# Patient Record
Sex: Female | Born: 1945 | Race: White | Hispanic: No | Smoking: Current every day smoker
Health system: Southern US, Community
[De-identification: ages and names within clinical notes are randomized; demographics above are authoritative.]

## PROBLEM LIST (undated history)

## (undated) DIAGNOSIS — M1712 Unilateral primary osteoarthritis, left knee: Secondary | ICD-10-CM

## (undated) DIAGNOSIS — Z720 Tobacco use: Secondary | ICD-10-CM

## (undated) DIAGNOSIS — I10 Essential (primary) hypertension: Secondary | ICD-10-CM

## (undated) DIAGNOSIS — E7800 Pure hypercholesterolemia, unspecified: Secondary | ICD-10-CM

## (undated) DIAGNOSIS — G8929 Other chronic pain: Secondary | ICD-10-CM

## (undated) DIAGNOSIS — M549 Dorsalgia, unspecified: Secondary | ICD-10-CM

## (undated) DIAGNOSIS — K219 Gastro-esophageal reflux disease without esophagitis: Secondary | ICD-10-CM

## (undated) DIAGNOSIS — E785 Hyperlipidemia, unspecified: Secondary | ICD-10-CM

## (undated) DIAGNOSIS — R002 Palpitations: Secondary | ICD-10-CM

## (undated) DIAGNOSIS — I739 Peripheral vascular disease, unspecified: Secondary | ICD-10-CM

## (undated) HISTORY — DX: Hyperlipidemia, unspecified: E78.5

## (undated) HISTORY — DX: Peripheral vascular disease, unspecified: I73.9

## (undated) HISTORY — DX: Gastro-esophageal reflux disease without esophagitis: K21.9

## (undated) HISTORY — DX: Tobacco use: Z72.0

## (undated) HISTORY — DX: Other chronic pain: G89.29

## (undated) HISTORY — DX: Palpitations: R00.2

## (undated) HISTORY — DX: Unilateral primary osteoarthritis, left knee: M17.12

## (undated) HISTORY — DX: Essential (primary) hypertension: I10

## (undated) HISTORY — DX: Dorsalgia, unspecified: M54.9

## (undated) HISTORY — PX: FEMORAL-POPLITEAL BYPASS GRAFT: SHX937

## (undated) HISTORY — DX: Pure hypercholesterolemia, unspecified: E78.00

---

## 1986-04-01 HISTORY — PX: FINGER TENDON REPAIR: SHX1640

## 1994-04-01 HISTORY — PX: KNEE ARTHROSCOPY: SUR90

## 1996-04-01 HISTORY — PX: LUMBAR FUSION: SHX111

## 1997-09-08 ENCOUNTER — Encounter: Admission: RE | Admit: 1997-09-08 | Discharge: 1997-12-07 | Payer: Self-pay

## 1997-11-17 ENCOUNTER — Ambulatory Visit (HOSPITAL_COMMUNITY): Admission: RE | Admit: 1997-11-17 | Discharge: 1997-11-17 | Payer: Self-pay | Admitting: *Deleted

## 2000-06-11 ENCOUNTER — Encounter: Payer: Self-pay | Admitting: Vascular Surgery

## 2000-06-13 ENCOUNTER — Ambulatory Visit (HOSPITAL_COMMUNITY): Admission: RE | Admit: 2000-06-13 | Discharge: 2000-06-13 | Payer: Self-pay | Admitting: Vascular Surgery

## 2000-06-13 HISTORY — PX: OTHER SURGICAL HISTORY: SHX169

## 2000-06-25 ENCOUNTER — Encounter (INDEPENDENT_AMBULATORY_CARE_PROVIDER_SITE_OTHER): Payer: Self-pay | Admitting: Specialist

## 2000-06-25 ENCOUNTER — Inpatient Hospital Stay (HOSPITAL_COMMUNITY): Admission: RE | Admit: 2000-06-25 | Discharge: 2000-06-29 | Payer: Self-pay | Admitting: Vascular Surgery

## 2000-06-25 ENCOUNTER — Encounter: Payer: Self-pay | Admitting: Vascular Surgery

## 2000-06-26 ENCOUNTER — Encounter: Payer: Self-pay | Admitting: Vascular Surgery

## 2001-08-13 ENCOUNTER — Ambulatory Visit (HOSPITAL_COMMUNITY): Admission: RE | Admit: 2001-08-13 | Discharge: 2001-08-13 | Payer: Self-pay | Admitting: Orthopaedic Surgery

## 2003-09-19 ENCOUNTER — Ambulatory Visit (HOSPITAL_COMMUNITY): Admission: RE | Admit: 2003-09-19 | Discharge: 2003-09-19 | Payer: Self-pay | Admitting: Orthopaedic Surgery

## 2003-10-10 ENCOUNTER — Encounter: Admission: RE | Admit: 2003-10-10 | Discharge: 2003-10-10 | Payer: Self-pay | Admitting: Orthopaedic Surgery

## 2003-10-14 ENCOUNTER — Ambulatory Visit (HOSPITAL_COMMUNITY): Admission: RE | Admit: 2003-10-14 | Discharge: 2003-10-14 | Payer: Self-pay | Admitting: Orthopaedic Surgery

## 2003-12-19 ENCOUNTER — Ambulatory Visit (HOSPITAL_COMMUNITY): Admission: RE | Admit: 2003-12-19 | Discharge: 2003-12-20 | Payer: Self-pay | Admitting: Orthopaedic Surgery

## 2004-03-05 ENCOUNTER — Ambulatory Visit (HOSPITAL_COMMUNITY): Admission: RE | Admit: 2004-03-05 | Discharge: 2004-03-05 | Payer: Self-pay | Admitting: Orthopaedic Surgery

## 2004-03-20 ENCOUNTER — Ambulatory Visit (HOSPITAL_COMMUNITY): Admission: RE | Admit: 2004-03-20 | Discharge: 2004-03-20 | Payer: Self-pay | Admitting: Orthopaedic Surgery

## 2004-03-30 ENCOUNTER — Inpatient Hospital Stay (HOSPITAL_COMMUNITY): Admission: RE | Admit: 2004-03-30 | Discharge: 2004-04-02 | Payer: Self-pay | Admitting: Orthopaedic Surgery

## 2004-04-30 ENCOUNTER — Ambulatory Visit: Payer: Self-pay | Admitting: Family Medicine

## 2004-05-31 ENCOUNTER — Encounter: Admission: RE | Admit: 2004-05-31 | Discharge: 2004-06-07 | Payer: Self-pay | Admitting: Orthopaedic Surgery

## 2004-10-10 ENCOUNTER — Ambulatory Visit: Payer: Self-pay | Admitting: Family Medicine

## 2005-02-11 ENCOUNTER — Ambulatory Visit: Payer: Self-pay | Admitting: Family Medicine

## 2005-05-22 ENCOUNTER — Ambulatory Visit: Payer: Self-pay | Admitting: Cardiology

## 2005-06-11 ENCOUNTER — Ambulatory Visit: Payer: Self-pay | Admitting: Family Medicine

## 2005-07-24 ENCOUNTER — Ambulatory Visit: Payer: Self-pay | Admitting: Family Medicine

## 2005-12-09 ENCOUNTER — Ambulatory Visit: Payer: Self-pay | Admitting: Family Medicine

## 2006-02-25 ENCOUNTER — Encounter: Admission: RE | Admit: 2006-02-25 | Discharge: 2006-02-25 | Payer: Self-pay | Admitting: Orthopaedic Surgery

## 2006-03-31 ENCOUNTER — Encounter: Admission: RE | Admit: 2006-03-31 | Discharge: 2006-03-31 | Payer: Self-pay | Admitting: *Deleted

## 2006-04-21 ENCOUNTER — Encounter: Admission: RE | Admit: 2006-04-21 | Discharge: 2006-04-21 | Payer: Self-pay | Admitting: Orthopaedic Surgery

## 2007-09-07 ENCOUNTER — Encounter: Admission: RE | Admit: 2007-09-07 | Discharge: 2007-09-07 | Payer: Self-pay | Admitting: Orthopaedic Surgery

## 2008-02-05 ENCOUNTER — Inpatient Hospital Stay (HOSPITAL_COMMUNITY): Admission: RE | Admit: 2008-02-05 | Discharge: 2008-02-08 | Payer: Self-pay | Admitting: Orthopaedic Surgery

## 2008-02-23 ENCOUNTER — Encounter: Admission: RE | Admit: 2008-02-23 | Discharge: 2008-03-31 | Payer: Self-pay | Admitting: Orthopaedic Surgery

## 2009-05-16 ENCOUNTER — Encounter: Payer: Self-pay | Admitting: Cardiology

## 2009-05-16 ENCOUNTER — Ambulatory Visit: Payer: Self-pay | Admitting: Cardiology

## 2009-05-16 ENCOUNTER — Inpatient Hospital Stay (HOSPITAL_COMMUNITY): Admission: EM | Admit: 2009-05-16 | Discharge: 2009-05-17 | Payer: Self-pay | Admitting: Emergency Medicine

## 2009-05-23 ENCOUNTER — Encounter: Payer: Self-pay | Admitting: Cardiovascular Disease

## 2009-05-23 DIAGNOSIS — R0989 Other specified symptoms and signs involving the circulatory and respiratory systems: Secondary | ICD-10-CM

## 2009-05-24 ENCOUNTER — Ambulatory Visit: Payer: Self-pay | Admitting: Cardiovascular Disease

## 2009-05-24 ENCOUNTER — Ambulatory Visit: Payer: Self-pay

## 2009-05-25 ENCOUNTER — Telehealth: Payer: Self-pay | Admitting: Cardiovascular Disease

## 2009-06-16 DIAGNOSIS — I10 Essential (primary) hypertension: Secondary | ICD-10-CM | POA: Insufficient documentation

## 2009-06-16 DIAGNOSIS — K219 Gastro-esophageal reflux disease without esophagitis: Secondary | ICD-10-CM | POA: Insufficient documentation

## 2009-06-16 DIAGNOSIS — E7800 Pure hypercholesterolemia, unspecified: Secondary | ICD-10-CM

## 2009-06-16 DIAGNOSIS — M549 Dorsalgia, unspecified: Secondary | ICD-10-CM

## 2009-06-16 DIAGNOSIS — F17200 Nicotine dependence, unspecified, uncomplicated: Secondary | ICD-10-CM

## 2009-06-16 DIAGNOSIS — G8929 Other chronic pain: Secondary | ICD-10-CM | POA: Insufficient documentation

## 2009-06-16 DIAGNOSIS — I739 Peripheral vascular disease, unspecified: Secondary | ICD-10-CM | POA: Insufficient documentation

## 2009-06-16 DIAGNOSIS — E785 Hyperlipidemia, unspecified: Secondary | ICD-10-CM

## 2009-06-21 ENCOUNTER — Ambulatory Visit: Payer: Self-pay | Admitting: Cardiology

## 2009-06-21 DIAGNOSIS — R002 Palpitations: Secondary | ICD-10-CM | POA: Insufficient documentation

## 2009-07-03 ENCOUNTER — Telehealth (INDEPENDENT_AMBULATORY_CARE_PROVIDER_SITE_OTHER): Payer: Self-pay | Admitting: *Deleted

## 2010-01-24 ENCOUNTER — Ambulatory Visit: Payer: Self-pay | Admitting: Cardiology

## 2010-05-03 NOTE — Progress Notes (Signed)
  Phone Note Other Incoming   Request: Send information Summary of Call: Request received from [employer] forwarded to [employer].       

## 2010-05-03 NOTE — Miscellaneous (Signed)
Summary: Orders Update  Clinical Lists Changes  Problems: Added new problem of CAROTID BRUIT (ICD-785.9) Orders: Added new Test order of Carotid Duplex (Carotid Duplex) - Signed 

## 2010-05-03 NOTE — Progress Notes (Signed)
Summary: Referral to Dr. Stevphen Rochester at [facility] Call   Summary of Call: Received flag from Dr. Clifton James that pt would need referral to Dr. Stevphen Rochester at [facility] for her carotid disease. Per flag from Dr. Clifton James he has spoken with pt regarding this.  Called to let pt know that referral is in progress.  Left message for pt to call back Initial call taken by: Dossie Arbour, RN, BSN,  May 25, 2009 3:45 PM     Appended Document: Referral to Dr. Stevphen Rochester at [facility] requires that referring MD's office call and make appt.   Appt made for March 14 at 9:30 (pt needs to arrive at 9:15).  [facility] will send a card to pt with appt time.  Message left earlier for pt to call back.  Appended Document: Referral to Dr. Stevphen Rochester at [facility] pt. also needs to keep appt with Dr. Antoine Poche in [place] on March 23 at 2:45  Appended Document: Referral to Dr. Stevphen Rochester at [facility] left message to call back  Appended Document: Referral to Dr. Stevphen Rochester at [facility]. notified of both appointments

## 2010-05-03 NOTE — Assessment & Plan Note (Signed)
Summary: [facility]   Visit Type:  Follow-up Primary Provider:  Ardeen Garland, MD  CC:  PVD and Chest pain.  History of Present Illness: The patient presents for followup after a hospitalization for chest pain. She was found to have nonobstructive disease. She did have ectopy on a monitor and was sent home with an event monitor. She also had a carotid bruit and had followup Dopplers. This suggested 80-90% stenosis. However, she was later seen at [facility] and this was found only to be 60% and is being followed up. Her monitor demonstrated some bradycardic rhythms but no symptomatic sustained bradycardia or tachycardia arrhythmias. She's had a few palpitations but nothing particularly bothersome. She was told to stop beta blocker which had been started in the hospital.  Since being in the hospital she has had no further chest discomfort, neck or arm discomfort. She has had no new shortness of breath, PND or orthopnea. She has started eating better and has stopped eating sausage biscuits. She is down to 3 cigarettes per day. She is wearing a nicotine patch.  Current Medications (verified): 1)  Aspirin 81 Mg  Tabs (Aspirin) .Marland Kitchen.. 1 By Mouth Daily 2)  Fish Oil   Oil (Fish Oil) .Marland Kitchen.. 1 By Mouth Daily 3)  Hydrochlorothiazide 25 Mg Tabs (Hydrochlorothiazide) .Marland Kitchen.. 1  By Mouth Daily 4)  Lisinopril 10 Mg Tabs (Lisinopril) .Marland Kitchen.. 1 By Mouth Daily 5)  Simvastatin 40 Mg Tabs (Simvastatin) .Marland Kitchen.. 1 By Mouth Daily  Past History:  Past Medical History:  1. Hypertension.   2. Hyperlipidemia.   3. Femoropopliteal bypass.   4. Peripheral vascular disease.   (carotid artery stenosis)  5. Hypercholesteremia.   6. Tobacco abuse.   7. Chronic back pain.   8. GERD.   9. 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      Review of Systems       As stated in the HPI and negative for all other systems.   Vital Signs:  Patient profile:   65 year old  female Height:      68 inches Weight:      156 pounds BMI:     23.81 Pulse rate:   70 / minute Resp:     16 per minute BP sitting:   124 / 66  (right arm)  Vitals Entered By: Marrion Coy, CNA (June 21, 2009 2:47 PM)  Physical Exam  General:  Well developed, well nourished, in no acute distress. Head:  normocephalic and atraumatic Eyes:  PERRLA/EOM intact; conjunctiva and lids normal. Neck:  Neck supple, no JVD. No masses, thyromegaly or abnormal cervical nodes. Chest Wall:  no deformities or breast masses noted Lungs:  Clear bilaterally to auscultation and percussion. Abdomen:  Bowel sounds positive; abdomen soft and non-tender without masses, organomegaly, or hernias noted. No hepatosplenomegaly. Msk:  Back normal, normal gait. Muscle strength and tone normal. Extremities:  No clubbing or cyanosis. Neurologic:  Alert and oriented x 3. Skin:  Intact without lesions or rashes. Cervical Nodes:  no significant adenopathy Axillary Nodes:  no significant adenopathy Inguinal Nodes:  no significant adenopathy Psych:  Normal affect.   Detailed Cardiovascular Exam  Neck    Carotids: Carotids full and equal bilaterally, left carotid bruit    Neck Veins: Normal, no JVD.    Heart    Inspection: no deformities or lifts noted.      Palpation: normal PMI with no thrills palpable.  Auscultation: regular rate and rhythm, S1, S2 without murmurs, rubs, gallops, or clicks.    Vascular    Abdominal Aorta: no palpable masses, pulsations, or audible bruits.      Femoral Pulses: normal femoral pulses bilaterally, femoral bruit    Pedal Pulses: diminished lower extremity pulses left greater than right    Radial Pulses: normal radial pulses bilaterally.      Peripheral Circulation: no clubbing, cyanosis, or edema noted with normal capillary refill.     EKG  Procedure date:  06/21/2009  Findings:      sinus rhythm, rate 70, axis within normal limits, intervals within normal limits, no  acute ST-T wave changes  Impression & Recommendations:  Problem # 1:  PALPITATIONS (ICD-785.1) I did review her monitor results. She had no tachycardia palpitations though she had some bradycardic rhythms. She's not having any symptoms. No further evaluation is warranted.  Problem # 2:  TOBACCO ABUSE (ICD-305.1) I applaud the fact that she is down to 3 cigarettes daily. We discussed complete cessation of smoking. I suggested the electronic cigarette to go along with the nicotine patches.  Problem # 3:  HYPERCHOLESTEROLEMIA (ICD-272.0) I reviewed the lipid profile done in the hospital with triglycerides 151, LDL 96 and HDL 35. I prescribed exercise. Her diet is better now. She will have the lipid profile repeated in August with a goal LDL less than 70 and HDL greater than 50. For now she'll continue the current simvastatin dose.  Problem # 4:  PVD (ICD-443.9) This will be followed at [facility]  Problem # 5:  HYPERTENSION (ICD-401.9) Her blood pressure is controlled and she will continue the meds as above.  Other Orders: EKG w/ Interpretation (93000)  Patient Instructions: 1)  Your physician recommends that you schedule a follow-up appointment in: 6 months with Dr Kirtland Bouchard 2)  Your physician recommends that you continue on your current medications as directed. Please refer to the Current Medication list given to you today.

## 2010-05-03 NOTE — Assessment & Plan Note (Signed)
Summary: [name]   Visit Type:  Follow-up Primary Provider:  Ardeen Garland, MD  CC:  PVD and palpitations.  History of Present Illness: The patient presents for followup of the above problems. Since I last saw her she has had no new cardiovascular complaints. She continues to get palpitations at night when she lies flat. She may wake up with these and feels somewhat patent. She will go out to smoke cigarettes and drink alcohol. She has had no presyncope or syncope. She has had no chest pressure, neck or arm discomfort. She has had no new shortness of breath, PND or orthopnea. She is limited in activities because of leg pain which she has accredited to back and knee problems. She's had no weight gain, swelling or edema.  Current Medications (verified): 1)  Aspirin 81 Mg  Tabs (Aspirin) .Marland Kitchen.. 1 By Mouth Daily 2)  Fish Oil   Oil (Fish Oil) .Marland Kitchen.. 1 By Mouth Daily 3)  Hydrochlorothiazide 25 Mg Tabs (Hydrochlorothiazide) .Marland Kitchen.. 1  By Mouth Daily 4)  Lisinopril 10 Mg Tabs (Lisinopril) .Marland Kitchen.. 1 By Mouth Daily 5)  Simvastatin 40 Mg Tabs (Simvastatin) .Marland Kitchen.. 1 By Mouth Daily 6)  Citalopram Hydrobromide 40 Mg Tabs (Citalopram Hydrobromide) .Marland Kitchen.. 1 By Mouth Daily  Allergies (verified): No Known Drug Allergies  Past History:  Past Medical History:  1. Hypertension.   2. Hyperlipidemia.   3. Palpitations  4. Peripheral vascular disease  5. Hypercholesteremia.   6. Tobacco abuse.   7. Chronic back pain.   8. GERD.   9. 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 Status post lumbar fusion in 1998 by Dr. Jacqulyn Bath.  2. Status post angiogram on June 13, 2000 by Dr. Hart Rochester.  3. Status post left knee arthroscopy in 1996.  4. Status post right mallet finger tendon repair in 1988.  5. Fempop bypass  Review of Systems       As stated in the HPI and negative for all other systems.   Vital Signs:  Patient profile:   65 year old female Height:      68 inches Weight:      161  pounds BMI:     24.57 Pulse rate:   72 / minute Resp:     18 per minute BP sitting:   126 / 68  (right arm)  Vitals Entered By: Marrion Coy, CNA (January 24, 2010 11:56 AM)  Physical Exam  General:  Well developed, well nourished, in no acute distress. Head:  normocephalic and atraumatic Eyes:  PERRLA/EOM intact; conjunctiva and lids normal. Mouth:  Dentures. Oral mucosa normal. Neck:  Neck supple, no JVD. No masses, thyromegaly or abnormal cervical nodes. Chest Wall:  no deformities or breast masses noted Lungs:  Clear bilaterally to auscultation and percussion. Abdomen:  Bowel sounds positive; abdomen soft and non-tender without masses, organomegaly, or hernias noted. No hepatosplenomegaly. Msk:  Back normal, normal gait. Muscle strength and tone normal. Extremities:  No clubbing or cyanosis. Neurologic:  Alert and oriented x 3. Skin:  Intact without lesions or rashes. Cervical Nodes:  no significant adenopathy Inguinal Nodes:  no significant adenopathy Psych:  Normal affect.   Detailed Cardiovascular Exam  Neck    Carotids: Carotids full and equal bilaterally, left carotid bruit    Neck Veins: Normal, no JVD.    Heart    Inspection: no deformities or lifts noted.      Palpation: normal PMI with no thrills palpable.      Auscultation:  regular rate and rhythm, S1, S2 without murmurs, rubs, gallops, or clicks.    Vascular    Abdominal Aorta: no palpable masses, pulsations, or audible bruits.      Femoral Pulses: normal femoral pulses bilaterally, femoral bruit    Pedal Pulses: diminished lower extremity pulses left greater than right    Radial Pulses: normal radial pulses bilaterally.      Peripheral Circulation: no clubbing, cyanosis, or edema noted with normal capillary refill.     EKG  Procedure date:  01/24/2010  Findings:      Sinus rhythm, rate 70, axis within normal limits, rolled within normal limits, no acute ST-T wave changes  Impression &  Recommendations:  Problem # 1:  PALPITATIONS (ICD-785.1) Assessment Unchanged We discussed this. It seems to happen at night and could be related to anxiety. She's had bradycardia on telemetry before and hasn't tolerated beta blockers. Therefore, I am reluctant to try any other medications. She will discuss possible treatment of anxiety with her primary provider. Orders: EKG w/ Interpretation (93000)  Problem # 2:  TOBACCO ABUSE (ICD-305.1) We had a long discussion about the need to stop smoking (greater than 3 minutes). She is interested in using Chantix. We discussed at length risks including depression, suicidal ideation and myocardial infarction. She understands these risks waiting and continued smoking and chooses Chantix. She has no ongoing depression or suicidal thought. I have given her a prescription.  Problem # 3:  PVD (ICD-443.9) She had a 60% blockage on carotid Doppler done at [facility]. She will continue to follow there. She understands she needs risk reduction.  Problem # 4:  HYPERTENSION (ICD-401.9) Her blood pressure is controlled. She will continue meds as listed.  Problem # 5:  HYPERCHOLESTEROLEMIA (ICD-272.0) Her lipids are followed by her primary physician. The goal should be an LDL less than 70 and HDL greater than 50.  Patient Instructions: 1)  Your physician recommends that you schedule a follow-up appointment in: 1 yr in the [place] office with Dr Antoine Poche 2)  Your physician recommends that you continue on your current medications as directed. Please refer to the Current Medication list given to you today. Prescriptions: CHANTIX 1 MG TABS (VARENICLINE TARTRATE) one twice a day after finishing starting dose pack  #60 x 3   Entered by:   Charolotte Capuchin, RN   Authorized by:   Rollene Rotunda, MD, [facility]   Signed by:   Charolotte Capuchin, RN on 01/24/2010   Method used:   Electronically to        [facility]  [address] (retail)       [address]        [place], [place]  [address]       Ph: [phone]       Fax: [phone]   RxID:   [id number] CHANTIX STARTING MONTH PAK 0.5 MG X 11 & 1 MG X 42 TABS (VARENICLINE TARTRATE) as directed  #1 x 0   Entered by:   Charolotte Capuchin, RN   Authorized by:   Rollene Rotunda, MD, [facility]   Signed by:   Charolotte Capuchin, RN on 01/24/2010   Method used:   Electronically to        [facility]  [address] (retail)       [address]       [place], [place]  [address]  Ph: [phone]       Fax: [phone]   RxID:   [id number]  I have reviewed and approved all prescriptions at the time of this visit. Rollene Rotunda, MD, [facility]  January 24, 2010 1:10 PM

## 2010-06-20 LAB — BRAIN NATRIURETIC PEPTIDE: Pro B Natriuretic peptide (BNP): 33 pg/mL (ref 0.0–100.0)

## 2010-06-20 LAB — PROTIME-INR
INR: 1.02 (ref 0.00–1.49)
INR: 1.05 (ref 0.00–1.49)
Prothrombin Time: 13.3 seconds (ref 11.6–15.2)
Prothrombin Time: 13.6 seconds (ref 11.6–15.2)

## 2010-06-20 LAB — COMPREHENSIVE METABOLIC PANEL
ALT: 18 U/L (ref 0–35)
ALT: 18 U/L (ref 0–35)
AST: 20 U/L (ref 0–37)
AST: 22 U/L (ref 0–37)
Albumin: 3.8 g/dL (ref 3.5–5.2)
Alkaline Phosphatase: 88 U/L (ref 39–117)
Alkaline Phosphatase: 97 U/L (ref 39–117)
BUN: 9 mg/dL (ref 6–23)
CO2: 27 mEq/L (ref 19–32)
CO2: 27 mEq/L (ref 19–32)
Calcium: 8.8 mg/dL (ref 8.4–10.5)
Calcium: 9.5 mg/dL (ref 8.4–10.5)
Chloride: 105 mEq/L (ref 96–112)
Creatinine, Ser: 0.77 mg/dL (ref 0.4–1.2)
GFR calc Af Amer: >60 mL/min (ref >60)
GFR calc Af Amer: >60 mL/min (ref >60)
GFR calc non Af Amer: >60 mL/min (ref >60)
GFR calc non Af Amer: >60 mL/min (ref >60)
Glucose, Bld: 107 mg/dL — ABNORMAL HIGH (ref 70–99)
Glucose, Bld: 108 mg/dL — ABNORMAL HIGH (ref 70–99)
Potassium: 3.4 mEq/L — ABNORMAL LOW (ref 3.5–5.1)
Potassium: 3.7 mEq/L (ref 3.5–5.1)
Sodium: 138 mEq/L (ref 135–145)
Sodium: 142 mEq/L (ref 135–145)
Total Bilirubin: 0.4 mg/dL (ref 0.3–1.2)
Total Protein: 7.5 g/dL (ref 6.0–8.3)

## 2010-06-20 LAB — LIPID PANEL
HDL: 35 mg/dL — ABNORMAL LOW (ref >39)
Triglycerides: 151 mg/dL — ABNORMAL HIGH (ref <150)
VLDL: 30 mg/dL (ref 0–40)

## 2010-06-20 LAB — HEPARIN LEVEL (UNFRACTIONATED)
Heparin Unfractionated: 0.18 IU/mL — ABNORMAL LOW (ref 0.30–0.70)
Heparin Unfractionated: 0.56 IU/mL (ref 0.30–0.70)

## 2010-06-20 LAB — CK TOTAL AND CKMB (NOT AT ARMC)
CK, MB: 1.8 ng/mL (ref 0.3–4.0)
Relative Index: INVALID (ref 0.0–2.5)
Total CK: 97 U/L (ref 7–177)

## 2010-06-20 LAB — HEMOGLOBIN A1C
Hgb A1c MFr Bld: 5.7 % (ref 4.6–6.1)
Mean Plasma Glucose: 117 mg/dL

## 2010-06-20 LAB — TROPONIN I: Troponin I: 0.01 ng/mL (ref 0.00–0.06)

## 2010-06-20 LAB — CBC
Hemoglobin: 12.5 g/dL (ref 12.0–15.0)
MCHC: 34.5 g/dL (ref 30.0–36.0)
RBC: 4.05 MIL/uL (ref 3.87–5.11)
WBC: 6.8 10*3/uL (ref 4.0–10.5)

## 2010-06-20 LAB — CARDIAC PANEL(CRET KIN+CKTOT+MB+TROPI)
CK, MB: 1.5 ng/mL (ref 0.3–4.0)
Relative Index: 1.5 (ref 0.0–2.5)
Total CK: 103 U/L (ref 7–177)

## 2010-06-20 LAB — APTT: aPTT: 28 seconds (ref 24–37)

## 2010-06-20 LAB — TSH: TSH: 1.041 u[IU]/mL (ref 0.350–4.500)

## 2010-06-20 LAB — MAGNESIUM: Magnesium: 1.9 mg/dL (ref 1.5–2.5)

## 2010-08-14 NOTE — Op Note (Signed)
[name], [name]              ACCOUNT NO.:  [id number]   MEDICAL RECORD NO.:  [id number]          PATIENT TYPE:  INP   LOCATION:  5041                         FACILITY:  [facility]   PHYSICIAN:  Mark C. Ophelia Charter, M.D.    DATE OF BIRTH:  05-31-1945   DATE OF PROCEDURE:  02/05/2008  DATE OF DISCHARGE:                               OPERATIVE REPORT   PREOPERATIVE DIAGNOSIS:  Left knee osteoarthritis.   POSTOPERATIVE DIAGNOSIS:  Left knee osteoarthritis.   PROCEDURE:  Left total knee arthroplasty, cemented, computer assist.   SURGEON:  Annell Greening, MD   ASSISTANT:  Wende Neighbors, PA-C   ANESTHESIA:  GOT plus Marcaine local.   TOURNIQUET TIME:  1 hour and 5 minutes.   PROCEDURE:  After general anesthesia orotracheal intubation,  preoperative Ancef prophylaxis, standard prepping and draping with Foley  insertion, bladder post, heel bump, proximal thigh tourniquet, and  DuraPrep, Impervious stockinette, Coban, total knee sheets, drapes,  split sheets, sterile skin marker, and Betadine Vi-Drape were applied.  Surgical time-out procedure was completed at this time.  Leg was wrapped  with an Esmarch.  Tourniquet inflated to 350.  At the end of the case,  tourniquet was deflated prior to closure after the components were  cemented.  Midline incision was made.  Superficial retinaculum was  divided.  True retinaculum was divided with a medial parapatellar  incision.  Patella was flipped over and cut from facet-to-facet removing  10 mm of bone, incised for 38 mm and holes were drilled.  Pins were  placed through the computer and signed the incision on the femur and  used a #15 blade in the mid tibial surface, tightened down, and models  were created for the femur and tibia.  Size 3 on the femur was  appropriate, 9 mm bone was taken, appropriate cuts were made.  Initial  pulsation and confirmation at each step.  Tibia was a 2.5.  Flexion/extension gaps were less than a millimeter.  A 10-mm  spacer gave  excellent fit.  There is 1 degree of varus in overall alignment and this  was identical to preoperative alignment.  After pulsatile irrigation,  the tibia was cemented in first with the keel.  All meniscal remnants  had been removed, and once the tibia was down, the femur was also  cemented.  This was a Scientist, research (life sciences) with 10-mm poly spacer.  Poly spacer was inserted and patella was held with a clamp.  During  resection of the meniscus, a portion of the #15 scalpel blade at its  proximal and away from the blade had broken and AP and lateral x-ray was  taken to tip of the blade, it is not in the wound and alignment and  position looked good, although there was some rotation on the AP x-ray  since this was a portal intraop film.  Tourniquet was deflated after  cement was hard for a total tourniquet time an 1 hour and 5 minutes.  Hemostasis was obtained.  Layered closure with #1 Tycron to the deep  layer, 2-0 Vicryl to the superficial  layers,  superficial retinaculum, subcu and subcuticular skin closure.  Tincture of Benzoin, Marcaine infiltration, Steri-Strips.  Closure of  the tibial computer pin sites, postop dressing, and knee immobilizer.  Instrument count and needle count was correct.      Mark C. Ophelia Charter, M.D.  Electronically Signed     MCY/MEDQ  D:  02/05/2008  T:  02/05/2008  Job:  [id number]

## 2010-08-17 NOTE — Op Note (Signed)
[facility]  Patient:    [name], [name]                    MRN: [id number] Proc. Date: 06/25/00 Attending:  [name], M.D.                           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  OPERATION:  Aortobifemoral bypass graft using a 12 x 7 mm Hemashield Dacron graft with proximal aortic endarterectomy.  SURGEON:  [name], M.D.  FIRST ASSISTANT:  Lissa Merlin.  ANESTHESIA:  General endotracheal.  PROCEDURE:  The patient was taken to the operating room and placed in the supine position. At which time, satisfactory general endotracheal anesthesia was administered. The abdomen and groin were prepped with Betadine scrub and solution and draped in a routine sterile manner. Midline incision was made in the abdomen, carried down through subcutaneous tissue and linea alba using the Bovie. Peritoneal cavity was entered and thoroughly explored. The stomach, duodenum, small bowel, and colon were unremarkable. Liver was smooth. No palpable masses. Gallbladder appeared normal. No stones were palpable. Transverse colon was elevated. Intestines reflected to the right side. Retroperitoneum incised, exposing the aorta from the renal arteries to the bifurcation. There was diffuse atherosclerosis particularly in the distal aorta where there was heavily calcified plaque in the terminal aorta and particularly the origin of the left common iliac and extending down the right external iliac. Circumferential control of the aorta was obtained distal to the renal arteries. Longitudinal incisions were made in both groins and the common superficial and profunda femoris arteries dissected free, encircled with vessel loops. Retroperitoneal tunnels were created posterior to the ureters, 25 g of  mannitol was given intravenously, and the patient was then heparinized. Aorta was then occluded distal to the renal arteries and transected. Distal stump oversewn with two layers of 3-0 Prolene buttressing this with two strips of felt. Proximal aortic stump was endarterectomized but not down to the deep layer. A 12 x 7 mm Hemashield Dacron graft anastomosed end-to-end to the aortic stump using continuous 3-0 Prolene buttressing this with a strip of felt. This was checked for leaks, and none were present. ______  were delivered through the tunnels, and end-to-side anastomoses were done bilaterally to the common femoral arteries after spatulating the grafts appropriately. This was anastomosed with 5-0 Prolene. The right leg was opened initially followed by the left leg with no significant hypotension. Protamine was then given to reverse the heparin. Following adequate hemostasis, the groins were closed in layers of Vicryl and clips. Retroperitoneum closed with 2-0 chromic, linea alba with #1 Prolene, the skin with clips. Sterile dressing applied. The patient taken to the recovery room in satisfactory condition. The patient received no blood from the blood bank, no blood from the ______ saver. Had excellent urinary output and was stable hemodynamically. DD:  06/25/00 TD:  06/25/00 Job: [id number]

## 2010-08-17 NOTE — Discharge Summary (Signed)
[name], [name]              ACCOUNT NO.:  [id number]   MEDICAL RECORD NO.:  [id number]          PATIENT TYPE:  INP   LOCATION:  5041                         FACILITY:  [facility]   PHYSICIAN:  Mark C. Ophelia Charter, M.D.    DATE OF BIRTH:  1946-03-27   DATE OF ADMISSION:  02/05/2008  DATE OF DISCHARGE:  02/08/2008                               DISCHARGE SUMMARY   ADMISSION DIAGNOSES:  1. End-stage osteoarthritis of the left knee.  2. Hypercholesterolemia.  3. Hypertension.  4. Tobacco abuse.  5. Peripheral vascular disease, status post femoral-popliteal bypass.  6. Chronic back pain.  7. Gastroesophageal reflux disease.   DISCHARGE DIAGNOSES:  1. End-stage osteoarthritis of the left knee.  2. Hypercholesterolemia.  3. Hypertension.  4. Tobacco abuse.  5. Peripheral vascular disease, status post femoral-popliteal bypass.  6. Chronic back pain.  7. Gastroesophageal reflux disease.  8. Posthemorrhagic anemia.   PROCEDURE:  On February 05, 2008, the patient underwent left total knee  arthroplasty, computer assisted, performed by Dr. Ophelia Charter, assisted by  Maud Deed [facility], under general anesthesia.   CONSULTATIONS:  None.   BRIEF HISTORY:  [name] is a 65 year old female with chronic and  progressive left knee pain secondary to osteoarthritis.  She has  undergone a left knee arthroscopy which did show grade 4 chondromalacia  in September 2008.  She has been treated conservatively since that time  with anti-inflammatory medications and analgesics.  At this time, she  would like to proceed with a total knee arthroplasty and will be  admitted to do so by Dr. Ophelia Charter.   BRIEF HOSPITAL COURSE:  The patient tolerated the procedure under  general anesthesia without complications.  Postoperatively, she was  placed on Coumadin for DVT prophylaxis.  Adjustments in Coumadin dose  made according to daily pro-times by the pharmacist at [facility].  Dressing  changes done daily and wound healing without  difficulty.  CPM utilized  as well as physical therapy for ambulation and gait training,  weightbearing as tolerated.  Range of motion actively stretching and  strengthening exercises as well.  Prior to discharge, the patient was  ambulating over 250 feet and was felt to be safe from a standpoint of  activity for discharge to her home with arrangements for home health  physical therapy.  The patient initially utilized PCA analgesics without  difficulty and was progressed to p.o. analgesics.  Foley catheter was  discontinued and she was able to void without difficulty.  The patient's  hemoglobin/hematocrit postop dropped to 10.5 and 30.2 respectively.  INR  at discharge was noted to be 2.9.  Chemistry studies on admission were  within normal limits and repeat on postop day #1 was within normal  limits as well.  EKG on admission, marked sinus bradycardia with heart  rate decreased since last tracing confirmed by Dr. Domingo Sep.   CONDITION ON DISCHARGE:  Stable.   PLAN:  The patient was discharged to her home.  Arrangements made for  home health physical therapy.  She will continue to do range of motion  stretching and strengthening exercises as well.  She will  be allowed to  shower.  Dressing change to be done as needed.  The patient will resume  home medications as taken prior to admission and was given medication  reconciliation form with these instructions.  She will stop her 81 mg of  aspirin while taking the Coumadin.  She was given a prescription for  Tylox 1-2 every 4-6 hours as needed for pain and Coumadin per pharmacy  instruction.  She will follow up with Dr. Ophelia Charter in approximately 10-14  days.  All questions encouraged and answered.      Wende Neighbors, P.A.      Mark C. Ophelia Charter, M.D.  Electronically Signed    SMV/MEDQ  D:  04/21/2008  T:  04/21/2008  Job:  [id number]

## 2010-08-17 NOTE — Op Note (Signed)
[name], [name]              ACCOUNT NO.:  [id number]   MEDICAL RECORD NO.:  [id number]          PATIENT TYPE:  INP   LOCATION:  2899                         FACILITY:  [facility]   PHYSICIAN:  Mark C. Ophelia Charter, M.D.    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   PROCEDURES:  Left L4-5 transforaminal lumbar interbody fusion (TLIF) local  bone and left iliac crest bone graft.  Pedicle screw instrumentation 45 x  6.5 mm screws.   ESTIMATED BLOOD LOSS:  200 mL.   REPLACEMENT:  Two units of packed cells.   DESCRIPTION OF PROCEDURE:  After induction of general anesthesia and  preoperative Ancef as prophylaxis, the patient was placed on a Wilson frame  with careful padding.  The back was prepped with Duraprep.  After squaring  the area, Betadine and Vi-Drape was applied.  Usual laminectomy sheaths and  drapes.  A midline incision was made.  The patient had an old previous  transverse incision from L5-S1 decompression and fusion and also from her 4-  5 microdiskectomy.  This was on the left and the patient had recurrent disk  protrusion with foraminal stenosis and chronic left leg pain.  Myelogram CT  scan showed recurrent stenosis.   C-arm was placed underneath the table after being sterilely draped and used  for localized.  Confirmed the 4-5 level.  Decompression performed 4-5,  removing all of the lamina.  Care taken to take most of the lamina on the  right first due to the patient's previous scar tissue.  Irrigation was  performed.  Throughout the case dura was protected with patties.  There were  no dural tears during the procedure.   The foramina was enlarged.  There was  some bone facet overhanging on the left at L4-5 causing stenosis and bone  was removed out laterally all the way through the facet.  Some thrombin  spray was used for the bleeders from the nerve root above.  Microdiskectomy  was performed on the right at L4-5 first followed by repeat microdiskectomy  on the left, taking large chunks of disk, decompressing the disk beginning  with the 8 and progressing up to the 12 with use of curets, multiple large  pituitary, down-biting, angled curets straight curets.  The anterior annulus  was intact.  Fluoroscopy was used to check the appropriate resection of disk  material.  End plates were bleeding.  The left iliac crest was exposed and  some large chunks of bone were taken, trimmed down slightly and then  impacted and placed anteriorly.  Then the 12 mm TLIF spacer was placed  behind this.  On the right side, decortication of the lateral gutter and  transverse process was performed.  Chunks of cancellus bone were harvested  from the left iliac  crest and placed over on the right side in the lateral  gutter.  Remaining local bone was also packed from the laminectomy that had  been carefully cleaned of any remaining soft tissue.  Nerve roots were  checked.  Pedicle screws were placed.  On the upper right screw in the L4  pedicle, there was some difficulty.  Initially it was inferior to the  pedicle.  Due to tight spacer being placed, it changed angles slightly and  the pedicle had to be burred down slightly until it was well visualized and  the screw could be placed directly down the pedicle.  The pedicle was  carefully palpated and the nerve roots were inspected.  The nerve root was  intact.  No dural tears.  Lavage was used followed by closure of deep fascia  with 0 Vicryl and 2-  0 Vicryl was used in the subcutaneous tissue.  Skin staple closure.  Closure  of the iliac crest separate incision with 0 Vicryl, 2-0  Vicryl and skin  staple closure.  Marcaine infiltrated in the skin.  The patient was  transferred to recovery room in stable condition.  The instrument count and  needle count were correct.       MCY/MEDQ  D:  03/30/2004  T:  03/30/2004  Job:  [id number]

## 2010-08-17 NOTE — H&P (Signed)
. [facility]  Patient:    [name], [name]                       MRN: [id number] Adm. Date:  06/25/00 Attending:  [name], M.D. Dictator:   Marlowe Kays, P.A. CC:         Dr. Dewaine Conger             Dr. Ophelia Charter, [facility]                         History and Physical  DATE OF BIRTH: [birth year]  CHIEF COMPLAINT: Aortoiliac occlusive disease.  HISTORY OF PRESENT ILLNESS: [name] is a pleasant 65 year old white female referred by Dr. Ophelia Charter for evaluation of AIOD.  Over the last three years the patient has been experiencing right leg discomfort.  This initiated in her calf and foot; however, the last eight months these symptoms migrated to her hip and thigh with ambulation no greater than 20 feet.  Apparently these symptoms started shortly after her spinal fusion in 1998.  In her left calf she has minimal symptoms.  Doppler studies revealed right iliac occlusive disease, with monophasic flow throughout the right leg, with ankle pressure of 0.54 in the right and 0.88 in the left lower extremity.  The patient underwent angiogram on June 13, 2000 and this confirmed the diagnosis.  Dr. Hart Rochester recommended to proceed with aortofemoral bypass graft in order to relieve her symptoms, scheduled for June 25, 2000.  She has buttock pain and, as mentioned, she has right hip, thigh, calf, and foot pain.  She has minimal symptoms on the left.  No rest pain; however, at night she is awakened by this.  She denies any slow healing ulcers or gangrenous changes.  No ischemic changes.  No peripheral edema or decrease in temperature.  She has right toe numbness when walking, as well as some burning at the plantar area.  No shortness of breath or dyspnea on exertion after walking is noted.  No chest pain or palpitations are noted.  PAST MEDICAL HISTORY:  1. AIOD, claudication.  2. History of tobacco abuse.  3. Anxiety.  PAST SURGICAL HISTORY:  1. Status post lumbar fusion in 1998 by Dr. Jacqulyn Bath.  2. Status post angiogram on June 13, 2000 by Dr. Hart Rochester.  3. Status post left knee arthroscopy in 1996.  4. Status post right mallet finger tendon repair in 1988.  CURRENT MEDICATIONS:  1. Paxil 20 mg q.d.  2. Aspirin 325 mg q.d.  ALLERGIES/SENSITIVITIES: DARVOCET causes heartburn; otherwise, no known drug allergies.  REVIEW OF SYSTEMS: See HPI and Past Medical History for significant positives. No diabetes, kidney disease, or asthma.  FAMILY HISTORY: Mother alive with history of DJD.  Father deceased with history of heart disease at the age of 45.  A sister is alive with a history of DJD.  A brother is alive and well.  Grandfather died of cancer.  SOCIAL HISTORY: The patient is divorced and has two children.  She is a Production designer, theatre/television/film at [employer].  She has smoked one packs of cigarettes a day for the last 40 years.  She denies any significant amount of alcohol intake and only drinks on social occasions.  PHYSICAL EXAMINATION:  GENERAL: This is a well-developed, well-nourished 65 year old white female in no acute distress, alert and oriented x 3.  VITAL SIGNS: Blood pressure 140/70, pulse 64, respirations 18.  HEENT: Head normocephalic, atraumatic.  PERRLA.  EOMI.  Funduscopic examination within normal limits.  NECK: Supple.  No JVD, bruits, or lymphadenopathy.  CHEST: Symmetric inspirations.  Lungs clear to auscultation.  CARDIOVASCULAR: Regular rate and rhythm without murmurs, rubs, or gallops.  ABDOMEN: Soft, nontender.  Bowel sounds x 4.  No masses.  There is a soft bruit in the mid umbilical area as well as bilateral iliac bruits.  GU/RECTAL: Examinations are deferred.  EXTREMITIES: No clubbing, cyanosis, or edema.  Skin shows no ulcerations. Temperature decreased in right foot.  Peripheral pulses show carotids 2+ bilaterally, femoral 2+ on the left through the distal pulses and on the right femorals  1+ with bruit.  No popliteal, dorsalis pedis, and posterior tibial pulses palpable.  NEUROLOGIC: Nonfocal.  Gait steady.  DTRs 2+ bilaterally.  Muscle strength 5/5.  ASSESSMENT/PLAN: Aortoiliac occlusive disease, admitted for aortofemoral bypass graft by Dr. Hart Rochester on June 25, 2000.  Dr. Hart Rochester has seen and evaluated this patient prior to admission and has explained the risks and benefits involving the procedure, and the patient has agreed to continue. DD:  06/23/00 TD:  06/23/00 Job: [id number]

## 2010-08-17 NOTE — Discharge Summary (Signed)
[name], [name]              ACCOUNT NO.:  [id number]   MEDICAL RECORD NO.:  [id number]          PATIENT TYPE:  INP   LOCATION:  5039                         FACILITY:  [facility]   PHYSICIAN:  Mark C. Ophelia Charter, M.D.    DATE OF BIRTH:  05/12/1945   DATE OF ADMISSION:  03/30/2004  DATE OF DISCHARGE:  04/02/2004                                 DISCHARGE SUMMARY   FINAL DIAGNOSIS:  L4-5 recurrent herniated nucleus pulposus with stenosis.   PROCEDURE:  L4-5 T-lift fusion, pedicle screw instrumentation, bone graft.   This 65 year old female has a long history of back pain and L4-5  foraminotomies on December 19, 2003. 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 Jacqulyn Bath greater than five years ago. After a long discussion  with the patient concerning options it was recommended that repeat  decompression and fusion be performed, and she agreed. Informed consent was  obtained after extensive discussion of possible complications.   Preoperative labs showed hemoglobin of 13, hematocrit 38.2. Electrolytes  were normal. Urinalysis was clear.   HOSPITAL COURSE:  After informed consent the patient underwent L4-5 T-lift,  pedicle instrumentation, autograft, and micro diskectomy for recurrent HNP  with Dr. Doneen Poisson assisting. Anesthesia was general and  estimated blood loss was 2000 cc. Postoperatively the patient had intact  sensation and motor exam was normal. Hemoglobin dropped to 9.8 and she had a  total of 3 units given on the day of surgery. She had some oozing  postoperatively on the bleeding bone bed from the fusion site as expected.  She was using the PCA. Motor function was intact. Incision looked good and  she was discharged home. X-rays taken and  showed good position of the T-lift  and pedicle screws. She was ambulatory on a lumbar corset.   FINAL DIAGNOSIS:  Recurrent herniated nucleus pulposus, L4-5, with foraminal  stenosis and previous L5-S1 fusion.   ADDITIONAL DIAGNOSIS:  Postoperative anemia secondary to intraoperative  blood loss (hemoglobin 8.5).      MCY/MEDQ  D:  05/07/2004  T:  05/07/2004  Job:  [id number]

## 2010-08-17 NOTE — Op Note (Signed)
[name], [name]              ACCOUNT NO.:  [id number]   MEDICAL RECORD NO.:  [id number]          PATIENT TYPE:  OIB   LOCATION:  5015                         FACILITY:  [facility]   PHYSICIAN:  Mark C. Ophelia Charter, M.D.    DATE OF BIRTH:  03/21/1946   DATE OF PROCEDURE:  12/20/2003  DATE OF DISCHARGE:  12/20/2003                                 OPERATIVE REPORT   PREOPERATIVE DIAGNOSIS:  Left L4-5 foraminal stenosis.   POSTOPERATIVE DIAGNOSIS:  Left L4-5 foraminal stenosis.   OPERATION PERFORMED:  Left L4-5 foraminotomy and partial hemilaminectomy.   SURGEON:  Mark C. Ophelia Charter, M.D.   ASSISTANT:  Sandrea Matte, P.A.   ANESTHESIA:  GOT.   ESTIMATED BLOOD LOSS:  Less than 100 mL.   INDICATIONS FOR PROCEDURE:  This 65 year old female has had previous L5-S1  fusion by Dr. Kathrene Bongo with a transverse incision and has developed  foraminal stenosis at the level above on the left at L4-5.  She has had  previous injections which gave her only temporary relief.  Myelogram CT scan  showed hypertrophic bone related to fusion causing foraminal stenosis.   DESCRIPTION OF PROCEDURE:  After induction under general anesthesia, the  patient was placed in kneeling position on the [place] frame with  preoperative Ancef prophylaxis.  Doppler checks were made intraoperatively  to make sure pulses were good from the patient's previous aortobifemoral  bypass.  Needle localization after prepping, draping, squaring with towels,  Betadine Vi-drape, and laminectomy sheet was performed and the needle was  just above the level of 4-5 and was close enough to the old transverse  incision.  The old transverse incision was used.  Fascia was split  longitudinally.  Subperiosteal dissection and Taylor retractor was placed  laterally.  Inferior aspect of the lamina on the left at L4 was identified  and laminotomy was performed.  Operating microscope was draped and brought  in as the lamina was continued to be removed  proximally until the dura was  encountered and then thick chunks of ligament were removed from the lateral  gutter.  There was bone overgrowth off the facets causing severe foraminal  stenosis.  Due to the anterolisthesis at 5-1 and the previous fusion, the  listhesis caused additional traction on the central dura at the top of L5 on  the left.  The lamina of L5 was partially removed continuing down freeing it  up with a dural separator hockey stick.  The L4 nerve was identified on the  left and L5 nerve root was identified as well.  Foraminotomy was performed.  Disk was firm, was not causing any compression.  Palpation at the midline  showed no evidence of free fragments or compression.  Dura was intact after  irrigation with saline solution.  Fascia  was closed with 0 Vicryl, 2-0 Vicryl in the subcutaneous tissue and skin  staple closure in the transverse incision.  Postoperative dressing.  Patient  was transferred to supine, had normal pulses and was transferred to the  recovery room.       MCY/MEDQ  D:  12/20/2003  T:  12/20/2003  Job:  [id number]

## 2010-08-17 NOTE — Discharge Summary (Signed)
[place]  Patient:    [name], [name] Visit Number: [id number] MRN: [id number]          Service Type: Dictated by:   Lissa Hoard, P.A.C. Adm. Date:  06/25/00 Disc. Date: 06/29/00                             Discharge Summary  DATE OF BIRTH:  September 23, 1945.  ADMISSION DIAGNOSIS:  Aortoiliac occlusive disease.  SECONDARY DIAGNOSES: 1. Depression. 2. History of tobacco abuse.  DISCHARGE DIAGNOSIS:  Aortoiliac occlusive disease.  HOSPITAL PROCEDURES: 1. Aortobifemoral bypass graft. 2. Postoperative ankle brachial index.  HOSPITAL COURSE:  [name] was admitted to [facility] on June 25, 2000, secondary to aortoiliac occlusive disease.  Because of this, [name], M.D., performed an aortobifemoral bypass graft.  No complications were noted during this procedure.  The patient had an uneventful hospital course and she was discharged home in stable condition on June 29, 2000.  MEDICATIONS AT DISCHARGE: 1. Aspirin 325 mg one tablet daily. 2. Paxil 20 mg one tablet daily. 3. Tylox one to two tablets every four to six hours as needed for pain.  DISCHARGE ACTIVITY:  The patient was told to avoid driving or strenuous activity.  DISCHARGE DIET:  Low fat, low salt.  WOUND CARE:  The patient told she could shower and clean her incisions with soap and water.  DISPOSITION:  Home.  FOLLOW-UP:  The patient was instructed to follow up with Dr. Hart Rochester at the [facility] office in two weeks.  The office will call her to verify time and date of this appointment.  Dictated by:   Lissa Hoard, P.A.C. DD:  07/14/00 TD:  07/14/00 Job: [id number]

## 2010-08-17 NOTE — Op Note (Signed)
[place]. [facility]  Patient:    [name], [name]                    MRN: [id number] Proc. Date: 06/13/00 Attending:  [name], M.D. CC:         Catheterization Laboratory   Operative Report  PREOPERATIVE DIAGNOSIS:  Severe aortoiliac occlusive disease with limiting claudication, right leg worse than left.  POSTOPERATIVE DIAGNOSIS:  Severe aortoiliac occlusive disease with limiting claudication, right leg worse than left.  OPERATION:  Abdominal aortogram with bilateral lower extremity run off via right common femoral approach.  SURGEON:  [name], M.D.  ANESTHESIA:  Local with xylocaine and Versed 2 mg intravenously.  CONTRAST USED:  130 cc  COMPLICATIONS:  None.  DESCRIPTION OF PROCEDURE:  The patient was taken to the [facility] Lab and placed in the supine position, at which time both groins were prepped with Betadine solution and draped in a routine sterile manner. After infiltration with 1% xylocaine, the right common femoral artery was entered percutaneously.  Guide wire passed into the proximal common iliac artery where it met obstruction.  A 5 French sheath and dilator were passed over the guide wire and the standard guide wire replaced with a glide wire which traversed the lesion and did meet some obstruction in the terminal aorta but did traverse this area into the super renal aorta.  A standard pigtail catheter was passed over the glide wire.  The glide wire removed and flush abdominal aortogram was performed, injecting 20 cc of contrast at 20 cc per second.  This revealed the aorta to be small in caliber but patent with mild to moderate irregularity down to the terminal aorta.  There was a single renal artery bilaterally.  The left proximal renal artery had a 40 to 50% stenosis over about a 2 cm segment.  The right renal artery appeared normal.  The inferior mesenteric artery was patent as was the  superior mesenteric artery. There was severe stenosis in a small terminal aorta proximating 90% with diffuse plaque down the right external iliac system over a long segment with a tubular 75% stenosis.  There was a high bifurcation of the internal iliac artery on the left with a widely patent left external iliac system.  Both internal iliacs were widely patent.  The catheter was withdrawn into the terminal aorta and bilateral lower extremity angiography was performed with bilateral lower extremity run off injecting 88 cc of contrast at 8 cc per second.  This revealed both iliac systems to be as described above with diffuse disease of the right external iliac system and severe disease of the terminal aorta.  Both common femoral, superficial femoral and profundofemoral arteries were widely patent.  The popliteal arteries were patent bilaterally and there appeared to be normal run off, although there was some movement which obscured some of the tibial vessels during the later stages of the lower extremity run off.  Another view of the iliac vessels was also performed by injecting 20 cc of contrast at 20 cc per second.  Looking specifically at the terminal aorta and iliac arteries and this confirmed the above findings. Pigtail catheter was removed over the guide wire, the guide wire removed and the sheath then removed and adequate compression applied. No complications ensued.  FINDINGS: 1. 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 2. Severe aortoiliac occlusive disease with 90%  stenosis of terminal aorta    with small caliber aorta. 3. Diffuse right external iliac occlusive disease with tubular 70% stenosis. 4. Normal run off bilaterally with patent superficial femoral popliteal and    tibial vessels. DD:  06/13/00 TD:  06/13/00 Job: [id number]

## 2010-11-05 ENCOUNTER — Other Ambulatory Visit: Payer: Self-pay | Admitting: Orthopaedic Surgery

## 2010-11-05 ENCOUNTER — Ambulatory Visit
Admission: RE | Admit: 2010-11-05 | Discharge: 2010-11-05 | Disposition: A | Discharge status: Home / Self Care | Payer: 59 | Source: Ambulatory Visit | Attending: Orthopaedic Surgery | Admitting: Orthopaedic Surgery

## 2010-11-05 DIAGNOSIS — M48061 Spinal stenosis, lumbar region without neurogenic claudication: Secondary | ICD-10-CM

## 2010-11-06 ENCOUNTER — Encounter: Payer: Self-pay | Admitting: Cardiology

## 2010-11-14 ENCOUNTER — Other Ambulatory Visit: Payer: Self-pay | Admitting: Orthopaedic Surgery

## 2010-11-14 DIAGNOSIS — I739 Peripheral vascular disease, unspecified: Secondary | ICD-10-CM

## 2010-11-19 ENCOUNTER — Ambulatory Visit
Admission: RE | Admit: 2010-11-19 | Discharge: 2010-11-19 | Disposition: A | Discharge status: Home / Self Care | Payer: 59 | Source: Ambulatory Visit | Attending: Orthopaedic Surgery | Admitting: Orthopaedic Surgery

## 2010-11-19 DIAGNOSIS — I739 Peripheral vascular disease, unspecified: Secondary | ICD-10-CM

## 2011-01-01 LAB — BASIC METABOLIC PANEL
CO2: 28
Chloride: 101
GFR calc non Af Amer: >60
Glucose, Bld: 119 — ABNORMAL HIGH
Potassium: 3.9
Sodium: 135

## 2011-01-01 LAB — DIFFERENTIAL
Basophils Relative: 1
Eosinophils Absolute: 0.1
Eosinophils Relative: 1
Lymphs Abs: 2
Monocytes Absolute: 0.6
Monocytes Relative: 8

## 2011-01-01 LAB — CBC
HCT: 30.2 — ABNORMAL LOW
HCT: 32 — ABNORMAL LOW
Hemoglobin: 10.7 — ABNORMAL LOW
MCHC: 33.9
MCV: 88.8
MCV: 91.2
RBC: 3.4 — ABNORMAL LOW
RBC: 4.38
RDW: 13.6
RDW: 13.6
WBC: 8.8

## 2011-01-01 LAB — PROTIME-INR
INR: 0.9
INR: 2.9 — ABNORMAL HIGH

## 2011-01-01 LAB — COMPREHENSIVE METABOLIC PANEL
ALT: 18
AST: 24
Alkaline Phosphatase: 90
Calcium: 9.7
GFR calc Af Amer: >60
Potassium: 4.1
Sodium: 138
Total Protein: 7.2

## 2011-03-20 ENCOUNTER — Other Ambulatory Visit: Payer: Self-pay | Admitting: Orthopaedic Surgery

## 2011-03-20 DIAGNOSIS — M545 Low back pain: Secondary | ICD-10-CM

## 2011-04-01 ENCOUNTER — Ambulatory Visit
Admission: RE | Admit: 2011-04-01 | Discharge: 2011-04-01 | Disposition: A | Discharge status: Home / Self Care | Payer: 59 | Source: Ambulatory Visit | Attending: Orthopaedic Surgery | Admitting: Orthopaedic Surgery

## 2011-04-01 DIAGNOSIS — M545 Low back pain: Secondary | ICD-10-CM

## 2012-01-13 ENCOUNTER — Encounter (INDEPENDENT_AMBULATORY_CARE_PROVIDER_SITE_OTHER): Payer: Self-pay | Admitting: *Deleted

## 2012-04-24 DIAGNOSIS — M4317 Spondylolisthesis, lumbosacral region: Secondary | ICD-10-CM | POA: Insufficient documentation

## 2012-04-24 DIAGNOSIS — M5137 Other intervertebral disc degeneration, lumbosacral region: Secondary | ICD-10-CM | POA: Insufficient documentation

## 2012-04-24 DIAGNOSIS — M51379 Other intervertebral disc degeneration, lumbosacral region without mention of lumbar back pain or lower extremity pain: Secondary | ICD-10-CM | POA: Insufficient documentation

## 2012-04-24 DIAGNOSIS — M545 Low back pain: Secondary | ICD-10-CM | POA: Insufficient documentation

## 2012-06-11 DIAGNOSIS — M48062 Spinal stenosis, lumbar region with neurogenic claudication: Secondary | ICD-10-CM | POA: Insufficient documentation

## 2012-07-14 DIAGNOSIS — Z981 Arthrodesis status: Secondary | ICD-10-CM | POA: Insufficient documentation

## 2012-07-14 DIAGNOSIS — M542 Cervicalgia: Secondary | ICD-10-CM | POA: Insufficient documentation

## 2012-07-20 ENCOUNTER — Other Ambulatory Visit: Payer: Self-pay | Admitting: Nurse Practitioner

## 2012-08-07 ENCOUNTER — Encounter: Payer: Self-pay | Admitting: Nurse Practitioner

## 2012-08-07 ENCOUNTER — Ambulatory Visit (INDEPENDENT_AMBULATORY_CARE_PROVIDER_SITE_OTHER): Payer: Medicare Other | Admitting: Nurse Practitioner

## 2012-08-07 VITALS — BP 106/66 | HR 95 | Temp 97.1°F | Ht 68.0 in | Wt 151.0 lb

## 2012-08-07 DIAGNOSIS — F411 Generalized anxiety disorder: Secondary | ICD-10-CM

## 2012-08-07 DIAGNOSIS — F17200 Nicotine dependence, unspecified, uncomplicated: Secondary | ICD-10-CM

## 2012-08-07 DIAGNOSIS — E785 Hyperlipidemia, unspecified: Secondary | ICD-10-CM

## 2012-08-07 DIAGNOSIS — R7989 Other specified abnormal findings of blood chemistry: Secondary | ICD-10-CM

## 2012-08-07 DIAGNOSIS — I10 Essential (primary) hypertension: Secondary | ICD-10-CM

## 2012-08-07 MED ORDER — BUPROPION HCL ER (XL) 150 MG PO TB24: 150.0000 mg | ORAL_TABLET | Freq: Every day | ORAL | Status: DC

## 2012-08-07 MED ORDER — SERTRALINE HCL 50 MG PO TABS: 50.0000 mg | ORAL_TABLET | Freq: Every day | ORAL | Status: DC

## 2012-08-07 MED ORDER — LISINOPRIL 10 MG PO TABS: 10.0000 mg | ORAL_TABLET | Freq: Every day | ORAL | Status: DC

## 2012-08-07 MED ORDER — ROSUVASTATIN CALCIUM 10 MG PO TABS: 10.0000 mg | ORAL_TABLET | Freq: Every day | ORAL | Status: DC

## 2012-08-07 NOTE — Patient Instructions (Signed)
Smoking Cessation Quitting smoking is important to your health and has many advantages. However, it is not always easy to quit since nicotine is a very addictive drug. Often times, people try 3 times or more before being able to quit. This document explains the best ways for you to prepare to quit smoking. Quitting takes hard work and a lot of effort, but you can do it. ADVANTAGES OF QUITTING SMOKING  You will live longer, feel better, and live better.  Your body will feel the impact of quitting smoking almost immediately.  Within 20 minutes, blood pressure decreases. Your pulse returns to its normal level.  After 8 hours, carbon monoxide levels in the blood return to normal. Your oxygen level increases.  After 24 hours, the chance of having a heart attack starts to decrease. Your breath, hair, and body stop smelling like smoke.  After 48 hours, damaged nerve endings begin to recover. Your sense of taste and smell improve.  After 72 hours, the body is virtually free of nicotine. Your bronchial tubes relax and breathing becomes easier.  After 2 to 12 weeks, lungs can hold more air. Exercise becomes easier and circulation improves.  The risk of having a heart attack, stroke, cancer, or lung disease is greatly reduced.  After 1 year, the risk of coronary heart disease is cut in half.  After 5 years, the risk of stroke falls to the same as a nonsmoker.  After 10 years, the risk of lung cancer is cut in half and the risk of other cancers decreases significantly.  After 15 years, the risk of coronary heart disease drops, usually to the level of a nonsmoker.  If you are pregnant, quitting smoking will improve your chances of having a healthy baby.  The people you live with, especially any children, will be healthier.  You will have extra money to spend on things other than cigarettes. QUESTIONS TO THINK ABOUT BEFORE ATTEMPTING TO QUIT You may want to talk about your answers with your  caregiver.  Why do you want to quit?  If you tried to quit in the past, what helped and what did not?  What will be the most difficult situations for you after you quit? How will you plan to handle them?  Who can help you through the tough times? Your family? Friends? A caregiver?  What pleasures do you get from smoking? What ways can you still get pleasure if you quit? Here are some questions to ask your caregiver:  How can you help me to be successful at quitting?  What medicine do you think would be best for me and how should I take it?  What should I do if I need more help?  What is smoking withdrawal like? How can I get information on withdrawal? GET READY  Set a quit date.  Change your environment by getting rid of all cigarettes, ashtrays, matches, and lighters in your home, car, or work. Do not let people smoke in your home.  Review your past attempts to quit. Think about what worked and what did not. GET SUPPORT AND ENCOURAGEMENT You have a better chance of being successful if you have help. You can get support in many ways.  Tell your family, friends, and co-workers that you are going to quit and need their support. Ask them not to smoke around you.  Get individual, group, or telephone counseling and support. Programs are available at local hospitals and health centers. Call your local health department for   information about programs in your area.  Spiritual beliefs and practices may help some smokers quit.  Download a "quit meter" on your computer to keep track of quit statistics, such as how long you have gone without smoking, cigarettes not smoked, and money saved.  Get a self-help book about quitting smoking and staying off of tobacco. LEARN NEW SKILLS AND BEHAVIORS  Distract yourself from urges to smoke. Talk to someone, go for a walk, or occupy your time with a task.  Change your normal routine. Take a different route to work. Drink tea instead of coffee.  Eat breakfast in a different place.  Reduce your stress. Take a hot bath, exercise, or read a book.  Plan something enjoyable to do every day. Reward yourself for not smoking.  Explore interactive web-based programs that specialize in helping you quit. GET MEDICINE AND USE IT CORRECTLY Medicines can help you stop smoking and decrease the urge to smoke. Combining medicine with the above behavioral methods and support can greatly increase your chances of successfully quitting smoking.  Nicotine replacement therapy helps deliver nicotine to your body without the negative effects and risks of smoking. Nicotine replacement therapy includes nicotine gum, lozenges, inhalers, nasal sprays, and skin patches. Some may be available over-the-counter and others require a prescription.  Antidepressant medicine helps people abstain from smoking, but how this works is unknown. This medicine is available by prescription.  Nicotinic receptor partial agonist medicine simulates the effect of nicotine in your brain. This medicine is available by prescription. Ask your caregiver for advice about which medicines to use and how to use them based on your health history. Your caregiver will tell you what side effects to look out for if you choose to be on a medicine or therapy. Carefully read the information on the package. Do not use any other product containing nicotine while using a nicotine replacement product.  RELAPSE OR DIFFICULT SITUATIONS Most relapses occur within the first 3 months after quitting. Do not be discouraged if you start smoking again. Remember, most people try several times before finally quitting. You may have symptoms of withdrawal because your body is used to nicotine. You may crave cigarettes, be irritable, feel very hungry, cough often, get headaches, or have difficulty concentrating. The withdrawal symptoms are only temporary. They are strongest when you first quit, but they will go away within  10 14 days. To reduce the chances of relapse, try to:  Avoid drinking alcohol. Drinking lowers your chances of successfully quitting.  Reduce the amount of caffeine you consume. Once you quit smoking, the amount of caffeine in your body increases and can give you symptoms, such as a rapid heartbeat, sweating, and anxiety.  Avoid smokers because they can make you want to smoke.  Do not let weight gain distract you. Many smokers will gain weight when they quit, usually less than 10 pounds. Eat a healthy diet and stay active. You can always lose the weight gained after you quit.  Find ways to improve your mood other than smoking. FOR MORE INFORMATION  www.smokefree.gov  Document Released: 03/12/2001 Document Revised: 09/17/2011 Document Reviewed: 06/27/2011 [employer] Patient Information 2013 [employer].  

## 2012-08-07 NOTE — Progress Notes (Signed)
Subjective:    Patient ID: [name], female    DOB: 1946-02-10, 67 y.o.   MRN: [id number]  Hypertension This is a chronic problem. The current episode started more than 1 year ago. The problem is unchanged. The problem is controlled. Pertinent negatives include no anxiety, chest pain, headaches, palpitations, peripheral edema or shortness of breath. There are no associated agents to hypertension. Risk factors for coronary artery disease include dyslipidemia and post-menopausal state. Past treatments include ACE inhibitors and diuretics. The current treatment provides significant improvement. Compliance problems include diet and exercise.   Hyperlipidemia This is a chronic problem. The current episode started more than 1 year ago. The problem is uncontrolled. Recent lipid tests were reviewed and are high. There are no known factors aggravating her hyperlipidemia. Pertinent negatives include no chest pain, leg pain, myalgias or shortness of breath. Current antihyperlipidemic treatment includes statins. Risk factors for coronary artery disease include post-menopausal.  Nicotine Dependence Presents for follow-up visit. Symptoms are negative for irritability. Her urge triggers include company of smokers. The symptoms have been worsening. Her first smoke is before 6 AM. She smokes < 1/2 a pack (started using vapor cigarette) of cigarettes per day. Compliance with prior treatments has been poor (would like to try wellbutrin to se if it will help with smoking cessation.).  Anxiety Presents for follow-up visit. Symptoms include nervous/anxious behavior. Patient reports no chest pain, compulsions, depressed mood, dry mouth, irritability, malaise, nausea, palpitations or shortness of breath. Symptoms occur occasionally. The most recent episode lasted 5 minutes. The patient sleeps 6 hours per night. The quality of sleep is fair. Nighttime awakenings: occasional.   Compliance with medications is 76-100%  (increased celexa at last visit an dpatient can't tell where it is helping would like to try something else.).      Review of Systems  Constitutional: Negative for irritability.  Respiratory: Negative for shortness of breath.   Cardiovascular: Negative for chest pain and palpitations.  Gastrointestinal: Negative for nausea.  Musculoskeletal: Negative for myalgias.  Neurological: Negative for headaches.  Psychiatric/Behavioral: The patient is nervous/anxious.   All other systems reviewed and are negative.       Objective:   Physical Exam  Constitutional: She is oriented to person, place, and time. She appears well-developed and well-nourished.  HENT:  Nose: Nose normal.  Mouth/Throat: Oropharynx is clear and moist.  Eyes: EOM are normal.  Neck: Trachea normal, normal range of motion and full passive range of motion without pain. Neck supple. No JVD present. Carotid bruit is not present. No thyromegaly present.  Cardiovascular: Normal rate, regular rhythm, normal heart sounds and intact distal pulses.  Exam reveals no gallop and no friction rub.   No murmur heard. Pulmonary/Chest: Effort normal and breath sounds normal.  Abdominal: Soft. Bowel sounds are normal. She exhibits no distension and no mass. There is no tenderness.  Musculoskeletal: Normal range of motion.  Lymphadenopathy:    She has no cervical adenopathy.  Neurological: She is alert and oriented to person, place, and time. She has normal reflexes.  Skin: Skin is warm and dry.  Psychiatric: She has a normal mood and affect. Her behavior is normal. Judgment and thought content normal.    BP 106/66  Pulse 95  Temp(Src) 97.1 F (36.2 C) (Oral)  Ht 5\' 8"  (1.727 m)  Wt 151 lb (68.493 kg)  BMI 22.96 kg/m2       Assessment & Plan:  1. Hypertension Low NA+ diet - COMPLETE METABOLIC PANEL WITH GFR -  lisinopril (PRINIVIL,ZESTRIL) 10 MG tablet; Take 1 tablet (10 mg total) by mouth daily.  Dispense: 30 tablet;  Refill: 5  2. Hyperlipidemia **Low fat diet and exercise* - NMR Lipoprofile with Lipids - rosuvastatin (CRESTOR) 10 MG tablet; Take 1 tablet (10 mg total) by mouth daily.  Dispense: 30 tablet; Refill: 5  3. GAD (generalized anxiety disorder) Stress management Stop celexa - sertraline (ZOLOFT) 50 MG tablet; Take 1 tablet (50 mg total) by mouth daily.  Dispense: 30 tablet; Refill: 5  4. Nicotine dependence Decrease use of electronic cigarette as well as regular cigarettes - buPROPion (WELLBUTRIN XL) 150 MG 24 hr tablet; Take 1 tablet (150 mg total) by mouth daily.  Dispense: 30 tablet; Refill: 5   Patient refuses most of health maintenance Mary-Margaret Daphine Deutscher, [name]

## 2012-08-08 LAB — COMPLETE METABOLIC PANEL WITH GFR
ALT: 14 U/L (ref 0–35)
CO2: 28 mEq/L (ref 19–32)
Calcium: 9.5 mg/dL (ref 8.4–10.5)
Chloride: 102 mEq/L (ref 96–112)
GFR, Est African American: >89 mL/min
Sodium: 139 mEq/L (ref 135–145)
Total Bilirubin: 0.4 mg/dL (ref 0.3–1.2)
Total Protein: 6.9 g/dL (ref 6.0–8.3)

## 2012-08-11 LAB — NMR LIPOPROFILE WITH LIPIDS

## 2012-08-12 NOTE — Addendum Note (Signed)
Addended by: Orma Render F on: 08/12/2012 05:29 PM   Modules accepted: Orders

## 2012-08-12 NOTE — Addendum Note (Signed)
Addended by: Orma Render F on: 08/12/2012 05:27 PM   Modules accepted: Orders

## 2012-08-12 NOTE — Addendum Note (Signed)
Addended by: Orma Render F on: 08/12/2012 05:31 PM   Modules accepted: Orders

## 2012-08-13 ENCOUNTER — Other Ambulatory Visit (INDEPENDENT_AMBULATORY_CARE_PROVIDER_SITE_OTHER): Payer: Medicare Other

## 2012-08-13 DIAGNOSIS — E785 Hyperlipidemia, unspecified: Secondary | ICD-10-CM

## 2012-08-13 NOTE — Progress Notes (Signed)
Patient came in for labs only.

## 2012-08-19 LAB — NMR LIPOPROFILE WITH LIPIDS
Cholesterol, Total: 162 mg/dL (ref <200)
LDL Particle Number: 1197 nmol/L — ABNORMAL HIGH (ref <1000)
LP-IR Score: 67 — ABNORMAL HIGH (ref <=45)
Large VLDL-P: 2.4 nmol/L (ref <=2.7)
Triglycerides: 152 mg/dL — ABNORMAL HIGH (ref <150)
VLDL Size: 47.6 nm — ABNORMAL HIGH (ref <=46.6)

## 2012-10-15 ENCOUNTER — Other Ambulatory Visit: Payer: Self-pay | Admitting: Nurse Practitioner

## 2013-02-09 ENCOUNTER — Other Ambulatory Visit: Payer: Self-pay

## 2013-02-09 DIAGNOSIS — F411 Generalized anxiety disorder: Secondary | ICD-10-CM

## 2013-02-09 MED ORDER — SERTRALINE HCL 50 MG PO TABS: 50.0000 mg | ORAL_TABLET | Freq: Every day | ORAL | Status: DC

## 2013-02-09 NOTE — Telephone Encounter (Signed)
Last seen 08/07/12  MMM 

## 2013-02-15 ENCOUNTER — Other Ambulatory Visit: Payer: Self-pay | Admitting: *Deleted

## 2013-02-15 DIAGNOSIS — F17200 Nicotine dependence, unspecified, uncomplicated: Secondary | ICD-10-CM

## 2013-02-15 NOTE — Telephone Encounter (Signed)
LAST OV 08/07/12. NTBS

## 2013-02-17 ENCOUNTER — Encounter: Payer: Self-pay | Admitting: General Practice

## 2013-02-17 ENCOUNTER — Ambulatory Visit (INDEPENDENT_AMBULATORY_CARE_PROVIDER_SITE_OTHER): Payer: Medicare Other | Admitting: General Practice

## 2013-02-17 VITALS — BP 134/83 | HR 88 | Temp 97.7°F | Ht 68.0 in | Wt 166.0 lb

## 2013-02-17 DIAGNOSIS — M545 Low back pain: Secondary | ICD-10-CM

## 2013-02-17 DIAGNOSIS — Z23 Encounter for immunization: Secondary | ICD-10-CM

## 2013-02-17 MED ORDER — IBUPROFEN 800 MG PO TABS: 800.0000 mg | ORAL_TABLET | Freq: Three times a day (TID) | ORAL | Status: DC | PRN

## 2013-02-17 MED ORDER — BUPROPION HCL ER (XL) 150 MG PO TB24: 150.0000 mg | ORAL_TABLET | Freq: Every day | ORAL | Status: DC

## 2013-02-17 MED ORDER — METHYLPREDNISOLONE ACETATE 80 MG/ML IJ SUSP
80.0000 mg | Freq: Once | INTRAMUSCULAR | Status: AC
  Administered 2013-02-17: 80 mg via INTRAMUSCULAR

## 2013-02-17 NOTE — Progress Notes (Signed)
  Subjective:    Patient ID: [name], female    DOB: 1945/05/18, 67 y.o.   MRN: [id number]  Back Pain This is a new problem. The current episode started more than 1 month ago. The pain is present in the lumbar spine. The quality of the pain is described as aching. The pain does not radiate. The pain is at a severity of 7/10. The pain is moderate. The pain is worse during the day. The symptoms are aggravated by standing. Associated symptoms include numbness and tingling. Pertinent negatives include no abdominal pain, bladder incontinence, bowel incontinence, chest pain, fever or headaches. (Numbness and tingling in left leg) Risk factors include lack of exercise, sedentary lifestyle and poor posture. She has tried heat and NSAIDs for the symptoms. The treatment provided no relief.  Reports having back surgery 15 years ago and pain returned 2-3 months afterwards. She reports being seen by a neurosurgeon in [place], but unable to get an appointment.     Review of Systems  Constitutional: Negative for fever and chills.  Respiratory: Negative for chest tightness and shortness of breath.   Cardiovascular: Negative for chest pain and palpitations.  Gastrointestinal: Negative for abdominal pain and bowel incontinence.  Genitourinary: Negative for bladder incontinence.  Musculoskeletal: Positive for back pain.       Low back pain   Neurological: Positive for tingling and numbness. Negative for headaches.       Objective:   Physical Exam  Constitutional: She is oriented to person, place, and time. She appears well-developed and well-nourished.  Cardiovascular: Normal rate, regular rhythm and normal heart sounds.   Pulmonary/Chest: Effort normal and breath sounds normal. No respiratory distress. She exhibits no tenderness.  Musculoskeletal: She exhibits tenderness.  Tenderness with palpation to right lumbar area  Neurological: She is alert and oriented to person, place, and time.   Skin: Skin is warm and dry.  Psychiatric: She has a normal mood and affect.          Assessment & Plan:  1. Need for prophylactic vaccination against Streptococcus pneumoniae (pneumococcus)  - Pneumococcal polysaccharide vaccine 23-valent greater than or equal to 2yo subcutaneous/IM  2. Low back pain  - methylPREDNISolone acetate (DEPO-MEDROL) injection 80 mg; Inject 1 mL (80 mg total) into the muscle once. - ibuprofen (ADVIL,MOTRIN) 800 MG tablet; Take 1 tablet (800 mg total) by mouth every 8 (eight) hours as needed.  Dispense: 30 tablet; Refill: 0 -may use ice compresses three times daily to affected area, 20 minutes and 20 minutes off -discussed with patient about making a referral to another ortho specialist, since having trouble getting into see ortho specialist in [facility] and she declined -Patient verbalized understanding -Coralie Keens, [name]-C

## 2013-02-17 NOTE — Telephone Encounter (Signed)
Flexeril not refilled, patient needs to be seen if having symptoms that require this medication for possible referral.

## 2013-02-17 NOTE — Telephone Encounter (Signed)
Message about flexeril for this patient entered in error, please disregard.

## 2013-02-17 NOTE — Patient Instructions (Signed)
Back Pain, Adult Low back pain is very common. About 1 in 5 people have back pain.The cause of low back pain is rarely dangerous. The pain often gets better over time.About half of people with a sudden onset of back pain feel better in just 2 weeks. About 8 in 10 people feel better by 6 weeks.  CAUSES Some common causes of back pain include:  Strain of the muscles or ligaments supporting the spine.  Wear and tear (degeneration) of the spinal discs.  Arthritis.  Direct injury to the back. DIAGNOSIS Most of the time, the direct cause of low back pain is not known.However, back pain can be treated effectively even when the exact cause of the pain is unknown.Answering your caregiver's questions about your overall health and symptoms is one of the most accurate ways to make sure the cause of your pain is not dangerous. If your caregiver needs more information, he or she may order lab work or imaging tests (X-rays or MRIs).However, even if imaging tests show changes in your back, this usually does not require surgery. HOME CARE INSTRUCTIONS For many people, back pain returns.Since low back pain is rarely dangerous, it is often a condition that people can learn to manageon their own.   Remain active. It is stressful on the back to sit or stand in one place. Do not sit, drive, or stand in one place for more than 30 minutes at a time. Take short walks on level surfaces as soon as pain allows.Try to increase the length of time you walk each day.  Do not stay in bed.Resting more than 1 or 2 days can delay your recovery.  Do not avoid exercise or work.Your body is made to move.It is not dangerous to be active, even though your back may hurt.Your back will likely heal faster if you return to being active before your pain is gone.  Pay attention to your body when you bend and lift. Many people have less discomfortwhen lifting if they bend their knees, keep the load close to their bodies,and  avoid twisting. Often, the most comfortable positions are those that put less stress on your recovering back.  Find a comfortable position to sleep. Use a firm mattress and lie on your side with your knees slightly bent. If you lie on your back, put a pillow under your knees.  Only take over-the-counter or prescription medicines as directed by your caregiver. Over-the-counter medicines to reduce pain and inflammation are often the most helpful.Your caregiver may prescribe muscle relaxant drugs.These medicines help dull your pain so you can more quickly return to your normal activities and healthy exercise.  Put ice on the injured area.  Put ice in a plastic bag.  Place a towel between your skin and the bag.  Leave the ice on for 15-20 minutes, 03-04 times a day for the first 2 to 3 days. After that, ice and heat may be alternated to reduce pain and spasms.  Ask your caregiver about trying back exercises and gentle massage. This may be of some benefit.  Avoid feeling anxious or stressed.Stress increases muscle tension and can worsen back pain.It is important to recognize when you are anxious or stressed and learn ways to manage it.Exercise is a great option. SEEK MEDICAL CARE IF:  You have pain that is not relieved with rest or medicine.  You have pain that does not improve in 1 week.  You have new symptoms.  You are generally not feeling well. SEEK   IMMEDIATE MEDICAL CARE IF:   You have pain that radiates from your back into your legs.  You develop new bowel or bladder control problems.  You have unusual weakness or numbness in your arms or legs.  You develop nausea or vomiting.  You develop abdominal pain.  You feel faint. Document Released: 03/18/2005 Document Revised: 09/17/2011 Document Reviewed: 08/06/2010 Exit[employer] Patient Information 2014 [employer].  

## 2013-02-22 ENCOUNTER — Other Ambulatory Visit (INDEPENDENT_AMBULATORY_CARE_PROVIDER_SITE_OTHER): Payer: Medicare Other

## 2013-02-22 DIAGNOSIS — I10 Essential (primary) hypertension: Secondary | ICD-10-CM

## 2013-02-22 DIAGNOSIS — E785 Hyperlipidemia, unspecified: Secondary | ICD-10-CM

## 2013-02-22 NOTE — Telephone Encounter (Signed)
Aware. 

## 2013-02-24 ENCOUNTER — Other Ambulatory Visit: Payer: Self-pay | Admitting: General Practice

## 2013-02-24 LAB — BMP8+EGFR
BUN/Creatinine Ratio: 15 (ref 11–26)
BUN: 15 mg/dL (ref 8–27)
CO2: 24 mmol/L (ref 18–29)
Calcium: 9.7 mg/dL (ref 8.6–10.2)
Creatinine, Ser: 0.99 mg/dL (ref 0.57–1.00)
Glucose: 91 mg/dL (ref 65–99)

## 2013-02-24 LAB — NMR, LIPOPROFILE
HDL Cholesterol by NMR: 54 mg/dL (ref >=40)
LDL Particle Number: 1391 nmol/L — ABNORMAL HIGH (ref <1000)
LDLC SERPL CALC-MCNC: 79 mg/dL (ref <100)

## 2013-03-02 ENCOUNTER — Other Ambulatory Visit: Payer: Self-pay

## 2013-03-02 DIAGNOSIS — E785 Hyperlipidemia, unspecified: Secondary | ICD-10-CM

## 2013-03-02 MED ORDER — ROSUVASTATIN CALCIUM 10 MG PO TABS: 10.0000 mg | ORAL_TABLET | Freq: Every day | ORAL | Status: DC

## 2013-03-08 ENCOUNTER — Other Ambulatory Visit: Payer: Self-pay | Admitting: *Deleted

## 2013-03-08 DIAGNOSIS — M545 Low back pain: Secondary | ICD-10-CM

## 2013-03-12 MED ORDER — IBUPROFEN 800 MG PO TABS: 800.0000 mg | ORAL_TABLET | Freq: Three times a day (TID) | ORAL | Status: DC | PRN

## 2013-03-15 ENCOUNTER — Other Ambulatory Visit: Payer: Self-pay | Admitting: Nurse Practitioner

## 2013-03-22 ENCOUNTER — Telehealth: Payer: Self-pay | Admitting: Nurse Practitioner

## 2013-03-22 ENCOUNTER — Other Ambulatory Visit: Payer: Self-pay

## 2013-03-22 MED ORDER — HYDROCHLOROTHIAZIDE 25 MG PO TABS: 25.0000 mg | ORAL_TABLET | Freq: Every day | ORAL | Status: DC

## 2013-04-06 ENCOUNTER — Other Ambulatory Visit: Payer: Self-pay

## 2013-04-06 DIAGNOSIS — M545 Low back pain, unspecified: Secondary | ICD-10-CM

## 2013-04-06 MED ORDER — IBUPROFEN 800 MG PO TABS: 800.0000 mg | ORAL_TABLET | Freq: Three times a day (TID) | ORAL | Status: DC | PRN

## 2013-04-06 NOTE — Telephone Encounter (Signed)
x

## 2013-04-12 ENCOUNTER — Other Ambulatory Visit: Payer: Self-pay

## 2013-04-12 DIAGNOSIS — M545 Low back pain, unspecified: Secondary | ICD-10-CM

## 2013-04-12 MED ORDER — IBUPROFEN 800 MG PO TABS: 800.0000 mg | ORAL_TABLET | Freq: Three times a day (TID) | ORAL | Status: DC | PRN

## 2013-04-27 ENCOUNTER — Other Ambulatory Visit: Payer: Self-pay

## 2013-04-27 DIAGNOSIS — F17200 Nicotine dependence, unspecified, uncomplicated: Secondary | ICD-10-CM

## 2013-04-27 MED ORDER — BUPROPION HCL ER (XL) 150 MG PO TB24: 150.0000 mg | ORAL_TABLET | Freq: Every day | ORAL | Status: DC

## 2013-05-12 ENCOUNTER — Other Ambulatory Visit: Payer: Self-pay | Admitting: *Deleted

## 2013-05-12 DIAGNOSIS — M545 Low back pain, unspecified: Secondary | ICD-10-CM

## 2013-05-13 MED ORDER — IBUPROFEN 800 MG PO TABS: 800.0000 mg | ORAL_TABLET | Freq: Three times a day (TID) | ORAL | Status: DC | PRN

## 2013-05-18 ENCOUNTER — Other Ambulatory Visit: Payer: Self-pay | Admitting: *Deleted

## 2013-05-18 DIAGNOSIS — M545 Low back pain, unspecified: Secondary | ICD-10-CM

## 2013-05-18 MED ORDER — IBUPROFEN 800 MG PO TABS: 800.0000 mg | ORAL_TABLET | Freq: Three times a day (TID) | ORAL | Status: DC | PRN

## 2013-05-20 ENCOUNTER — Other Ambulatory Visit: Payer: Self-pay

## 2013-05-20 MED ORDER — SERTRALINE HCL 50 MG PO TABS: 50.0000 mg | ORAL_TABLET | Freq: Every day | ORAL | Status: DC

## 2013-06-30 ENCOUNTER — Other Ambulatory Visit: Payer: Self-pay | Admitting: *Deleted

## 2013-06-30 MED ORDER — SERTRALINE HCL 50 MG PO TABS: 50.0000 mg | ORAL_TABLET | Freq: Every day | ORAL | Status: DC

## 2013-06-30 NOTE — Telephone Encounter (Signed)
Patient last seen in 5-14. Please advise

## 2013-07-01 ENCOUNTER — Other Ambulatory Visit: Payer: Self-pay | Admitting: *Deleted

## 2013-07-01 DIAGNOSIS — M545 Low back pain, unspecified: Secondary | ICD-10-CM

## 2013-07-01 MED ORDER — IBUPROFEN 800 MG PO TABS: 800.0000 mg | ORAL_TABLET | Freq: Three times a day (TID) | ORAL | Status: DC | PRN

## 2013-07-16 ENCOUNTER — Other Ambulatory Visit: Payer: Self-pay | Admitting: General Practice

## 2013-07-20 ENCOUNTER — Ambulatory Visit (INDEPENDENT_AMBULATORY_CARE_PROVIDER_SITE_OTHER): Payer: Medicare Other

## 2013-07-20 ENCOUNTER — Ambulatory Visit (INDEPENDENT_AMBULATORY_CARE_PROVIDER_SITE_OTHER): Payer: Medicare Other | Admitting: Nurse Practitioner

## 2013-07-20 VITALS — BP 152/72 | HR 73 | Temp 98.5°F | Ht 68.0 in | Wt 173.2 lb

## 2013-07-20 DIAGNOSIS — I10 Essential (primary) hypertension: Secondary | ICD-10-CM

## 2013-07-20 DIAGNOSIS — E785 Hyperlipidemia, unspecified: Secondary | ICD-10-CM

## 2013-07-20 DIAGNOSIS — Z87891 Personal history of nicotine dependence: Secondary | ICD-10-CM

## 2013-07-20 DIAGNOSIS — F17200 Nicotine dependence, unspecified, uncomplicated: Secondary | ICD-10-CM

## 2013-07-20 DIAGNOSIS — R0989 Other specified symptoms and signs involving the circulatory and respiratory systems: Secondary | ICD-10-CM

## 2013-07-20 DIAGNOSIS — F411 Generalized anxiety disorder: Secondary | ICD-10-CM

## 2013-07-20 DIAGNOSIS — M25559 Pain in unspecified hip: Secondary | ICD-10-CM

## 2013-07-20 DIAGNOSIS — K219 Gastro-esophageal reflux disease without esophagitis: Secondary | ICD-10-CM

## 2013-07-20 DIAGNOSIS — M25552 Pain in left hip: Secondary | ICD-10-CM

## 2013-07-20 MED ORDER — SERTRALINE HCL 50 MG PO TABS: 50.0000 mg | ORAL_TABLET | Freq: Every day | ORAL | Status: DC

## 2013-07-20 MED ORDER — BUPROPION HCL ER (XL) 150 MG PO TB24: 150.0000 mg | ORAL_TABLET | Freq: Every day | ORAL | Status: DC

## 2013-07-20 MED ORDER — ROSUVASTATIN CALCIUM 10 MG PO TABS: 10.0000 mg | ORAL_TABLET | Freq: Every day | ORAL | Status: DC

## 2013-07-20 MED ORDER — HYDROCHLOROTHIAZIDE 25 MG PO TABS: ORAL_TABLET | ORAL | Status: DC

## 2013-07-20 MED ORDER — LISINOPRIL 10 MG PO TABS: 10.0000 mg | ORAL_TABLET | Freq: Every day | ORAL | Status: DC

## 2013-07-20 MED ORDER — METHYLPREDNISOLONE ACETATE 80 MG/ML IJ SUSP
80.0000 mg | Freq: Once | INTRAMUSCULAR | Status: AC
  Administered 2013-07-20: 80 mg via INTRAMUSCULAR

## 2013-07-20 NOTE — Patient Instructions (Signed)
Hip Pain  The hips join the upper legs to the lower pelvis. The bones, cartilage, tendons, and muscles of the hip joint perform a lot of work each day holding your body weight and allowing you to move around.  Hip pain is a common symptom. It can range from a minor ache to severe pain on 1 or both hips. Pain may be felt on the inside of the hip joint near the groin, or the outside near the buttocks and upper thigh. There may be swelling or stiffness as well. It occurs more often when a person walks or performs activity. There are many reasons hip pain can develop.  CAUSES   It is important to work with your caregiver to identify the cause since many conditions can impact the bones, cartilage, muscles, and tendons of the hips. Causes for hip pain include:   Broken (fractured) bones.   Separation of the thighbone from the hip socket (dislocation).   Torn cartilage of the hip joint.   Swelling (inflammation) of a tendon (tendonitis), the sac within the hip joint (bursitis), or a joint.   A weakening in the abdominal wall (hernia), affecting the nerves to the hip.   Arthritis in the hip joint or lining of the hip joint.   Pinched nerves in the back, hip, or upper thigh.   A bulging disc in the spine (herniated disc).   Rarely, bone infection or cancer.  DIAGNOSIS   The location of your hip pain will help your caregiver understand what may be causing the pain. A diagnosis is based on your medical history, your symptoms, results from your physical exam, and results from diagnostic tests. Diagnostic tests may include X-ray exams, a computerized magnetic scan (magnetic resonance imaging, MRI), or bone scan.  TREATMENT   Treatment will depend on the cause of your hip pain. Treatment may include:   Limiting activities and resting until symptoms improve.   Crutches or other walking supports (a cane or brace).   Ice, elevation, and compression.   Physical therapy or home exercises.   Shoe inserts or special  shoes.   Losing weight.   Medications to reduce pain.   Undergoing surgery.  HOME CARE INSTRUCTIONS    Only take over-the-counter or prescription medicines for pain, discomfort, or fever as directed by your caregiver.   Put ice on the injured area:   Put ice in a plastic bag.   Place a towel between your skin and the bag.   Leave the ice on for 15-20 minutes at a time, 03-04 times a day.   Keep your leg raised (elevated) when possible to lessen swelling.   Avoid activities that cause pain.   Follow specific exercises as directed by your caregiver.   Sleep with a pillow between your legs on your most comfortable side.   Record how often you have hip pain, the location of the pain, and what it feels like. This information may be helpful to you and your caregiver.   Ask your caregiver about returning to work or sports and whether you should drive.   Follow up with your caregiver for further exams, therapy, or testing as directed.  SEEK MEDICAL CARE IF:    Your pain or swelling continues or worsens after 1 week.   You are feeling unwell or have chills.   You have increasing difficulty with walking.   You have a loss of sensation or other new symptoms.   You have questions or concerns.  SEEK   IMMEDIATE MEDICAL CARE IF:    You cannot put weight on the affected hip.   You have fallen.   You have a sudden increase in pain and swelling in your hip.   You have a fever.  MAKE SURE YOU:    Understand these instructions.   Will watch your condition.   Will get help right away if you are not doing well or get worse.  Document Released: 09/05/2009 Document Revised: 06/10/2011 Document Reviewed: 09/05/2009  [employer] Patient Information 2014 [employer].

## 2013-07-20 NOTE — Progress Notes (Signed)
Subjective:    Patient ID: [name], female    DOB: 15-Feb-1946, 68 y.o.   MRN: [id number]  Patient here today for follow up of chronic medical problems.C/O pain going down left leg into ankle- has had a steroid shot 3 months ago which really helped with her pain and she would like another one today if possible  Hypertension This is a chronic problem. The current episode started more than 1 year ago. The problem is unchanged. The problem is controlled. Pertinent negatives include no anxiety, chest pain, headaches, palpitations, peripheral edema or shortness of breath. There are no associated agents to hypertension. Risk factors for coronary artery disease include dyslipidemia and post-menopausal state. Past treatments include ACE inhibitors and diuretics. The current treatment provides significant improvement. Compliance problems include diet and exercise.   Hyperlipidemia This is a chronic problem. The current episode started more than 1 year ago. The problem is uncontrolled. Recent lipid tests were reviewed and are high. There are no known factors aggravating her hyperlipidemia. Pertinent negatives include no chest pain, leg pain, myalgias or shortness of breath. Current antihyperlipidemic treatment includes statins. Risk factors for coronary artery disease include post-menopausal.  Nicotine Dependence Presents for follow-up visit. Symptoms are negative for irritability. Her urge triggers include company of smokers. The symptoms have been worsening. Her first smoke is before 6 AM. She smokes < 1/2 a pack (started using vapor cigarette) of cigarettes per day. Compliance with prior treatments has been poor (would like to try wellbutrin to se if it will help with smoking cessation.).  Anxiety Presents for follow-up visit. Symptoms include nervous/anxious behavior. Patient reports no chest pain, compulsions, depressed mood, dry mouth, irritability, malaise, nausea, palpitations or shortness of  breath. Symptoms occur occasionally. The most recent episode lasted 5 minutes. The patient sleeps 6 hours per night. The quality of sleep is fair. Nighttime awakenings: occasional.   Compliance with medications is 76-100% (increased celexa at last visit an dpatient can't tell where it is helping would like to try something else.).      Review of Systems  Constitutional: Negative for irritability.  Respiratory: Negative for shortness of breath.   Cardiovascular: Negative for chest pain and palpitations.  Gastrointestinal: Negative for nausea.  Musculoskeletal: Negative for myalgias.  Neurological: Negative for headaches.  Psychiatric/Behavioral: The patient is nervous/anxious.   All other systems reviewed and are negative.      Objective:   Physical Exam  Constitutional: She is oriented to person, place, and time. She appears well-developed and well-nourished.  HENT:  Nose: Nose normal.  Mouth/Throat: Oropharynx is clear and moist.  Eyes: EOM are normal.  Neck: Trachea normal, normal range of motion and full passive range of motion without pain. Neck supple. No JVD present. Carotid bruit is not present. No thyromegaly present.  Cardiovascular: Normal rate, regular rhythm, normal heart sounds and intact distal pulses.  Exam reveals no gallop and no friction rub.   No murmur heard. Pulmonary/Chest: Effort normal and breath sounds normal.  Abdominal: Soft. Bowel sounds are normal. She exhibits no distension and no mass. There is no tenderness.  Musculoskeletal: Normal range of motion.  FROM of left hip without pain (+) SLR on left st 90 degrees DTR's equal bil Motor strength and sensation distally intact.  Lymphadenopathy:    She has no cervical adenopathy.  Neurological: She is alert and oriented to person, place, and time. She has normal reflexes.  Skin: Skin is warm and dry.  Psychiatric: She has a normal mood  and affect. Her behavior is normal. Judgment and thought content  normal.    BP 152/72  Pulse 73  Temp(Src) 98.5 F (36.9 C) (Oral)  Ht 5' 8"  (1.727 m)  Wt 173 lb 3.2 oz (78.563 kg)  BMI 26.34 kg/m2 Chest x ray-normal-Preliminary reading by Ronnald Collum, [name]  [facility]  EKG-NSR- Mary-Margaret Hassell Done, [name]       Assessment & Plan:   1. GERD   2. CAROTID BRUIT   3. Hyperlipidemia   4. Hypertension   5. GAD (generalized anxiety disorder)   6. Left hip pain    Orders Placed This Encounter  Procedures  . CMP14+EGFR  . NMR, lipoprofile   Meds ordered this encounter  Medications  . DISCONTD: buPROPion (WELLBUTRIN XL) 150 MG 24 hr tablet    Sig: Take 150 mg by mouth daily.  . methylPREDNISolone acetate (DEPO-MEDROL) injection 80 mg    Sig:   . rosuvastatin (CRESTOR) 10 MG tablet    Sig: Take 1 tablet (10 mg total) by mouth daily.    Dispense:  30 tablet    Refill:  5    Order Specific Question:  Supervising Provider    Answer:  Chipper Herb [[id number]]  . lisinopril (PRINIVIL,ZESTRIL) 10 MG tablet    Sig: Take 1 tablet (10 mg total) by mouth daily.    Dispense:  30 tablet    Refill:  5    Order Specific Question:  Supervising Provider    Answer:  Chipper Herb [[id number]]  . hydrochlorothiazide (HYDRODIURIL) 25 MG tablet    Sig: TAKE ONE TABLET BY MOUTH ONCE DAILY    Dispense:  30 tablet    Refill:  5    Order Specific Question:  Supervising Provider    Answer:  Chipper Herb [[id number]]  . sertraline (ZOLOFT) 50 MG tablet    Sig: Take 1 tablet (50 mg total) by mouth daily.    Dispense:  30 tablet    Refill:  5    Order Specific Question:  Supervising Provider    Answer:  Chipper Herb [[id number]]  . buPROPion (WELLBUTRIN XL) 150 MG 24 hr tablet    Sig: Take 1 tablet (150 mg total) by mouth daily.    Dispense:  30 tablet    Refill:  5    Order Specific Question:  Supervising Provider    Answer:  Chipper Herb [[id number]]    Labs pending Health maintenance reviewed Diet and exercise encouraged Continue all meds Follow up  In 6 month    Cadiz, [name]

## 2013-07-21 ENCOUNTER — Other Ambulatory Visit: Payer: Self-pay | Admitting: General Practice

## 2013-07-22 ENCOUNTER — Telehealth: Payer: Self-pay | Admitting: Family Medicine

## 2013-07-22 LAB — NMR, LIPOPROFILE
CHOLESTEROL: 158 mg/dL (ref <200)
HDL CHOLESTEROL BY NMR: 41 mg/dL (ref >=40)
HDL Particle Number: 31 umol/L (ref >=30.5)
LDL Particle Number: 974 nmol/L (ref <1000)
LDL Size: 20.4 nm (ref >20.5)
LDLC SERPL CALC-MCNC: 73 mg/dL (ref <100)
LP-IR Score: 77 — ABNORMAL HIGH (ref <=45)
Small LDL Particle Number: 493 nmol/L (ref <=527)
Triglycerides by NMR: 218 mg/dL — ABNORMAL HIGH (ref <150)

## 2013-07-22 LAB — CMP14+EGFR
ALK PHOS: 81 IU/L (ref 39–117)
ALT: 15 IU/L (ref 0–32)
AST: 20 IU/L (ref 0–40)
Albumin/Globulin Ratio: 1.8 (ref 1.1–2.5)
Albumin: 4.2 g/dL (ref 3.6–4.8)
BUN / CREAT RATIO: 14 (ref 11–26)
BUN: 10 mg/dL (ref 8–27)
CHLORIDE: 101 mmol/L (ref 97–108)
CO2: 19 mmol/L (ref 18–29)
Calcium: 9 mg/dL (ref 8.7–10.3)
Creatinine, Ser: 0.71 mg/dL (ref 0.57–1.00)
GFR calc Af Amer: 103 mL/min/{1.73_m2} (ref >59)
GFR calc non Af Amer: 89 mL/min/{1.73_m2} (ref >59)
GLOBULIN, TOTAL: 2.4 g/dL (ref 1.5–4.5)
Glucose: 96 mg/dL (ref 65–99)
POTASSIUM: 4 mmol/L (ref 3.5–5.2)
SODIUM: 141 mmol/L (ref 134–144)
Total Bilirubin: <0.2 mg/dL (ref 0.0–1.2)
Total Protein: 6.6 g/dL (ref 6.0–8.5)

## 2013-07-22 NOTE — Telephone Encounter (Signed)
Patient aware.

## 2013-07-22 NOTE — Telephone Encounter (Signed)
Message copied by Azalee CourseFULP, Rashunda Passon on Thu Jul 22, 2013  9:16 AM ------      Message from: Bennie PieriniMARTIN, MARY-MARGARET      Created: Thu Jul 22, 2013  8:04 AM       Kidney and liver function stable      cholesterol looks great      Continue current meds- low fat diet and exercise and recheck in 3 months       ------

## 2013-08-02 ENCOUNTER — Ambulatory Visit (INDEPENDENT_AMBULATORY_CARE_PROVIDER_SITE_OTHER): Payer: Medicare Other | Admitting: Family Medicine

## 2013-08-02 DIAGNOSIS — Z23 Encounter for immunization: Secondary | ICD-10-CM

## 2013-08-04 ENCOUNTER — Other Ambulatory Visit: Payer: Self-pay | Admitting: General Practice

## 2013-09-20 ENCOUNTER — Other Ambulatory Visit: Payer: Self-pay | Admitting: Nurse Practitioner

## 2013-10-19 ENCOUNTER — Other Ambulatory Visit: Payer: Self-pay | Admitting: Nurse Practitioner

## 2013-10-21 ENCOUNTER — Other Ambulatory Visit: Payer: Self-pay | Admitting: Nurse Practitioner

## 2013-11-30 ENCOUNTER — Other Ambulatory Visit: Payer: Self-pay | Admitting: Nurse Practitioner

## 2013-12-01 NOTE — Telephone Encounter (Signed)
Last ov 11/14

## 2013-12-21 ENCOUNTER — Ambulatory Visit (INDEPENDENT_AMBULATORY_CARE_PROVIDER_SITE_OTHER): Payer: Medicare Other | Admitting: Family Medicine

## 2013-12-21 VITALS — BP 140/78 | HR 76 | Temp 97.8°F | Ht 68.0 in | Wt 172.8 lb

## 2013-12-21 DIAGNOSIS — M25512 Pain in left shoulder: Secondary | ICD-10-CM

## 2013-12-21 DIAGNOSIS — M25519 Pain in unspecified shoulder: Secondary | ICD-10-CM

## 2013-12-21 MED ORDER — NAPROXEN 500 MG PO TABS: 500.0000 mg | ORAL_TABLET | Freq: Two times a day (BID) | ORAL | Status: DC

## 2013-12-21 MED ORDER — CYCLOBENZAPRINE HCL 10 MG PO TABS: 10.0000 mg | ORAL_TABLET | Freq: Three times a day (TID) | ORAL | Status: DC | PRN

## 2013-12-21 NOTE — Progress Notes (Signed)
   Subjective:    Patient ID: [name], female    DOB: 02-25-1946, 68 y.o.   MRN: [id number]  HPI This 68 y.o. female presents for evaluation of left shoulder discomfort.  She has had this for over a week and is having difficulty with pain at night and cannot sleep.   Review of Systems C/o left shoulder pain No chest pain, SOB, HA, dizziness, vision change, N/V, diarrhea, constipation, dysuria, urinary urgency or frequency, myalgias, arthralgias or rash.     Objective:   Physical Exam  Vital signs noted  Well developed well nourished female.  HEENT - Head atraumatic Normocephalic Respiratory - Lungs CTA bilateral Cardiac - RRR S1 and S2 without murmur GI - Abdomen soft Nontender and bowel sounds active x 4 MS - TTP left SITS muscles.  Normal ROM left shoulder, grips wnl     Assessment & Plan:  Left shoulder pain - Plan: naproxen (NAPROSYN) 500 MG tablet, cyclobenzaprine (FLEXERIL) 10 MG tablet  Deatra Canter [name]

## 2014-01-17 ENCOUNTER — Ambulatory Visit (INDEPENDENT_AMBULATORY_CARE_PROVIDER_SITE_OTHER): Payer: Medicare Other | Admitting: Family Medicine

## 2014-01-17 ENCOUNTER — Ambulatory Visit (INDEPENDENT_AMBULATORY_CARE_PROVIDER_SITE_OTHER): Payer: Medicare Other

## 2014-01-17 ENCOUNTER — Encounter (INDEPENDENT_AMBULATORY_CARE_PROVIDER_SITE_OTHER): Payer: Self-pay

## 2014-01-17 ENCOUNTER — Encounter: Payer: Self-pay | Admitting: Family Medicine

## 2014-01-17 VITALS — BP 165/89 | HR 74 | Temp 97.0°F | Ht 68.0 in | Wt 173.0 lb

## 2014-01-17 DIAGNOSIS — M25512 Pain in left shoulder: Secondary | ICD-10-CM

## 2014-01-17 DIAGNOSIS — Z23 Encounter for immunization: Secondary | ICD-10-CM

## 2014-01-17 MED ORDER — KETOROLAC TROMETHAMINE 30 MG/ML IJ SOLN
30.0000 mg | Freq: Once | INTRAMUSCULAR | Status: AC
  Administered 2014-01-17: 30 mg via INTRAMUSCULAR

## 2014-01-17 MED ORDER — NAPROXEN 500 MG PO TABS: 500.0000 mg | ORAL_TABLET | Freq: Two times a day (BID) | ORAL | Status: DC

## 2014-01-17 MED ORDER — HYDROCODONE-ACETAMINOPHEN 5-325 MG PO TABS: 1.0000 | ORAL_TABLET | Freq: Four times a day (QID) | ORAL | Status: DC | PRN

## 2014-01-17 NOTE — Progress Notes (Signed)
   Subjective:    Patient ID: [name], female    DOB: 1946-01-14, 68 y.o.   MRN: [id number]  HPI C/o left shoulder pain and discomfort.  She was tx'd for this 2 weeks ago and she did get better but then she has been having new pain in her left neck which radiates into her shoulder and she is having moderate to severe pain and is having difficulty sleeping at night.   Review of Systems No chest pain, SOB, HA, dizziness, vision change, N/V, diarrhea, constipation, dysuria, urinary urgency or frequency, myalgias, arthralgias or rash.     Objective:   Physical Exam  MS - TTP left trapezius w/o trigger point.  Full ROM left shoulder.     Xray left shoulder - no fx Prelimnary reading by Chrissie NoaWilliam Ajooni Karam,[name]    Assessment & Plan:  Left shoulder pain - Plan: DG Shoulder Left, naproxen (NAPROSYN) 500 MG tablet, HYDROcodone-acetaminophen (NORCO) 5-325 MG per tablet, ketorolac (TORADOL) 30 MG/ML injection 30 mg  Recommend if not better to go see PT and will refer and wants to wait for now but will call if not better.  Deatra CanterWilliam J Jahmere Bramel [name]

## 2014-01-20 ENCOUNTER — Other Ambulatory Visit: Payer: Self-pay | Admitting: Nurse Practitioner

## 2014-02-01 ENCOUNTER — Other Ambulatory Visit: Payer: Self-pay

## 2014-02-01 MED ORDER — SERTRALINE HCL 50 MG PO TABS: 50.0000 mg | ORAL_TABLET | Freq: Every day | ORAL | Status: DC

## 2014-02-01 MED ORDER — LISINOPRIL 10 MG PO TABS: 10.0000 mg | ORAL_TABLET | Freq: Every day | ORAL | Status: DC

## 2014-02-01 NOTE — Telephone Encounter (Signed)
Last seen 01/17/14 [facility] [place]  This med not on EPIC list

## 2014-02-21 ENCOUNTER — Other Ambulatory Visit: Payer: Self-pay | Admitting: Nurse Practitioner

## 2014-03-03 ENCOUNTER — Other Ambulatory Visit: Payer: Self-pay | Admitting: Nurse Practitioner

## 2014-03-26 ENCOUNTER — Other Ambulatory Visit: Payer: Self-pay | Admitting: Nurse Practitioner

## 2014-04-11 ENCOUNTER — Other Ambulatory Visit: Payer: Self-pay | Admitting: *Deleted

## 2014-04-11 MED ORDER — ROSUVASTATIN CALCIUM 10 MG PO TABS: 10.0000 mg | ORAL_TABLET | Freq: Every day | ORAL | Status: DC

## 2014-04-26 ENCOUNTER — Other Ambulatory Visit: Payer: Self-pay | Admitting: Family Medicine

## 2014-05-02 ENCOUNTER — Other Ambulatory Visit: Payer: Self-pay | Admitting: Family Medicine

## 2014-05-13 ENCOUNTER — Other Ambulatory Visit: Payer: Self-pay | Admitting: Nurse Practitioner

## 2014-05-13 NOTE — Telephone Encounter (Signed)
Last labs 06/2013, been told x2 needs to be seen

## 2014-05-13 NOTE — Telephone Encounter (Signed)
Refill denied- NT[facility] 

## 2014-05-19 ENCOUNTER — Other Ambulatory Visit: Payer: Self-pay | Admitting: Nurse Practitioner

## 2014-05-23 ENCOUNTER — Other Ambulatory Visit: Payer: Self-pay | Admitting: Nurse Practitioner

## 2014-05-23 MED ORDER — ROSUVASTATIN CALCIUM 10 MG PO TABS: 10.0000 mg | ORAL_TABLET | Freq: Every day | ORAL | Status: DC

## 2014-05-23 NOTE — Telephone Encounter (Signed)
done

## 2014-05-29 ENCOUNTER — Other Ambulatory Visit: Payer: Self-pay | Admitting: Family Medicine

## 2014-06-02 ENCOUNTER — Other Ambulatory Visit: Payer: Self-pay | Admitting: Family Medicine

## 2014-06-13 ENCOUNTER — Encounter: Payer: Self-pay | Admitting: Nurse Practitioner

## 2014-06-13 ENCOUNTER — Ambulatory Visit (INDEPENDENT_AMBULATORY_CARE_PROVIDER_SITE_OTHER): Payer: Medicare Other | Admitting: Nurse Practitioner

## 2014-06-13 VITALS — BP 136/75 | HR 96 | Temp 96.7°F | Ht 68.0 in | Wt 168.0 lb

## 2014-06-13 DIAGNOSIS — E785 Hyperlipidemia, unspecified: Secondary | ICD-10-CM

## 2014-06-13 DIAGNOSIS — Z720 Tobacco use: Secondary | ICD-10-CM | POA: Diagnosis not present

## 2014-06-13 DIAGNOSIS — I10 Essential (primary) hypertension: Secondary | ICD-10-CM

## 2014-06-13 DIAGNOSIS — F411 Generalized anxiety disorder: Secondary | ICD-10-CM | POA: Diagnosis not present

## 2014-06-13 DIAGNOSIS — K219 Gastro-esophageal reflux disease without esophagitis: Secondary | ICD-10-CM

## 2014-06-13 DIAGNOSIS — F17200 Nicotine dependence, unspecified, uncomplicated: Secondary | ICD-10-CM

## 2014-06-13 MED ORDER — HYDROCHLOROTHIAZIDE 25 MG PO TABS: 25.0000 mg | ORAL_TABLET | Freq: Every day | ORAL | Status: DC

## 2014-06-13 MED ORDER — SERTRALINE HCL 50 MG PO TABS: 50.0000 mg | ORAL_TABLET | Freq: Every day | ORAL | Status: DC

## 2014-06-13 MED ORDER — ROSUVASTATIN CALCIUM 10 MG PO TABS: 10.0000 mg | ORAL_TABLET | Freq: Every day | ORAL | Status: DC

## 2014-06-13 MED ORDER — LISINOPRIL 10 MG PO TABS: 10.0000 mg | ORAL_TABLET | Freq: Every day | ORAL | Status: DC

## 2014-06-13 NOTE — Progress Notes (Signed)
Subjective:    Patient ID: [name], female    DOB: [birth year], 69 y.o.   MRN: [id number]  Hypertension This is a chronic problem. The current episode started more than 1 year ago. The problem is controlled. Associated symptoms include anxiety. Pertinent negatives include no chest pain, headaches, palpitations or shortness of breath. Past treatments include diuretics and ACE inhibitors. The current treatment provides moderate improvement. Compliance problems include diet and exercise.   Hyperlipidemia This is a chronic problem. The current episode started more than 1 year ago. The problem is controlled. Recent lipid tests were reviewed and are normal. She has no history of diabetes, hypothyroidism or obesity. Pertinent negatives include no chest pain, myalgias or shortness of breath. Current antihyperlipidemic treatment includes statins. The current treatment provides moderate improvement of lipids. Compliance problems include adherence to diet and adherence to exercise.  Risk factors for coronary artery disease include dyslipidemia, hypertension, post-menopausal and a sedentary lifestyle.  Nicotine Dependence Presents for follow-up visit. Preferred tobacco types include cigarettes. Preferred cigarette types include filtered. Her urge triggers include company of smokers. Her first smoke is from 6 to 8 AM. She smokes 1 pack of cigarettes per day. She started smoking when she was [age] years old.  Anxiety Presents for follow-up visit. Symptoms include nervous/anxious behavior. Patient reports no chest pain, nausea, palpitations or shortness of breath.        Review of Systems  Respiratory: Negative for shortness of breath.   Cardiovascular: Negative for chest pain and palpitations.  Gastrointestinal: Negative for nausea.  Musculoskeletal: Negative for myalgias.  Neurological: Negative for headaches.  Psychiatric/Behavioral: The patient is nervous/anxious.   All other systems reviewed  and are negative.      Objective:   Physical Exam  Constitutional: She is oriented to person, place, and time. She appears well-developed and well-nourished.  HENT:  Nose: Nose normal.  Mouth/Throat: Oropharynx is clear and moist.  Eyes: EOM are normal.  Neck: Trachea normal, normal range of motion and full passive range of motion without pain. Neck supple. No JVD present. Carotid bruit is not present. No thyromegaly present.  Cardiovascular: Normal rate, regular rhythm, normal heart sounds and intact distal pulses.  Exam reveals no gallop and no friction rub.   No murmur heard. Pulmonary/Chest: Effort normal and breath sounds normal.  Abdominal: Soft. Bowel sounds are normal. She exhibits no distension and no mass. There is no tenderness.  Musculoskeletal: Normal range of motion.  Lymphadenopathy:    She has no cervical adenopathy.  Neurological: She is alert and oriented to person, place, and time. She has normal reflexes.  Skin: Skin is warm and dry.  Psychiatric: She has a normal mood and affect. Her behavior is normal. Judgment and thought content normal.    BP 136/75 mmHg  Pulse 96  Temp(Src) 96.7 F (35.9 C) (Oral)  Ht _0  (1.727 m)  Wt 168 lb (76.204 kg)  BMI 25.55 kg/m2       Assessment & Plan:  1. Essential hypertension Do not add slat to diet - CMP14+EGFR - hydrochlorothiazide (HYDRODIURIL) 25 MG tablet; Take 1 tablet (25 mg total) by mouth daily.  Dispense: 30 tablet; Refill: 5 - lisinopril (PRINIVIL,ZESTRIL) 10 MG tablet; Take 1 tablet (10 mg total) by mouth daily.  Dispense: 30 tablet; Refill: 5  2. Hyperlipidemia with target LDL less than 100 Low fat diet - NMR, lipoprofile - rosuvastatin (CRESTOR) 10 MG tablet; Take 1 tablet (10 mg total) by mouth daily.  Dispense: 30  tablet; Refill: 5  3. TOBACCO ABUSE Smoking cessation encouraged  4. Gastroesophageal reflux disease without esophagitis Do not add salt to diet  5. GAD (generalized anxiety  disorder) Stress management - sertraline (ZOLOFT) 50 MG tablet; Take 1 tablet (50 mg total) by mouth daily.  Dispense: 30 tablet; Refill: 5    Labs pending Health maintenance reviewed- REFUSES ALL Diet and exercise encouraged Continue all meds Follow up  In 6 months   Schubert, [name]

## 2014-06-13 NOTE — Patient Instructions (Signed)
Smoking Cessation Quitting smoking is important to your health and has many advantages. However, it is not always easy to quit since nicotine is a very addictive drug. Oftentimes, people try 3 times or more before being able to quit. This document explains the best ways for you to prepare to quit smoking. Quitting takes hard work and a lot of effort, but you can do it. ADVANTAGES OF QUITTING SMOKING  You will live longer, feel better, and live better.  Your body will feel the impact of quitting smoking almost immediately.  Within 20 minutes, blood pressure decreases. Your pulse returns to its normal level.  After 8 hours, carbon monoxide levels in the blood return to normal. Your oxygen level increases.  After 24 hours, the chance of having a heart attack starts to decrease. Your breath, hair, and body stop smelling like smoke.  After 48 hours, damaged nerve endings begin to recover. Your sense of taste and smell improve.  After 72 hours, the body is virtually free of nicotine. Your bronchial tubes relax and breathing becomes easier.  After 2 to 12 weeks, lungs can hold more air. Exercise becomes easier and circulation improves.  The risk of having a heart attack, stroke, cancer, or lung disease is greatly reduced.  After 1 year, the risk of coronary heart disease is cut in half.  After 5 years, the risk of stroke falls to the same as a nonsmoker.  After 10 years, the risk of lung cancer is cut in half and the risk of other cancers decreases significantly.  After 15 years, the risk of coronary heart disease drops, usually to the level of a nonsmoker.  If you are pregnant, quitting smoking will improve your chances of having a healthy baby.  The people you live with, especially any children, will be healthier.  You will have extra money to spend on things other than cigarettes. QUESTIONS TO THINK ABOUT BEFORE ATTEMPTING TO QUIT You may want to talk about your answers with your  health care provider.  Why do you want to quit?  If you tried to quit in the past, what helped and what did not?  What will be the most difficult situations for you after you quit? How will you plan to handle them?  Who can help you through the tough times? Your family? Friends? A health care provider?  What pleasures do you get from smoking? What ways can you still get pleasure if you quit? Here are some questions to ask your health care provider:  How can you help me to be successful at quitting?  What medicine do you think would be best for me and how should I take it?  What should I do if I need more help?  What is smoking withdrawal like? How can I get information on withdrawal? GET READY  Set a quit date.  Change your environment by getting rid of all cigarettes, ashtrays, matches, and lighters in your home, car, or work. Do not let people smoke in your home.  Review your past attempts to quit. Think about what worked and what did not. GET SUPPORT AND ENCOURAGEMENT You have a better chance of being successful if you have help. You can get support in many ways.  Tell your family, friends, and coworkers that you are going to quit and need their support. Ask them not to smoke around you.  Get individual, group, or telephone counseling and support. Programs are available at local hospitals and health centers. Call   your local health department for information about programs in your area.  Spiritual beliefs and practices may help some smokers quit.  Download a "quit meter" on your computer to keep track of quit statistics, such as how long you have gone without smoking, cigarettes not smoked, and money saved.  Get a self-help book about quitting smoking and staying off tobacco. LEARN NEW SKILLS AND BEHAVIORS  Distract yourself from urges to smoke. Talk to someone, go for a walk, or occupy your time with a task.  Change your normal routine. Take a different route to work.  Drink tea instead of coffee. Eat breakfast in a different place.  Reduce your stress. Take a hot bath, exercise, or read a book.  Plan something enjoyable to do every day. Reward yourself for not smoking.  Explore interactive web-based programs that specialize in helping you quit. GET MEDICINE AND USE IT CORRECTLY Medicines can help you stop smoking and decrease the urge to smoke. Combining medicine with the above behavioral methods and support can greatly increase your chances of successfully quitting smoking.  Nicotine replacement therapy helps deliver nicotine to your body without the negative effects and risks of smoking. Nicotine replacement therapy includes nicotine gum, lozenges, inhalers, nasal sprays, and skin patches. Some may be available over-the-counter and others require a prescription.  Antidepressant medicine helps people abstain from smoking, but how this works is unknown. This medicine is available by prescription.  Nicotinic receptor partial agonist medicine simulates the effect of nicotine in your brain. This medicine is available by prescription. Ask your health care provider for advice about which medicines to use and how to use them based on your health history. Your health care provider will tell you what side effects to look out for if you choose to be on a medicine or therapy. Carefully read the information on the package. Do not use any other product containing nicotine while using a nicotine replacement product.  RELAPSE OR DIFFICULT SITUATIONS Most relapses occur within the first 3 months after quitting. Do not be discouraged if you start smoking again. Remember, most people try several times before finally quitting. You may have symptoms of withdrawal because your body is used to nicotine. You may crave cigarettes, be irritable, feel very hungry, cough often, get headaches, or have difficulty concentrating. The withdrawal symptoms are only temporary. They are strongest  when you first quit, but they will go away within 10-14 days. To reduce the chances of relapse, try to:  Avoid drinking alcohol. Drinking lowers your chances of successfully quitting.  Reduce the amount of caffeine you consume. Once you quit smoking, the amount of caffeine in your body increases and can give you symptoms, such as a rapid heartbeat, sweating, and anxiety.  Avoid smokers because they can make you want to smoke.  Do not let weight gain distract you. Many smokers will gain weight when they quit, usually less than 10 pounds. Eat a healthy diet and stay active. You can always lose the weight gained after you quit.  Find ways to improve your mood other than smoking. FOR MORE INFORMATION  www.smokefree.gov  Document Released: 03/12/2001 Document Revised: 08/02/2013 Document Reviewed: 06/27/2011 Exit[employer] Patient Information 2015 [employer]. This information is not intended to replace advice given to you by your health care provider. Make sure you discuss any questions you have with your health care provider.  

## 2014-06-30 ENCOUNTER — Other Ambulatory Visit: Payer: Self-pay | Admitting: Nurse Practitioner

## 2014-07-04 ENCOUNTER — Other Ambulatory Visit: Payer: Self-pay | Admitting: Nurse Practitioner

## 2014-10-04 ENCOUNTER — Other Ambulatory Visit: Payer: Self-pay | Admitting: Nurse Practitioner

## 2014-10-05 NOTE — Telephone Encounter (Signed)
last seen 06/13/14  MMM

## 2014-11-09 ENCOUNTER — Other Ambulatory Visit: Payer: Self-pay | Admitting: Nurse Practitioner

## 2014-11-09 DIAGNOSIS — Z780 Asymptomatic menopausal state: Secondary | ICD-10-CM

## 2014-12-12 ENCOUNTER — Encounter: Payer: Self-pay | Admitting: Pediatrics

## 2014-12-12 ENCOUNTER — Ambulatory Visit (INDEPENDENT_AMBULATORY_CARE_PROVIDER_SITE_OTHER): Payer: Medicare Other | Admitting: Pediatrics

## 2014-12-12 VITALS — BP 132/72 | Temp 98.3°F | Resp 97 | Ht 68.0 in | Wt 162.0 lb

## 2014-12-12 DIAGNOSIS — E785 Hyperlipidemia, unspecified: Secondary | ICD-10-CM

## 2014-12-12 DIAGNOSIS — I10 Essential (primary) hypertension: Secondary | ICD-10-CM | POA: Diagnosis not present

## 2014-12-12 DIAGNOSIS — F17200 Nicotine dependence, unspecified, uncomplicated: Secondary | ICD-10-CM

## 2014-12-12 DIAGNOSIS — Z720 Tobacco use: Secondary | ICD-10-CM | POA: Diagnosis not present

## 2014-12-12 DIAGNOSIS — Z0000 Encounter for general adult medical examination without abnormal findings: Secondary | ICD-10-CM | POA: Diagnosis not present

## 2014-12-12 DIAGNOSIS — Z23 Encounter for immunization: Secondary | ICD-10-CM | POA: Diagnosis not present

## 2014-12-12 NOTE — Patient Instructions (Signed)
You Can Quit Smoking If you are ready to quit smoking or are thinking about it, congratulations! You have chosen to help yourself be healthier and live longer! There are lots of different ways to quit smoking. Nicotine gum, nicotine patches, a nicotine inhaler, or nicotine nasal spray can help with physical craving. Hypnosis, support groups, and medicines help break the habit of smoking. TIPS TO GET OFF AND STAY OFF CIGARETTES  Learn to predict your moods. Do not let a bad situation be your excuse to have a cigarette. Some situations in your life might tempt you to have a cigarette.  Ask friends and co-workers not to smoke around you.  Make your home smoke-free.  Never have "just one" cigarette. It leads to wanting another and another. Remind yourself of your decision to quit.  On a card, make a list of your reasons for not smoking. Read it at least the same number of times a day as you have a cigarette. Tell yourself everyday, "I do not want to smoke. I choose not to smoke."  Ask someone at home or work to help you with your plan to quit smoking.  Have something planned after you eat or have a cup of coffee. Take a walk or get other exercise to perk you up. This will help to keep you from overeating.  Try a relaxation exercise to calm you down and decrease your stress. Remember, you may be tense and nervous the first two weeks after you quit. This will pass.  Find new activities to keep your hands busy. Play with a pen, coin, or rubber band. Doodle or draw things on paper.  Brush your teeth right after eating. This will help cut down the craving for the taste of tobacco after meals. You can try mouthwash too.  Try gum, breath mints, or diet candy to keep something in your mouth. IF YOU SMOKE AND WANT TO QUIT:  Do not stock up on cigarettes. Never buy a carton. Wait until one pack is finished before you buy another.  Never carry cigarettes with you at work or at home.  Keep cigarettes  as far away from you as possible. Leave them with someone else.  Never carry matches or a lighter with you.  Ask yourself, "Do I need this cigarette or is this just a reflex?"  Bet with someone that you can quit. Put cigarette money in a piggy bank every morning. If you smoke, you give up the money. If you do not smoke, by the end of the week, you keep the money.  Keep trying. It takes 21 days to change a habit!  Talk to your doctor about using medicines to help you quit. These include nicotine replacement gum, lozenges, or skin patches. Document Released: 01/12/2009 Document Revised: 06/10/2011 Document Reviewed: 01/12/2009 Exit[employer] Patient Information 2015 [employer]. This information is not intended to replace advice given to you by your health care provider. Make sure you discuss any questions you have with your health care provider.  

## 2014-12-12 NOTE — Progress Notes (Signed)
Subjective:   [name] is a 69 y.o. female who presents for an Initial Medicare Annual Wellness Visit.  Sinus problems ongoing for months.  Active smoker 1ppd  Review of Systems  Review of Systems  Constitutional: Negative for fever, chills and weight loss.  HENT: Positive for congestion and ear discharge. Negative for hearing loss.   Eyes: Negative for blurred vision and double vision.  Respiratory: Positive for cough.   Cardiovascular: Negative for chest pain.  Gastrointestinal: Negative for nausea, vomiting, abdominal pain, diarrhea, constipation and blood in stool.  Genitourinary: Negative for dysuria, urgency and frequency.  Musculoskeletal: Negative for falls.  Skin: Negative for itching and rash.  Neurological: Positive for tingling. Negative for dizziness, speech change, focal weakness and headaches.       Tingling in feet from back surgeries  Endo/Heme/Allergies: Negative for polydipsia.  Psychiatric/Behavioral: Negative for depression and memory loss. The patient is not nervous/anxious and does not have insomnia.      Current Medications (verified) Outpatient Encounter Prescriptions as of 12/12/2014  Medication Sig  . buPROPion (WELLBUTRIN XL) 150 MG 24 hr tablet TAKE ONE TABLET BY MOUTH ONCE DAILY  . hydrochlorothiazide (HYDRODIURIL) 25 MG tablet Take 1 tablet (25 mg total) by mouth daily.  Marland Kitchen lisinopril (PRINIVIL,ZESTRIL) 10 MG tablet Take 1 tablet (10 mg total) by mouth daily.  . naproxen (NAPROSYN) 500 MG tablet Take 1 tablet (500 mg total) by mouth 2 (two) times daily with a meal.  . rosuvastatin (CRESTOR) 10 MG tablet Take 1 tablet (10 mg total) by mouth daily.  . sertraline (ZOLOFT) 50 MG tablet Take 1 tablet (50 mg total) by mouth daily.  . [DISCONTINUED] aspirin 81 MG tablet Take 81 mg by mouth daily.    . [DISCONTINUED] CRESTOR 10 MG tablet TAKE ONE TABLET BY MOUTH ONCE DAILY  . [DISCONTINUED] HYDROcodone-acetaminophen (NORCO) 5-325 MG per tablet Take  1 tablet by mouth every 6 (six) hours as needed for moderate pain.   No facility-administered encounter medications on file as of 12/12/2014.    Allergies (verified) Review of patient's allergies indicates no known allergies.   History: Past Medical History  Diagnosis Date  . Hypertension   . Palpitations   . PVD (peripheral vascular disease)   . Hyperlipidemia   . Hypercholesterolemia   . Tobacco user   . Chronic back pain   . GERD (gastroesophageal reflux disease)   . Osteoarthritis of left knee     End-stage   Past Surgical History  Procedure Laterality Date  . Lumbar fusion  1998    Dr. Jacqulyn Bath  . Angiogram-type unspecified  06/13/00    Dr. Hart Rochester  . Knee arthroscopy  1996    Left  . Finger tendon repair  1988    Right mallet  . Femoral-popliteal bypass graft     Family History  Problem Relation Age of Onset  . Heart failure Father     CHF  . Coronary artery disease Neg Hx     Premature   Social History   Occupational History  . Waiter [employer]   Social History Main Topics  . Smoking status: Current Every Day Smoker -- 1.00 packs/day for 52 years  . Smokeless tobacco: Not on file  . Alcohol Use: No     Comment: Occasionally  . Drug Use: No  . Sexual Activity: Not on file    Do you feel safe at home?  Yes  Dietary issues and exercise activities: Current Exercise Habits:: The patient  does not participate in regular exercise at present  Current Dietary habits: Varied, includes Fruits and vegetables   Objective:    Today's Vitals   12/12/14 1255  BP: 132/72  Temp: 98.3 F (36.8 C)  TempSrc: Oral  Resp: 97  Height: 5\' 8"  (1.727 m)  Weight: 162 lb (73.483 kg)   Body mass index is 24.64 kg/(m^2).  Activities of Daily Living In your present state of health, do you have any difficulty performing the following activities: 12/12/2014  Hearing? N  Vision? N  Difficulty concentrating or making decisions? N  Walking or climbing stairs? Y    Dressing or bathing? N  Doing errands, shopping? N  Preparing Food and eating ? N  Using the Toilet? N  In the past six months, have you accidently leaked urine? N  Do you have problems with loss of bowel control? N  Managing your Medications? N  Managing your Finances? N  Housekeeping or managing your Housekeeping? N    Are there smokers in your home (other than you)? No, pt is a smoker   Cardiac Risk Factors include: advanced age (>63men, >76 women);hypertension;smoking/ tobacco exposure;sedentary lifestyle  Depression Screen PHQ 2/9 Scores 12/12/2014 06/13/2014 01/17/2014 07/20/2013  PHQ - 2 Score 0 0 6 0    Fall Risk Fall Risk  12/12/2014 06/13/2014 01/17/2014 07/20/2013  Falls in the past year? No No No No    Cognitive Function: MMSE - Mini Mental State Exam 12/12/2014  Orientation to time 5  Orientation to Place 5  Registration 3  Attention/ Calculation 5  Recall 3  Language- name 2 objects 2  Language- repeat 1  Language- follow 3 step command 3  Language- read & follow direction 1  Write a sentence 1  Copy design 1  Total score 30    Immunizations and Health Maintenance Immunization History  Administered Date(s) Administered  . Influenza,inj,Quad PF,36+ Mos 02/17/2013, 01/17/2014  . Pneumococcal Polysaccharide-23 02/17/2013  . Tdap 08/02/2013   Health Maintenance Due  Topic Date Due  . Hepatitis C Screening  30-Mar-1946  . COLON CANCER SCREENING ANNUAL FOBT  04/12/1997    Patient Care Team: Bennie Pierini, [name] as PCP - General (Nurse Practitioner)  Indicate any recent Medical Services you may have received from other than [facility] providers in the past year (date may be approximate).    Assessment:    Annual Wellness Visit    Screening Tests Health Maintenance  Topic Date Due  . Hepatitis C Screening  [birth year]  . COLON CANCER SCREENING ANNUAL FOBT  04/12/1997  . MAMMOGRAM  12/14/2014 (Originally 04/12/1997)  . DEXA SCAN  12/14/2014 (Originally  04/12/2012)  . COLONOSCOPY  12/14/2014 (Originally 04/12/1997)  . ZOSTAVAX  12/14/2014 (Originally 04/13/2007)  . PNA vac Low Risk Adult (2 of 2 - PCV13) 12/14/2014 (Originally 02/17/2014)  . INFLUENZA VACCINE  01/11/2015 (Originally 10/31/2014)  . TETANUS/TDAP  08/03/2023        Plan:   During the course of the visit Shiann was educated and counseled about the following appropriate screening and preventive services:   Vaccines to include Pneumoccal, shingles  Colorectal cancer screening--fecal occult cards given today  Cardiovascular disease screening--checking lipid panel today  Diabetes screening--random glucose today  Bone Denisty / Osteoporosis Screening--has had a dexa scan per pt, was normal  Mammogram--declined  PAP--s/o hysterectomy at 69yo  Glaucoma screening-- no eye pain or vision changes  Nutrition counseling  Smoking cessation counseling  Advanced Directives--Does not have HCPOA, a living will or advanced  directives. She is not interested in more information about it today. Would want daughters [name] or Chales Salmon to make decisions if she werent able to make decisions herself.    Goals    None       Patient Instructions (the written plan) were given to the patient.   Johna Sheriff, MD   12/12/2014

## 2014-12-13 ENCOUNTER — Other Ambulatory Visit: Payer: Medicare Other

## 2014-12-13 DIAGNOSIS — Z1212 Encounter for screening for malignant neoplasm of rectum: Secondary | ICD-10-CM

## 2014-12-13 LAB — BMP8+EGFR
BUN/Creatinine Ratio: 11 (ref 11–26)
BUN: 10 mg/dL (ref 8–27)
CO2: 25 mmol/L (ref 18–29)
Calcium: 9.6 mg/dL (ref 8.7–10.3)
Chloride: 98 mmol/L (ref 97–108)
Creatinine, Ser: 0.94 mg/dL (ref 0.57–1.00)
GFR, EST AFRICAN AMERICAN: 73 mL/min/{1.73_m2} (ref >59)
GFR, EST NON AFRICAN AMERICAN: 63 mL/min/{1.73_m2} (ref >59)
Glucose: 88 mg/dL (ref 65–99)
Potassium: 3.8 mmol/L (ref 3.5–5.2)
Sodium: 139 mmol/L (ref 134–144)

## 2014-12-13 LAB — LIPID PANEL
CHOL/HDL RATIO: 4.2 ratio (ref 0.0–4.4)
Cholesterol, Total: 152 mg/dL (ref 100–199)
HDL: 36 mg/dL — ABNORMAL LOW (ref >39)
LDL CALC: 71 mg/dL (ref 0–99)
TRIGLYCERIDES: 226 mg/dL — AB (ref 0–149)
VLDL CHOLESTEROL CAL: 45 mg/dL — AB (ref 5–40)

## 2014-12-13 NOTE — Progress Notes (Signed)
Lab only 

## 2014-12-15 LAB — FECAL OCCULT BLOOD, IMMUNOCHEMICAL: FECAL OCCULT BLD: NEGATIVE

## 2014-12-19 ENCOUNTER — Telehealth: Payer: Self-pay | Admitting: *Deleted

## 2014-12-19 NOTE — Telephone Encounter (Signed)
-----   Message from Johna Sheriff, MD sent at 12/16/2014  5:53 PM EDT ----- Fecal card sent in was negative for blood. This test should be repeated once every year.

## 2014-12-19 NOTE — Telephone Encounter (Signed)
Patient aware of lab results.

## 2015-01-02 ENCOUNTER — Other Ambulatory Visit: Payer: Self-pay | Admitting: Nurse Practitioner

## 2015-01-02 ENCOUNTER — Other Ambulatory Visit: Payer: Self-pay | Admitting: Family

## 2015-01-03 NOTE — Telephone Encounter (Signed)
Last seen 06/13/14 MMM 

## 2015-01-27 DIAGNOSIS — Z981 Arthrodesis status: Secondary | ICD-10-CM | POA: Insufficient documentation

## 2015-02-02 ENCOUNTER — Other Ambulatory Visit: Payer: Self-pay | Admitting: Nurse Practitioner

## 2015-02-02 ENCOUNTER — Other Ambulatory Visit: Payer: Self-pay | Admitting: Family

## 2015-02-27 ENCOUNTER — Ambulatory Visit (INDEPENDENT_AMBULATORY_CARE_PROVIDER_SITE_OTHER): Payer: Medicare Other

## 2015-02-27 DIAGNOSIS — Z23 Encounter for immunization: Secondary | ICD-10-CM

## 2015-03-06 ENCOUNTER — Other Ambulatory Visit: Payer: Self-pay | Admitting: Nurse Practitioner

## 2015-04-06 ENCOUNTER — Other Ambulatory Visit: Payer: Self-pay | Admitting: Nurse Practitioner

## 2015-05-06 ENCOUNTER — Other Ambulatory Visit: Payer: Self-pay | Admitting: Pediatrics

## 2015-05-08 NOTE — Telephone Encounter (Signed)
Last seen 06/13/14 MMM 

## 2015-06-05 ENCOUNTER — Other Ambulatory Visit: Payer: Self-pay | Admitting: Pediatrics

## 2015-06-05 ENCOUNTER — Other Ambulatory Visit: Payer: Self-pay | Admitting: Nurse Practitioner

## 2015-07-07 ENCOUNTER — Other Ambulatory Visit: Payer: Self-pay | Admitting: Nurse Practitioner

## 2015-07-07 ENCOUNTER — Other Ambulatory Visit: Payer: Self-pay | Admitting: Pediatrics

## 2015-07-24 ENCOUNTER — Encounter (INDEPENDENT_AMBULATORY_CARE_PROVIDER_SITE_OTHER): Payer: Self-pay

## 2015-07-24 ENCOUNTER — Encounter: Payer: Self-pay | Admitting: Family

## 2015-07-24 ENCOUNTER — Ambulatory Visit (INDEPENDENT_AMBULATORY_CARE_PROVIDER_SITE_OTHER): Payer: Medicare Other | Admitting: Family

## 2015-07-24 VITALS — BP 144/72 | HR 76 | Temp 99.5°F | Ht 68.0 in | Wt 165.0 lb

## 2015-07-24 DIAGNOSIS — F411 Generalized anxiety disorder: Secondary | ICD-10-CM | POA: Diagnosis not present

## 2015-07-24 DIAGNOSIS — F329 Major depressive disorder, single episode, unspecified: Secondary | ICD-10-CM

## 2015-07-24 DIAGNOSIS — F32A Depression, unspecified: Secondary | ICD-10-CM

## 2015-07-24 MED ORDER — ALPRAZOLAM 0.25 MG PO TABS: 0.2500 mg | ORAL_TABLET | Freq: Two times a day (BID) | ORAL | Status: DC | PRN

## 2015-07-24 MED ORDER — ESCITALOPRAM OXALATE 10 MG PO TABS: 10.0000 mg | ORAL_TABLET | Freq: Every day | ORAL | Status: DC

## 2015-07-24 NOTE — Progress Notes (Signed)
   Subjective:    Patient ID: Alvina FilbertLinda S Turner, female    DOB: Apr 23, 1945, 70 y.o.   MRN: [id number]  PT presents to the office to discuss GAD and depression. Pt states her sister was just diagnosed with pancreatic cancer and is in the hospital. Pt states she can not stop crying and every times she visits her sister she starts to cry and this makes her sister upset. Pt states she just can not "deal with this".  Depression        This is a chronic problem.  The current episode started more than 1 year ago.   The onset quality is gradual.   Associated symptoms include helplessness, hopelessness, decreased interest and sad.  Associated symptoms include not irritable and no suicidal ideas.  Past treatments include TCAs - Tricyclic antidepressants.  Compliance with treatment is good.  Past medical history includes anxiety.   Anxiety Presents for follow-up visit. Symptoms include depressed mood, excessive worry, irritability, nervous/anxious behavior, palpitations and panic. Patient reports no suicidal ideas. Symptoms occur constantly. The severity of symptoms is moderate. The symptoms are aggravated by family issues. The quality of sleep is good.        Review of Systems  Constitutional: Positive for irritability.  Cardiovascular: Positive for palpitations.  Psychiatric/Behavioral: Positive for depression. Negative for suicidal ideas. The patient is nervous/anxious.   All other systems reviewed and are negative.      Objective:   Physical Exam  Constitutional: She is oriented to person, place, and time. She appears well-developed and well-nourished. She is not irritable. No distress.  Eyes: Pupils are equal, round, and reactive to light.  Neck: Normal range of motion. Neck supple. No thyromegaly present.  Cardiovascular: Normal rate, regular rhythm, normal heart sounds and intact distal pulses.   No murmur heard. Pulmonary/Chest: Effort normal and breath sounds normal. No respiratory  distress. She has no wheezes.  Abdominal: Soft. Bowel sounds are normal. She exhibits no distension. There is no tenderness.  Musculoskeletal: Normal range of motion. She exhibits no edema or tenderness.  Neurological: She is alert and oriented to person, place, and time.  Skin: Skin is warm and dry.  Psychiatric: Her behavior is normal. Judgment and thought content normal. Her mood appears anxious.  PT tearful and crying   Vitals reviewed.   BP 144/72 mmHg  Pulse 76  Temp(Src) 99.5 F (37.5 C) (Oral)  Ht 5\' 8"  (1.727 m)  Wt 165 lb (74.844 kg)  BMI 25.09 kg/m2       Assessment & Plan:  1. GAD (generalized anxiety disorder) - escitalopram (LEXAPRO) 10 MG tablet; Take 1 tablet (10 mg total) by mouth daily.  Dispense: 90 tablet; Refill: 3 - ALPRAZolam (XANAX) 0.25 MG tablet; Take 1 tablet (0.25 mg total) by mouth 2 (two) times daily as needed for anxiety.  Dispense: 20 tablet; Refill: 0  2. Depression - escitalopram (LEXAPRO) 10 MG tablet; Take 1 tablet (10 mg total) by mouth daily.  Dispense: 90 tablet; Refill: 3 - ALPRAZolam (XANAX) 0.25 MG tablet; Take 1 tablet (0.25 mg total) by mouth 2 (two) times daily as needed for anxiety.  Dispense: 20 tablet; Refill: 0  -Pt started on lexapro today and told to continue Wellbutrin -PT given rx of xanax to take as needed and told we could not give this to her long term, but as needed until she can cope better -Stress management discussed -RTO in 4 weeks  Jannifer Rodneyhristy [name], [name]

## 2015-07-24 NOTE — Patient Instructions (Signed)
Generalized Anxiety Disorder Generalized anxiety disorder (GAD) is a mental disorder. It interferes with life functions, including relationships, work, and school. GAD is different from normal anxiety, which everyone experiences at some point in their lives in response to specific life events and activities. Normal anxiety actually helps us prepare for and get through these life events and activities. Normal anxiety goes away after the event or activity is over.  GAD causes anxiety that is not necessarily related to specific events or activities. It also causes excess anxiety in proportion to specific events or activities. The anxiety associated with GAD is also difficult to control. GAD can vary from mild to severe. People with severe GAD can have intense waves of anxiety with physical symptoms (panic attacks).  SYMPTOMS The anxiety and worry associated with GAD are difficult to control. This anxiety and worry are related to many life events and activities and also occur more days than not for 6 months or longer. People with GAD also have three or more of the following symptoms (one or more in children):  Restlessness.   Fatigue.  Difficulty concentrating.   Irritability.  Muscle tension.  Difficulty sleeping or unsatisfying sleep. DIAGNOSIS GAD is diagnosed through an assessment by your health care provider. Your health care provider will ask you questions aboutyour mood,physical symptoms, and events in your life. Your health care provider may ask you about your medical history and use of alcohol or drugs, including prescription medicines. Your health care provider may also do a physical exam and blood tests. Certain medical conditions and the use of certain substances can cause symptoms similar to those associated with GAD. Your health care provider may refer you to a mental health specialist for further evaluation. TREATMENT The following therapies are usually used to treat GAD:    Medication. Antidepressant medication usually is prescribed for long-term daily control. Antianxiety medicines may be added in severe cases, especially when panic attacks occur.   Talk therapy (psychotherapy). Certain types of talk therapy can be helpful in treating GAD by providing support, education, and guidance. A form of talk therapy called cognitive behavioral therapy can teach you healthy ways to think about and react to daily life events and activities.  Stress managementtechniques. These include yoga, meditation, and exercise and can be very helpful when they are practiced regularly. A mental health specialist can help determine which treatment is best for you. Some people see improvement with one therapy. However, other people require a combination of therapies.   This information is not intended to replace advice given to you by your health care provider. Make sure you discuss any questions you have with your health care provider.   Document Released: 07/13/2012 Document Revised: 04/08/2014 Document Reviewed: 07/13/2012 Elsevier Interactive Patient Education 2016 [employer]  

## 2015-08-09 ENCOUNTER — Other Ambulatory Visit: Payer: Self-pay | Admitting: Nurse Practitioner

## 2015-08-09 ENCOUNTER — Other Ambulatory Visit: Payer: Self-pay | Admitting: Pediatrics

## 2015-08-09 ENCOUNTER — Other Ambulatory Visit: Payer: Self-pay | Admitting: Family

## 2015-10-17 ENCOUNTER — Encounter: Payer: Self-pay | Admitting: Nurse Practitioner

## 2015-11-12 ENCOUNTER — Other Ambulatory Visit: Payer: Self-pay | Admitting: Family

## 2015-11-12 ENCOUNTER — Other Ambulatory Visit: Payer: Self-pay | Admitting: Nurse Practitioner

## 2015-12-14 ENCOUNTER — Other Ambulatory Visit: Payer: Self-pay | Admitting: Nurse Practitioner

## 2016-01-15 ENCOUNTER — Other Ambulatory Visit: Payer: Self-pay | Admitting: Family

## 2016-01-16 NOTE — Telephone Encounter (Signed)
Last refill without being seen 

## 2016-02-13 ENCOUNTER — Other Ambulatory Visit: Payer: Self-pay | Admitting: Nurse Practitioner

## 2016-02-13 NOTE — Telephone Encounter (Signed)
Patient aware that she will need to be seen for medication refill.  Appt made

## 2016-02-13 NOTE — Telephone Encounter (Signed)
Patient NTBS for follow up and lab work before medications are refilled.

## 2016-02-15 ENCOUNTER — Encounter: Payer: Self-pay | Admitting: Family

## 2016-02-15 ENCOUNTER — Ambulatory Visit (INDEPENDENT_AMBULATORY_CARE_PROVIDER_SITE_OTHER): Payer: Medicare Other | Admitting: Family

## 2016-02-15 VITALS — BP 134/75 | HR 70 | Temp 98.0°F | Ht 68.0 in | Wt 168.4 lb

## 2016-02-15 DIAGNOSIS — I739 Peripheral vascular disease, unspecified: Secondary | ICD-10-CM | POA: Diagnosis not present

## 2016-02-15 DIAGNOSIS — G8929 Other chronic pain: Secondary | ICD-10-CM

## 2016-02-15 DIAGNOSIS — I10 Essential (primary) hypertension: Secondary | ICD-10-CM | POA: Diagnosis not present

## 2016-02-15 DIAGNOSIS — Z1211 Encounter for screening for malignant neoplasm of colon: Secondary | ICD-10-CM | POA: Diagnosis not present

## 2016-02-15 DIAGNOSIS — K219 Gastro-esophageal reflux disease without esophagitis: Secondary | ICD-10-CM

## 2016-02-15 DIAGNOSIS — M5441 Lumbago with sciatica, right side: Secondary | ICD-10-CM | POA: Diagnosis not present

## 2016-02-15 DIAGNOSIS — M5442 Lumbago with sciatica, left side: Secondary | ICD-10-CM

## 2016-02-15 DIAGNOSIS — F411 Generalized anxiety disorder: Secondary | ICD-10-CM

## 2016-02-15 DIAGNOSIS — E785 Hyperlipidemia, unspecified: Secondary | ICD-10-CM | POA: Diagnosis not present

## 2016-02-15 DIAGNOSIS — F331 Major depressive disorder, recurrent, moderate: Secondary | ICD-10-CM

## 2016-02-15 DIAGNOSIS — F17200 Nicotine dependence, unspecified, uncomplicated: Secondary | ICD-10-CM | POA: Diagnosis not present

## 2016-02-15 MED ORDER — MELOXICAM 15 MG PO TABS: 15.0000 mg | ORAL_TABLET | Freq: Every day | ORAL | 1 refills | Status: DC

## 2016-02-15 MED ORDER — ESCITALOPRAM OXALATE 10 MG PO TABS: 10.0000 mg | ORAL_TABLET | Freq: Every day | ORAL | 3 refills | Status: DC

## 2016-02-15 MED ORDER — TIZANIDINE HCL 4 MG PO TABS: 4.0000 mg | ORAL_TABLET | Freq: Three times a day (TID) | ORAL | 1 refills | Status: DC | PRN

## 2016-02-15 MED ORDER — BUPROPION HCL ER (XL) 150 MG PO TB24: 150.0000 mg | ORAL_TABLET | Freq: Every day | ORAL | 1 refills | Status: DC

## 2016-02-15 MED ORDER — LISINOPRIL 10 MG PO TABS: 10.0000 mg | ORAL_TABLET | Freq: Every day | ORAL | 1 refills | Status: DC

## 2016-02-15 MED ORDER — ROSUVASTATIN CALCIUM 10 MG PO TABS: 10.0000 mg | ORAL_TABLET | Freq: Every day | ORAL | 0 refills | Status: DC

## 2016-02-15 MED ORDER — GABAPENTIN 300 MG PO CAPS: 300.0000 mg | ORAL_CAPSULE | Freq: Three times a day (TID) | ORAL | 1 refills | Status: DC

## 2016-02-15 MED ORDER — HYDROCHLOROTHIAZIDE 25 MG PO TABS: 25.0000 mg | ORAL_TABLET | Freq: Every day | ORAL | 1 refills | Status: DC

## 2016-02-15 NOTE — Progress Notes (Signed)
Subjective:    Patient ID: [name], female    DOB: Feb 20, 1946, 70 y.o.   MRN: [id number]  PT presents to the office today for chronic follow up and lab work.  Depression         This is a chronic problem.  The current episode started more than 1 year ago.   The onset quality is gradual.   The problem occurs intermittently.  Associated symptoms include irritable, decreased interest and sad.  Associated symptoms include no helplessness, no hopelessness and no suicidal ideas.  Past treatments include TCAs - Tricyclic antidepressants.  Compliance with treatment is good.  Past medical history includes anxiety.     Pertinent negatives include no thyroid problem. Anxiety  Presents for follow-up visit. Symptoms include depressed mood, irritability, nervous/anxious behavior and palpitations. Patient reports no excessive worry, panic, shortness of breath or suicidal ideas. Symptoms occur constantly. The severity of symptoms is moderate. The symptoms are aggravated by family issues. The quality of sleep is good.    Hyperlipidemia  This is a chronic problem. The current episode started more than 1 year ago. The problem is uncontrolled. Recent lipid tests were reviewed and are normal. Associated symptoms include leg pain. Pertinent negatives include no shortness of breath. Current antihyperlipidemic treatment includes statins. The current treatment provides moderate improvement of lipids. Risk factors for coronary artery disease include dyslipidemia, hypertension, family history and a sedentary lifestyle.  Hypertension  This is a chronic problem. The current episode started more than 1 year ago. The problem has been resolved since onset. The problem is controlled. Associated symptoms include anxiety and palpitations. Pertinent negatives include no peripheral edema or shortness of breath. Risk factors for coronary artery disease include dyslipidemia, family history, post-menopausal state and  smoking/tobacco exposure. Past treatments include diuretics. The current treatment provides moderate improvement. There is no history of kidney disease, CAD/MI, CVA, heart failure or a thyroid problem.  Back Pain  This is a chronic problem. The current episode started more than 1 year ago. The problem occurs intermittently. The problem has been waxing and waning since onset. The pain is present in the lumbar spine. The quality of the pain is described as aching. The pain is at a severity of 8/10. The pain is moderate. The symptoms are aggravated by standing. Associated symptoms include leg pain and numbness. Pertinent negatives include no bladder incontinence or bowel incontinence. She has tried bed rest and muscle relaxant for the symptoms. The treatment provided mild relief.  PVD Pt currently smoking a pack a day. PT has no desire to quit at this time. PT has intermittent pain in left leg while walking of 10 ou t10.    Review of Systems  Constitutional: Positive for irritability.  Respiratory: Negative for shortness of breath.   Cardiovascular: Positive for palpitations.  Gastrointestinal: Negative for bowel incontinence.  Genitourinary: Negative for bladder incontinence.  Musculoskeletal: Positive for back pain.  Neurological: Positive for numbness.  Psychiatric/Behavioral: Positive for depression. Negative for suicidal ideas. The patient is nervous/anxious.   All other systems reviewed and are negative.      Objective:   Physical Exam  Constitutional: She is oriented to person, place, and time. She appears well-developed and well-nourished. She is irritable. No distress.  HENT:  Head: Normocephalic and atraumatic.  Right Ear: External ear normal.  Mouth/Throat: Oropharynx is clear and moist.  Eyes: Pupils are equal, round, and reactive to light.  Neck: Normal range of motion. Neck supple. No thyromegaly present.  Cardiovascular: Normal rate, regular rhythm, normal heart sounds and  intact distal pulses.   No murmur heard. Pulmonary/Chest: Effort normal and breath sounds normal. No respiratory distress. She has no wheezes.  Abdominal: Soft. Bowel sounds are normal. She exhibits no distension. There is no tenderness.  Musculoskeletal: Normal range of motion. She exhibits no edema or tenderness.  Neurological: She is alert and oriented to person, place, and time. She has normal reflexes. No cranial nerve deficit.  Skin: Skin is warm and dry.  Psychiatric: She has a normal mood and affect. Her behavior is normal. Judgment and thought content normal. Her mood appears not anxious.     Vitals reviewed.   BP 134/75   Pulse 70   Temp 98 F (36.7 C) (Oral)   Ht _0  (1.727 m)   Wt 168 lb 6.4 oz (76.4 kg)   BMI 25.61 kg/m        Assessment & Plan:  1. Essential hypertension - CMP14+EGFR - hydrochlorothiazide (HYDRODIURIL) 25 MG tablet; Take 1 tablet (25 mg total) by mouth daily.  Dispense: 90 tablet; Refill: 1 - lisinopril (PRINIVIL,ZESTRIL) 10 MG tablet; Take 1 tablet (10 mg total) by mouth daily.  Dispense: 90 tablet; Refill: 1  2. Peripheral vascular disease (HCC) - CMP14+EGFR - Lipid panel - gabapentin (NEURONTIN) 300 MG capsule; Take 1 capsule (300 mg total) by mouth 3 (three) times daily.  Dispense: 270 capsule; Refill: 1  3. Gastroesophageal reflux disease without esophagitis - CMP14+EGFR  4. TOBACCO ABUSE - CMP14+EGFR  5. GAD (generalized anxiety disorder)  - CMP14+EGFR - buPROPion (WELLBUTRIN XL) 150 MG 24 hr tablet; Take 1 tablet (150 mg total) by mouth daily.  Dispense: 90 tablet; Refill: 1 - escitalopram (LEXAPRO) 10 MG tablet; Take 1 tablet (10 mg total) by mouth daily.  Dispense: 90 tablet; Refill: 3  6. Moderate episode of recurrent major depressive disorder (HCC) - CMP14+EGFR - buPROPion (WELLBUTRIN XL) 150 MG 24 hr tablet; Take 1 tablet (150 mg total) by mouth daily.  Dispense: 90 tablet; Refill: 1  7. Hyperlipidemia with target LDL  less than 100 - CMP14+EGFR - Lipid panel - rosuvastatin (CRESTOR) 10 MG tablet; Take 1 tablet (10 mg total) by mouth daily.  Dispense: 90 tablet; Refill: 0  8. Colon cancer screening  - CMP14+EGFR - Fecal occult blood, imunochemical; Future  9. Chronic bilateral low back pain with bilateral sciatica  - gabapentin (NEURONTIN) 300 MG capsule; Take 1 capsule (300 mg total) by mouth 3 (three) times daily.  Dispense: 270 capsule; Refill: 1 - meloxicam (MOBIC) 15 MG tablet; Take 1 tablet (15 mg total) by mouth daily.  Dispense: 90 tablet; Refill: 1 - tiZANidine (ZANAFLEX) 4 MG tablet; Take 1-2 tablets (4-8 mg total) by mouth every 8 (eight) hours as needed for muscle spasms.  Dispense: 90 tablet; Refill: 1   Continue all meds Labs pending Health Maintenance reviewed Diet and exercise encouraged RTO 6 months  Evelina Dun, [name]

## 2016-02-15 NOTE — Patient Instructions (Signed)

## 2016-02-16 LAB — CMP14+EGFR
A/G RATIO: 1.7 (ref 1.2–2.2)
ALK PHOS: 105 IU/L (ref 39–117)
ALT: 13 IU/L (ref 0–32)
AST: 24 IU/L (ref 0–40)
Albumin: 4.3 g/dL (ref 3.6–4.8)
BUN / CREAT RATIO: 15 (ref 12–28)
BUN: 17 mg/dL (ref 8–27)
CHLORIDE: 99 mmol/L (ref 96–106)
CO2: 25 mmol/L (ref 18–29)
Calcium: 8.9 mg/dL (ref 8.7–10.3)
Creatinine, Ser: 1.16 mg/dL — ABNORMAL HIGH (ref 0.57–1.00)
GFR calc non Af Amer: 48 mL/min/{1.73_m2} — ABNORMAL LOW (ref >59)
GFR, EST AFRICAN AMERICAN: 56 mL/min/{1.73_m2} — AB (ref >59)
Globulin, Total: 2.6 g/dL (ref 1.5–4.5)
Glucose: 80 mg/dL (ref 65–99)
POTASSIUM: 4.2 mmol/L (ref 3.5–5.2)
Sodium: 141 mmol/L (ref 134–144)
TOTAL PROTEIN: 6.9 g/dL (ref 6.0–8.5)

## 2016-02-16 LAB — LIPID PANEL
CHOLESTEROL TOTAL: 160 mg/dL (ref 100–199)
Chol/HDL Ratio: 3.8 ratio units (ref 0.0–4.4)
HDL: 42 mg/dL (ref >39)
LDL Calculated: 81 mg/dL (ref 0–99)
Triglycerides: 184 mg/dL — ABNORMAL HIGH (ref 0–149)
VLDL Cholesterol Cal: 37 mg/dL (ref 5–40)

## 2016-03-08 ENCOUNTER — Other Ambulatory Visit: Payer: Self-pay

## 2016-03-18 ENCOUNTER — Other Ambulatory Visit: Payer: Self-pay | Admitting: Family

## 2016-05-23 ENCOUNTER — Other Ambulatory Visit: Payer: Self-pay | Admitting: Family

## 2016-06-07 ENCOUNTER — Encounter: Payer: Self-pay | Admitting: Pediatrics

## 2016-06-07 ENCOUNTER — Ambulatory Visit (INDEPENDENT_AMBULATORY_CARE_PROVIDER_SITE_OTHER): Payer: Medicare Other

## 2016-06-07 ENCOUNTER — Ambulatory Visit (INDEPENDENT_AMBULATORY_CARE_PROVIDER_SITE_OTHER): Payer: Medicare Other | Admitting: Pediatrics

## 2016-06-07 VITALS — BP 146/76 | HR 64 | Temp 97.5°F | Ht 68.0 in | Wt 167.4 lb

## 2016-06-07 DIAGNOSIS — M79671 Pain in right foot: Secondary | ICD-10-CM

## 2016-06-07 DIAGNOSIS — S92354A Nondisplaced fracture of fifth metatarsal bone, right foot, initial encounter for closed fracture: Secondary | ICD-10-CM | POA: Diagnosis not present

## 2016-06-07 NOTE — Progress Notes (Signed)
  Subjective:   Patient ID: [name], female    DOB: 1945-06-26, 71 y.o.   MRN: [id number] CC: Foot Pain (Right1 month)  HPI: [name] is a 71 y.o. female presenting for Foot Pain (Right1 month)  A few weeks ago started having lateral R foot pain Says it was there when she woke up one day Does not remember injuring it Has not gotten any better Maybe slightly worse Has been slightly red, slightly swollen Hurts on top of foot too lateral side Thought it was her shoes at first, didn't improve with different shoes  No prior foot injuries  Relevant past medical, surgical, family and social history reviewed. Allergies and medications reviewed and updated. History  Smoking Status  . Current Every Day Smoker  . Packs/day: 1.00  . Years: 52.00  Smokeless Tobacco  . Not on file   ROS: Per HPI   Objective:    BP (!) 146/76   Pulse 64   Temp 97.5 F (36.4 C) (Oral)   Ht 5\' 8"  (1.727 m)   Wt 167 lb 6.4 oz (75.9 kg)   BMI 25.45 kg/m   Wt Readings from Last 3 Encounters:  06/07/16 167 lb 6.4 oz (75.9 kg)  02/15/16 168 lb 6.4 oz (76.4 kg)  07/24/15 165 lb (74.8 kg)    Gen: NAD, alert, cooperative with exam, NCAT EYES: EOMI, no conjunctival injection, or no icterus CV: WWP, R DP pulses 2+ Resp: normal WOB Neuro: Alert and oriented MSK: normal muscle bulk  Assessment & Plan:  [name] was seen today for foot pain, ongoin g amonth  Diagnoses and all orders for this visit:  Right foot pain -     DG Foot Complete Right; Future  Closed nondisplaced fracture of fifth metatarsal bone of right foot, initial encounter Gave Rx for post-op shoe Has had pain for a month, not healing, will refer to ortho -     Ambulatory referral to Orthopedic Surgery  Follow up plan: prn Rex Krasarol Akim Watkinson, MD [facility]

## 2016-07-08 ENCOUNTER — Other Ambulatory Visit (HOSPITAL_COMMUNITY): Payer: Self-pay | Admitting: Sports Medicine

## 2016-07-08 DIAGNOSIS — T148XXA Other injury of unspecified body region, initial encounter: Secondary | ICD-10-CM

## 2016-07-08 DIAGNOSIS — M810 Age-related osteoporosis without current pathological fracture: Secondary | ICD-10-CM

## 2016-07-08 DIAGNOSIS — Z780 Asymptomatic menopausal state: Secondary | ICD-10-CM

## 2016-07-11 ENCOUNTER — Ambulatory Visit (HOSPITAL_COMMUNITY)
Admission: RE | Admit: 2016-07-11 | Discharge: 2016-07-11 | Disposition: A | Discharge status: Home / Self Care | Payer: Medicare Other | Source: Ambulatory Visit | Attending: Sports Medicine | Admitting: Sports Medicine

## 2016-07-11 DIAGNOSIS — T148XXA Other injury of unspecified body region, initial encounter: Secondary | ICD-10-CM

## 2016-07-11 DIAGNOSIS — S92354A Nondisplaced fracture of fifth metatarsal bone, right foot, initial encounter for closed fracture: Secondary | ICD-10-CM | POA: Diagnosis not present

## 2016-07-11 DIAGNOSIS — M810 Age-related osteoporosis without current pathological fracture: Secondary | ICD-10-CM | POA: Insufficient documentation

## 2016-07-11 DIAGNOSIS — Z780 Asymptomatic menopausal state: Secondary | ICD-10-CM | POA: Insufficient documentation

## 2016-07-11 DIAGNOSIS — X58XXXA Exposure to other specified factors, initial encounter: Secondary | ICD-10-CM | POA: Insufficient documentation

## 2016-07-30 DIAGNOSIS — M5416 Radiculopathy, lumbar region: Secondary | ICD-10-CM | POA: Insufficient documentation

## 2016-08-13 ENCOUNTER — Other Ambulatory Visit: Payer: Self-pay | Admitting: Family

## 2016-08-13 DIAGNOSIS — E785 Hyperlipidemia, unspecified: Secondary | ICD-10-CM

## 2016-08-14 NOTE — Telephone Encounter (Signed)
Refill denied- needs follow up with lab work

## 2016-08-15 NOTE — Telephone Encounter (Signed)
[facility] NTBS

## 2016-08-28 ENCOUNTER — Ambulatory Visit (INDEPENDENT_AMBULATORY_CARE_PROVIDER_SITE_OTHER): Payer: Medicare Other | Admitting: *Deleted

## 2016-08-28 VITALS — BP 113/66 | HR 75 | Temp 97.3°F | Ht 67.0 in | Wt 165.0 lb

## 2016-08-28 DIAGNOSIS — Z0000 Encounter for general adult medical examination without abnormal findings: Secondary | ICD-10-CM | POA: Diagnosis not present

## 2016-08-28 NOTE — Progress Notes (Signed)
Subjective:   Alvina FilbertLinda S Kuhar is a 71 y.o. female who presents for Medicare Annual (Subsequent) preventive examination. [name] lives at home by herself with her Binnie [place]and[place]huahua Farrel Demark(Frosty).  Her mother and two cousins live close by and visits often.   [name] previously worked at [employer] in [place] years and has recently quit due to conflicts at work.  [name] enjoys playing computer games and watching Soap operas.  She currently eat 3 meals daily that consist of eggs, bacon, biscuits and sandwiches.  She does not exercise at this time due to back pain.   Overall Ms. Mash feels that her health is the same as it was a year ago.  Objective:     Vitals: BP 113/66   Pulse 75   Temp 97.3 F (36.3 C) (Oral)   Ht 5\' 7"  (1.702 m)   Wt 165 lb (74.8 kg)   BMI 25.84 kg/m   Body mass index is 25.84 kg/m.   Tobacco History  Smoking Status  . Current Every Day Smoker  . Packs/day: 1.00  . Years: 52.00  . Types: Cigarettes  Smokeless Tobacco  . Never Used     Ready to quit: No Counseling given: Yes   Past Medical History:  Diagnosis Date  . Chronic back pain   . GERD (gastroesophageal reflux disease)   . Hypercholesterolemia   . Hyperlipidemia   . Hypertension   . Osteoarthritis of left knee    End-stage  . Palpitations   . PVD (peripheral vascular disease) (HCC)   . Tobacco user    Past Surgical History:  Procedure Laterality Date  . Angiogram-type unspecified  06/13/00   Dr. Hart RochesterLawson  . FEMORAL-POPLITEAL BYPASS GRAFT    . FINGER TENDON REPAIR  1988   Right mallet  . KNEE ARTHROSCOPY  1996   Left  . LUMBAR FUSION  1998   Dr. Jacqulyn BathLong   Family History  Problem Relation Age of Onset  . Heart failure Father        CHF  . Hypertension Father   . Heart disease Father   . Hypertension Paternal Aunt   . Hypertension Paternal Uncle   . Hypertension Paternal Grandmother   . Hypertension Paternal Grandfather   . Coronary artery disease Neg Hx    Premature   History  Sexual Activity  . Sexual activity: No    Outpatient Encounter Prescriptions as of 08/28/2016  Medication Sig  . gabapentin (NEURONTIN) 300 MG capsule Take 1 capsule (300 mg total) by mouth 3 (three) times daily.  . hydrochlorothiazide (HYDRODIURIL) 25 MG tablet Take 1 tablet (25 mg total) by mouth daily.  Marland Kitchen. lisinopril (PRINIVIL,ZESTRIL) 10 MG tablet Take 1 tablet (10 mg total) by mouth daily.  . meloxicam (MOBIC) 15 MG tablet Take 1 tablet (15 mg total) by mouth daily.  . tapentadol (NUCYNTA) 50 MG 12 hr tablet Take 50 mg by mouth.  Marland Kitchen. tiZANidine (ZANAFLEX) 4 MG tablet Take 1-2 tablets (4-8 mg total) by mouth every 8 (eight) hours as needed for muscle spasms.  . [DISCONTINUED] tiZANidine (ZANAFLEX) 4 MG tablet Take 4 mg by mouth.  . [DISCONTINUED] escitalopram (LEXAPRO) 10 MG tablet Take 1 tablet (10 mg total) by mouth daily. (Patient not taking: Reported on 08/28/2016)  . [DISCONTINUED] rosuvastatin (CRESTOR) 10 MG tablet Take 1 tablet (10 mg total) by mouth daily. (Patient not taking: Reported on 08/28/2016)   No facility-administered encounter medications on file as of 08/28/2016.     Activities  of Daily Living In your present state of health, do you have any difficulty performing the following activities: 08/28/2016  Hearing? N  Vision? N  Difficulty concentrating or making decisions? N  Walking or climbing stairs? Y  Dressing or bathing? N  Doing errands, shopping? N  Some recent data might be hidden  Difficulty climbing stairs and excessive walking due to chronic back pain   Patient Care Team: Bennie Pierini, [name] as PCP - General (Nurse Practitioner) Melina Fiddler, MD as Consulting Physician (Sports Medicine)    Assessment:    Exercise Activities and Dietary recommendations Current Exercise Habits: The patient does not participate in regular exercise at present, Exercise limited by: orthopedic condition(s) Chronic Back Pain, Seated  Strengthening Exercise handout given.  Goals    . Exercise 3x per week (30 min per time)          Increase exercise 3 times weekly at least 30 mins daily Seated Strengthening exercises     . Have 3 meals a day          Eat more Lean protein, fruits and vegetables       Fall Risk Fall Risk  08/28/2016 06/07/2016 03/08/2016 02/15/2016 12/12/2014  Falls in the past year? No No No No No   Depression Screen PHQ 2/9 Scores 08/28/2016 06/07/2016 02/15/2016 12/12/2014  PHQ - 2 Score 6 0 0 0  PHQ- 9 Score 23 - - -   Currently on treatment will follow up with Mary-Margaret Daphine Deutscher, [name]  Cognitive Function MMSE - Mini Mental State Exam 08/28/2016 12/12/2014  Orientation to time 5 5  Orientation to Place 5 5  Registration 3 3  Attention/ Calculation 5 5  Recall 3 3  Language- name 2 objects 2 2  Language- repeat 1 1  Language- follow 3 step command 3 3  Language- read & follow direction 1 1  Write a sentence 1 1  Copy design 1 1  Total score 30 30  No Memory loss at this time      Immunization History  Administered Date(s) Administered  . Influenza,inj,Quad PF,36+ Mos 02/17/2013, 01/17/2014, 02/27/2015  . Pneumococcal Conjugate-13 12/12/2014  . Pneumococcal Polysaccharide-23 02/17/2013  . Tdap 08/02/2013  Will have shingrix vaccine at follow up on 09/02/2016 Screening Tests Health Maintenance  Topic Date Due  . COLON CANCER SCREENING ANNUAL FOBT  12/16/2015  . MAMMOGRAM  12/12/2016 (Originally 04/12/1997)  . INFLUENZA VACCINE  10/30/2016  . TETANUS/TDAP  08/03/2023  . DEXA SCAN  Completed  . Hepatitis C Screening  Completed  . PNA vac Low Risk Adult  Completed  Declines colonoscopy and Mammogram.      Plan:   Encouraged Ms. Tooley to keep follow up appointment with Mary-Margaret Daphine Deutscher,  [name].  Encouraged patient to eat 3 meals daily that consist of lean protein, fruits and vegetables.  Seated strengthening exercise handout given and encourage to attempt as tolerated due to  back pain.  Explained the importance of colonoscopy, mammogram and eye exam but patient declines all at this time. Ms. Bahner will check on Shingrix vaccine at follow up appointment. All other vaccines and health maintenance is up to date at this time.  Patient aware to call this office with any further questions or concerns.   I have personally reviewed and noted the following in the patient's chart:   . Medical and social history . Use of alcohol, tobacco or illicit drugs  . Current medications and supplements . Functional ability and status . Nutritional  status . Physical activity . Advanced directives . List of other physicians . Hospitalizations, surgeries, and ER visits in previous 12 months . Vitals . Screenings to include cognitive, depression, and falls . Referrals and appointments  In addition, I have reviewed and discussed with patient certain preventive protocols, quality metrics, and best practice recommendations. A written personalized care plan for preventive services as well as general preventive health recommendations were provided to patient.     Caryl Bis, LPN  07/08/8117   I have reviewed and agree with the above AWV documentation.   Rex Kras, MD [facility] 08/28/2016, 1:12 PM

## 2016-08-28 NOTE — Patient Instructions (Addendum)
Keep follow up appointment with MMM on 09/02/2016 Shingrix Vaccine to be done at Follow up Advance Directive Packet- Fill out and return copy to this office Seated Strengthening Exercises (Hand out given)    Ms. Gassen , Thank you for taking time to come for your Medicare Wellness Visit. I appreciate your ongoing commitment to your health goals. Please review the following plan we discussed and let me know if I can assist you in the future.   These are the goals we discussed: Goals    . Exercise 3x per week (30 min per time)          Increase exercise 3 times weekly at least 30 mins daily Seated Strengthening exercises     . Have 3 meals a day          Eat more Lean protein, fruits and vegetables        This is a list of the screening recommended for you and due dates:  Health Maintenance  Topic Date Due  . Stool Blood Test  12/16/2015  . Mammogram  12/12/2016*  . Flu Shot  10/30/2016  . Tetanus Vaccine  08/03/2023  . DEXA scan (bone density measurement)  Completed  .  Hepatitis C: One time screening is recommended by Center for Disease Control  (CDC) for  adults born from [birth year] through 1963.   Completed  . Pneumonia vaccines  Completed  *Topic was postponed. The date shown is not the original due date.    Advance Directive Advance directives are legal documents that let you make choices ahead of time about your health care and medical treatment in case you become unable to communicate for yourself. Advance directives are a way for you to communicate your wishes to family, friends, and health care providers. This can help convey your decisions about end-of-life care if you become unable to communicate. Discussing and writing advance directives should happen over time rather than all at once. Advance directives can be changed depending on your situation and what you want, even after you have signed the advance directives. If you do not have an advance directive, some states  assign family decision makers to act on your behalf based on how closely you are related to them. Each state has its own laws regarding advance directives. You may want to check with your health care provider, attorney, or state representative about the laws in your state. There are different types of advance directives, such as:  Medical power of attorney.  Living will.  Do not resuscitate (DNR) or do not attempt resuscitation (DNAR) order. Health care proxy and medical power of attorney A health care proxy, also called a health care agent, is a person who is appointed to make medical decisions for you in cases in which you are unable to make the decisions yourself. Generally, people choose someone they know well and trust to represent their preferences. Make sure to ask this person for an agreement to act as your proxy. A proxy may have to exercise judgment in the event of a medical decision for which your wishes are not known. A medical power of attorney is a legal document that names your health care proxy. Depending on the laws in your state, after the document is written, it may also need to be:  Signed.  Notarized.  Dated.  Copied.  Witnessed.  Incorporated into your medical record. You may also want to appoint someone to manage your financial affairs in a situation in which  you are unable to do so. This is called a durable power of attorney for finances. It is a separate legal document from the durable power of attorney for health care. You may choose the same person or someone different from your health care proxy to act as your agent in financial matters. If you do not appoint a proxy, or if there is a concern that the proxy is not acting in your best interests, a court-appointed guardian may be designated to act on your behalf. Living will A living will is a set of instructions documenting your wishes about medical care when you cannot express them yourself. Health care providers  should keep a copy of your living will in your medical record. You may want to give a copy to family members or friends. To alert caregivers in case of an emergency, you can place a card in your wallet to let them know that you have a living will and where they can find it. A living will is used if you become:  Terminally ill.  Incapacitated.  Unable to communicate or make decisions. Items to consider in your living will include:  The use or non-use of life-sustaining equipment, such as dialysis machines and breathing machines (ventilators).  A DNR or DNAR order, which is the instruction not to use cardiopulmonary resuscitation (CPR) if breathing or heartbeat stops.  The use or non-use of tube feeding.  Withholding of food and fluids.  Comfort (palliative) care when the goal becomes comfort rather than a cure.  Organ and tissue donation. A living will does not give instructions for distributing your money and property if you should pass away. It is recommended that you seek the advice of a lawyer when writing a will. Decisions about taxes, beneficiaries, and asset distribution will be legally binding. This process can relieve your family and friends of any concerns surrounding disputes or questions that may come up about the distribution of your assets. DNR or DNAR A DNR or DNAR order is a request not to have CPR in the event that your heart stops beating or you stop breathing. If a DNR or DNAR order has not been made and shared, a health care provider will try to help any patient whose heart has stopped or who has stopped breathing. If you plan to have surgery, talk with your health care provider about how your DNR or DNAR order will be followed if problems occur. Summary  Advance directives are the legal documents that allow you to make choices ahead of time about your health care and medical treatment in case you become unable to communicate for yourself.  The process of discussing and  writing advance directives should happen over time. You can change the advance directives, even after you have signed them.  Advance directives include DNR or DNAR orders, living wills, and designating an agent as your medical power of attorney. This information is not intended to replace advice given to you by your health care provider. Make sure you discuss any questions you have with your health care provider. Document Released: 06/25/2007 Document Revised: 02/05/2016 Document Reviewed: 02/05/2016 Elsevier Interactive Patient Education  2017 [employer].   Steps to Quit Smoking Smoking tobacco can be bad for your health. It can also affect almost every organ in your body. Smoking puts you and people around you at risk for many serious long-lasting (chronic) diseases. Quitting smoking is hard, but it is one of the best things that you can do for your  health. It is never too late to quit. What are the benefits of quitting smoking? When you quit smoking, you lower your risk for getting serious diseases and conditions. They can include:  Lung cancer or lung disease.  Heart disease.  Stroke.  Heart attack.  Not being able to have children (infertility).  Weak bones (osteoporosis) and broken bones (fractures). If you have coughing, wheezing, and shortness of breath, those symptoms may get better when you quit. You may also get sick less often. If you are pregnant, quitting smoking can help to lower your chances of having a baby of low birth weight. What can I do to help me quit smoking? Talk with your doctor about what can help you quit smoking. Some things you can do (strategies) include:  Quitting smoking totally, instead of slowly cutting back how much you smoke over a period of time.  Going to in-person counseling. You are more likely to quit if you go to many counseling sessions.  Using resources and support systems, such as:  Online chats with a Veterinary surgeoncounselor.  Phone  quitlines.  Printed Materials engineerself-help materials.  Support groups or group counseling.  Text messaging programs.  Mobile phone apps or applications.  Taking medicines. Some of these medicines may have nicotine in them. If you are pregnant or breastfeeding, do not take any medicines to quit smoking unless your doctor says it is okay. Talk with your doctor about counseling or other things that can help you. Talk with your doctor about using more than one strategy at the same time, such as taking medicines while you are also going to in-person counseling. This can help make quitting easier. What things can I do to make it easier to quit? Quitting smoking might feel very hard at first, but there is a lot that you can do to make it easier. Take these steps:  Talk to your family and friends. Ask them to support and encourage you.  Call phone quitlines, reach out to support groups, or work with a Veterinary surgeoncounselor.  Ask people who smoke to not smoke around you.  Avoid places that make you want (trigger) to smoke, such as:  Bars.  Parties.  Smoke-break areas at work.  Spend time with people who do not smoke.  Lower the stress in your life. Stress can make you want to smoke. Try these things to help your stress:  Getting regular exercise.  Deep-breathing exercises.  Yoga.  Meditating.  Doing a body scan. To do this, close your eyes, focus on one area of your body at a time from head to toe, and notice which parts of your body are tense. Try to relax the muscles in those areas.  Download or buy apps on your mobile phone or tablet that can help you stick to your quit plan. There are many free apps, such as QuitGuide from the [employer] developer(Centers for Disease Control and Prevention). You can find more support from smokefree.gov and other websites. This information is not intended to replace advice given to you by your health care provider. Make sure you discuss any questions you have with your health care  provider. Document Released: 01/12/2009 Document Revised: 11/14/2015 Document Reviewed: 08/02/2014 Elsevier Interactive Patient Education  2017 [employer]

## 2016-09-02 ENCOUNTER — Ambulatory Visit (INDEPENDENT_AMBULATORY_CARE_PROVIDER_SITE_OTHER): Payer: Medicare Other | Admitting: Nurse Practitioner

## 2016-09-02 ENCOUNTER — Encounter: Payer: Self-pay | Admitting: Nurse Practitioner

## 2016-09-02 VITALS — BP 144/76 | HR 61 | Temp 97.2°F | Ht 67.0 in | Wt 167.0 lb

## 2016-09-02 DIAGNOSIS — F332 Major depressive disorder, recurrent severe without psychotic features: Secondary | ICD-10-CM | POA: Diagnosis not present

## 2016-09-02 DIAGNOSIS — E785 Hyperlipidemia, unspecified: Secondary | ICD-10-CM | POA: Diagnosis not present

## 2016-09-02 DIAGNOSIS — I10 Essential (primary) hypertension: Secondary | ICD-10-CM

## 2016-09-02 DIAGNOSIS — M5137 Other intervertebral disc degeneration, lumbosacral region: Secondary | ICD-10-CM

## 2016-09-02 DIAGNOSIS — I739 Peripheral vascular disease, unspecified: Secondary | ICD-10-CM

## 2016-09-02 DIAGNOSIS — G8929 Other chronic pain: Secondary | ICD-10-CM | POA: Diagnosis not present

## 2016-09-02 DIAGNOSIS — M5442 Lumbago with sciatica, left side: Secondary | ICD-10-CM

## 2016-09-02 DIAGNOSIS — M5441 Lumbago with sciatica, right side: Secondary | ICD-10-CM | POA: Diagnosis not present

## 2016-09-02 DIAGNOSIS — F411 Generalized anxiety disorder: Secondary | ICD-10-CM

## 2016-09-02 DIAGNOSIS — F17200 Nicotine dependence, unspecified, uncomplicated: Secondary | ICD-10-CM

## 2016-09-02 DIAGNOSIS — K219 Gastro-esophageal reflux disease without esophagitis: Secondary | ICD-10-CM

## 2016-09-02 MED ORDER — HYDROCHLOROTHIAZIDE 25 MG PO TABS: 25.0000 mg | ORAL_TABLET | Freq: Every day | ORAL | 1 refills | Status: DC

## 2016-09-02 MED ORDER — MELOXICAM 15 MG PO TABS: 15.0000 mg | ORAL_TABLET | Freq: Every day | ORAL | 1 refills | Status: DC

## 2016-09-02 MED ORDER — TIZANIDINE HCL 4 MG PO TABS: 4.0000 mg | ORAL_TABLET | Freq: Three times a day (TID) | ORAL | 1 refills | Status: AC | PRN

## 2016-09-02 MED ORDER — GABAPENTIN 300 MG PO CAPS: 300.0000 mg | ORAL_CAPSULE | Freq: Three times a day (TID) | ORAL | 1 refills | Status: DC

## 2016-09-02 MED ORDER — ESCITALOPRAM OXALATE 20 MG PO TABS: 20.0000 mg | ORAL_TABLET | Freq: Every day | ORAL | 5 refills | Status: DC

## 2016-09-02 MED ORDER — LISINOPRIL 10 MG PO TABS: 10.0000 mg | ORAL_TABLET | Freq: Every day | ORAL | 1 refills | Status: DC

## 2016-09-02 NOTE — Patient Instructions (Signed)

## 2016-09-02 NOTE — Progress Notes (Signed)
Subjective:    Patient ID: [name], female    DOB: 1945-09-07, 71 y.o.   MRN: [id number]  HPI   [name] is here today for follow up of chronic medical problem.  Outpatient Encounter Prescriptions as of 09/02/2016  Medication Sig  . gabapentin (NEURONTIN) 300 MG capsule Take 1 capsule (300 mg total) by mouth 3 (three) times daily.  . hydrochlorothiazide (HYDRODIURIL) 25 MG tablet Take 1 tablet (25 mg total) by mouth daily.  [name] lisinopril (PRINIVIL,ZESTRIL) 10 MG tablet Take 1 tablet (10 mg total) by mouth daily.  . meloxicam (MOBIC) 15 MG tablet Take 1 tablet (15 mg total) by mouth daily.  . tapentadol (NUCYNTA) 50 MG 12 hr tablet Take 50 mg by mouth.  [name] tiZANidine (ZANAFLEX) 4 MG tablet Take 1-2 tablets (4-8 mg total) by mouth every 8 (eight) hours as needed for muscle spasms.   No facility-administered encounter medications on file as of 09/02/2016.     1. Peripheral vascular disease ([place])  Has had for several years- denies any problems  2. Essential hypertension  No chest pain,SOB or HA- does ot check blood pressures at home  3. Gastroesophageal reflux disease without esophagitis  Is currently not on nay medication- only flares up when she eats certain foods  4. DDD (degenerative disc disease), lumbosacral  chronic back pian- is currently going to pain management and is on nucynta- helps a lot with pain  5. TOBACCO ABUSE  Does not want o stop smoking  6. Hyperlipidemia with target LDL less than 100  Not good at watching fats in diet  7. GAD (generalized anxiety disorder)   Scored 14 on anxiety questionnaire  On 08/28/16 when was here for annual wellness exam. Says tat she just wants to sit and cry all the time. Her grandson died of an accidental overdose about 6 months ago and things have just not been good since. GAD 7 : Generalized Anxiety Score 08/28/2016  Nervous, Anxious, on Edge 2  Control/stop worrying 2  Worry too much - different things 2  Trouble relaxing  2  Restless 2  Easily annoyed or irritable 2  Afraid - awful might happen 2  Total GAD 7 Score 14  Anxiety Difficulty Somewhat difficult    Depression screen [facility] 2/9 09/02/2016 08/28/2016 06/07/2016 02/15/2016 12/12/2014  Decreased Interest 3 3 0 0 0  Down, Depressed, Hopeless 3 3 0 0 0  PHQ - 2 Score 6 6 0 0 0  Altered sleeping 3 3 - - -  Tired, decreased energy 2 2 - - -  Change in appetite 2 3 - - -  Feeling bad or failure about yourself  0 3 - - -  Trouble concentrating 3 2 - - -  Moving slowly or fidgety/restless 1 2 - - -  Suicidal thoughts 0 2 - - -  PHQ-9 Score 17 23 - - -  Difficult doing work/chores Very difficult Somewhat difficult - - -      New complaints: None     Review of Systems  Constitutional: Negative for diaphoresis.  HENT: Negative.   Eyes: Negative for pain.  Respiratory: Negative for shortness of breath.   Cardiovascular: Negative for chest pain, palpitations and leg swelling.  Gastrointestinal: Negative for abdominal pain.  Endocrine: Negative for polydipsia.  Genitourinary: Negative.   Musculoskeletal: Positive for back pain (chronic).  Skin: Negative for rash.  Neurological: Negative for dizziness, weakness and headaches.  Hematological: Does not bruise/bleed easily.  Psychiatric/Behavioral:  Negative.   All other systems reviewed and are negative.      Objective:   Physical Exam  Constitutional: She is oriented to person, place, and time. She appears well-developed and well-nourished.  HENT:  Nose: Nose normal.  Mouth/Throat: Oropharynx is clear and moist.  Eyes: EOM are normal.  Neck: Trachea normal, normal range of motion and full passive range of motion without pain. Neck supple. No JVD present. Carotid bruit is not present. No thyromegaly present.  Cardiovascular: Normal rate, regular rhythm, normal heart sounds and intact distal pulses.  Exam reveals no gallop and no friction rub.   No murmur heard. Pulmonary/Chest: Effort normal and  breath sounds normal.  Abdominal: Soft. Bowel sounds are normal. She exhibits no distension and no mass. There is no tenderness.  Musculoskeletal: Normal range of motion.  Pain in lower back with full extension and flexion (-) SLR bbil Motor strength and sensation distally intact  Lymphadenopathy:    She has no cervical adenopathy.  Neurological: She is alert and oriented to person, place, and time. She has normal reflexes.  Skin: Skin is warm and dry.  Psychiatric: Her behavior is normal. Judgment and thought content normal.  tearful during exam   BP (!) 144/76   Pulse 61   Temp 97.2 F (36.2 C) (Oral)   Ht _0  (1.702 m)   Wt 167 lb (75.8 kg)   BMI 26.16 kg/m       Assessment & Plan:  1. Peripheral vascular disease ([place])  2. Essential hypertension Low sodium diet - hydrochlorothiazide (HYDRODIURIL) 25 MG tablet; Take 1 tablet (25 mg total) by mouth daily.  Dispense: 90 tablet; Refill: 1 - lisinopril (PRINIVIL,ZESTRIL) 10 MG tablet; Take 1 tablet (10 mg total) by mouth daily.  Dispense: 90 tablet; Refill: 1 - CMP14+EGFR  3. Gastroesophageal reflux disease without esophagitis Avoid spicy foods Do not eat 2 hours prior to bedtime  4. DDD (degenerative disc disease), lumbosacral Continue at pain clinic  5. TOBACCO ABUSE Smoking cessation encouraged  6. Hyperlipidemia with target LDL less than 100 Low fta diet - Lipid panel  7. GAD (generalized anxiety disorder) Stress management Started on lexapro- hope it will help with anxiety and depresion  8. Chronic bilateral low back pain with bilateral sciatica Continue at pain clinic - tiZANidine (ZANAFLEX) 4 MG tablet; Take 1-2 tablets (4-8 mg total) by mouth every 8 (eight) hours as needed for muscle spasms.  Dispense: 90 tablet; Refill: 1 - meloxicam (MOBIC) 15 MG tablet; Take 1 tablet (15 mg total) by mouth daily.  Dispense: 90 tablet; Refill: 1 - gabapentin (NEURONTIN) 300 MG capsule; Take 1 capsule (300 mg total) by  mouth 3 (three) times daily.  Dispense: 270 capsule; Refill: 1  9. Severe episode of recurrent major depressive disorder, without psychotic features ([place]) Encouraged grief counseling - escitalopram (LEXAPRO) 20 MG tablet; Take 1 tablet (20 mg total) by mouth daily.  Dispense: 30 tablet; Refill: 5    Labs pending Health maintenance reviewed Diet and exercise encouraged Continue all meds Follow up  In 3 months   [place], [name]

## 2016-09-03 LAB — CMP14+EGFR
ALBUMIN: 4.4 g/dL (ref 3.6–4.8)
ALK PHOS: 111 IU/L (ref 39–117)
ALT: 17 IU/L (ref 0–32)
AST: 24 IU/L (ref 0–40)
Albumin/Globulin Ratio: 1.6 (ref 1.2–2.2)
BUN / CREAT RATIO: 14 (ref 12–28)
BUN: 15 mg/dL (ref 8–27)
Bilirubin Total: 0.2 mg/dL (ref 0.0–1.2)
CALCIUM: 10 mg/dL (ref 8.7–10.3)
CO2: 26 mmol/L (ref 18–29)
Chloride: 99 mmol/L (ref 96–106)
Creatinine, Ser: 1.04 mg/dL — ABNORMAL HIGH (ref 0.57–1.00)
GFR calc non Af Amer: 55 mL/min/{1.73_m2} — ABNORMAL LOW (ref >59)
GFR, EST AFRICAN AMERICAN: 63 mL/min/{1.73_m2} (ref >59)
GLOBULIN, TOTAL: 2.7 g/dL (ref 1.5–4.5)
GLUCOSE: 97 mg/dL (ref 65–99)
Potassium: 3.7 mmol/L (ref 3.5–5.2)
SODIUM: 141 mmol/L (ref 134–144)
Total Protein: 7.1 g/dL (ref 6.0–8.5)

## 2016-09-03 LAB — LIPID PANEL
CHOL/HDL RATIO: 7.4 ratio — AB (ref 0.0–4.4)
CHOLESTEROL TOTAL: 243 mg/dL — AB (ref 100–199)
HDL: 33 mg/dL — ABNORMAL LOW (ref >39)
LDL CALC: 132 mg/dL — AB (ref 0–99)
Triglycerides: 388 mg/dL — ABNORMAL HIGH (ref 0–149)
VLDL CHOLESTEROL CAL: 78 mg/dL — AB (ref 5–40)

## 2016-09-05 ENCOUNTER — Other Ambulatory Visit: Payer: Self-pay | Admitting: Nurse Practitioner

## 2016-09-05 MED ORDER — ATORVASTATIN CALCIUM 40 MG PO TABS: 40.0000 mg | ORAL_TABLET | Freq: Every day | ORAL | 5 refills | Status: DC

## 2016-09-05 MED ORDER — FENOFIBRATE 160 MG PO TABS: 160.0000 mg | ORAL_TABLET | Freq: Every day | ORAL | 3 refills | Status: DC

## 2016-10-09 ENCOUNTER — Encounter: Payer: Self-pay | Admitting: Nurse Practitioner

## 2017-01-30 ENCOUNTER — Other Ambulatory Visit: Payer: Self-pay | Admitting: *Deleted

## 2017-01-30 MED ORDER — ATORVASTATIN CALCIUM 40 MG PO TABS: 40.0000 mg | ORAL_TABLET | Freq: Every day | ORAL | 0 refills | Status: DC

## 2017-01-31 ENCOUNTER — Other Ambulatory Visit: Payer: Self-pay | Admitting: Nurse Practitioner

## 2017-03-03 ENCOUNTER — Other Ambulatory Visit: Payer: Self-pay | Admitting: Nurse Practitioner

## 2017-03-03 DIAGNOSIS — F332 Major depressive disorder, recurrent severe without psychotic features: Secondary | ICD-10-CM

## 2017-03-20 ENCOUNTER — Other Ambulatory Visit: Payer: Self-pay | Admitting: Nurse Practitioner

## 2017-03-20 DIAGNOSIS — I10 Essential (primary) hypertension: Secondary | ICD-10-CM

## 2017-04-02 ENCOUNTER — Other Ambulatory Visit: Payer: Self-pay | Admitting: Nurse Practitioner

## 2017-05-02 ENCOUNTER — Other Ambulatory Visit: Payer: Self-pay | Admitting: Family Medicine

## 2017-06-02 ENCOUNTER — Other Ambulatory Visit: Payer: Self-pay | Admitting: Nurse Practitioner

## 2017-06-03 NOTE — Telephone Encounter (Signed)
Last refill without being seen 

## 2017-06-03 NOTE — Telephone Encounter (Signed)
Last seen 09/02/16

## 2017-06-20 ENCOUNTER — Other Ambulatory Visit: Payer: Self-pay | Admitting: Nurse Practitioner

## 2017-06-20 DIAGNOSIS — I10 Essential (primary) hypertension: Secondary | ICD-10-CM

## 2017-07-02 ENCOUNTER — Other Ambulatory Visit: Payer: Self-pay | Admitting: Nurse Practitioner

## 2017-07-03 NOTE — Telephone Encounter (Signed)
Last refill without being seen 

## 2017-08-05 ENCOUNTER — Other Ambulatory Visit: Payer: Self-pay | Admitting: Nurse Practitioner

## 2017-09-22 ENCOUNTER — Other Ambulatory Visit: Payer: Self-pay | Admitting: Nurse Practitioner

## 2017-09-22 DIAGNOSIS — I10 Essential (primary) hypertension: Secondary | ICD-10-CM

## 2017-09-24 ENCOUNTER — Telehealth: Payer: Self-pay | Admitting: *Deleted

## 2017-09-24 DIAGNOSIS — I10 Essential (primary) hypertension: Secondary | ICD-10-CM

## 2017-09-24 MED ORDER — LISINOPRIL 10 MG PO TABS: ORAL_TABLET | ORAL | 0 refills | Status: DC

## 2017-09-24 MED ORDER — HYDROCHLOROTHIAZIDE 25 MG PO TABS: 25.0000 mg | ORAL_TABLET | Freq: Every day | ORAL | 0 refills | Status: DC

## 2017-09-24 MED ORDER — FENOFIBRATE 160 MG PO TABS: ORAL_TABLET | ORAL | 0 refills | Status: DC

## 2017-09-24 NOTE — Telephone Encounter (Signed)
Pt aware refill sent to pharmacy 

## 2017-10-23 ENCOUNTER — Encounter: Payer: Self-pay | Admitting: Nurse Practitioner

## 2017-10-23 ENCOUNTER — Ambulatory Visit (INDEPENDENT_AMBULATORY_CARE_PROVIDER_SITE_OTHER): Payer: Medicare Other | Admitting: Nurse Practitioner

## 2017-10-23 VITALS — BP 135/70 | HR 64 | Temp 97.0°F | Ht 67.0 in | Wt 178.0 lb

## 2017-10-23 DIAGNOSIS — E785 Hyperlipidemia, unspecified: Secondary | ICD-10-CM

## 2017-10-23 DIAGNOSIS — M5441 Lumbago with sciatica, right side: Secondary | ICD-10-CM

## 2017-10-23 DIAGNOSIS — M5442 Lumbago with sciatica, left side: Secondary | ICD-10-CM

## 2017-10-23 DIAGNOSIS — F411 Generalized anxiety disorder: Secondary | ICD-10-CM

## 2017-10-23 DIAGNOSIS — K219 Gastro-esophageal reflux disease without esophagitis: Secondary | ICD-10-CM

## 2017-10-23 DIAGNOSIS — I10 Essential (primary) hypertension: Secondary | ICD-10-CM

## 2017-10-23 DIAGNOSIS — F331 Major depressive disorder, recurrent, moderate: Secondary | ICD-10-CM

## 2017-10-23 DIAGNOSIS — F17200 Nicotine dependence, unspecified, uncomplicated: Secondary | ICD-10-CM

## 2017-10-23 DIAGNOSIS — I739 Peripheral vascular disease, unspecified: Secondary | ICD-10-CM | POA: Diagnosis not present

## 2017-10-23 DIAGNOSIS — G8929 Other chronic pain: Secondary | ICD-10-CM

## 2017-10-23 DIAGNOSIS — M5137 Other intervertebral disc degeneration, lumbosacral region: Secondary | ICD-10-CM

## 2017-10-23 MED ORDER — HYDROCHLOROTHIAZIDE 25 MG PO TABS: 25.0000 mg | ORAL_TABLET | Freq: Every day | ORAL | 0 refills | Status: DC

## 2017-10-23 MED ORDER — FENOFIBRATE 160 MG PO TABS: ORAL_TABLET | ORAL | 0 refills | Status: DC

## 2017-10-23 MED ORDER — LISINOPRIL 10 MG PO TABS: ORAL_TABLET | ORAL | 0 refills | Status: DC

## 2017-10-23 MED ORDER — ATORVASTATIN CALCIUM 40 MG PO TABS: 40.0000 mg | ORAL_TABLET | Freq: Every day | ORAL | 5 refills | Status: DC

## 2017-10-23 MED ORDER — LYRICA 100 MG PO CAPS: 100.0000 mg | ORAL_CAPSULE | Freq: Three times a day (TID) | ORAL | 5 refills | Status: DC

## 2017-10-23 MED ORDER — DULOXETINE HCL 60 MG PO CPEP: 60.0000 mg | ORAL_CAPSULE | Freq: Every day | ORAL | 5 refills | Status: DC

## 2017-10-23 NOTE — Patient Instructions (Signed)

## 2017-10-23 NOTE — Progress Notes (Signed)
Subjective:    Patient ID: [name], female    DOB: [birth year], 72 y.o.   MRN: [id number]  Chief Complaint: Medical Management of Chronic Issues   HPI:  1. Peripheral vascular disease (HCC)  She says that toes get cold a lot, especially when she is sitting for long periods of time.  2. Essential hypertension  No c/o chest pain, sob or headaches. Does not check blood pressure at home. BP Readings from Last 3 Encounters:  10/23/17 135/70  09/02/16 (!) 144/76  08/28/16 113/66     3. Gastroesophageal reflux disease without esophagitis  She is just using rolaids OTC and that stops her symptoms  4. TOBACCO ABUSE  Smokes about 1 pack a day. Has occasional cough. Refuses chest xray  5. Hyperlipidemia with target LDL less than 100  Does not watch diet and does very little exercise  6. GAD (generalized anxiety disorder)  Stays stressed and takes lexapro helps keep her from worrying so much.  7. Moderate episode of recurrent major depressive disorder (HCC)  lexapro working well for depression. Depression screen [facility] 2/9 10/23/2017 09/02/2016 08/28/2016  Decreased Interest 3 3 3   Down, Depressed, Hopeless 3 3 3   PHQ - 2 Score 6 6 6   Altered sleeping 2 3 3   Tired, decreased energy 2 2 2   Change in appetite 3 2 3   Feeling bad or failure about yourself  2 0 3  Trouble concentrating 2 3 2   Moving slowly or fidgety/restless 2 1 2   Suicidal thoughts 0 0 2  PHQ-9 Score 19 17 23   Difficult doing work/chores - Very difficult Somewhat difficult     8. DDD (degenerative disc disease), lumbosacral  Is on lyric which helps. Had back surgery in past and has nerve stimulator in place  9. Chronic bilateral low back pain with bilateral sciatica  Nerve stimulator helps. Rates pain 7-8/10.    Outpatient Encounter Medications as of 10/23/2017  Medication Sig  . atorvastatin (LIPITOR) 40 MG tablet TAKE 1 TABLET BY MOUTH ONCE DAILY  . DULoxetine (CYMBALTA) 60 MG capsule Take 60 mg by mouth  daily.  Marland Kitchen escitalopram (LEXAPRO) 20 MG tablet   . fenofibrate 160 MG tablet TAKE 1 TABLET BY MOUTH ONCE DAILY  . hydrochlorothiazide (HYDRODIURIL) 25 MG tablet Take 1 tablet (25 mg total) by mouth daily.  Marland Kitchen lisinopril (PRINIVIL,ZESTRIL) 10 MG tablet TAKE 1 TABLET BY MOUTH ONCE DAILY (NEEDS  TO  BE  SEEN)  . LYRICA 100 MG capsule Take 100 mg by mouth 3 (three) times daily.  . meloxicam (MOBIC) 15 MG tablet Take 1 tablet (15 mg total) by mouth daily.  Marland Kitchen tiZANidine (ZANAFLEX) 4 MG tablet Take 1-2 tablets (4-8 mg total) by mouth every 8 (eight) hours as needed for muscle spasms.     New complaints: None today  Social history: Lives by herself. Keeps her house up.    Review of Systems  Constitutional: Negative for activity change and appetite change.  HENT: Negative.   Eyes: Negative for pain.  Respiratory: Negative for shortness of breath.   Cardiovascular: Negative for chest pain, palpitations and leg swelling.  Gastrointestinal: Negative for abdominal pain.  Endocrine: Negative for polydipsia.  Genitourinary: Negative.   Skin: Negative for rash.  Neurological: Negative for dizziness, weakness and headaches.  Hematological: Does not bruise/bleed easily.  Psychiatric/Behavioral: Negative.   All other systems reviewed and are negative.      Objective:   Physical Exam  Constitutional: She is oriented to person,  place, and time. She appears well-developed and well-nourished.  HENT:  Head: Normocephalic.  Nose: Nose normal.  Mouth/Throat: Oropharynx is clear and moist.  Eyes: Pupils are equal, round, and reactive to light. EOM are normal.  Neck: Normal range of motion. Neck supple. No JVD present. Carotid bruit is not present.  Cardiovascular: Normal rate, regular rhythm, normal heart sounds and intact distal pulses.  Pulmonary/Chest: Effort normal and breath sounds normal. No respiratory distress. She has no wheezes. She has no rales. She exhibits no tenderness.  Abdominal:  Soft. Normal appearance, normal aorta and bowel sounds are normal. She exhibits no distension, no abdominal bruit, no pulsatile midline mass and no mass. There is no splenomegaly or hepatomegaly. There is no tenderness.  Musculoskeletal: Normal range of motion. She exhibits no edema.  Decrease ROM of lumbar spine with pain on flexion and extension (-) SLR bil  Lymphadenopathy:    She has no cervical adenopathy.  Neurological: She is alert and oriented to person, place, and time. She has normal reflexes.  Skin: Skin is warm and dry.  Psychiatric: She has a normal mood and affect. Her behavior is normal. Judgment and thought content normal.  Nursing note and vitals reviewed.   BP 135/70   Pulse 64   Temp (!) 97 F (36.1 C) (Oral)   Ht 5\' 7"  (1.702 m)   Wt 178 lb (80.7 kg)   BMI 27.88 kg/m        Assessment & Plan:  Danielle FilbertLinda S Lakins comes in today with chief complaint of Medical Management of Chronic Issues   Diagnosis and orders addressed:  1. Essential hypertension Low sodium diet - hydrochlorothiazide (HYDRODIURIL) 25 MG tablet; Take 1 tablet (25 mg total) by mouth daily.  Dispense: 30 tablet; Refill: 0 - lisinopril (PRINIVIL,ZESTRIL) 10 MG tablet; TAKE 1 TABLET BY MOUTH ONCE DAILY (NEEDS  TO  BE  SEEN)  Dispense: 30 tablet; Refill: 0  2. Gastroesophageal reflux disease without esophagitis Avoid spicy foods Do not eat 2 hours prior to bedtime  3. Peripheral vascular disease (HCC) Wear compression soaks during day  4. TOBACCO ABUSE Encouraged smoking cssation  5. Hyperlipidemia with target LDL less than 100 Low fat diet - fenofibrate 160 MG tablet; TAKE 1 TABLET BY MOUTH ONCE DAILY  Dispense: 30 tablet; Refill: 0 - atorvastatin (LIPITOR) 40 MG tablet; Take 1 tablet (40 mg total) by mouth daily.  Dispense: 30 tablet; Refill: 5  6. GAD (generalized anxiety disorder) Stress management  7. Moderate episode of recurrent major depressive disorder (HCC)  8. DDD  (degenerative disc disease), lumbosacral  9. Chronic bilateral low back pain with bilateral sciatica Moist heat to back No bending or stooping or heavy lifting - DULoxetine (CYMBALTA) 60 MG capsule; Take 1 capsule (60 mg total) by mouth daily.  Dispense: 30 capsule; Refill: 5 - LYRICA 100 MG capsule; Take 1 capsule (100 mg total) by mouth 3 (three) times daily.  Dispense: 30 capsule; Refill: 5   Labs pending Health Maintenance reviewed Diet and exercise encouraged  Follow up plan: 6 months   Mary-Margaret Daphine DeutscherMartin, [name]

## 2017-10-23 NOTE — Addendum Note (Signed)
Addended by: Bennie PieriniMARTIN, MARY-MARGARET on: 10/23/2017 12:50 PM   Modules accepted: Orders

## 2017-10-24 LAB — LIPID PANEL
CHOL/HDL RATIO: 3.6 ratio (ref 0.0–4.4)
Cholesterol, Total: 108 mg/dL (ref 100–199)
HDL: 30 mg/dL — AB (ref >39)
LDL Calculated: 36 mg/dL (ref 0–99)
Triglycerides: 212 mg/dL — ABNORMAL HIGH (ref 0–149)
VLDL CHOLESTEROL CAL: 42 mg/dL — AB (ref 5–40)

## 2017-10-24 LAB — CMP14+EGFR
ALBUMIN: 3.8 g/dL (ref 3.5–4.8)
ALT: 11 IU/L (ref 0–32)
AST: 19 IU/L (ref 0–40)
Albumin/Globulin Ratio: 1.4 (ref 1.2–2.2)
Alkaline Phosphatase: 81 IU/L (ref 39–117)
BUN / CREAT RATIO: 11 — AB (ref 12–28)
BUN: 11 mg/dL (ref 8–27)
Bilirubin Total: 0.3 mg/dL (ref 0.0–1.2)
CALCIUM: 9.3 mg/dL (ref 8.7–10.3)
CO2: 23 mmol/L (ref 20–29)
Chloride: 110 mmol/L — ABNORMAL HIGH (ref 96–106)
Creatinine, Ser: 1.03 mg/dL — ABNORMAL HIGH (ref 0.57–1.00)
GFR, EST AFRICAN AMERICAN: 64 mL/min/{1.73_m2} (ref >59)
GFR, EST NON AFRICAN AMERICAN: 55 mL/min/{1.73_m2} — AB (ref >59)
GLUCOSE: 99 mg/dL (ref 65–99)
Globulin, Total: 2.7 g/dL (ref 1.5–4.5)
Potassium: 3.6 mmol/L (ref 3.5–5.2)
Sodium: 142 mmol/L (ref 134–144)
TOTAL PROTEIN: 6.5 g/dL (ref 6.0–8.5)

## 2017-11-13 ENCOUNTER — Encounter: Payer: Self-pay | Admitting: Family

## 2017-11-13 ENCOUNTER — Ambulatory Visit (INDEPENDENT_AMBULATORY_CARE_PROVIDER_SITE_OTHER): Payer: Medicare Other | Admitting: Family

## 2017-11-13 VITALS — Temp 97.0°F | Ht 67.0 in | Wt 164.6 lb

## 2017-11-13 DIAGNOSIS — R296 Repeated falls: Secondary | ICD-10-CM | POA: Diagnosis not present

## 2017-11-13 DIAGNOSIS — I959 Hypotension, unspecified: Secondary | ICD-10-CM | POA: Diagnosis not present

## 2017-11-13 DIAGNOSIS — R5383 Other fatigue: Secondary | ICD-10-CM | POA: Diagnosis not present

## 2017-11-13 DIAGNOSIS — R55 Syncope and collapse: Secondary | ICD-10-CM

## 2017-11-13 LAB — CBC WITH DIFFERENTIAL/PLATELET
BASOS ABS: 0 10*3/uL (ref 0.0–0.2)
Basos: 0 %
EOS (ABSOLUTE): 0.2 10*3/uL (ref 0.0–0.4)
Eos: 2 %
Hematocrit: 40.7 % (ref 34.0–46.6)
Hemoglobin: 13.2 g/dL (ref 11.1–15.9)
IMMATURE GRANS (ABS): 0 10*3/uL (ref 0.0–0.1)
IMMATURE GRANULOCYTES: 0 %
LYMPHS: 17 %
Lymphocytes Absolute: 2 10*3/uL (ref 0.7–3.1)
MCH: 29.5 pg (ref 26.6–33.0)
MCHC: 32.4 g/dL (ref 31.5–35.7)
MCV: 91 fL (ref 79–97)
MONOCYTES: 10 %
Monocytes Absolute: 1.1 10*3/uL — ABNORMAL HIGH (ref 0.1–0.9)
NEUTROS ABS: 8 10*3/uL — AB (ref 1.4–7.0)
NEUTROS PCT: 71 %
PLATELETS: 312 10*3/uL (ref 150–450)
RBC: 4.47 x10E6/uL (ref 3.77–5.28)
RDW: 14.7 % (ref 12.3–15.4)
WBC: 11.3 10*3/uL — AB (ref 3.4–10.8)

## 2017-11-13 LAB — BMP8+EGFR
BUN / CREAT RATIO: 20 (ref 12–28)
BUN: 40 mg/dL — ABNORMAL HIGH (ref 8–27)
CO2: 22 mmol/L (ref 20–29)
CREATININE: 2.02 mg/dL — AB (ref 0.57–1.00)
Calcium: 9.6 mg/dL (ref 8.7–10.3)
Chloride: 93 mmol/L — ABNORMAL LOW (ref 96–106)
GFR calc non Af Amer: 24 mL/min/{1.73_m2} — ABNORMAL LOW (ref >59)
GFR, EST AFRICAN AMERICAN: 28 mL/min/{1.73_m2} — AB (ref >59)
GLUCOSE: 132 mg/dL — AB (ref 65–99)
Potassium: 3.7 mmol/L (ref 3.5–5.2)
SODIUM: 134 mmol/L (ref 134–144)

## 2017-11-13 NOTE — Patient Instructions (Signed)
Orthostatic Hypotension Orthostatic hypotension is a sudden drop in blood pressure that happens when you quickly change positions, such as when you get up from a seated or lying position. Blood pressure is a measurement of how strongly, or weakly, your blood is pressing against the walls of your arteries. Arteries are blood vessels that carry blood from your heart throughout your body. When blood pressure is too low, you may not get enough blood to your brain or to the rest of your organs. This can cause weakness, light-headedness, rapid heartbeat, and fainting. This can last for just a few seconds or for up to a few minutes. Orthostatic hypotension is usually not a serious problem. However, if it happens frequently or gets worse, it may be a sign of something more serious. What are the causes? This condition may be caused by:  Sudden changes in posture, such as standing up quickly after you have been sitting or lying down.  Blood loss.  Loss of body fluids (dehydration).  Heart problems.  Hormone (endocrine) problems.  Pregnancy.  Severe infection.  Lack of certain nutrients.  Severe allergic reactions (anaphylaxis).  Certain medicines, such as blood pressure medicine or medicines that make the body lose excess fluids (diuretics). Sometimes, this condition can be caused by not taking medicine as directed, such as taking too much of a certain medicine.  What increases the risk? Certain factors can make you more likely to develop orthostatic hypotension, including:  Age. Risk increases as you get older.  Conditions that affect the heart or the central nervous system.  Taking certain medicines, such as blood pressure medicine or diuretics.  Being pregnant.  What are the signs or symptoms? Symptoms of this condition may include:  Weakness.  Light-headedness.  Dizziness.  Blurred vision.  Fatigue.  Rapid heartbeat.  Fainting, in severe cases.  How is this  diagnosed? This condition is diagnosed based on:  Your medical history.  Your symptoms.  Your blood pressure measurement. Your health care provider will check your blood pressure when you are: ? Lying down. ? Sitting. ? Standing.  A blood pressure reading is recorded as two numbers, such as "120 over 80" (or 120/80). The first ("top") number is called the systolic pressure. It is a measure of the pressure in your arteries as your heart beats. The second ("bottom") number is called the diastolic pressure. It is a measure of the pressure in your arteries when your heart relaxes between beats. Blood pressure is measured in a unit called mm Hg. Healthy blood pressure for adults is 120/80. If your blood pressure is below 90/60, you may be diagnosed with hypotension. Other information or tests that may be used to diagnose orthostatic hypotension include:  Your other vital signs, such as your heart rate and temperature.  Blood tests.  Tilt table test. For this test, you will be safely secured to a table that moves you from a lying position to an upright position. Your heart rhythm and blood pressure will be monitored during the test.  How is this treated? Treatment for this condition may include:  Changing your diet. This may involve eating more salt (sodium) or drinking more water.  Taking medicines to raise your blood pressure.  Changing the dosage of certain medicines you are taking that might be lowering your blood pressure.  Wearing compression stockings. These stockings help to prevent blood clots and reduce swelling in your legs.  In some cases, you may need to go to the hospital for:    Fluid replacement. This means you will receive fluids through an IV tube.  Blood replacement. This means you will receive donated blood through an IV tube (transfusion).  Treating an infection or heart problems, if this applies.  Monitoring. You may need to be monitored while medicines that you  are taking wear off.  Follow these instructions at home: Eating and drinking   Drink enough fluid to keep your urine clear or pale yellow.  Eat a healthy diet and follow instructions from your health care provider about eating or drinking restrictions. A healthy diet includes: ? Fresh fruits and vegetables. ? Whole grains. ? Lean meats. ? Low-fat dairy products.  Eat extra salt only as directed. Do not add extra salt to your diet unless your health care provider told you to do that.  Eat frequent, small meals.  Avoid standing up suddenly after eating. Medicines  Take over-the-counter and prescription medicines only as told by your health care provider. ? Follow instructions from your health care provider about changing the dosage of your current medicines, if this applies. ? Do not stop or adjust any of your medicines on your own. General instructions  Wear compression stockings as told by your health care provider.  Get up slowly from lying down or sitting positions. This gives your blood pressure a chance to adjust.  Avoid hot showers and excessive heat as directed by your health care provider.  Return to your normal activities as told by your health care provider. Ask your health care provider what activities are safe for you.  Do not use any products that contain nicotine or tobacco, such as cigarettes and e-cigarettes. If you need help quitting, ask your health care provider.  Keep all follow-up visits as told by your health care provider. This is important. Contact a health care provider if:  You vomit.  You have diarrhea.  You have a fever for more than 2-3 days.  You feel more thirsty than usual.  You feel weak and tired. Get help right away if:  You have chest pain.  You have a fast or irregular heartbeat.  You develop numbness in any part of your body.  You cannot move your arms or your legs.  You have trouble speaking.  You become sweaty or feel  lightheaded.  You faint.  You feel short of breath.  You have trouble staying awake.  You feel confused. This information is not intended to replace advice given to you by your health care provider. Make sure you discuss any questions you have with your health care provider. Document Released: 03/08/2002 Document Revised: 12/05/2015 Document Reviewed: 09/08/2015 Elsevier Interactive Patient Education  2018 [employer] Inc[employer]  

## 2017-11-13 NOTE — Progress Notes (Signed)
   Subjective:    Patient ID: [name], female    DOB: [birth year], 72 y.o.   MRN: [id number]  Chief Complaint  Patient presents with  . palpatations    HPI PT presents to the office today with falling. States over the last two weeks she has fallen 5-6 times. She states when she stands up and starts walking she feels "funny and just falls". She said she had a friend listen to her heart and thought it "might be skipping beats".   She states she was started on Lyrica in the last month from her pain clinic. Her BP is low today. She reports she takes her Lisinopril and HCTZ at night.  Denies any dizziness, but having fatigue.    Review of Systems  Constitutional: Positive for fatigue.  Neurological: Positive for syncope.  All other systems reviewed and are negative.      Objective:   Physical Exam  Constitutional: She is oriented to person, place, and time. She appears well-developed and well-nourished. No distress.  HENT:  Head: Normocephalic and atraumatic.  Eyes: Pupils are equal, round, and reactive to light.  Neck: Normal range of motion. Neck supple. No thyromegaly present.  Cardiovascular: Normal rate, regular rhythm, normal heart sounds and intact distal pulses.  No murmur heard. Pulmonary/Chest: Effort normal and breath sounds normal. No respiratory distress. She has no wheezes.  Abdominal: Soft. Bowel sounds are normal. She exhibits no distension. There is no tenderness.  Musculoskeletal: Normal range of motion. She exhibits no edema or tenderness.  Neurological: She is alert and oriented to person, place, and time. She has normal reflexes. No cranial nerve deficit.  Skin: Skin is warm and dry.  Psychiatric: She has a normal mood and affect. Her behavior is normal. Judgment and thought content normal.  Vitals reviewed.     BP (!) 91/55   Pulse 80   Temp (!) 97 F (36.1 C) (Oral)   Ht _0  (1.702 m)   Wt 164 lb 9.6 oz (74.7 kg)   BMI 25.78 kg/m        Assessment & Plan:  [name] comes in today with chief complaint of palpatations   Diagnosis and orders addressed:  1. Fatigue, unspecified type - EKG 12-Lead - BMP8+EGFR - CBC with Differential/Platelet  2. Hypotension, unspecified hypotension type Will hold lisinopril and HCTZ today Force fluids - BMP8+EGFR - CBC with Differential/Platelet  3. Frequent falls Fall preventions discussed - BMP8+EGFR - CBC with Differential/Platelet  4. Syncope, unspecified syncope type - BMP8+EGFR - CBC with Differential/Platelet  Labs pending Hold Lisinopril and HCTZ Discussed importance of staying hydrated   Follow up plan: 1 week with PCP to check BP   Evelina Dun, [name]

## 2017-11-14 ENCOUNTER — Ambulatory Visit (INDEPENDENT_AMBULATORY_CARE_PROVIDER_SITE_OTHER): Payer: Medicare Other | Admitting: Family

## 2017-11-14 ENCOUNTER — Encounter: Payer: Self-pay | Admitting: Family

## 2017-11-14 VITALS — BP 125/81 | HR 88 | Temp 98.4°F | Ht 67.0 in | Wt 165.2 lb

## 2017-11-14 DIAGNOSIS — I10 Essential (primary) hypertension: Secondary | ICD-10-CM

## 2017-11-14 DIAGNOSIS — I959 Hypotension, unspecified: Secondary | ICD-10-CM | POA: Diagnosis not present

## 2017-11-14 DIAGNOSIS — R7989 Other specified abnormal findings of blood chemistry: Secondary | ICD-10-CM

## 2017-11-14 DIAGNOSIS — E860 Dehydration: Secondary | ICD-10-CM

## 2017-11-14 NOTE — Patient Instructions (Signed)
Dehydration, Adult Dehydration is a condition in which there is not enough fluid or water in the body. This happens when you lose more fluids than you take in. Important organs, such as the kidneys, brain, and heart, cannot function without a proper amount of fluids. Any loss of fluids from the body can lead to dehydration. Dehydration can range from mild to severe. This condition should be treated right away to prevent it from becoming severe. What are the causes? This condition may be caused by:  Vomiting.  Diarrhea.  Excessive sweating, such as from heat exposure or exercise.  Not drinking enough fluid, especially: ? When ill. ? While doing activity that requires a lot of energy.  Excessive urination.  Fever.  Infection.  Certain medicines, such as medicines that cause the body to lose excess fluid (diuretics).  Inability to access safe drinking water.  Reduced physical ability to get adequate water and food.  What increases the risk? This condition is more likely to develop in people:  Who have a poorly controlled long-term (chronic) illness, such as diabetes, heart disease, or kidney disease.  Who are age 65 or older.  Who are disabled.  Who live in a place with high altitude.  Who play endurance sports.  What are the signs or symptoms? Symptoms of mild dehydration may include:  Thirst.  Dry lips.  Slightly dry mouth.  Dry, warm skin.  Dizziness. Symptoms of moderate dehydration may include:  Very dry mouth.  Muscle cramps.  Dark urine. Urine may be the color of tea.  Decreased urine production.  Decreased tear production.  Heartbeat that is irregular or faster than normal (palpitations).  Headache.  Light-headedness, especially when you stand up from a sitting position.  Fainting (syncope). Symptoms of severe dehydration may include:  Changes in skin, such as: ? Cold and clammy skin. ? Blotchy (mottled) or pale skin. ? Skin that does  not quickly return to normal after being lightly pinched and released (poor skin turgor).  Changes in body fluids, such as: ? Extreme thirst. ? No tear production. ? Inability to sweat when body temperature is high, such as in hot weather. ? Very little urine production.  Changes in vital signs, such as: ? Weak pulse. ? Pulse that is more than 100 beats a minute when sitting still. ? Rapid breathing. ? Low blood pressure.  Other changes, such as: ? Sunken eyes. ? Cold hands and feet. ? Confusion. ? Lack of energy (lethargy). ? Difficulty waking up from sleep. ? Short-term weight loss. ? Unconsciousness. How is this diagnosed? This condition is diagnosed based on your symptoms and a physical exam. Blood and urine tests may be done to help confirm the diagnosis. How is this treated? Treatment for this condition depends on the severity. Mild or moderate dehydration can often be treated at home. Treatment should be started right away. Do not wait until dehydration becomes severe. Severe dehydration is an emergency and it needs to be treated in a hospital. Treatment for mild dehydration may include:  Drinking more fluids.  Replacing salts and minerals in your blood (electrolytes) that you may have lost. Treatment for moderate dehydration may include:  Drinking an oral rehydration solution (ORS). This is a drink that helps you replace fluids and electrolytes (rehydrate). It can be found at pharmacies and retail stores. Treatment for severe dehydration may include:  Receiving fluids through an IV tube.  Receiving an electrolyte solution through a feeding tube that is passed through your nose   and into your stomach (nasogastric tube, or NG tube).  Correcting any abnormalities in electrolytes.  Treating the underlying cause of dehydration. Follow these instructions at home:  If directed by your health care provider, drink an ORS: ? Make an ORS by following instructions on the  package. ? Start by drinking small amounts, about  cup (120 mL) every 5-10 minutes. ? Slowly increase how much you drink until you have taken the amount recommended by your health care provider.  Drink enough clear fluid to keep your urine clear or pale yellow. If you were told to drink an ORS, finish the ORS first, then start slowly drinking other clear fluids. Drink fluids such as: ? Water. Do not drink only water. Doing that can lead to having too little salt (sodium) in the body (hyponatremia). ? Ice chips. ? Fruit juice that you have added water to (diluted fruit juice). ? Low-calorie sports drinks.  Avoid: ? Alcohol. ? Drinks that contain a lot of sugar. These include high-calorie sports drinks, fruit juice that is not diluted, and soda. ? Caffeine. ? Foods that are greasy or contain a lot of fat or sugar.  Take over-the-counter and prescription medicines only as told by your health care provider.  Do not take sodium tablets. This can lead to having too much sodium in the body (hypernatremia).  Eat foods that contain a healthy balance of electrolytes, such as bananas, oranges, potatoes, tomatoes, and spinach.  Keep all follow-up visits as told by your health care provider. This is important. Contact a health care provider if:  You have abdominal pain that: ? Gets worse. ? Stays in one area (localizes).  You have a rash.  You have a stiff neck.  You are more irritable than usual.  You are sleepier or more difficult to wake up than usual.  You feel weak or dizzy.  You feel very thirsty.  You have urinated only a small amount of very dark urine over 6-8 hours. Get help right away if:  You have symptoms of severe dehydration.  You cannot drink fluids without vomiting.  Your symptoms get worse with treatment.  You have a fever.  You have a severe headache.  You have vomiting or diarrhea that: ? Gets worse. ? Does not go away.  You have blood or green matter  (bile) in your vomit.  You have blood in your stool. This may cause stool to look black and tarry.  You have not urinated in 6-8 hours.  You faint.  Your heart rate while sitting still is over 100 beats a minute.  You have trouble breathing. This information is not intended to replace advice given to you by your health care provider. Make sure you discuss any questions you have with your health care provider. Document Released: 03/18/2005 Document Revised: 10/13/2015 Document Reviewed: 05/12/2015 Elsevier Interactive Patient Education  2018 [employer]  

## 2017-11-14 NOTE — Progress Notes (Signed)
   Subjective:    Patient ID: [name], female    DOB: Jan 30, 1946, 72 y.o.   MRN: [id number] Chief Complaint  Patient presents with  . BP and creatinine    recheck   PT presents to the office today to recheck hypotension, dehydration, and elevated creatinine. She was seen yesterday with a BP of 91/55. She was told to hold her HCTZ and Lisinopril. We found her creatinine was elevated to 2.02 from 1.03 just three weeks ago.   Her BP is stable today and she states she have been forcing fluids and feels much better.  Hypertension  This is a chronic problem. The current episode started more than 1 year ago. Pertinent negatives include no peripheral edema or shortness of breath. Past treatments include nothing. The current treatment provides significant improvement.      Review of Systems  Respiratory: Negative for shortness of breath.   All other systems reviewed and are negative.      Objective:   Physical Exam  Constitutional: She is oriented to person, place, and time. She appears well-developed and well-nourished. No distress.  HENT:  Head: Normocephalic and atraumatic.  Eyes: Pupils are equal, round, and reactive to light.  Neck: Normal range of motion. Neck supple. No thyromegaly present.  Cardiovascular: Normal rate, regular rhythm, normal heart sounds and intact distal pulses.  No murmur heard. Pulmonary/Chest: Effort normal and breath sounds normal. No respiratory distress. She has no wheezes.  Abdominal: Soft. Bowel sounds are normal. She exhibits no distension. There is no tenderness.  Musculoskeletal: Normal range of motion. She exhibits no edema or tenderness.  Neurological: She is alert and oriented to person, place, and time. She has normal reflexes. No cranial nerve deficit.  Skin: Skin is warm and dry.  Psychiatric: She has a normal mood and affect. Her behavior is normal. Judgment and thought content normal.  Vitals reviewed.     BP 125/81   Pulse 88    Temp 98.4 F (36.9 C) (Oral)   Ht _0  (1.702 m)   Wt 165 lb 3.2 oz (74.9 kg)   BMI 25.87 kg/m      Assessment & Plan:  [name] comes in today with chief complaint of BP and creatinine (recheck)   Diagnosis and orders addressed:  1. Essential hypertension - BMP8+EGFR  2. Hypotension, unspecified hypotension type - BMP8+EGFR  3. Dehydration - BMP8+EGFR  4. Elevated serum creatinine - BMP8+EGFR  Force fluids Continue to hold Lisinopril and HCTZ Labs pending Keep follow up in 1 week    Evelina Dun, [name]

## 2017-11-15 LAB — BMP8+EGFR
BUN/Creatinine Ratio: 24 (ref 12–28)
BUN: 45 mg/dL — ABNORMAL HIGH (ref 8–27)
CHLORIDE: 95 mmol/L — AB (ref 96–106)
CO2: 24 mmol/L (ref 20–29)
Calcium: 9.7 mg/dL (ref 8.7–10.3)
Creatinine, Ser: 1.87 mg/dL — ABNORMAL HIGH (ref 0.57–1.00)
GFR calc Af Amer: 31 mL/min/{1.73_m2} — ABNORMAL LOW (ref >59)
GFR calc non Af Amer: 27 mL/min/{1.73_m2} — ABNORMAL LOW (ref >59)
GLUCOSE: 102 mg/dL — AB (ref 65–99)
Potassium: 4.2 mmol/L (ref 3.5–5.2)
SODIUM: 136 mmol/L (ref 134–144)

## 2017-11-19 ENCOUNTER — Telehealth: Payer: Self-pay

## 2017-11-19 NOTE — Telephone Encounter (Signed)
Patient saw you on 7/20 and was rxd #30 lyrica 100mg . Pharmacy needs it changed from DAW to generic and would like you to resend but also she is getting this med from a specialist and the last got filled on 7/5 for #90. They wanted to make you aware and also the specialty office was made aware.

## 2017-11-20 NOTE — Telephone Encounter (Signed)
Needs to get from specialty office then

## 2017-11-20 NOTE — Telephone Encounter (Signed)
Spoke to pharmacy and advised to cancel the rx from [facility].

## 2017-11-25 ENCOUNTER — Other Ambulatory Visit: Payer: Self-pay | Admitting: Nurse Practitioner

## 2017-11-25 DIAGNOSIS — E785 Hyperlipidemia, unspecified: Secondary | ICD-10-CM

## 2017-12-05 ENCOUNTER — Ambulatory Visit (INDEPENDENT_AMBULATORY_CARE_PROVIDER_SITE_OTHER): Payer: Medicare Other | Admitting: Family

## 2017-12-05 ENCOUNTER — Encounter: Payer: Self-pay | Admitting: Family

## 2017-12-05 VITALS — BP 129/71 | HR 80 | Temp 97.0°F | Ht 67.0 in | Wt 170.0 lb

## 2017-12-05 DIAGNOSIS — E860 Dehydration: Secondary | ICD-10-CM | POA: Diagnosis not present

## 2017-12-05 DIAGNOSIS — I959 Hypotension, unspecified: Secondary | ICD-10-CM

## 2017-12-05 DIAGNOSIS — R296 Repeated falls: Secondary | ICD-10-CM | POA: Diagnosis not present

## 2017-12-05 LAB — BMP8+EGFR
BUN/Creatinine Ratio: 11 — ABNORMAL LOW (ref 12–28)
BUN: 11 mg/dL (ref 8–27)
CALCIUM: 9 mg/dL (ref 8.7–10.3)
CO2: 23 mmol/L (ref 20–29)
Chloride: 105 mmol/L (ref 96–106)
Creatinine, Ser: 1.04 mg/dL — ABNORMAL HIGH (ref 0.57–1.00)
GFR calc Af Amer: 63 mL/min/{1.73_m2} (ref >59)
GFR, EST NON AFRICAN AMERICAN: 55 mL/min/{1.73_m2} — AB (ref >59)
Glucose: 102 mg/dL — ABNORMAL HIGH (ref 65–99)
Potassium: 4.3 mmol/L (ref 3.5–5.2)
Sodium: 143 mmol/L (ref 134–144)

## 2017-12-05 NOTE — Patient Instructions (Signed)
Dehydration, Adult Dehydration is a condition in which there is not enough fluid or water in the body. This happens when you lose more fluids than you take in. Important organs, such as the kidneys, brain, and heart, cannot function without a proper amount of fluids. Any loss of fluids from the body can lead to dehydration. Dehydration can range from mild to severe. This condition should be treated right away to prevent it from becoming severe. What are the causes? This condition may be caused by:  Vomiting.  Diarrhea.  Excessive sweating, such as from heat exposure or exercise.  Not drinking enough fluid, especially: ? When ill. ? While doing activity that requires a lot of energy.  Excessive urination.  Fever.  Infection.  Certain medicines, such as medicines that cause the body to lose excess fluid (diuretics).  Inability to access safe drinking water.  Reduced physical ability to get adequate water and food.  What increases the risk? This condition is more likely to develop in people:  Who have a poorly controlled long-term (chronic) illness, such as diabetes, heart disease, or kidney disease.  Who are age 65 or older.  Who are disabled.  Who live in a place with high altitude.  Who play endurance sports.  What are the signs or symptoms? Symptoms of mild dehydration may include:  Thirst.  Dry lips.  Slightly dry mouth.  Dry, warm skin.  Dizziness. Symptoms of moderate dehydration may include:  Very dry mouth.  Muscle cramps.  Dark urine. Urine may be the color of tea.  Decreased urine production.  Decreased tear production.  Heartbeat that is irregular or faster than normal (palpitations).  Headache.  Light-headedness, especially when you stand up from a sitting position.  Fainting (syncope). Symptoms of severe dehydration may include:  Changes in skin, such as: ? Cold and clammy skin. ? Blotchy (mottled) or pale skin. ? Skin that does  not quickly return to normal after being lightly pinched and released (poor skin turgor).  Changes in body fluids, such as: ? Extreme thirst. ? No tear production. ? Inability to sweat when body temperature is high, such as in hot weather. ? Very little urine production.  Changes in vital signs, such as: ? Weak pulse. ? Pulse that is more than 100 beats a minute when sitting still. ? Rapid breathing. ? Low blood pressure.  Other changes, such as: ? Sunken eyes. ? Cold hands and feet. ? Confusion. ? Lack of energy (lethargy). ? Difficulty waking up from sleep. ? Short-term weight loss. ? Unconsciousness. How is this diagnosed? This condition is diagnosed based on your symptoms and a physical exam. Blood and urine tests may be done to help confirm the diagnosis. How is this treated? Treatment for this condition depends on the severity. Mild or moderate dehydration can often be treated at home. Treatment should be started right away. Do not wait until dehydration becomes severe. Severe dehydration is an emergency and it needs to be treated in a hospital. Treatment for mild dehydration may include:  Drinking more fluids.  Replacing salts and minerals in your blood (electrolytes) that you may have lost. Treatment for moderate dehydration may include:  Drinking an oral rehydration solution (ORS). This is a drink that helps you replace fluids and electrolytes (rehydrate). It can be found at pharmacies and retail stores. Treatment for severe dehydration may include:  Receiving fluids through an IV tube.  Receiving an electrolyte solution through a feeding tube that is passed through your nose   and into your stomach (nasogastric tube, or NG tube).  Correcting any abnormalities in electrolytes.  Treating the underlying cause of dehydration. Follow these instructions at home:  If directed by your health care provider, drink an ORS: ? Make an ORS by following instructions on the  package. ? Start by drinking small amounts, about  cup (120 mL) every 5-10 minutes. ? Slowly increase how much you drink until you have taken the amount recommended by your health care provider.  Drink enough clear fluid to keep your urine clear or pale yellow. If you were told to drink an ORS, finish the ORS first, then start slowly drinking other clear fluids. Drink fluids such as: ? Water. Do not drink only water. Doing that can lead to having too little salt (sodium) in the body (hyponatremia). ? Ice chips. ? Fruit juice that you have added water to (diluted fruit juice). ? Low-calorie sports drinks.  Avoid: ? Alcohol. ? Drinks that contain a lot of sugar. These include high-calorie sports drinks, fruit juice that is not diluted, and soda. ? Caffeine. ? Foods that are greasy or contain a lot of fat or sugar.  Take over-the-counter and prescription medicines only as told by your health care provider.  Do not take sodium tablets. This can lead to having too much sodium in the body (hypernatremia).  Eat foods that contain a healthy balance of electrolytes, such as bananas, oranges, potatoes, tomatoes, and spinach.  Keep all follow-up visits as told by your health care provider. This is important. Contact a health care provider if:  You have abdominal pain that: ? Gets worse. ? Stays in one area (localizes).  You have a rash.  You have a stiff neck.  You are more irritable than usual.  You are sleepier or more difficult to wake up than usual.  You feel weak or dizzy.  You feel very thirsty.  You have urinated only a small amount of very dark urine over 6-8 hours. Get help right away if:  You have symptoms of severe dehydration.  You cannot drink fluids without vomiting.  Your symptoms get worse with treatment.  You have a fever.  You have a severe headache.  You have vomiting or diarrhea that: ? Gets worse. ? Does not go away.  You have blood or green matter  (bile) in your vomit.  You have blood in your stool. This may cause stool to look black and tarry.  You have not urinated in 6-8 hours.  You faint.  Your heart rate while sitting still is over 100 beats a minute.  You have trouble breathing. This information is not intended to replace advice given to you by your health care provider. Make sure you discuss any questions you have with your health care provider. Document Released: 03/18/2005 Document Revised: 10/13/2015 Document Reviewed: 05/12/2015 Elsevier Interactive Patient Education  2018 [employer]  

## 2017-12-05 NOTE — Progress Notes (Signed)
   Subjective:    Patient ID: [name], female    DOB: Jan 17, 1946, 72 y.o.   MRN: [id number]  Chief Complaint  Patient presents with  . Hypertension    recheck    HPI Pt presents to the office today to recheck blood pressure after stopping HCTZ and Lisinopril. Pt was seen on 11/13/17 with BP of 90's/50's. She was falling. We held both medications and her BP today is normal. Her creatinine was elevated, more than likely related to dehydration.   Denies any swelling in legs, headaches, or fatigue.    Review of Systems  All other systems reviewed and are negative.      Objective:   Physical Exam  Constitutional: She is oriented to person, place, and time. She appears well-developed and well-nourished. No distress.  HENT:  Head: Normocephalic and atraumatic.  Eyes: Pupils are equal, round, and reactive to light.  Neck: Normal range of motion. Neck supple. No thyromegaly present.  Cardiovascular: Normal rate, regular rhythm, normal heart sounds and intact distal pulses.  No murmur heard. Pulmonary/Chest: Effort normal and breath sounds normal. No respiratory distress. She has no wheezes.  Abdominal: Soft. Bowel sounds are normal. She exhibits no distension. There is no tenderness.  Musculoskeletal: She exhibits no edema or tenderness.  Low back pain with flexion and extension  Neurological: She is alert and oriented to person, place, and time. She has normal reflexes. No cranial nerve deficit.  Skin: Skin is warm and dry.  Psychiatric: She has a normal mood and affect. Her behavior is normal. Judgment and thought content normal.  Vitals reviewed.     BP 129/71   Pulse 80   Temp (!) 97 F (36.1 C) (Oral)   Ht '5\' 7"'$  (1.702 m)   Wt 170 lb (77.1 kg)   BMI 26.63 kg/m      Assessment & Plan:  TYLASIA FLETCHALL comes in today with chief complaint of Hypertension (recheck)   Diagnosis and orders addressed:  1. Dehydration - BMP8+EGFR  2. Falls frequently -  BMP8+EGFR  3. Hypotension, unspecified hypotension type - BMP8+EGFR   Symptoms resolved  BP is normal today, will continue to hold Lisinopril and HCTZ   Follow up plan: 3 months    Evelina Dun, [name]

## 2017-12-26 ENCOUNTER — Other Ambulatory Visit: Payer: Self-pay | Admitting: Nurse Practitioner

## 2017-12-26 DIAGNOSIS — I10 Essential (primary) hypertension: Secondary | ICD-10-CM

## 2018-02-05 ENCOUNTER — Encounter: Payer: Self-pay | Admitting: *Deleted

## 2018-03-05 ENCOUNTER — Ambulatory Visit (INDEPENDENT_AMBULATORY_CARE_PROVIDER_SITE_OTHER): Payer: Medicare Other | Admitting: Family

## 2018-03-05 ENCOUNTER — Encounter: Payer: Self-pay | Admitting: Family

## 2018-03-05 VITALS — BP 136/72 | HR 75 | Temp 97.2°F | Ht 67.0 in | Wt 176.2 lb

## 2018-03-05 DIAGNOSIS — F411 Generalized anxiety disorder: Secondary | ICD-10-CM | POA: Diagnosis not present

## 2018-03-05 DIAGNOSIS — Z1212 Encounter for screening for malignant neoplasm of rectum: Secondary | ICD-10-CM

## 2018-03-05 DIAGNOSIS — M5442 Lumbago with sciatica, left side: Secondary | ICD-10-CM

## 2018-03-05 DIAGNOSIS — Z1211 Encounter for screening for malignant neoplasm of colon: Secondary | ICD-10-CM

## 2018-03-05 DIAGNOSIS — I10 Essential (primary) hypertension: Secondary | ICD-10-CM | POA: Diagnosis not present

## 2018-03-05 DIAGNOSIS — M5441 Lumbago with sciatica, right side: Secondary | ICD-10-CM

## 2018-03-05 DIAGNOSIS — F17200 Nicotine dependence, unspecified, uncomplicated: Secondary | ICD-10-CM | POA: Diagnosis not present

## 2018-03-05 DIAGNOSIS — E785 Hyperlipidemia, unspecified: Secondary | ICD-10-CM | POA: Diagnosis not present

## 2018-03-05 DIAGNOSIS — K219 Gastro-esophageal reflux disease without esophagitis: Secondary | ICD-10-CM

## 2018-03-05 DIAGNOSIS — G8929 Other chronic pain: Secondary | ICD-10-CM

## 2018-03-05 DIAGNOSIS — I739 Peripheral vascular disease, unspecified: Secondary | ICD-10-CM

## 2018-03-05 DIAGNOSIS — F331 Major depressive disorder, recurrent, moderate: Secondary | ICD-10-CM

## 2018-03-05 LAB — CBC WITH DIFFERENTIAL/PLATELET
BASOS: 0 %
Basophils Absolute: 0 10*3/uL (ref 0.0–0.2)
EOS (ABSOLUTE): 0.1 10*3/uL (ref 0.0–0.4)
EOS: 2 %
HEMATOCRIT: 38.7 % (ref 34.0–46.6)
Hemoglobin: 12.7 g/dL (ref 11.1–15.9)
Immature Grans (Abs): 0 10*3/uL (ref 0.0–0.1)
Immature Granulocytes: 0 %
Lymphocytes Absolute: 1.8 10*3/uL (ref 0.7–3.1)
Lymphs: 26 %
MCH: 28.3 pg (ref 26.6–33.0)
MCHC: 32.8 g/dL (ref 31.5–35.7)
MCV: 86 fL (ref 79–97)
MONOS ABS: 0.8 10*3/uL (ref 0.1–0.9)
Monocytes: 12 %
NEUTROS ABS: 4.2 10*3/uL (ref 1.4–7.0)
NEUTROS PCT: 60 %
Platelets: 249 10*3/uL (ref 150–450)
RBC: 4.49 x10E6/uL (ref 3.77–5.28)
RDW: 14.3 % (ref 12.3–15.4)
WBC: 7 10*3/uL (ref 3.4–10.8)

## 2018-03-05 LAB — CMP14+EGFR
ALBUMIN: 4 g/dL (ref 3.5–4.8)
ALK PHOS: 88 IU/L (ref 39–117)
ALT: 17 IU/L (ref 0–32)
AST: 30 IU/L (ref 0–40)
Albumin/Globulin Ratio: 1.5 (ref 1.2–2.2)
BUN/Creatinine Ratio: 13 (ref 12–28)
BUN: 13 mg/dL (ref 8–27)
Bilirubin Total: 0.2 mg/dL (ref 0.0–1.2)
CALCIUM: 9 mg/dL (ref 8.7–10.3)
CO2: 22 mmol/L (ref 20–29)
CREATININE: 1 mg/dL (ref 0.57–1.00)
Chloride: 106 mmol/L (ref 96–106)
GFR calc Af Amer: 66 mL/min/{1.73_m2} (ref >59)
GFR, EST NON AFRICAN AMERICAN: 57 mL/min/{1.73_m2} — AB (ref >59)
GLUCOSE: 110 mg/dL — AB (ref 65–99)
Globulin, Total: 2.7 g/dL (ref 1.5–4.5)
Potassium: 3.7 mmol/L (ref 3.5–5.2)
Sodium: 144 mmol/L (ref 134–144)
Total Protein: 6.7 g/dL (ref 6.0–8.5)

## 2018-03-05 LAB — LIPID PANEL
CHOL/HDL RATIO: 3.9 ratio (ref 0.0–4.4)
CHOLESTEROL TOTAL: 132 mg/dL (ref 100–199)
HDL: 34 mg/dL — ABNORMAL LOW (ref >39)
LDL CALC: 68 mg/dL (ref 0–99)
TRIGLYCERIDES: 150 mg/dL — AB (ref 0–149)
VLDL CHOLESTEROL CAL: 30 mg/dL (ref 5–40)

## 2018-03-05 MED ORDER — OMEPRAZOLE 20 MG PO CPDR: 20.0000 mg | DELAYED_RELEASE_CAPSULE | Freq: Every day | ORAL | 3 refills | Status: DC

## 2018-03-05 NOTE — Patient Instructions (Signed)

## 2018-03-05 NOTE — Progress Notes (Signed)
Subjective:    Patient ID: [name], female    DOB: [birth year], 72 y.o.   MRN: [id number]  Chief Complaint  Patient presents with  . Medical Management of Chronic Issues    three month recheck   Pt presents to the office today for chronic follow up. She is followed by [facility] as needed for lumbar injections. She has a stimulator present that helps with the pain.  Hypertension  This is a chronic problem. The current episode started more than 1 year ago. The problem has been resolved since onset. The problem is controlled. Associated symptoms include anxiety and malaise/fatigue. Pertinent negatives include no headaches, peripheral edema or shortness of breath. Risk factors for coronary artery disease include sedentary lifestyle, smoking/tobacco exposure and family history. The current treatment provides moderate improvement. There is no history of kidney disease, CAD/MI, CVA or heart failure.  Anxiety  Presents for follow-up visit. Symptoms include decreased concentration, excessive worry, irritability and nervous/anxious behavior. Patient reports no shortness of breath. Symptoms occur most days. The severity of symptoms is moderate.    Depression         This is a chronic problem.  The current episode started more than 1 year ago.   The onset quality is gradual.   The problem occurs intermittently.  Associated symptoms include decreased concentration, irritable, decreased interest and sad.  Associated symptoms include no helplessness, no hopelessness and no headaches.  Past treatments include SNRIs - Serotonin and norepinephrine reuptake inhibitors.  Past medical history includes anxiety.   Hyperlipidemia  This is a chronic problem. The current episode started more than 1 year ago. The problem is controlled. Recent lipid tests were reviewed and are normal. Associated symptoms include leg pain. Pertinent negatives include no shortness of breath. Current antihyperlipidemic treatment  includes statins. The current treatment provides moderate improvement of lipids. Risk factors for coronary artery disease include hypertension and a sedentary lifestyle.  Gastroesophageal Reflux  She complains of belching and heartburn. She reports no coughing. This is a chronic problem. The current episode started more than 1 year ago. The problem occurs constantly. The symptoms are aggravated by smoking and certain foods. Risk factors include smoking/tobacco exposure. She has tried an antacid for the symptoms. The treatment provided mild relief.  Back Pain  This is a chronic problem. The current episode started more than 1 year ago. The problem occurs intermittently. The problem has been waxing and waning since onset. The pain is present in the lumbar spine. The pain is at a severity of 8/10. The pain is moderate. The symptoms are aggravated by bending and standing. Associated symptoms include leg pain. Pertinent negatives include no bladder incontinence, bowel incontinence or headaches. Risk factors include sedentary lifestyle. The treatment provided mild relief.      Review of Systems  Constitutional: Positive for irritability and malaise/fatigue.  Respiratory: Negative for cough and shortness of breath.   Gastrointestinal: Positive for heartburn. Negative for bowel incontinence.  Genitourinary: Negative for bladder incontinence.  Musculoskeletal: Positive for back pain.  Neurological: Negative for headaches.  Psychiatric/Behavioral: Positive for decreased concentration and depression. The patient is nervous/anxious.   All other systems reviewed and are negative.      Objective:   Physical Exam  Constitutional: She is oriented to person, place, and time. She appears well-developed and well-nourished. She is irritable. No distress.  HENT:  Head: Normocephalic and atraumatic.  Right Ear: External ear normal.  Left Ear: External ear normal.  Mouth/Throat: Oropharynx is  clear and moist.    Eyes: Pupils are equal, round, and reactive to light.  Neck: Normal range of motion. Neck supple. No thyromegaly present.  Cardiovascular: Normal rate, regular rhythm, normal heart sounds and intact distal pulses.  No murmur heard. Pulmonary/Chest: Effort normal. No respiratory distress. She has decreased breath sounds. She has no wheezes.  Abdominal: Soft. Bowel sounds are normal. She exhibits no distension. There is no tenderness.  Musculoskeletal: She exhibits no edema or tenderness.  Pain in lower lumbar with flexion and extension  Neurological: She is alert and oriented to person, place, and time. She has normal reflexes. No cranial nerve deficit.  Skin: Skin is warm and dry.  Psychiatric: She has a normal mood and affect. Her behavior is normal. Judgment and thought content normal.  crying  Vitals reviewed.     BP 136/72   Pulse 75   Temp (!) 97.2 F (36.2 C) (Oral)   Ht _0  (1.702 m)   Wt 176 lb 3.2 oz (79.9 kg)   BMI 27.60 kg/m       Assessment & Plan:  FAUNA NEUNER comes in today with chief complaint of Medical Management of Chronic Issues (three month recheck)   Diagnosis and orders addressed:  1. Essential hypertension - CBC with Differential/Platelet - CMP14+EGFR  2. GAD (generalized anxiety disorder) - CBC with Differential/Platelet - CMP14+EGFR  3. TOBACCO ABUSE Smoking cessation  - CBC with Differential/Platelet - CMP14+EGFR  4. Hyperlipidemia with target LDL less than 100 - CBC with Differential/Platelet - CMP14+EGFR - Lipid panel  5. Gastroesophageal reflux disease without esophagitis - CBC with Differential/Platelet - CMP14+EGFR - omeprazole (PRILOSEC) 20 MG capsule; Take 1 capsule (20 mg total) by mouth daily.  Dispense: 90 capsule; Refill: 3  6. Peripheral vascular disease ([place]) - CBC with Differential/Platelet - CMP14+EGFR  7. Chronic bilateral low back pain with bilateral sciatica - CBC with Differential/Platelet -  CMP14+EGFR  8. Moderate episode of recurrent major depressive disorder (HCC) - CBC with Differential/Platelet - CMP14+EGFR  9. Colon cancer screening - Cologuard - CBC with Differential/Platelet - CMP14+EGFR  10. Screening for malignant neoplasm of the rectum - Cologuard - CBC with Differential/Platelet - CMP14+EGFR   Labs pending Health Maintenance reviewed Diet and exercise encouraged  Follow up plan: 6 months    Evelina Dun, [name]

## 2018-03-06 ENCOUNTER — Ambulatory Visit: Payer: Medicare Other | Admitting: Family

## 2018-03-17 ENCOUNTER — Telehealth: Payer: Self-pay

## 2018-03-17 NOTE — Telephone Encounter (Signed)
[facility] - Left Message  

## 2018-03-27 ENCOUNTER — Telehealth: Payer: Self-pay

## 2018-03-27 NOTE — Telephone Encounter (Signed)
3rd attempt - [facility]   Patient had a PHQ score of 17 from the [employer].  Several attempts have been made to contact patient without success.   When the patient comes in for a scheduled appointment [facility] able to assess the patient on Tele Psych.  [facility] can be contacted at [phone].    Information will be routed to the PCP and Dr. Vanetta ShawlHisada

## 2018-04-09 ENCOUNTER — Telehealth: Payer: Self-pay

## 2018-04-09 NOTE — Telephone Encounter (Signed)
[facility] - Inactive.  Patient has a PHQ score of 17 on the [employer] Report on 03-05-2018

## 2018-04-17 ENCOUNTER — Encounter: Payer: Self-pay | Admitting: Family

## 2018-04-17 ENCOUNTER — Ambulatory Visit (INDEPENDENT_AMBULATORY_CARE_PROVIDER_SITE_OTHER): Payer: Medicare Other | Admitting: Family

## 2018-04-17 VITALS — BP 173/84 | HR 83 | Temp 97.8°F | Ht 67.0 in | Wt 173.2 lb

## 2018-04-17 DIAGNOSIS — I10 Essential (primary) hypertension: Secondary | ICD-10-CM

## 2018-04-17 DIAGNOSIS — R5383 Other fatigue: Secondary | ICD-10-CM | POA: Diagnosis not present

## 2018-04-17 DIAGNOSIS — R42 Dizziness and giddiness: Secondary | ICD-10-CM

## 2018-04-17 DIAGNOSIS — M5416 Radiculopathy, lumbar region: Secondary | ICD-10-CM

## 2018-04-17 DIAGNOSIS — M5137 Other intervertebral disc degeneration, lumbosacral region: Secondary | ICD-10-CM

## 2018-04-17 NOTE — Progress Notes (Signed)
Subjective:    Patient ID: [name], female    DOB: [birth year], 73 y.o.   MRN: [id number]  Chief Complaint  Patient presents with  . Hypotension  . Dizziness   PT presents to the office complaining of dizziness and hypotension. States she stood up today and felt dizziness and fell the ground. She reports she checked her BP and it was 100/ 56.  She reports fatigue and tiredness.  Hypertension  This is a chronic problem. The current episode started more than 1 year ago. The problem has been waxing and waning since onset. The problem is uncontrolled. Associated symptoms include headaches, malaise/fatigue and shortness of breath ("a little"). Pertinent negatives include no palpitations or peripheral edema. Past treatments include ACE inhibitors. The current treatment provides no improvement. There is no history of kidney disease, CAD/MI, CVA or heart failure.  Dizziness  This is a recurrent problem. The current episode started 1 to 4 weeks ago. The problem occurs intermittently. Associated symptoms include fatigue, headaches and nausea. Pertinent negatives include no chills, congestion, coughing or visual change. The symptoms are aggravated by bending. She has tried lying down for the symptoms. The treatment provided no relief.      Review of Systems  Constitutional: Positive for fatigue and malaise/fatigue. Negative for chills.  HENT: Negative for congestion.   Respiratory: Positive for shortness of breath ("a little"). Negative for cough.   Cardiovascular: Negative for palpitations.  Gastrointestinal: Positive for nausea.  Neurological: Positive for dizziness and headaches.       Objective:   Physical Exam Vitals signs reviewed.  Constitutional:      General: She is not in acute distress.    Appearance: She is well-developed.     Comments: Falling asleep during exam  HENT:     Head: Normocephalic and atraumatic.     Right Ear: External ear normal.  Eyes:     Pupils:  Pupils are equal, round, and reactive to light.  Neck:     Musculoskeletal: Normal range of motion and neck supple.     Thyroid: No thyromegaly.  Cardiovascular:     Rate and Rhythm: Normal rate and regular rhythm.     Heart sounds: Normal heart sounds. No murmur.  Pulmonary:     Effort: Pulmonary effort is normal. No respiratory distress.     Breath sounds: Normal breath sounds. No wheezing.  Abdominal:     General: Bowel sounds are normal. There is no distension.     Palpations: Abdomen is soft.     Tenderness: There is no abdominal tenderness.  Musculoskeletal: Normal range of motion.        General: No tenderness.  Skin:    General: Skin is warm and dry.  Neurological:     Mental Status: She is alert and oriented to person, place, and time.     Cranial Nerves: No cranial nerve deficit.     Deep Tendon Reflexes: Reflexes are normal and symmetric.  Psychiatric:        Mood and Affect: Affect is flat.        Behavior: Behavior normal.        Thought Content: Thought content normal.        Judgment: Judgment normal.      BP (!) 171/70   Pulse 83   Temp 97.8 F (36.6 C) (Oral)   Ht 5' 7"  (1.702 m)   Wt 173 lb 3.2 oz (78.6 kg)   BMI 27.13 kg/m  Assessment & Plan:  JERIANN SAYRES comes in today with chief complaint of Hypotension and Dizziness   Diagnosis and orders addressed:  1. Fatigue, unspecified type I believe she is too sedated. I recommend she hold off on all Lyrica and Zanaflex for the next few days. After she is feeling normal, she can restart the Lyrica 200 mg at night only. She is going to stay with her daughter this weekend.  Get up slow when standing Fall preventions discussed - CMP14+EGFR - CBC with Differential/Platelet - TSH - EKG 12-Lead  2. Essential hypertension I will hold off on medication changes at this time as her BP was lower this AM -Dash diet information given -Exercise encouraged - Stress Management  -Continue current  meds -RTO in 1 week  - CMP14+EGFR - CBC with Differential/Platelet  3. Dizziness I believe this is related to being over medicated.  Long discussion with patient about fall risks and sedation   4. Lumbar radiculopathy  5. DDD (degenerative disc disease), lumbosacral    Follow up plan: 1 week    Evelina Dun, [name]

## 2018-04-17 NOTE — Patient Instructions (Signed)
Dizziness Dizziness is a common problem. It is a feeling of unsteadiness or light-headedness. You may feel like you are about to faint. Dizziness can lead to injury if you stumble or fall. Anyone can become dizzy, but dizziness is more common in older adults. This condition can be caused by a number of things, including medicines, dehydration, or illness. Follow these instructions at home: Eating and drinking  Drink enough fluid to keep your urine clear or pale yellow. This helps to keep you from becoming dehydrated. Try to drink more clear fluids, such as water.  Do not drink alcohol.  Limit your caffeine intake if told to do so by your health care provider. Check ingredients and nutrition facts to see if a food or beverage contains caffeine.  Limit your salt (sodium) intake if told to do so by your health care provider. Check ingredients and nutrition facts to see if a food or beverage contains sodium. Activity  Avoid making quick movements. ? Rise slowly from chairs and steady yourself until you feel okay. ? In the morning, first sit up on the side of the bed. When you feel okay, stand slowly while you hold onto something until you know that your balance is fine.  If you need to stand in one place for a long time, move your legs often. Tighten and relax the muscles in your legs while you are standing.  Do not drive or use heavy machinery if you feel dizzy.  Avoid bending down if you feel dizzy. Place items in your home so that they are easy for you to reach without leaning over. Lifestyle  Do not use any products that contain nicotine or tobacco, such as cigarettes and e-cigarettes. If you need help quitting, ask your health care provider.  Try to reduce your stress level by using methods such as yoga or meditation. Talk with your health care provider if you need help to manage your stress. General instructions  Watch your dizziness for any changes.  Take over-the-counter and  prescription medicines only as told by your health care provider. Talk with your health care provider if you think that your dizziness is caused by a medicine that you are taking.  Tell a friend or a family member that you are feeling dizzy. If he or she notices any changes in your behavior, have this person call your health care provider.  Keep all follow-up visits as told by your health care provider. This is important. Contact a health care provider if:  Your dizziness does not go away.  Your dizziness or light-headedness gets worse.  You feel nauseous.  You have reduced hearing.  You have new symptoms.  You are unsteady on your feet or you feel like the room is spinning. Get help right away if:  You vomit or have diarrhea and are unable to eat or drink anything.  You have problems talking, walking, swallowing, or using your arms, hands, or legs.  You feel generally weak.  You are not thinking clearly or you have trouble forming sentences. It may take a friend or family member to notice this.  You have chest pain, abdominal pain, shortness of breath, or sweating.  Your vision changes.  You have any bleeding.  You have a severe headache.  You have neck pain or a stiff neck.  You have a fever. These symptoms may represent a serious problem that is an emergency. Do not wait to see if the symptoms will go away. Get medical help   right away. Call your local emergency services (911 in the [place] Do not drive yourself to the hospital. Summary  Dizziness is a feeling of unsteadiness or light-headedness. This condition can be caused by a number of things, including medicines, dehydration, or illness.  Anyone can become dizzy, but dizziness is more common in older adults.  Drink enough fluid to keep your urine clear or pale yellow. Do not drink alcohol.  Avoid making quick movements if you feel dizzy. Monitor your dizziness for any changes. This information is not intended to  replace advice given to you by your health care provider. Make sure you discuss any questions you have with your health care provider. Document Released: 09/11/2000 Document Revised: 04/20/2016 Document Reviewed: 04/20/2016 Elsevier Interactive Patient Education  2019 [employer]  

## 2018-04-18 LAB — CMP14+EGFR
A/G RATIO: 1.3 (ref 1.2–2.2)
ALT: 12 IU/L (ref 0–32)
AST: 22 IU/L (ref 0–40)
Albumin: 4 g/dL (ref 3.5–4.8)
Alkaline Phosphatase: 128 IU/L — ABNORMAL HIGH (ref 39–117)
BUN/Creatinine Ratio: 11 — ABNORMAL LOW (ref 12–28)
BUN: 13 mg/dL (ref 8–27)
Bilirubin Total: 0.3 mg/dL (ref 0.0–1.2)
CO2: 22 mmol/L (ref 20–29)
Calcium: 9.3 mg/dL (ref 8.7–10.3)
Chloride: 101 mmol/L (ref 96–106)
Creatinine, Ser: 1.15 mg/dL — ABNORMAL HIGH (ref 0.57–1.00)
GFR calc Af Amer: 55 mL/min/{1.73_m2} — ABNORMAL LOW (ref >59)
GFR calc non Af Amer: 48 mL/min/{1.73_m2} — ABNORMAL LOW (ref >59)
GLOBULIN, TOTAL: 3.2 g/dL (ref 1.5–4.5)
Glucose: 131 mg/dL — ABNORMAL HIGH (ref 65–99)
POTASSIUM: 3.8 mmol/L (ref 3.5–5.2)
SODIUM: 140 mmol/L (ref 134–144)
Total Protein: 7.2 g/dL (ref 6.0–8.5)

## 2018-04-18 LAB — CBC WITH DIFFERENTIAL/PLATELET
Basophils Absolute: 0.1 10*3/uL (ref 0.0–0.2)
Basos: 1 %
EOS (ABSOLUTE): 0.4 10*3/uL (ref 0.0–0.4)
Eos: 4 %
Hematocrit: 39.3 % (ref 34.0–46.6)
Hemoglobin: 13.2 g/dL (ref 11.1–15.9)
Immature Grans (Abs): 0 10*3/uL (ref 0.0–0.1)
Immature Granulocytes: 0 %
Lymphocytes Absolute: 1.8 10*3/uL (ref 0.7–3.1)
Lymphs: 15 %
MCH: 28.3 pg (ref 26.6–33.0)
MCHC: 33.6 g/dL (ref 31.5–35.7)
MCV: 84 fL (ref 79–97)
Monocytes Absolute: 1.1 10*3/uL — ABNORMAL HIGH (ref 0.1–0.9)
Monocytes: 9 %
Neutrophils Absolute: 8.5 10*3/uL — ABNORMAL HIGH (ref 1.4–7.0)
Neutrophils: 71 %
Platelets: 496 10*3/uL — ABNORMAL HIGH (ref 150–450)
RBC: 4.66 x10E6/uL (ref 3.77–5.28)
RDW: 13.7 % (ref 11.7–15.4)
WBC: 11.9 10*3/uL — ABNORMAL HIGH (ref 3.4–10.8)

## 2018-04-18 LAB — TSH: TSH: 1.42 u[IU]/mL (ref 0.450–4.500)

## 2018-04-21 ENCOUNTER — Telehealth: Payer: Self-pay | Admitting: Family

## 2018-04-21 NOTE — Telephone Encounter (Signed)
Patient has a follow up appointment scheduled for Friday and will discuss medication then.  She is feeling better since decreasing lyrica.

## 2018-04-24 ENCOUNTER — Ambulatory Visit (INDEPENDENT_AMBULATORY_CARE_PROVIDER_SITE_OTHER): Payer: Medicare Other | Admitting: Family

## 2018-04-24 ENCOUNTER — Encounter: Payer: Self-pay | Admitting: Family

## 2018-04-24 VITALS — BP 189/95 | HR 90 | Temp 97.4°F | Ht 67.0 in | Wt 171.2 lb

## 2018-04-24 DIAGNOSIS — M5442 Lumbago with sciatica, left side: Secondary | ICD-10-CM | POA: Diagnosis not present

## 2018-04-24 DIAGNOSIS — Z9114 Patient's other noncompliance with medication regimen: Secondary | ICD-10-CM

## 2018-04-24 DIAGNOSIS — G8929 Other chronic pain: Secondary | ICD-10-CM

## 2018-04-24 DIAGNOSIS — M5441 Lumbago with sciatica, right side: Secondary | ICD-10-CM | POA: Diagnosis not present

## 2018-04-24 DIAGNOSIS — I10 Essential (primary) hypertension: Secondary | ICD-10-CM | POA: Diagnosis not present

## 2018-04-24 MED ORDER — PREGABALIN 75 MG PO CAPS: 75.0000 mg | ORAL_CAPSULE | Freq: Two times a day (BID) | ORAL | 3 refills | Status: DC

## 2018-04-24 MED ORDER — LISINOPRIL 20 MG PO TABS: 20.0000 mg | ORAL_TABLET | Freq: Every day | ORAL | 3 refills | Status: DC

## 2018-04-24 NOTE — Patient Instructions (Signed)

## 2018-04-24 NOTE — Progress Notes (Signed)
Subjective:    Patient ID: [name], female    DOB: 01-26-1946, 73 y.o.   MRN: [id number]  Chief Complaint  Patient presents with  . follow up with dizziness and feeling faint    wants to sleep alot   Pt was seen on 04/17/18 with fatigue, frequent falls, and extreme fatigue. Pt had been taking Lyrica 200 mg TID and Zanaflex as needed. She was overly sedated on our visit.  Hypertension  This is a chronic problem. The current episode started more than 1 year ago. The problem is unchanged. The problem is uncontrolled. Associated symptoms include malaise/fatigue. Pertinent negatives include no chest pain, peripheral edema or shortness of breath. Past treatments include ACE inhibitors. The current treatment provides mild improvement.  Back Pain  This is a chronic problem. The current episode started more than 1 year ago. The problem occurs intermittently. The problem has been waxing and waning since onset. The pain is present in the lumbar spine. The pain is at a severity of 9/10. The pain is moderate. Associated symptoms include tingling and weakness. Pertinent negatives include no bladder incontinence, bowel incontinence or chest pain. She has tried muscle relaxant for the symptoms. The treatment provided mild relief.      Review of Systems  Constitutional: Positive for malaise/fatigue.  Respiratory: Negative for shortness of breath.   Cardiovascular: Negative for chest pain.  Gastrointestinal: Negative for bowel incontinence.  Genitourinary: Negative for bladder incontinence.  Musculoskeletal: Positive for back pain.  Neurological: Positive for tingling and weakness.  All other systems reviewed and are negative.      Objective:   Physical Exam Vitals signs reviewed.  Constitutional:      General: She is not in acute distress.    Appearance: She is well-developed.  HENT:     Head: Normocephalic and atraumatic.     Right Ear: Tympanic membrane normal.     Left Ear:  Tympanic membrane normal.     Mouth/Throat:     Comments: Hoarse voice Eyes:     Pupils: Pupils are equal, round, and reactive to light.  Neck:     Musculoskeletal: Normal range of motion and neck supple.     Thyroid: No thyromegaly.  Cardiovascular:     Rate and Rhythm: Normal rate and regular rhythm.     Heart sounds: Normal heart sounds. No murmur.  Pulmonary:     Effort: Pulmonary effort is normal. No respiratory distress.     Breath sounds: Normal breath sounds. No wheezing.  Abdominal:     General: Bowel sounds are normal. There is no distension.     Palpations: Abdomen is soft.     Tenderness: There is no abdominal tenderness.  Musculoskeletal: Normal range of motion.        General: No tenderness.  Skin:    General: Skin is warm and dry.  Neurological:     Mental Status: She is alert and oriented to person, place, and time.     Cranial Nerves: No cranial nerve deficit.     Deep Tendon Reflexes: Reflexes are normal and symmetric.  Psychiatric:        Behavior: Behavior normal.        Thought Content: Thought content normal.        Judgment: Judgment normal.        BP (!) 189/95   Pulse 90   Temp (!) 97.4 F (36.3 C) (Oral)   Ht 5' 7"  (1.702 m)   Wt 171 lb  3.2 oz (77.7 kg)   BMI 26.81 kg/m      Assessment & Plan:  [name] comes in today with chief complaint of follow up with dizziness and feeling faint (wants to sleep alot)   Diagnosis and orders addressed:  1. Essential hypertension We will increase lisnopril to 20 mg from 10 mg -Dash diet information given -Exercise encouraged - Stress Management  -Continue current meds -RTO in 1 week  - lisinopril (PRINIVIL,ZESTRIL) 20 MG tablet; Take 1 tablet (20 mg total) by mouth daily.  Dispense: 90 tablet; Refill: 3 - CMP14+EGFR  2. Chronic bilateral low back pain with bilateral sciatica We will decrease Lyrica to 75 mg as needed. I still want her to avoid taking medication and take as needed for  pain - pregabalin (LYRICA) 75 MG capsule; Take 1 capsule (75 mg total) by mouth 2 (two) times daily.  Dispense: 60 capsule; Refill: 3 - CMP14+EGFR  3. Overuse of medication We will decrease Lyrica - CMP14+EGFR   Labs pending   Follow up plan: 1 week   Danielle Dun, [name]

## 2018-05-01 ENCOUNTER — Ambulatory Visit (INDEPENDENT_AMBULATORY_CARE_PROVIDER_SITE_OTHER): Payer: Medicare Other | Admitting: Family

## 2018-05-01 ENCOUNTER — Encounter: Payer: Self-pay | Admitting: Family

## 2018-05-01 VITALS — BP 149/71 | HR 77 | Temp 98.1°F | Ht 67.0 in | Wt 168.6 lb

## 2018-05-01 DIAGNOSIS — M5416 Radiculopathy, lumbar region: Secondary | ICD-10-CM | POA: Diagnosis not present

## 2018-05-01 DIAGNOSIS — M5441 Lumbago with sciatica, right side: Secondary | ICD-10-CM

## 2018-05-01 DIAGNOSIS — M5442 Lumbago with sciatica, left side: Secondary | ICD-10-CM

## 2018-05-01 DIAGNOSIS — F17200 Nicotine dependence, unspecified, uncomplicated: Secondary | ICD-10-CM

## 2018-05-01 DIAGNOSIS — I10 Essential (primary) hypertension: Secondary | ICD-10-CM | POA: Diagnosis not present

## 2018-05-01 DIAGNOSIS — G8929 Other chronic pain: Secondary | ICD-10-CM

## 2018-05-01 MED ORDER — PREGABALIN 75 MG PO CAPS: 75.0000 mg | ORAL_CAPSULE | Freq: Three times a day (TID) | ORAL | 3 refills | Status: DC

## 2018-05-01 NOTE — Progress Notes (Signed)
Subjective:    Patient ID: [name], female    DOB: 18-Jun-1945, 73 y.o.   MRN: [id number]  Chief Complaint  Patient presents with  . recheck blood pressure    Hypertension  This is a chronic problem. The current episode started more than 1 year ago. The problem has been waxing and waning since onset. Associated symptoms include headaches and malaise/fatigue. Pertinent negatives include no peripheral edema or shortness of breath. The current treatment provides moderate improvement. There is no history of CAD/MI, CVA or heart failure.  Back Pain  This is a chronic problem. The current episode started more than 1 year ago. The problem occurs intermittently. The problem has been waxing and waning since onset. The pain is present in the lumbar spine. The quality of the pain is described as burning and aching. The pain is at a severity of 8/10. The pain is moderate. Associated symptoms include headaches, leg pain and weakness. Pertinent negatives include no dysuria.      Review of Systems  Constitutional: Positive for malaise/fatigue.  Respiratory: Negative for shortness of breath.   Genitourinary: Negative for dysuria.  Musculoskeletal: Positive for back pain.  Neurological: Positive for weakness and headaches.  All other systems reviewed and are negative.      Objective:   Physical Exam Vitals signs reviewed.  Constitutional:      General: She is not in acute distress.    Appearance: She is well-developed.  HENT:     Head: Normocephalic and atraumatic.  Eyes:     Pupils: Pupils are equal, round, and reactive to light.  Neck:     Musculoskeletal: Normal range of motion and neck supple.     Thyroid: No thyromegaly.  Cardiovascular:     Rate and Rhythm: Normal rate and regular rhythm.     Heart sounds: Normal heart sounds. No murmur.  Pulmonary:     Effort: Pulmonary effort is normal. No respiratory distress.     Breath sounds: Normal breath sounds. No wheezing.    Abdominal:     General: Bowel sounds are normal. There is no distension.     Palpations: Abdomen is soft.     Tenderness: There is no abdominal tenderness.  Musculoskeletal:        General: No tenderness.  Skin:    General: Skin is warm and dry.  Neurological:     Mental Status: She is alert and oriented to person, place, and time.     Cranial Nerves: No cranial nerve deficit.     Deep Tendon Reflexes: Reflexes are normal and symmetric.  Psychiatric:        Behavior: Behavior normal.        Thought Content: Thought content normal.        Judgment: Judgment normal.     BP (!) 149/71   Pulse 77   Temp 98.1 F (36.7 C) (Oral)   Ht _0  (1.702 m)   Wt 168 lb 9.6 oz (76.5 kg)   BMI 26.41 kg/m      Assessment & Plan:  VONCILE SCHWARZ comes in today with chief complaint of recheck blood pressure   Diagnosis and orders addressed:  1. Essential hypertension Given age and hx of hypotension, we will keep medications the same today -Dash diet information given -Exercise encouraged - Stress Management  -Continue current meds -Encouraged patient to bring BP machine from home with her on next visit to make sure she has correct readings - BMP8+EGFR  2.  Lumbar radiculopathy - BMP8+EGFR - pregabalin (LYRICA) 75 MG capsule; Take 1 capsule (75 mg total) by mouth 3 (three) times daily.  Dispense: 90 capsule; Refill: 3  3. Chronic bilateral low back pain with bilateral sciatica - BMP8+EGFR - pregabalin (LYRICA) 75 MG capsule; Take 1 capsule (75 mg total) by mouth 3 (three) times daily.  Dispense: 90 capsule; Refill: 3  4. TOBACCO ABUSE Smoking cessation discussed - BMP8+EGFR   Keep all follow up with Pain management  We will increase Lyrica to 75 mg TID as needed, discussed sides of over sedation and to decrease back to BID if she has those symptoms  RTO in 3 months     Evelina Dun, [name]

## 2018-05-01 NOTE — Patient Instructions (Signed)

## 2018-05-22 ENCOUNTER — Ambulatory Visit (INDEPENDENT_AMBULATORY_CARE_PROVIDER_SITE_OTHER): Payer: Medicare Other | Admitting: Family

## 2018-05-22 ENCOUNTER — Encounter: Payer: Self-pay | Admitting: Family

## 2018-05-22 VITALS — BP 138/74 | HR 90 | Temp 98.5°F | Ht 67.0 in | Wt 165.6 lb

## 2018-05-22 DIAGNOSIS — M5416 Radiculopathy, lumbar region: Secondary | ICD-10-CM

## 2018-05-22 DIAGNOSIS — M5441 Lumbago with sciatica, right side: Secondary | ICD-10-CM

## 2018-05-22 DIAGNOSIS — R29818 Other symptoms and signs involving the nervous system: Secondary | ICD-10-CM | POA: Diagnosis not present

## 2018-05-22 DIAGNOSIS — Z981 Arthrodesis status: Secondary | ICD-10-CM

## 2018-05-22 DIAGNOSIS — G8929 Other chronic pain: Secondary | ICD-10-CM

## 2018-05-22 DIAGNOSIS — M5442 Lumbago with sciatica, left side: Secondary | ICD-10-CM

## 2018-05-22 DIAGNOSIS — R2689 Other abnormalities of gait and mobility: Secondary | ICD-10-CM | POA: Diagnosis not present

## 2018-05-22 DIAGNOSIS — I10 Essential (primary) hypertension: Secondary | ICD-10-CM

## 2018-05-22 DIAGNOSIS — M4317 Spondylolisthesis, lumbosacral region: Secondary | ICD-10-CM

## 2018-05-22 DIAGNOSIS — F17200 Nicotine dependence, unspecified, uncomplicated: Secondary | ICD-10-CM

## 2018-05-22 DIAGNOSIS — R296 Repeated falls: Secondary | ICD-10-CM | POA: Diagnosis not present

## 2018-05-22 LAB — CMP14+EGFR
ALT: 10 IU/L (ref 0–32)
AST: 18 IU/L (ref 0–40)
Albumin/Globulin Ratio: 1.1 — ABNORMAL LOW (ref 1.2–2.2)
Albumin: 3.6 g/dL — ABNORMAL LOW (ref 3.7–4.7)
Alkaline Phosphatase: 118 IU/L — ABNORMAL HIGH (ref 39–117)
BUN/Creatinine Ratio: 10 — ABNORMAL LOW (ref 12–28)
BUN: 10 mg/dL (ref 8–27)
Bilirubin Total: 0.3 mg/dL (ref 0.0–1.2)
CALCIUM: 9.2 mg/dL (ref 8.7–10.3)
CHLORIDE: 102 mmol/L (ref 96–106)
CO2: 23 mmol/L (ref 20–29)
Creatinine, Ser: 0.98 mg/dL (ref 0.57–1.00)
GFR calc Af Amer: 67 mL/min/{1.73_m2} (ref >59)
GFR calc non Af Amer: 58 mL/min/{1.73_m2} — ABNORMAL LOW (ref >59)
Globulin, Total: 3.2 g/dL (ref 1.5–4.5)
Glucose: 96 mg/dL (ref 65–99)
Potassium: 4 mmol/L (ref 3.5–5.2)
Sodium: 142 mmol/L (ref 134–144)
Total Protein: 6.8 g/dL (ref 6.0–8.5)

## 2018-05-22 LAB — CBC WITH DIFFERENTIAL/PLATELET
Basophils Absolute: 0.1 10*3/uL (ref 0.0–0.2)
Basos: 1 %
EOS (ABSOLUTE): 0.3 10*3/uL (ref 0.0–0.4)
Eos: 4 %
Hematocrit: 39.4 % (ref 34.0–46.6)
Hemoglobin: 12.5 g/dL (ref 11.1–15.9)
IMMATURE GRANULOCYTES: 1 %
Immature Grans (Abs): 0.1 10*3/uL (ref 0.0–0.1)
Lymphocytes Absolute: 1.7 10*3/uL (ref 0.7–3.1)
Lymphs: 21 %
MCH: 26.9 pg (ref 26.6–33.0)
MCHC: 31.7 g/dL (ref 31.5–35.7)
MCV: 85 fL (ref 79–97)
Monocytes Absolute: 1.2 10*3/uL — ABNORMAL HIGH (ref 0.1–0.9)
Monocytes: 14 %
NEUTROS PCT: 59 %
Neutrophils Absolute: 5.1 10*3/uL (ref 1.4–7.0)
Platelets: 437 10*3/uL (ref 150–450)
RBC: 4.64 x10E6/uL (ref 3.77–5.28)
RDW: 14.5 % (ref 11.7–15.4)
WBC: 8.4 10*3/uL (ref 3.4–10.8)

## 2018-05-22 NOTE — Progress Notes (Signed)
Subjective:    Patient ID: [name], female    DOB: November 04, 1945, 73 y.o.   MRN: [id number]  Chief Complaint  Patient presents with  . Hypertension   PT presents to the office today with frequent falls. States over the last 6 months she has fallen about 8 times. States she usually falls in the evening/night.   She was seen a few months ago and we decreased her Lyric as has was over medicated. However, she states her pain is increased and is a 10 out 10. She has a stimulator that she uses that helps.   Pt is followed by Pain [facility].  Hypertension  This is a chronic problem. The current episode started more than 1 year ago. The problem has been waxing and waning since onset. The problem is uncontrolled. Associated symptoms include malaise/fatigue. Pertinent negatives include no headaches, peripheral edema or shortness of breath. The current treatment provides moderate improvement. There is no history of kidney disease, CAD/MI, CVA or heart failure.      Review of Systems  Constitutional: Positive for malaise/fatigue.  Respiratory: Negative for shortness of breath.   Musculoskeletal: Positive for arthralgias and back pain.  Neurological: Negative for headaches.  All other systems reviewed and are negative.      Objective:   Physical Exam Vitals signs reviewed.  Constitutional:      General: She is not in acute distress.    Appearance: She is well-developed.  HENT:     Head: Normocephalic and atraumatic.  Eyes:     Pupils: Pupils are equal, round, and reactive to light.  Neck:     Musculoskeletal: Normal range of motion and neck supple.     Thyroid: No thyromegaly.  Cardiovascular:     Rate and Rhythm: Normal rate and regular rhythm.     Heart sounds: Normal heart sounds. No murmur.  Pulmonary:     Effort: Pulmonary effort is normal. No respiratory distress.     Breath sounds: Normal breath sounds. No wheezing.  Abdominal:     General: Bowel sounds are normal.  There is no distension.     Palpations: Abdomen is soft.     Tenderness: There is no abdominal tenderness.  Musculoskeletal:        General: No tenderness.     Comments: Pain in lumbar with flexion and extension  Skin:    General: Skin is warm and dry.  Neurological:     Mental Status: She is alert and oriented to person, place, and time.     Cranial Nerves: No cranial nerve deficit.     Gait: Gait abnormal.     Deep Tendon Reflexes: Reflexes are normal and symmetric.  Psychiatric:        Behavior: Behavior normal.        Thought Content: Thought content normal.        Judgment: Judgment normal.     BP 138/74   Pulse 90   Temp 98.5 F (36.9 C) (Oral)   Ht 5' 7"  (1.702 m)   Wt 165 lb 9.6 oz (75.1 kg)   BMI 25.94 kg/m      Assessment & Plan:  [name] comes in today with chief complaint of Hypertension   Diagnosis and orders addressed:  1. Other symptoms and signs involving the nervous system  - CT HEAD W & WO CONTRAST; Future - CMP14+EGFR - CBC with Differential/Platelet  2. Frequent falls - CT HEAD W & WO CONTRAST; Future - CMP14+EGFR -  CBC with Differential/Platelet  3. Impairment of balance - CT HEAD W & WO CONTRAST; Future - CMP14+EGFR - CBC with Differential/Platelet  4. Essential hypertension  5. Lumbar radiculopathy  6. 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 TOBACCO ABUSE  8. Chronic bilateral low back pain with bilateral sciatica  9. S/P lumbar spinal fusion  10. S/P lumbar fusion  Given frequent falls and BP normal today we will order CT head Keep follow up with Pain Clinic  Will not increase lyric today since she is falling Labs pending   Follow up plan: 1 month  Danielle Dun, [name]

## 2018-05-22 NOTE — Patient Instructions (Signed)
Fall Prevention in the Home, Adult  Falls can cause injuries. They can happen to people of all ages. There are many things you can do to make your home safe and to help prevent falls. Ask for help when making these changes, if needed.  What actions can I take to prevent falls?  General Instructions  · Use good lighting in all rooms. Replace any light bulbs that burn out.  · Turn on the lights when you go into a dark area. Use night-lights.  · Keep items that you use often in easy-to-reach places. Lower the shelves around your home if necessary.  · Set up your furniture so you have a clear path. Avoid moving your furniture around.  · Do not have throw rugs and other things on the floor that can make you trip.  · Avoid walking on wet floors.  · If any of your floors are uneven, fix them.  · Add color or contrast paint or tape to clearly mark and help you see:  ? Any grab bars or handrails.  ? First and last steps of stairways.  ? Where the edge of each step is.  · If you use a stepladder:  ? Make sure that it is fully opened. Do not climb a closed stepladder.  ? Make sure that both sides of the stepladder are locked into place.  ? Ask someone to hold the stepladder for you while you use it.  · If there are any pets around you, be aware of where they are.  What can I do in the bathroom?         · Keep the floor dry. Clean up any water that spills onto the floor as soon as it happens.  · Remove soap buildup in the tub or shower regularly.  · Use non-skid mats or decals on the floor of the tub or shower.  · Attach bath mats securely with double-sided, non-slip rug tape.  · If you need to sit down in the shower, use a plastic, non-slip stool.  · Install grab bars by the toilet and in the tub and shower. Do not use towel bars as grab bars.  What can I do in the bedroom?  · Make sure that you have a light by your bed that is easy to reach.  · Do not use any sheets or blankets that are too big for your bed. They should  not hang down onto the floor.  · Have a firm chair that has side arms. You can use this for support while you get dressed.  What can I do in the kitchen?  · Clean up any spills right away.  · If you need to reach something above you, use a strong step stool that has a grab bar.  · Keep electrical cords out of the way.  · Do not use floor polish or wax that makes floors slippery. If you must use wax, use non-skid floor wax.  What can I do with my stairs?  · Do not leave any items on the stairs.  · Make sure that you have a light switch at the top of the stairs and the bottom of the stairs. If you do not have them, ask someone to add them for you.  · Make sure that there are handrails on both sides of the stairs, and use them. Fix handrails that are broken or loose. Make sure that handrails are as long as the stairways.  ·   Install non-slip stair treads on all stairs in your home.  · Avoid having throw rugs at the top or bottom of the stairs. If you do have throw rugs, attach them to the floor with carpet tape.  · Choose a carpet that does not hide the edge of the steps on the stairway.  · Check any carpeting to make sure that it is firmly attached to the stairs. Fix any carpet that is loose or worn.  What can I do on the outside of my home?  · Use bright outdoor lighting.  · Regularly fix the edges of walkways and driveways and fix any cracks.  · Remove anything that might make you trip as you walk through a door, such as a raised step or threshold.  · Trim any bushes or trees on the path to your home.  · Regularly check to see if handrails are loose or broken. Make sure that both sides of any steps have handrails.  · Install guardrails along the edges of any raised decks and porches.  · Clear walking paths of anything that might make someone trip, such as tools or rocks.  · Have any leaves, snow, or ice cleared regularly.  · Use sand or salt on walking paths during winter.  · Clean up any spills in your garage right  away. This includes grease or oil spills.  What other actions can I take?  · Wear shoes that:  ? Have a low heel. Do not wear high heels.  ? Have rubber bottoms.  ? Are comfortable and fit you well.  ? Are closed at the toe. Do not wear open-toe sandals.  · Use tools that help you move around (mobility aids) if they are needed. These include:  ? Canes.  ? Walkers.  ? Scooters.  ? Crutches.  · Review your medicines with your doctor. Some medicines can make you feel dizzy. This can increase your chance of falling.  Ask your doctor what other things you can do to help prevent falls.  Where to find more information  · Centers for Disease Control and Prevention, [employer]: https://cdc.gov  · National Institute on Aging: https://go4life.nia.nih.gov  Contact a doctor if:  · You are afraid of falling at home.  · You feel weak, drowsy, or dizzy at home.  · You fall at home.  Summary  · There are many simple things that you can do to make your home safe and to help prevent falls.  · Ways to make your home safe include removing tripping hazards and installing grab bars in the bathroom.  · Ask for help when making these changes in your home.  This information is not intended to replace advice given to you by your health care provider. Make sure you discuss any questions you have with your health care provider.  Document Released: 01/12/2009 Document Revised: 10/31/2016 Document Reviewed: 10/31/2016  [employer]vier Interactive Patient Education © 2019 [employer]

## 2018-05-27 ENCOUNTER — Emergency Department (HOSPITAL_COMMUNITY): Payer: Medicare Other

## 2018-05-27 ENCOUNTER — Encounter (HOSPITAL_COMMUNITY): Payer: Self-pay

## 2018-05-27 ENCOUNTER — Inpatient Hospital Stay (HOSPITAL_COMMUNITY)
Admission: EM | Admit: 2018-05-27 | Discharge: 2018-06-08 | DRG: 252 | Disposition: A | Discharge status: Skilled Nursing Facility | Payer: Medicare Other | Attending: Nephrology | Admitting: Nephrology

## 2018-05-27 ENCOUNTER — Other Ambulatory Visit: Payer: Self-pay

## 2018-05-27 DIAGNOSIS — N170 Acute kidney failure with tubular necrosis: Secondary | ICD-10-CM | POA: Diagnosis not present

## 2018-05-27 DIAGNOSIS — Z981 Arthrodesis status: Secondary | ICD-10-CM

## 2018-05-27 DIAGNOSIS — R52 Pain, unspecified: Secondary | ICD-10-CM

## 2018-05-27 DIAGNOSIS — E785 Hyperlipidemia, unspecified: Secondary | ICD-10-CM | POA: Diagnosis present

## 2018-05-27 DIAGNOSIS — M1712 Unilateral primary osteoarthritis, left knee: Secondary | ICD-10-CM | POA: Diagnosis present

## 2018-05-27 DIAGNOSIS — R296 Repeated falls: Secondary | ICD-10-CM | POA: Diagnosis present

## 2018-05-27 DIAGNOSIS — F329 Major depressive disorder, single episode, unspecified: Secondary | ICD-10-CM | POA: Diagnosis present

## 2018-05-27 DIAGNOSIS — E7800 Pure hypercholesterolemia, unspecified: Secondary | ICD-10-CM | POA: Diagnosis present

## 2018-05-27 DIAGNOSIS — F17200 Nicotine dependence, unspecified, uncomplicated: Secondary | ICD-10-CM | POA: Diagnosis present

## 2018-05-27 DIAGNOSIS — R531 Weakness: Secondary | ICD-10-CM | POA: Diagnosis not present

## 2018-05-27 DIAGNOSIS — K219 Gastro-esophageal reflux disease without esophagitis: Secondary | ICD-10-CM | POA: Diagnosis present

## 2018-05-27 DIAGNOSIS — I739 Peripheral vascular disease, unspecified: Secondary | ICD-10-CM | POA: Diagnosis present

## 2018-05-27 DIAGNOSIS — M5441 Lumbago with sciatica, right side: Secondary | ICD-10-CM

## 2018-05-27 DIAGNOSIS — I743 Embolism and thrombosis of arteries of the lower extremities: Principal | ICD-10-CM | POA: Diagnosis present

## 2018-05-27 DIAGNOSIS — D62 Acute posthemorrhagic anemia: Secondary | ICD-10-CM | POA: Diagnosis not present

## 2018-05-27 DIAGNOSIS — W19XXXA Unspecified fall, initial encounter: Secondary | ICD-10-CM | POA: Diagnosis present

## 2018-05-27 DIAGNOSIS — Z8249 Family history of ischemic heart disease and other diseases of the circulatory system: Secondary | ICD-10-CM

## 2018-05-27 DIAGNOSIS — K8689 Other specified diseases of pancreas: Secondary | ICD-10-CM

## 2018-05-27 DIAGNOSIS — I745 Embolism and thrombosis of iliac artery: Secondary | ICD-10-CM | POA: Diagnosis present

## 2018-05-27 DIAGNOSIS — R05 Cough: Secondary | ICD-10-CM

## 2018-05-27 DIAGNOSIS — R55 Syncope and collapse: Secondary | ICD-10-CM

## 2018-05-27 DIAGNOSIS — J441 Chronic obstructive pulmonary disease with (acute) exacerbation: Secondary | ICD-10-CM

## 2018-05-27 DIAGNOSIS — Z791 Long term (current) use of non-steroidal anti-inflammatories (NSAID): Secondary | ICD-10-CM

## 2018-05-27 DIAGNOSIS — I998 Other disorder of circulatory system: Secondary | ICD-10-CM | POA: Diagnosis present

## 2018-05-27 DIAGNOSIS — N390 Urinary tract infection, site not specified: Secondary | ICD-10-CM

## 2018-05-27 DIAGNOSIS — R059 Cough, unspecified: Secondary | ICD-10-CM

## 2018-05-27 DIAGNOSIS — S7001XA Contusion of right hip, initial encounter: Secondary | ICD-10-CM

## 2018-05-27 DIAGNOSIS — I10 Essential (primary) hypertension: Secondary | ICD-10-CM | POA: Diagnosis present

## 2018-05-27 DIAGNOSIS — M5442 Lumbago with sciatica, left side: Secondary | ICD-10-CM

## 2018-05-27 DIAGNOSIS — F17210 Nicotine dependence, cigarettes, uncomplicated: Secondary | ICD-10-CM | POA: Diagnosis present

## 2018-05-27 DIAGNOSIS — F32A Depression, unspecified: Secondary | ICD-10-CM | POA: Diagnosis present

## 2018-05-27 DIAGNOSIS — G8929 Other chronic pain: Secondary | ICD-10-CM

## 2018-05-27 DIAGNOSIS — W07XXXA Fall from chair, initial encounter: Secondary | ICD-10-CM | POA: Diagnosis present

## 2018-05-27 DIAGNOSIS — M5126 Other intervertebral disc displacement, lumbar region: Secondary | ICD-10-CM | POA: Diagnosis present

## 2018-05-27 DIAGNOSIS — K859 Acute pancreatitis without necrosis or infection, unspecified: Secondary | ICD-10-CM | POA: Diagnosis present

## 2018-05-27 DIAGNOSIS — Z79899 Other long term (current) drug therapy: Secondary | ICD-10-CM

## 2018-05-27 LAB — COMPREHENSIVE METABOLIC PANEL
ALBUMIN: 3.2 g/dL — AB (ref 3.5–5.0)
ALT: 10 U/L (ref 0–44)
AST: 22 U/L (ref 15–41)
Alkaline Phosphatase: 99 U/L (ref 38–126)
Anion gap: 10 (ref 5–15)
BUN: 14 mg/dL (ref 8–23)
CO2: 23 mmol/L (ref 22–32)
CREATININE: 1.08 mg/dL — AB (ref 0.44–1.00)
Calcium: 8.8 mg/dL — ABNORMAL LOW (ref 8.9–10.3)
Chloride: 103 mmol/L (ref 98–111)
GFR calc Af Amer: 60 mL/min — ABNORMAL LOW (ref >60)
GFR calc non Af Amer: 52 mL/min — ABNORMAL LOW (ref >60)
Glucose, Bld: 132 mg/dL — ABNORMAL HIGH (ref 70–99)
Potassium: 3.5 mmol/L (ref 3.5–5.1)
Sodium: 136 mmol/L (ref 135–145)
Total Bilirubin: 0.5 mg/dL (ref 0.3–1.2)
Total Protein: 7.4 g/dL (ref 6.5–8.1)

## 2018-05-27 LAB — CBC WITH DIFFERENTIAL/PLATELET
ABS IMMATURE GRANULOCYTES: 0.19 10*3/uL — AB (ref 0.00–0.07)
Basophils Absolute: 0.1 10*3/uL (ref 0.0–0.1)
Basophils Relative: 1 %
Eosinophils Absolute: 0.1 10*3/uL (ref 0.0–0.5)
Eosinophils Relative: 1 %
HCT: 39.8 % (ref 36.0–46.0)
Hemoglobin: 11.9 g/dL — ABNORMAL LOW (ref 12.0–15.0)
Immature Granulocytes: 2 %
Lymphocytes Relative: 11 %
Lymphs Abs: 1.3 10*3/uL (ref 0.7–4.0)
MCH: 25.7 pg — ABNORMAL LOW (ref 26.0–34.0)
MCHC: 29.9 g/dL — ABNORMAL LOW (ref 30.0–36.0)
MCV: 86 fL (ref 80.0–100.0)
Monocytes Absolute: 1.1 10*3/uL — ABNORMAL HIGH (ref 0.1–1.0)
Monocytes Relative: 9 %
NEUTROS ABS: 9.6 10*3/uL — AB (ref 1.7–7.7)
Neutrophils Relative %: 76 %
Platelets: 342 10*3/uL (ref 150–400)
RBC: 4.63 MIL/uL (ref 3.87–5.11)
RDW: 15.4 % (ref 11.5–15.5)
WBC: 12.3 10*3/uL — ABNORMAL HIGH (ref 4.0–10.5)
nRBC: 0 % (ref 0.0–0.2)

## 2018-05-27 LAB — URINALYSIS, ROUTINE W REFLEX MICROSCOPIC
Bilirubin Urine: NEGATIVE
Glucose, UA: NEGATIVE mg/dL
Ketones, ur: NEGATIVE mg/dL
Leukocytes,Ua: NEGATIVE
Nitrite: NEGATIVE
PROTEIN: NEGATIVE mg/dL
Specific Gravity, Urine: 1.008 (ref 1.005–1.030)
pH: 5 (ref 5.0–8.0)

## 2018-05-27 LAB — TROPONIN I: Troponin I: <0.03 ng/mL (ref <0.03)

## 2018-05-27 MED ORDER — FENTANYL CITRATE (PF) 100 MCG/2ML IJ SOLN
50.0000 ug | Freq: Once | INTRAMUSCULAR | Status: AC
  Administered 2018-05-27: 50 ug via INTRAVENOUS
  Filled 2018-05-27: qty 2

## 2018-05-27 MED ORDER — IOPAMIDOL (ISOVUE-370) INJECTION 76%
150.0000 mL | Freq: Once | INTRAVENOUS | Status: AC | PRN
  Administered 2018-05-27: 150 mL via INTRAVENOUS

## 2018-05-27 MED ORDER — CEPHALEXIN 500 MG PO CAPS: 500.0000 mg | ORAL_CAPSULE | Freq: Four times a day (QID) | ORAL | 0 refills | Status: DC

## 2018-05-27 NOTE — ED Triage Notes (Addendum)
Pt presents to ED with complaints of bilateral leg pain after falling out of chair. Pt denies LOC or hitting head. Pt complaining of left hip pain.

## 2018-05-27 NOTE — ED Notes (Signed)
States she has been dizzy for some time, family is concerned she does not take care of herself

## 2018-05-27 NOTE — ED Notes (Signed)
Patient's family called out stating that they have a concern about patient's foot have some discoloration to top of foot to big toe on left foot. PA Langston Masker made aware at this time.

## 2018-05-27 NOTE — ED Provider Notes (Signed)
[facility] EMERGENCY DEPARTMENT Provider Note   CSN: [id number] Arrival date & time: 05/27/18  1508    History   Chief Complaint Chief Complaint  Patient presents with  . Fall    HPI [name] is a 73 y.o. female.     The history is provided by the patient and a relative. No language interpreter was used.  Fall  This is a new problem. The problem occurs constantly. Nothing aggravates the symptoms. Nothing relieves the symptoms. She has tried nothing for the symptoms. The treatment provided no relief.  Pt here visiting family member.  Pt reports she was sitting in a chair in the room and her legs became weak.  Pt reports she slid out of chair onto floor with standing.  Pt complains of pain in her right hip.  Pt reports she did not hit her head no loss of consciousness.  Family here with pt. They report pt has been having frequent falls.  Primary care is planning ct of pt's head.  They are concerned pt has heart disease.  Pt's daughter request a troponin to evaluate.   Past Medical History:  Diagnosis Date  . Chronic back pain   . GERD (gastroesophageal reflux disease)   . Hypercholesterolemia   . Hyperlipidemia   . Hypertension   . Osteoarthritis of left knee    End-stage  . Palpitations   . PVD (peripheral vascular disease) (HCC)   . Tobacco user     Patient Active Problem List   Diagnosis Date Noted  . Lumbar radiculopathy 07/30/2016  . Depression 07/24/2015  . S/P lumbar fusion 01/27/2015  . GAD (generalized anxiety disorder) 06/13/2014  . Neck pain 07/14/2012  . S/P lumbar spinal fusion 07/14/2012  . Lumbar stenosis with neurogenic claudication 06/11/2012  . DDD (degenerative disc disease), lumbosacral 04/24/2012  . Spondylolisthesis at L5-S1 level 04/24/2012  . Hyperlipidemia with target LDL less than 100 06/16/2009  . TOBACCO ABUSE 06/16/2009  . Essential hypertension 06/16/2009  . Peripheral vascular disease (HCC) 06/16/2009  . GERD 06/16/2009  .  Chronic back pain 06/16/2009  . CAROTID BRUIT 05/23/2009    Past Surgical History:  Procedure Laterality Date  . Angiogram-type unspecified  06/13/00   Dr. Hart [place]  . FEMORAL-POPLITEAL BYPASS GRAFT    . FINGER TENDON REPAIR  1988   Right mallet  . KNEE ARTHROSCOPY  1996   Left  . LUMBAR FUSION  1998   Dr. Jacqulyn Bath     OB History   No obstetric history on file.      Home Medications    Prior to Admission medications   Medication Sig Start Date End Date Taking? Authorizing Provider  atorvastatin (LIPITOR) 40 MG tablet Take 1 tablet (40 mg total) by mouth daily. 10/23/17   Daphine Deutscher, Mary-Margaret, [name]  cephALEXin (KEFLEX) 500 MG capsule Take 1 capsule (500 mg total) by mouth 4 (four) times daily for 10 days. 05/27/18 06/06/18  Elson Areas, PA-C  DULoxetine (CYMBALTA) 60 MG capsule Take 1 capsule (60 mg total) by mouth daily. 10/23/17   Bennie Pierini, [name]  fenofibrate 160 MG tablet TAKE 1 TABLET BY MOUTH ONCE DAILY 11/25/17   Daphine Deutscher, Mary-Margaret, [name]  lisinopril (PRINIVIL,ZESTRIL) 20 MG tablet Take 1 tablet (20 mg total) by mouth daily. 04/24/18   Junie Spencer, [name]  meloxicam (MOBIC) 15 MG tablet Take 1 tablet (15 mg total) by mouth daily. 09/02/16   Daphine Deutscher Mary-Margaret, [name]  omeprazole (PRILOSEC) 20 MG capsule Take 1 capsule (20  mg total) by mouth daily. 03/05/18   Junie Spencer, [name]  pregabalin (LYRICA) 75 MG capsule Take 1 capsule (75 mg total) by mouth 3 (three) times daily. 05/01/18   Junie Spencer, [name]  tiZANidine (ZANAFLEX) 4 MG tablet Take 1-2 tablets (4-8 mg total) by mouth every 8 (eight) hours as needed for muscle spasms. 09/02/16   Bennie Pierini, [name]    Family History Family History  Problem Relation Age of Onset  . Heart failure Father        CHF  . Hypertension Father   . Heart disease Father   . Hypertension Paternal Aunt   . Hypertension Paternal Uncle   . Hypertension Paternal Grandmother   . Hypertension Paternal Grandfather   . Coronary  artery disease Neg Hx        Premature    Social History Social History   Tobacco Use  . Smoking status: Current Every Day Smoker    Packs/day: 1.00    Years: 52.00    Pack years: 52.00    Types: Cigarettes  . Smokeless tobacco: Never Used  Substance Use Topics  . Alcohol use: No    Comment: Occasionally  . Drug use: No     Allergies   Patient has no known allergies.   Review of Systems Review of Systems  Musculoskeletal: Positive for myalgias. Negative for joint swelling.  All other systems reviewed and are negative.    Physical Exam Updated Vital Signs BP (!) 192/74   Pulse 78   Temp 98.3 F (36.8 C) (Oral)   Resp 20   Ht 5\' 8"  (1.727 m)   Wt 76.2 kg   SpO2 95%   BMI 25.54 kg/m   Physical Exam Vitals signs and nursing note reviewed.  Constitutional:      Appearance: She is well-developed.  HENT:     Head: Normocephalic.     Nose: Nose normal.     Mouth/Throat:     Mouth: Mucous membranes are moist.  Eyes:     Extraocular Movements: Extraocular movements intact.     Pupils: Pupils are equal, round, and reactive to light.  Neck:     Musculoskeletal: Normal range of motion.  Cardiovascular:     Rate and Rhythm: Normal rate and regular rhythm.  Pulmonary:     Effort: Pulmonary effort is normal.  Abdominal:     General: There is no distension.  Musculoskeletal: Normal range of motion.     Comments: Tender right hip,  From,  nv and ns intact   Skin:    General: Skin is warm.  Neurological:     General: No focal deficit present.     Mental Status: She is alert and oriented to person, place, and time.  Psychiatric:        Mood and Affect: Mood normal.      ED Treatments / Results  Labs (all labs ordered are listed, but only abnormal results are displayed) Labs Reviewed  CBC WITH DIFFERENTIAL/PLATELET - Abnormal; Notable for the following components:      Result Value   WBC 12.3 (*)    Hemoglobin 11.9 (*)    MCH 25.7 (*)    MCHC 29.9 (*)     Neutro Abs 9.6 (*)    Monocytes Absolute 1.1 (*)    Abs Immature Granulocytes 0.19 (*)    All other components within normal limits  COMPREHENSIVE METABOLIC PANEL - Abnormal; Notable for the following components:   Glucose, Bld 132 (*)  Creatinine, Ser 1.08 (*)    Calcium 8.8 (*)    Albumin 3.2 (*)    GFR calc non Af Amer 52 (*)    GFR calc Af Amer 60 (*)    All other components within normal limits  URINALYSIS, ROUTINE W REFLEX MICROSCOPIC - Abnormal; Notable for the following components:   APPearance CLOUDY (*)    Hgb urine dipstick SMALL (*)    Bacteria, UA MANY (*)    All other components within normal limits  TROPONIN I    EKG None  Radiology Ct Head Wo Contrast  Result Date: 05/27/2018 CLINICAL DATA:  Multiple falls.  Dizziness. EXAM: CT HEAD WITHOUT CONTRAST TECHNIQUE: Contiguous axial images were obtained from the base of the skull through the vertex without intravenous contrast. COMPARISON:  None. FINDINGS: Brain: Mild age related volume loss. No acute intracranial abnormality. Specifically, no hemorrhage, hydrocephalus, mass lesion, acute infarction, or significant intracranial injury. Vascular: No hyperdense vessel or unexpected calcification. Skull: No acute calvarial abnormality. Sinuses/Orbits: Rounded soft tissue in the left sphenoid sinus extending into the posterior nasal cavity. Other: None IMPRESSION: No acute intracranial abnormality. Soft tissue opacification of the left sphenoid sinus with rounded soft tissue extending into the posterior left nasal cavity. This presumably is related to chronic sinusitis changes and is thickening, but recommend direct visualization to exclude other soft tissue mass. Electronically Signed   By: Charlett Nose M.D.   On: 05/27/2018 19:24   Dg Hip Unilat With Pelvis 2-3 Views Right  Result Date: 05/27/2018 CLINICAL DATA:  Initial evaluation for acute hip pain status post fall. EXAM: DG HIP (WITH OR WITHOUT PELVIS) 2-3V RIGHT  COMPARISON:  None available. FINDINGS: No acute fracture or dislocation. Femoral heads in normal alignment within the acetabula. Femoral head heights maintained. Bony pelvis intact. Mild osteoarthritic changes about the hips bilaterally. Postoperative changes present within the lower lumbar spine. Generator and locked roots for spinal stimulator device partially visualize within the right lower quadrant. No acute soft tissue abnormality. Surgical clips overlie the inguinal regions bilaterally. IMPRESSION: 1. No acute osseous abnormality about the right hip. 2. Postoperative changes within the lower lumbar spine with spinal stimulator device in place. Electronically Signed   By: Rise Mu M.D.   On: 05/27/2018 16:07    Procedures Procedures (including critical care time)  Medications Ordered in ED Medications  fentaNYL (SUBLIMAZE) injection 50 mcg (50 mcg Intravenous Given 05/27/18 2142)  iopamidol (ISOVUE-370) 76 % injection 150 mL (150 mLs Intravenous Contrast Given 05/27/18 2227)     Initial Impression / Assessment and Plan / ED Course  I have reviewed the triage vital signs and the nursing notes.  Pertinent labs & imaging results that were available during my care of the patient were reviewed by me and considered in my medical decision making (see chart for details).        MDM  Ekg no acute abnormality,  Ct head no acute labs normal.  Urine shows uti.   Family reports pt's toes on her left  foot are discolored. Dr. Adriana Simas in to see.   Pt has pale left toes,  Erythema upper legs and feet to area just above toes. Heat pack applied with some improvement.  Ct angio ao and bifem ordered.  Pt noted to have multiple abnormalities,  Pancreas shows probable cyst.    I spoke with Dr. Arbie Cookey with Vascular.  He advised pt has changes.  He advised have pt call to schedule appointment to see him.  He is here in [place] on Mondays.  I spoke with Dr. Robb Matar who will see pt for admission    Final Clinical Impressions(s) / ED Diagnoses   Final diagnoses:  Fall, initial encounter  Contusion of right hip, initial encounter  Urinary tract infection without hematuria, site unspecified  Acute pancreatitis, unspecified complication status, unspecified pancreatitis type  Pancreatic mass  Hypertension, unspecified type       Elson Areas, PA-C 05/28/18 0102    Donnetta Hutching, MD 05/29/18 [phone]

## 2018-05-27 NOTE — ED Notes (Signed)
Refilled ice packs for pt.

## 2018-05-27 NOTE — Discharge Instructions (Addendum)
Peripheral Vascular Disease  Peripheral vascular disease (PVD) is a disease of the blood vessels that are not part of your heart and brain. A simple term for PVD is poor circulation. In most cases, PVD narrows the blood vessels that carry blood from your heart to the rest of your body. This can reduce the supply of blood to your arms, legs, and internal organs, like your stomach or kidneys. However, PVD most often affects a persons lower legs and feet. Without treatment, PVD tends to get worse. PVD can also lead to acute ischemic limb. This is when an arm or leg suddenly cannot get enough blood. This is a medical emergency. Follow these instructions at home: Lifestyle  Do not use any products that contain nicotine or tobacco, such as cigarettes and e-cigarettes. If you need help quitting, ask your doctor.  Lose weight if you are overweight. Or, stay at a healthy weight as told by your doctor.  Eat a diet that is low in fat and cholesterol. If you need help, ask your doctor.  Exercise regularly. Ask your doctor for activities that are right for you. General instructions  Take over-the-counter and prescription medicines only as told by your doctor.  Take good care of your feet: ? Wear comfortable shoes that fit well. ? Check your feet often for any cuts or sores.  Keep all follow-up visits as told by your doctor This is important. Contact a doctor if:  You have cramps in your legs when you walk.  You have leg pain when you are at rest.  You have coldness in a leg or foot.  Your skin changes.  You are unable to get or have an erection (erectile dysfunction).  You have cuts or sores on your feet that do not heal. Get help right away if:  Your arm or leg turns cold, numb, and blue.  Your arms or legs become red, warm, swollen, painful, or numb.  You have chest pain.  You have trouble breathing.  You suddenly have weakness in your face, arm, or leg.  You become very  confused or you cannot speak.  You suddenly have a very bad headache.  You suddenly cannot see. Summary  Peripheral vascular disease (PVD) is a disease of the blood vessels.  A simple term for PVD is poor circulation. Without treatment, PVD tends to get worse.  Treatment may include exercise, low fat and low cholesterol diet, and quitting smoking. This information is not intended to replace advice given to you by your health care provider. Make sure you discuss any questions you have with your health care provider. Document Released: 06/12/2009 Document Revised: 04/25/2016 Document Reviewed: 04/25/2016 Elsevier Interactive Patient Education  2019 [employer]   Neuropathic Pain Neuropathic pain is pain caused by damage to the nerves that are responsible for certain sensations in your body (sensory nerves). The pain can be caused by:  Damage to the sensory nerves that send signals to your spinal cord and brain (peripheral nervous system).  Damage to the sensory nerves in your brain or spinal cord (central nervous system). Neuropathic pain can make you more sensitive to pain. Even a minor sensation can feel very painful. This is usually a long-term condition that can be difficult to treat. The type of pain differs from person to person. It may:  Start suddenly (acute), or it may develop slowly and last for a long time (chronic).  Come and go as damaged nerves heal, or it may stay at  the same level for years.  Cause emotional distress, loss of sleep, and a lower quality of life. What are the causes? The most common cause of this condition is diabetes. Many other diseases and conditions can also cause neuropathic pain. Causes of neuropathic pain can be classified as:  Toxic. This is caused by medicines and chemicals. The most common cause of toxic neuropathic pain is damage from cancer treatments (chemotherapy).  Metabolic. This can be caused by: ? Diabetes. This is the most common  disease that damages the nerves. ? Lack of vitamin B from long-term alcohol abuse.  Traumatic. Any injury that cuts, crushes, or stretches a nerve can cause damage and pain. A common example is feeling pain after losing an arm or leg (phantom limb pain).  Compression-related. If a sensory nerve gets trapped or compressed for a long period of time, the blood supply to the nerve can be cut off.  Vascular. Many blood vessel diseases can cause neuropathic pain by decreasing blood supply and oxygen to nerves.  Autoimmune. This type of pain results from diseases in which the body's defense system (immune system) mistakenly attacks sensory nerves. Examples of autoimmune diseases that can cause neuropathic pain include lupus and multiple sclerosis.  Infectious. Many types of viral infections can damage sensory nerves and cause pain. Shingles infection is a common cause of this type of pain.  Inherited. Neuropathic pain can be a symptom of many diseases that are passed down through families (genetic). What increases the risk? You are more likely to develop this condition if:  You have diabetes.  You smoke.  You drink too much alcohol.  You are taking certain medicines, including medicines that kill cancer cells (chemotherapy) or that treat immune system disorders. What are the signs or symptoms? The main symptom is pain. Neuropathic pain is often described as:  Burning.  Shock-like.  Stinging.  Hot or cold.  Itching. How is this diagnosed? No single test can diagnose neuropathic pain. It is diagnosed based on:  Physical exam and your symptoms. Your health care provider will ask you about your pain. You may be asked to use a pain scale to describe how bad your pain is.  Tests. These may be done to see if you have a high sensitivity to pain and to help find the cause and location of any sensory nerve damage. They include: ? Nerve conduction studies to test how well nerve signals travel  through your sensory nerves (electrodiagnostic testing). ? Stimulating your sensory nerves through electrodes on your skin and measuring the response in your spinal cord and brain (somatosensory evoked potential).  Imaging studies, such as: ? X-rays. ? CT scan. ? MRI. How is this treated? Treatment for neuropathic pain may change over time. You may need to try different treatment options or a combination of treatments. Some options include:  Treating the underlying cause of the neuropathy, such as diabetes, kidney disease, or vitamin deficiencies.  Stopping medicines that can cause neuropathy, such as chemotherapy.  Medicine to relieve pain. Medicines may include: ? Prescription or over-the-counter pain medicine. ? Anti-seizure medicine. ? Antidepressant medicines. ? Pain-relieving patches that are applied to painful areas of skin. ? A medicine to numb the area (local anesthetic), which can be injected as a nerve block.  Transcutaneous nerve stimulation. This uses electrical currents to block painful nerve signals. The treatment is painless.  Alternative treatments, such as: ? Acupuncture. ? Meditation. ? Massage. ? Physical therapy. ? Pain management programs. ? Counseling. Follow  these instructions at home: Medicines   Take over-the-counter and prescription medicines only as told by your health care provider.  Do not drive or use heavy machinery while taking prescription pain medicine.  If you are taking prescription pain medicine, take actions to prevent or treat constipation. Your health care provider may recommend that you: ? Drink enough fluid to keep your urine pale yellow. ? Eat foods that are high in fiber, such as fresh fruits and vegetables, whole grains, and beans. ? Limit foods that are high in fat and processed sugars, such as fried or sweet foods. ? Take an over-the-counter or prescription medicine for constipation. Lifestyle   Have a good support system  at home.  Consider joining a chronic pain support group.  Do not use any products that contain nicotine or tobacco, such as cigarettes and e-cigarettes. If you need help quitting, ask your health care provider.  Do not drink alcohol. General instructions  Learn as much as you can about your condition.  Work closely with all your health care providers to find the treatment plan that works best for you.  Ask your health care provider what activities are safe for you.  Keep all follow-up visits as told by your health care provider. This is important. Contact a health care provider if:  Your pain treatments are not working.  You are having side effects from your medicines.  You are struggling with tiredness (fatigue), mood changes, depression, or anxiety. Summary  Neuropathic pain is pain caused by damage to the nerves that are responsible for certain sensations in your body (sensory nerves).  Neuropathic pain may come and go as damaged nerves heal, or it may stay at the same level for years.  Neuropathic pain is usually a long-term condition that can be difficult to treat. Consider joining a chronic pain support group. This information is not intended to replace advice given to you by your health care provider. Make sure you discuss any questions you have with your health care provider. Document Released: 12/14/2003 Document Revised: 04/04/2017 Document Reviewed: 04/04/2017 Elsevier Interactive Patient Education  2019 [employer]. See your Physician for recheck.  Return if any problems.      [facility]  Discharge instructions  Lower Extremity Bypass Surgery  Please refer to the following instruction for your post-procedure care. Your surgeon or physician assistant will discuss any changes with you.  Activity  You are encouraged to walk as much as you can. You can slowly return to normal activities during the month after your surgery.  Avoid strenuous activity and heavy lifting until your doctor tells you it's OK. Avoid activities such as vacuuming or swinging a golf club. Do not drive until your doctor give the OK and you are no longer taking prescription pain medications. It is also normal to have difficulty with sleep habits, eating and bowel movement after surgery. These will go away with time.  Bathing/Showering  Shower daily after you go home. Do not soak in a bathtub, hot tub, or swim until the incision heals completely.  Incision Care  Clean your incision with mild soap and water. Shower every day. Pat the area dry with a clean towel. You do not need a bandage unless otherwise instructed. Do not apply any ointments or creams to your incision. If you have open wounds you will be instructed how to care for them or a visiting nurse may be arranged for you. If you have staples or sutures along  your incision they will be removed at your post-op appointment. You may have skin glue on your incision. Do not peel it off. It will come off on its own in about one week.  Wash the groin wound with soap and water daily and pat dry. (No tub bath-only shower)  Then put a dry gauze or washcloth in the groin to keep this area dry to help prevent wound infection.  Do this daily and as needed.  Do not use Vaseline or neosporin on your incisions.  Only use soap and water on your incisions and then protect and keep dry.  Diet  Resume your normal diet. There are no special food restrictions following this procedure. A low fat/ low cholesterol diet is recommended for all patients with vascular disease. In order to heal from your surgery, it is CRITICAL to get adequate nutrition. Your body requires vitamins, minerals, and protein. Vegetables are the best source of vitamins and minerals. Vegetables also provide the perfect balance of protein. Processed food has little nutritional value, so try to avoid this.  Medications  Resume taking all your  medications unless your doctor or physician assistant tells you not to. If your incision is causing pain, you may take over-the-counter pain relievers such as acetaminophen (Tylenol). If you were prescribed a stronger pain medication, please aware these medication can cause nausea and constipation. Prevent nausea by taking the medication with a snack or meal. Avoid constipation by drinking plenty of fluids and eating foods with high amount of fiber, such as fruits, vegetables, and grains. Take Colace 100 mg (an over-the-counter stool softener) twice a day as needed for constipation.  Do not take Tylenol if you are taking prescription pain medications.  Follow Up  Our office will schedule a follow up appointment 2-3 weeks following discharge.  Please call us immediately for any of the following conditions  Severe or worsening pain in your legs or feet while at rest or while walking Increase pain, redness, warmth, or drainage (pus) from your incision site(s) Fever of 101 degree or higher The swelling in your leg with the bypass suddenly worsens and becomes more painful than when you were in the hospital If you have been instructed to feel your graft pulse then you should do so every day. If you can no longer feel this pulse, call the office immediately. Not all patients are given this instruction.  Leg swelling is common after leg bypass surgery.  The swelling should improve over a few months following surgery. To improve the swelling, you may elevate your legs above the level of your heart while you are sitting or resting. Your surgeon or physician assistant may ask you to apply an ACE wrap or wear compression (TED) stockings to help to reduce swelling.  Reduce your risk of vascular disease  Stop smoking. If you would like help call [facility] at 1-800-QUIT-NOW (5163345767) or [facility] at 7137183433.  Manage your cholesterol Maintain a desired weight Control your diabetes  weight Control your diabetes Keep your blood pressure down  If you have any questions, please call the office at [phone]  Information on my medicine - ELIQUIS (apixaban)  This medication education was reviewed with me or my healthcare representative as part of my discharge preparation.  The pharmacist that spoke with me during my hospital stay was:  Elwin Sleight, [facility]  Why was Eliquis prescribed for you? Eliquis was prescribed for you to reduce the risk of forming blood clots that can cause a  stroke if you have a medical condition called atrial fibrillation (a type of irregular heartbeat) OR to reduce the risk of a blood clots forming after orthopedic surgery.  What do You need to know about Eliquis ? Take your Eliquis TWICE DAILY - one tablet in the morning and one tablet in the evening with or without food.  It would be best to take the doses about the same time each day.  If you have difficulty swallowing the tablet whole please discuss with your pharmacist how to take the medication safely.  Take Eliquis exactly as prescribed by your doctor and DO NOT stop taking Eliquis without talking to the doctor who prescribed the medication.  Stopping may increase your risk of developing a new clot or stroke.  Refill your prescription before you run out.  After discharge, you should have regular check-up appointments with your healthcare provider that is prescribing your Eliquis.  In the future your dose may need to be changed if your kidney function or weight changes by a significant amount or as you get older.  What do you do if you miss a dose? If you miss a dose, take it as soon as you remember on the same day and resume taking twice daily.  Do not take more than one dose of ELIQUIS at the same time.  Important Safety Information A possible side effect of Eliquis is bleeding. You should call your healthcare provider right away if you experience any of the  following: ? Bleeding from an injury or your nose that does not stop. ? Unusual colored urine (red or dark brown) or unusual colored stools (red or black). ? Unusual bruising for unknown reasons. ? A serious fall or if you hit your head (even if there is no bleeding).  Some medicines may interact with Eliquis and might increase your risk of bleeding or clotting while on Eliquis. To help avoid this, consult your healthcare provider or pharmacist prior to using any new prescription or non-prescription medications, including herbals, vitamins, non-steroidal anti-inflammatory drugs (NSAIDs) and supplements.  This website has more information on Eliquis (apixaban): [email or web address].[email or web address].

## 2018-05-28 ENCOUNTER — Observation Stay (HOSPITAL_COMMUNITY): Payer: Medicare Other

## 2018-05-28 DIAGNOSIS — R531 Weakness: Secondary | ICD-10-CM | POA: Diagnosis not present

## 2018-05-28 DIAGNOSIS — K859 Acute pancreatitis without necrosis or infection, unspecified: Secondary | ICD-10-CM | POA: Diagnosis present

## 2018-05-28 DIAGNOSIS — N390 Urinary tract infection, site not specified: Secondary | ICD-10-CM | POA: Insufficient documentation

## 2018-05-28 DIAGNOSIS — W19XXXA Unspecified fall, initial encounter: Secondary | ICD-10-CM | POA: Diagnosis present

## 2018-05-28 LAB — CBC WITH DIFFERENTIAL/PLATELET
Abs Immature Granulocytes: 0.08 10*3/uL — ABNORMAL HIGH (ref 0.00–0.07)
Basophils Absolute: 0.1 10*3/uL (ref 0.0–0.1)
Basophils Relative: 0 %
EOS ABS: 0.2 10*3/uL (ref 0.0–0.5)
EOS PCT: 1 %
HEMATOCRIT: 38.7 % (ref 36.0–46.0)
HEMOGLOBIN: 11.6 g/dL — AB (ref 12.0–15.0)
Immature Granulocytes: 1 %
LYMPHS PCT: 20 %
Lymphs Abs: 2.8 10*3/uL (ref 0.7–4.0)
MCH: 25.6 pg — ABNORMAL LOW (ref 26.0–34.0)
MCHC: 30 g/dL (ref 30.0–36.0)
MCV: 85.4 fL (ref 80.0–100.0)
Monocytes Absolute: 1.4 10*3/uL — ABNORMAL HIGH (ref 0.1–1.0)
Monocytes Relative: 10 %
NRBC: 0 % (ref 0.0–0.2)
Neutro Abs: 9.4 10*3/uL — ABNORMAL HIGH (ref 1.7–7.7)
Neutrophils Relative %: 68 %
Platelets: 361 10*3/uL (ref 150–400)
RBC: 4.53 MIL/uL (ref 3.87–5.11)
RDW: 15.4 % (ref 11.5–15.5)
WBC: 14 10*3/uL — ABNORMAL HIGH (ref 4.0–10.5)

## 2018-05-28 LAB — COMPREHENSIVE METABOLIC PANEL
ALT: 11 U/L (ref 0–44)
AST: 33 U/L (ref 15–41)
Albumin: 3 g/dL — ABNORMAL LOW (ref 3.5–5.0)
Alkaline Phosphatase: 89 U/L (ref 38–126)
Anion gap: 8 (ref 5–15)
BUN: 11 mg/dL (ref 8–23)
CO2: 25 mmol/L (ref 22–32)
Calcium: 8.7 mg/dL — ABNORMAL LOW (ref 8.9–10.3)
Chloride: 105 mmol/L (ref 98–111)
Creatinine, Ser: 0.91 mg/dL (ref 0.44–1.00)
GFR calc Af Amer: >60 mL/min (ref >60)
GFR calc non Af Amer: >60 mL/min (ref >60)
GLUCOSE: 115 mg/dL — AB (ref 70–99)
Potassium: 4.1 mmol/L (ref 3.5–5.1)
Sodium: 138 mmol/L (ref 135–145)
Total Bilirubin: 0.2 mg/dL — ABNORMAL LOW (ref 0.3–1.2)
Total Protein: 6.8 g/dL (ref 6.5–8.1)

## 2018-05-28 LAB — LIPASE, BLOOD
LIPASE: 56 U/L — AB (ref 11–51)
Lipase: 51 U/L (ref 11–51)

## 2018-05-28 MED ORDER — ACETAMINOPHEN 325 MG PO TABS
650.0000 mg | ORAL_TABLET | Freq: Four times a day (QID) | ORAL | Status: DC | PRN
  Administered 2018-05-29 – 2018-05-30 (×3): 650 mg via ORAL
  Filled 2018-05-28 (×3): qty 2

## 2018-05-28 MED ORDER — IPRATROPIUM-ALBUTEROL 0.5-2.5 (3) MG/3ML IN SOLN: 3.0000 mL | RESPIRATORY_TRACT | Status: DC | PRN

## 2018-05-28 MED ORDER — ENOXAPARIN SODIUM 40 MG/0.4ML ~~LOC~~ SOLN
40.0000 mg | SUBCUTANEOUS | Status: DC
  Administered 2018-05-28 – 2018-05-30 (×3): 40 mg via SUBCUTANEOUS
  Filled 2018-05-28 (×3): qty 0.4

## 2018-05-28 MED ORDER — POTASSIUM CHLORIDE IN NACL 20-0.9 MEQ/L-% IV SOLN
INTRAVENOUS | Status: DC
  Administered 2018-05-28: 02:00:00 via INTRAVENOUS
  Filled 2018-05-28: qty 1000

## 2018-05-28 MED ORDER — BUDESONIDE 0.25 MG/2ML IN SUSP
0.2500 mg | Freq: Two times a day (BID) | RESPIRATORY_TRACT | Status: DC
  Administered 2018-05-28 – 2018-06-08 (×21): 0.25 mg via RESPIRATORY_TRACT
  Filled 2018-05-28 (×23): qty 2

## 2018-05-28 MED ORDER — SODIUM CHLORIDE 0.9 % IV SOLN
1.0000 g | Freq: Once | INTRAVENOUS | Status: AC
  Administered 2018-05-28: 1 g via INTRAVENOUS
  Filled 2018-05-28: qty 10

## 2018-05-28 MED ORDER — NICOTINE 21 MG/24HR TD PT24
21.0000 mg | MEDICATED_PATCH | Freq: Every day | TRANSDERMAL | Status: DC
  Administered 2018-05-29 – 2018-06-08 (×11): 21 mg via TRANSDERMAL
  Filled 2018-05-28 (×12): qty 1

## 2018-05-28 MED ORDER — ONDANSETRON HCL 4 MG PO TABS: 4.0000 mg | ORAL_TABLET | Freq: Four times a day (QID) | ORAL | Status: DC | PRN

## 2018-05-28 MED ORDER — ACETAMINOPHEN 650 MG RE SUPP: 650.0000 mg | Freq: Four times a day (QID) | RECTAL | Status: DC | PRN

## 2018-05-28 MED ORDER — ONDANSETRON HCL 4 MG/2ML IJ SOLN
4.0000 mg | Freq: Four times a day (QID) | INTRAMUSCULAR | Status: DC | PRN
  Administered 2018-05-28 – 2018-05-29 (×3): 4 mg via INTRAVENOUS
  Filled 2018-05-28 (×3): qty 2

## 2018-05-28 MED ORDER — LABETALOL HCL 5 MG/ML IV SOLN
20.0000 mg | INTRAVENOUS | Status: DC | PRN
  Administered 2018-05-28 – 2018-05-29 (×4): 20 mg via INTRAVENOUS
  Filled 2018-05-28 (×4): qty 4

## 2018-05-28 MED ORDER — BUDESONIDE 0.25 MG/2ML IN SUSP: 0.2500 mg | Freq: Two times a day (BID) | RESPIRATORY_TRACT | Status: DC

## 2018-05-28 MED ORDER — FENTANYL CITRATE (PF) 100 MCG/2ML IJ SOLN
50.0000 ug | INTRAMUSCULAR | Status: DC | PRN
  Administered 2018-05-28 – 2018-05-30 (×10): 50 ug via INTRAVENOUS
  Filled 2018-05-28 (×10): qty 2

## 2018-05-28 MED ORDER — LACTATED RINGERS IV SOLN
INTRAVENOUS | Status: DC
  Administered 2018-05-28: 12:00:00 via INTRAVENOUS
  Administered 2018-05-29: 1000 mL via INTRAVENOUS

## 2018-05-28 NOTE — Progress Notes (Signed)
[name] is a 73 y.o. female with medical history significant of chronic back pain, GERD, hyperlipidemia, hypertension, left knee osteoarthritis, palpitations, PVD, history of tobacco use who is coming to the emergency department after having a fall in 1 of the rooms of the emergency department while she was visiting her mother who was brought to the ED earlier.  She complains of bilateral leg pain after fall, but stated that she has been having pain at home as well.  She states that she has been very weak lately.  She denies fever, chills, sore throat, rhinorrhea.  However, she says she has been having wheezing and coughing.  Denies hemoptysis.  No chest pain, palpitations, dizziness or diaphoresis, PND, orthopnea or pitting edema of the lower extremities.  She complains of occasional back pain that comes around to her epigastric area.  She gets occasional nausea, but denies emesis, diarrhea, constipation, melena or hematochezia.  No dysuria, frequency or hematuria.  No polyuria, polydipsia, polyphagia or blurred vision.  05/28/18:  Seen and examined with her daughter at her bedside. Reports some discomfort at her mid abd. No nausea at this time. Admits to having intermittent nausea. No diarrhea. Last BM was 2 days ago. Has had recurrent falls.  PT to assess. Will obtain orthosthatic vital signs and carotid ultrasound.  Please refer to H&P dictated by Dr Robb Matar on 05/28/18 for further details of the assessment and plan.

## 2018-05-28 NOTE — ED Notes (Signed)
ED TO INPATIENT HANDOFF REPORT  Name/Age/Gender [name] 73 y.o. female  Code Status    Code Status Orders  (From admission, onward)         Start     Ordered   05/28/18 0118  Full code  Continuous     05/28/18 0119        Code Status History    This patient has a current code status but no historical code status.      Home/SNF/Other Home Chief Complaint Fall  Level of Care/Admitting Diagnosis ED Disposition    ED Disposition Condition Comment   Admit  Hospital Area: [facility] [[id number]]  Level of Care: Telemetry [5]  Diagnosis: General weakness [301173]  Admitting Physician: Bobette Mo [[id number]]  Attending Physician: Bobette Mo [[id number]]  PT Class (Do Not Modify): Observation [104]  PT Acc Code (Do Not Modify): Observation [10022]       Medical History Past Medical History:  Diagnosis Date  . Chronic back pain   . GERD (gastroesophageal reflux disease)   . Hypercholesterolemia   . Hyperlipidemia   . Hypertension   . Osteoarthritis of left knee    End-stage  . Palpitations   . PVD (peripheral vascular disease) (HCC)   . Tobacco user     Allergies No Known Allergies  IV Location/Drains/Wounds Patient Lines/Drains/Airways Status   Active Line/Drains/Airways    Name:   Placement date:   Placement time:   Site:   Days:   Peripheral IV 05/28/18 Right Hand   05/28/18    1044    Hand   less than 1          Labs/Imaging Results for orders placed or performed during the hospital encounter of 05/27/18 (from the past 48 hour(s))  CBC with Differential/Platelet     Status: Abnormal   Collection Time: 05/27/18  5:48 PM  Result Value Ref Range   WBC 12.3 (H) 4.0 - 10.5 K/uL   RBC 4.63 3.87 - 5.11 MIL/uL   Hemoglobin 11.9 (L) 12.0 - 15.0 g/dL   HCT 21.3 08.6 - 57.8 %   MCV 86.0 80.0 - 100.0 fL   MCH 25.7 (L) 26.0 - 34.0 pg   MCHC 29.9 (L) 30.0 - 36.0 g/dL   RDW 46.9 62.9 - 52.8 %   Platelets 342 150 - 400 K/uL   nRBC 0.0 0.0 - 0.2 %   Neutrophils Relative % 76 %   Neutro Abs 9.6 (H) 1.7 - 7.7 K/uL   Lymphocytes Relative 11 %   Lymphs Abs 1.3 0.7 - 4.0 K/uL   Monocytes Relative 9 %   Monocytes Absolute 1.1 (H) 0.1 - 1.0 K/uL   Eosinophils Relative 1 %   Eosinophils Absolute 0.1 0.0 - 0.5 K/uL   Basophils Relative 1 %   Basophils Absolute 0.1 0.0 - 0.1 K/uL   Immature Granulocytes 2 %   Abs Immature Granulocytes 0.19 (H) 0.00 - 0.07 K/uL    Comment: Performed at [facility], [address] [place], [place] [address]  Comprehensive metabolic panel     Status: Abnormal   Collection Time: 05/27/18  5:48 PM  Result Value Ref Range   Sodium 136 135 - 145 mmol/L   Potassium 3.5 3.5 - 5.1 mmol/L   Chloride 103 98 - 111 mmol/L   CO2 23 22 - 32 mmol/L   Glucose, Bld 132 (H) 70 - 99 mg/dL   BUN 14 8 - 23 mg/dL   Creatinine,  Ser 1.08 (H) 0.44 - 1.00 mg/dL   Calcium 8.8 (L) 8.9 - 10.3 mg/dL   Total Protein 7.4 6.5 - 8.1 g/dL   Albumin 3.2 (L) 3.5 - 5.0 g/dL   AST 22 15 - 41 U/L   ALT 10 0 - 44 U/L   Alkaline Phosphatase 99 38 - 126 U/L   Total Bilirubin 0.5 0.3 - 1.2 mg/dL   GFR calc non Af Amer 52 (L) >60 mL/min   GFR calc Af Amer 60 (L) >60 mL/min   Anion gap 10 5 - 15    Comment: Performed at [facility], [address] [place], [place] [address]  Troponin I - ONCE - STAT     Status: None   Collection Time: 05/27/18  5:48 PM  Result Value Ref Range   Troponin I <0.03 <0.03 ng/mL    Comment: Performed at [facility], [address], [place], [place] [address]  Lipase, blood     Status: Abnormal   Collection Time: 05/27/18  5:48 PM  Result Value Ref Range   Lipase 56 (H) 11 - 51 U/L    Comment: Performed at [facility], [address], [place], [place] [address]  Urinalysis, Routine w reflex microscopic     Status: Abnormal   Collection Time: 05/27/18  7:00 PM  Result Value Ref Range   Color, Urine YELLOW YELLOW   APPearance CLOUDY (A) CLEAR   Specific Gravity, Urine 1.008 1.005 -  1.030   pH 5.0 5.0 - 8.0   Glucose, UA NEGATIVE NEGATIVE mg/dL   Hgb urine dipstick SMALL (A) NEGATIVE   Bilirubin Urine NEGATIVE NEGATIVE   Ketones, ur NEGATIVE NEGATIVE mg/dL   Protein, ur NEGATIVE NEGATIVE mg/dL   Nitrite NEGATIVE NEGATIVE   Leukocytes,Ua NEGATIVE NEGATIVE   RBC / HPF 0-5 0 - 5 RBC/hpf   WBC, UA 11-20 0 - 5 WBC/hpf   Bacteria, UA MANY (A) NONE SEEN   Squamous Epithelial / LPF 21-50 0 - 5   Mucus PRESENT    Hyaline Casts, UA PRESENT     Comment: Performed at [facility], [address] [place], [place] [address]  CBC WITH DIFFERENTIAL     Status: Abnormal   Collection Time: 05/28/18  5:56 AM  Result Value Ref Range   WBC 14.0 (H) 4.0 - 10.5 K/uL   RBC 4.53 3.87 - 5.11 MIL/uL   Hemoglobin 11.6 (L) 12.0 - 15.0 g/dL   HCT 02.7 25.3 - 66.4 %   MCV 85.4 80.0 - 100.0 fL   MCH 25.6 (L) 26.0 - 34.0 pg   MCHC 30.0 30.0 - 36.0 g/dL   RDW 40.3 47.4 - 25.9 %   Platelets 361 150 - 400 K/uL   nRBC 0.0 0.0 - 0.2 %   Neutrophils Relative % 68 %   Neutro Abs 9.4 (H) 1.7 - 7.7 K/uL   Lymphocytes Relative 20 %   Lymphs Abs 2.8 0.7 - 4.0 K/uL   Monocytes Relative 10 %   Monocytes Absolute 1.4 (H) 0.1 - 1.0 K/uL   Eosinophils Relative 1 %   Eosinophils Absolute 0.2 0.0 - 0.5 K/uL   Basophils Relative 0 %   Basophils Absolute 0.1 0.0 - 0.1 K/uL   Immature Granulocytes 1 %   Abs Immature Granulocytes 0.08 (H) 0.00 - 0.07 K/uL    Comment: Performed at [facility], [address] [place], [place] [address]  Comprehensive metabolic panel     Status: Abnormal   Collection Time: 05/28/18  5:56 AM  Result Value Ref Range   Sodium 138 135 - 145 mmol/L   Potassium 4.1 3.5 - 5.1 mmol/L   Chloride 105 98 - 111 mmol/L   CO2 25 22 - 32 mmol/L   Glucose, Bld 115 (H) 70 - 99 mg/dL   BUN 11 8 - 23 mg/dL   Creatinine, Ser 4.58 0.44 - 1.00 mg/dL   Calcium 8.7 (L) 8.9 - 10.3 mg/dL   Total Protein 6.8 6.5 - 8.1 g/dL   Albumin 3.0 (L) 3.5 - 5.0 g/dL   AST 33 15 - 41 U/L   ALT 11 0 -  44 U/L   Alkaline Phosphatase 89 38 - 126 U/L   Total Bilirubin 0.2 (L) 0.3 - 1.2 mg/dL   GFR calc non Af Amer >60 >60 mL/min   GFR calc Af Amer >60 >60 mL/min   Anion gap 8 5 - 15    Comment: Performed at [facility], [address] [place], [place] [address]  Lipase, blood     Status: None   Collection Time: 05/28/18  5:56 AM  Result Value Ref Range   Lipase 51 11 - 51 U/L    Comment: Performed at [facility], [address], [place], [place] [address]   Ct Head Wo Contrast  Result Date: 05/27/2018 CLINICAL DATA:  Multiple falls.  Dizziness. EXAM: CT HEAD WITHOUT CONTRAST TECHNIQUE: Contiguous axial images were obtained from the base of the skull through the vertex without intravenous contrast. COMPARISON:  None. FINDINGS: Brain: Mild age related volume loss. No acute intracranial abnormality. Specifically, no hemorrhage, hydrocephalus, mass lesion, acute infarction, or significant intracranial injury. Vascular: No hyperdense vessel or unexpected calcification. Skull: No acute calvarial abnormality. Sinuses/Orbits: Rounded soft tissue in the left sphenoid sinus extending into the posterior nasal cavity. Other: None IMPRESSION: No acute intracranial abnormality. Soft tissue opacification of the left sphenoid sinus with rounded soft tissue extending into the posterior left nasal cavity. This presumably is related to chronic sinusitis changes and is thickening, but recommend direct visualization to exclude other soft tissue mass. Electronically Signed   By: Charlett Nose M.D.   On: 05/27/2018 19:24   Ct Angio Ao+bifem W & Or Wo Contrast  Result Date: 05/28/2018 CLINICAL DATA:  73 year old female with concern for decreased blood flow into the left lower extremity. EXAM: CT ANGIOGRAPHY OF ABDOMINAL AORTA WITH ILIOFEMORAL RUNOFF TECHNIQUE: Multidetector CT imaging of the abdomen, pelvis and lower extremities was performed using the standard protocol during bolus administration of intravenous contrast.  Multiplanar CT image reconstructions and MIPs were obtained to evaluate the vascular anatomy. CONTRAST:  ISOVUE-370 IOPAMIDOL (ISOVUE-370) INJECTION 76% COMPARISON:  None. FINDINGS: VASCULAR Aorta: Advanced calcified and noncalcified plaques. There is irregularity of the aorta at the diaphragmatic hiatus where the aorta measures up to 3 cm in diameter. Chronic occlusion and atrophy of the distal abdominal aorta. There is an aortobifemoral bypass graft. 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 There is some diminished flow in the splenic artery and common hepatic artery, hepatic artery proper, and right hepatic artery. There is a replaced left hepatic artery from the left gastric artery. SMA: Atherosclerotic calcification of the origin of the SMA. There is focal area of stenosis of the origin of the SMA with approximately 40% luminal narrowing. The SMA remains patent. Renals: Atherosclerotic calcification of the ostia of the renal arteries. The renal arteries are patent. IMA: Appears occluded. RIGHT Lower Extremity Inflow: Chronic occlusion and scarring of the native  right iliac arteries. There is complete occlusion of the right iliac limb of the aorto bi femoral bypass graft. There is occlusion of the majority of the lumen of the right common femoral artery. There is reconstitution of the flow in the right common femoral artery. Outflow: The superficial and deep femoral arteries as well as the popliteal artery are patent. Runoff: There is flow in the proximal and midportion of the calf arteries. There is diminished flow in the distal portion of these vessels. There is non opacification of these vessels beyond the level of ankle which may represent poor perfusion or timing of the scan. LEFT Lower Extremity Inflow: There is chronic occlusion and scarring of the left iliac arteries. There is a focal area of narrowing of the proximal portion of the left iliac limb of the aortobifemoral bypass  graft. The left iliac graft however remains patent. There is a clot in the left common iliac artery with high-grade focal narrowing of the lumen of the vessel (series 4, image 152). Outflow: The superficial femoral artery, and deep femoral artery are patent. The popliteal artery is suboptimally visualized due to streak artifact caused by left knee arthroplasty. The visualized portion of the left popliteal artery however appears patent. Runoff: The anterior tibial artery appears patent. There appears to be a common artery replacing the posterior tibial and peroneal arteries. This artery also appears patent to the level of ankle. The dorsalis pedis artery appears patent. There appears to be some flow in the proximal portion of the plantar artery. Veins: No obvious venous abnormality within the limitations of this arterial phase study. Review of the MIP images confirms the above findings. NON-VASCULAR Lower chest: Minimal bibasilar atelectatic changes. The visualized lung bases are otherwise clear. No intra-abdominal free air. No free fluid. Hepatobiliary: Apparent diffuse fatty infiltration of the liver. No intrahepatic biliary ductal dilatation. The gallbladder is unremarkable. Pancreas: There is inflammatory changes of the tail of the pancreas most consistent with acute pancreatitis. Correlation with pancreatic enzymes recommended. There is a 3.2 x 3.2 cm low attenuating lesion/collection in the tail of the pancreas most consistent with pseudocyst. A neoplastic process is less likely but not excluded. Clinical correlation and follow-up recommended. Spleen: There is an area of lower attenuation along the lateral aspect of the spleen which may represent an area of splenic infarct or less likely a small subcapsular collection. Adrenals/Urinary Tract: The adrenal glands are unremarkable. There is a 5.7 x 2.5 sent crescentic fluid along the superior pole of the left kidney likely a subcapsular collection or a perinephric  collection. There is a 5 mm right renal interpolar nonobstructing stone. There is right renal parenchyma atrophy and an area of scarring in the inferior pole of the right kidney. There is no hydronephrosis on either side. The visualized ureters and urinary bladder appear unremarkable. Stomach/Bowel: There is moderate stool throughout the colon. There is a 2.8 x 2.0 cm fluid collection along the greater curvature of the stomach likely a pseudocyst. Evaluation is limited due to suboptimal image quality and in the absence of oral contrast. There is no bowel obstruction. The appendix is normal. Lymphatic: No adenopathy. Reproductive: Hysterectomy. There is a 3.2 cm left ovarian cyst. Further evaluation with ultrasound on a nonemergent basis recommended. Other: There is a 2.5 x 4.5 cm lipoma in the right sartorious muscle. Musculoskeletal: Multilevel degenerative changes with lower lumbar posterior fusion. No acute osseous pathology. Spinal stimulator at the level of the lower thoracic spine. IMPRESSION: VASCULAR 1. 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 There is complete occlusion of the right iliac limb of the bypass graft with reconstitution of flow to in the right common femoral artery. 2. Focal area of narrowing of the left common femoral artery. 3. Patent bilateral superficial and deep femoral arteries as well as patent appearance of the calf arteries. 4. 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 NON-VASCULAR Findings of pancreatitis with multiple fluid collections/pseudocysts. Clinical correlation and follow-up recommended. : These results were called by telephone at the time of interpretation on 05/27/2018 at 11:49 pm to PA, LESLIE SOFIA , who verbally acknowledged these results. Electronically Signed   By: Elgie Collard M.D.   On: 05/28/2018 00:02   Dg Hip Unilat With Pelvis 2-3 Views Right  Result Date: 05/27/2018 CLINICAL  DATA:  Initial evaluation for acute hip pain status post fall. EXAM: DG HIP (WITH OR WITHOUT PELVIS) 2-3V RIGHT COMPARISON:  None available. FINDINGS: No acute fracture or dislocation. Femoral heads in normal alignment within the acetabula. Femoral head heights maintained. Bony pelvis intact. Mild osteoarthritic changes about the hips bilaterally. Postoperative changes present within the lower lumbar spine. Generator and locked roots for spinal stimulator device partially visualize within the right lower quadrant. No acute soft tissue abnormality. Surgical clips overlie the inguinal regions bilaterally. IMPRESSION: 1. No acute osseous abnormality about the right hip. 2. Postoperative changes within the lower lumbar spine with spinal stimulator device in place. Electronically Signed   By: Rise Mu M.D.   On: 05/27/2018 16:07    Pending Labs Unresulted Labs (From admission, onward)    Start     Ordered   06/04/18 0500  Creatinine, serum  (enoxaparin (LOVENOX)    CrCl >/= 30 ml/min)  Weekly,   R    Comments:  while on enoxaparin therapy    05/28/18 0119          Vitals/Pain Today's Vitals   05/28/18 1000 05/28/18 1105 05/28/18 1105 05/28/18 1130  BP: (!) 193/59  (!) 160/91 (!) 167/53  Pulse: 66  72 63  Resp: 14  13 (!) 27  Temp:      TempSrc:      SpO2:   94% 96%  Weight:      Height:      PainSc:  7       Isolation Precautions No active isolations  Medications Medications  labetalol (NORMODYNE,TRANDATE) injection 20 mg (20 mg Intravenous Given 05/28/18 1137)  enoxaparin (LOVENOX) injection 40 mg (40 mg Subcutaneous Given 05/28/18 0140)  ondansetron (ZOFRAN) tablet 4 mg (has no administration in time range)    Or  ondansetron (ZOFRAN) injection 4 mg (has no administration in time range)  acetaminophen (TYLENOL) tablet 650 mg (has no administration in time range)    Or  acetaminophen (TYLENOL) suppository 650 mg (has no administration in time range)  fentaNYL  (SUBLIMAZE) injection 50 mcg (50 mcg Intravenous Given 05/28/18 0926)  ipratropium-albuterol (DUONEB) 0.5-2.5 (3) MG/3ML nebulizer solution 3 mL (has no administration in time range)  nicotine (NICODERM CQ - dosed in mg/24 hours) patch 21 mg (21 mg Transdermal Refused 05/28/18 0930)  budesonide (PULMICORT) nebulizer solution 0.25 mg (0.25 mg Nebulization Given 05/28/18 0847)  lactated ringers infusion ( Intravenous New Bag/Given 05/28/18 1148)  fentaNYL (SUBLIMAZE) injection 50 mcg (50 mcg Intravenous Given 05/27/18 2142)  iopamidol (ISOVUE-370) 76 % injection 150 mL (150 mLs Intravenous Contrast Given 05/27/18 2227)  cefTRIAXone (ROCEPHIN) 1 g  in sodium chloride 0.9 % 100 mL IVPB (0 g Intravenous Stopped 05/28/18 0155)    Mobility Assist X1

## 2018-05-28 NOTE — Care Management Obs Status (Signed)
MEDICARE OBSERVATION STATUS NOTIFICATION   Patient Details  Name: CAMRIN ALAMIN MRN: [id number] Date of Birth: Jul 13, 1945   Medicare Observation Status Notification Given:  Yes    Malcolm Metro, RN 05/28/2018, 1:12 PM

## 2018-05-28 NOTE — ED Notes (Signed)
Report given to [facility], 300 RN.

## 2018-05-28 NOTE — H&P (Signed)
History and Physical    [name] ASN:[id number] DOB: [birth year] DOA: 05/27/2018  PCP: Junie Spencer, [name]   Patient coming from: Home.  I have personally briefly reviewed patient's old medical records in [facility]  Chief Complaint: Bilateral leg pain, generalized weakness and fall.  HPI: [name] is a 73 y.o. female with medical history significant of chronic back pain, GERD, hyperlipidemia, hypertension, left knee osteoarthritis, palpitations, PVD, history of tobacco use who is coming to the emergency department after having a fall in 1 of the rooms of the emergency department while she was visiting her mother who was brought to the ED earlier.  She complains of bilateral leg pain after fall, but stated that she has been having pain at home as well.  She states that she has been very weak lately.  She denies fever, chills, sore throat, rhinorrhea.  However, she says she has been having wheezing and coughing.  Denies hemoptysis.  No chest pain, palpitations, dizziness or diaphoresis, PND, orthopnea or pitting edema of the lower extremities.  She complains of occasional back pain that comes around to her epigastric area.  She gets occasional nausea, but denies emesis, diarrhea, constipation, melena or hematochezia.  No dysuria, frequency or hematuria.  No polyuria, polydipsia, polyphagia or blurred vision.  ED Course: Initial vital signs temperature 36.7 C, pulse 79, respirations 16, blood pressure 151/91 mmHg and O2 sat 99% on room air.  She was given fentanyl 50 mcg IVP x1 and a gram of ceftriaxone before being presented to the hospitalist service.  Urinalysis has a cloudy appearance, with small hemoglobinuria, many bacteria on microscopic exam, but no other abnormalities.  White count is 12.3 with 76% neutrophils, 11% lymphocytes and 9% monocytes.  Hemoglobin was 11.9 and platelets 342.  Troponin level was normal.  Lipase was elevated at 56 units/L.  Her glucose is 132,  creatinine 1.0 a, BUN 14 and calcium 8.8 mg/dL.  Her albumin is decreased at 3.2 g/dL.  All other values are within normal limits.  Imaging: Pelvis x-ray did not show any acute osseous abnormality.  CT head with contrast showed no acute intracranial normality.  There is presumed left sphenoid sinusitis.  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  She is status post prior aortobifemoral bypass graft.  There is complete occlusion of the right iliac limb of the bypass graft with reconstitution of flow to the right common femoral artery.  There is focal area of narrowing in the left common femoral artery.  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  Nonvascular findings there is possibility of pancreatitis with multiple fluid collections/pseudocyst.  Review of Systems: As per HPI otherwise 10 point review of systems negative.   Past Medical History:  Diagnosis Date  . Chronic back pain   . GERD (gastroesophageal reflux disease)   . Hypercholesterolemia   . Hyperlipidemia   . Hypertension   . Osteoarthritis of left knee    End-stage  . Palpitations   . PVD (peripheral vascular disease) (HCC)   . Tobacco user     Past Surgical History:  Procedure Laterality Date  . Angiogram-type unspecified  06/13/00   Dr. Hart [place]  . FEMORAL-POPLITEAL BYPASS GRAFT    . FINGER TENDON REPAIR  1988   Right mallet  . KNEE ARTHROSCOPY  1996   Left  . LUMBAR FUSION  1998   Dr.  Long     reports that she has been smoking cigarettes. She has a 52.00 pack-year smoking history. She has never used smokeless tobacco. She reports that she does not drink alcohol or use drugs.  No Known Allergies  Family History  Problem Relation Age of Onset  . Heart failure Father        CHF  . Hypertension Father   . Heart disease Father   . Hypertension Paternal Aunt   . Hypertension Paternal Uncle   . Hypertension  Paternal Grandmother   . Hypertension Paternal Grandfather   . Coronary artery disease Neg Hx        Premature   Prior to Admission medications   Medication Sig Start Date End Date Taking? Authorizing Provider  atorvastatin (LIPITOR) 40 MG tablet Take 1 tablet (40 mg total) by mouth daily. 10/23/17   Daphine Deutscher, Mary-Margaret, [name]  cephALEXin (KEFLEX) 500 MG capsule Take 1 capsule (500 mg total) by mouth 4 (four) times daily for 10 days. 05/27/18 06/06/18  Elson Areas, PA-C  DULoxetine (CYMBALTA) 60 MG capsule Take 1 capsule (60 mg total) by mouth daily. 10/23/17   Bennie Pierini, [name]  fenofibrate 160 MG tablet TAKE 1 TABLET BY MOUTH ONCE DAILY 11/25/17   Daphine Deutscher, Mary-Margaret, [name]  lisinopril (PRINIVIL,ZESTRIL) 20 MG tablet Take 1 tablet (20 mg total) by mouth daily. 04/24/18   Junie Spencer, [name]  meloxicam (MOBIC) 15 MG tablet Take 1 tablet (15 mg total) by mouth daily. 09/02/16   Daphine Deutscher, Mary-Margaret, [name]  omeprazole (PRILOSEC) 20 MG capsule Take 1 capsule (20 mg total) by mouth daily. 03/05/18   Junie Spencer, [name]  pregabalin (LYRICA) 75 MG capsule Take 1 capsule (75 mg total) by mouth 3 (three) times daily. 05/01/18   Junie Spencer, [name]  tiZANidine (ZANAFLEX) 4 MG tablet Take 1-2 tablets (4-8 mg total) by mouth every 8 (eight) hours as needed for muscle spasms. 09/02/16   Bennie Pierini, [name]    Physical Exam: Vitals:   05/27/18 2100 05/27/18 2230 05/28/18 0000 05/28/18 0030  BP: (!) 187/61 (!) 192/74 (!) 214/79 (!) 200/72  Pulse: 85 78 72 74  Resp:      Temp:      TempSrc:      SpO2:      Weight:      Height:        Constitutional: NAD, calm, comfortable Eyes: PERRL, lids and conjunctivae normal ENMT: Mucous membranes are moist. Posterior pharynx clear of any exudate or lesions. Neck: normal, supple, no masses, no thyromegaly Respiratory: Decreased breath sounds with bilateral rhonchi and wheezing, no crackles. Normal respiratory effort. No accessory muscle use.    Cardiovascular: Regular rate and rhythm, no murmurs / rubs / gallops. No extremity edema.  Unable to palpate pedal pulses. No carotid bruits.  Abdomen: Soft, positive for gastric tenderness, no masses palpated. No hepatosplenomegaly. Bowel sounds positive.  Musculoskeletal: no clubbing / cyanosis. Good ROM, no contractures. Normal muscle tone.  Skin: no rashes, lesions, ulcers on Neurologic: CN 2-12 grossly intact. Sensation intact, DTR normal. Strength 5/5 in all 4.  Psychiatric: Normal judgment and insight. Alert and oriented x 3. Normal mood.   Labs on Admission: I have personally reviewed following labs and imaging studies  CBC: Recent Labs  Lab 05/22/18 1039 05/27/18 1748  WBC 8.4 12.3*  NEUTROABS 5.1 9.6*  HGB 12.5 11.9*  HCT 39.4 39.8  MCV 85 86.0  PLT 437 342   Basic Metabolic Panel: Recent Labs  Lab 05/22/18 1039 05/27/18 1748  NA 142 136  K 4.0 3.5  CL 102 103  CO2 23 23  GLUCOSE 96 132*  BUN 10 14  CREATININE 0.98 1.08*  CALCIUM 9.2 8.8*   GFR: Estimated Creatinine Clearance: 48.2 mL/min (A) (by C-G formula based on SCr of 1.08 mg/dL (H)). Liver Function Tests: Recent Labs  Lab 05/22/18 1039 05/27/18 1748  AST 18 22  ALT 10 10  ALKPHOS 118* 99  BILITOT 0.3 0.5  PROT 6.8 7.4  ALBUMIN 3.6* 3.2*   Recent Labs  Lab 05/27/18 1748  LIPASE 56*   No results for input(s): AMMONIA in the last 168 hours. Coagulation Profile: No results for input(s): INR, PROTIME in the last 168 hours. Cardiac Enzymes: Recent Labs  Lab 05/27/18 1748  TROPONINI <0.03   BNP (last 3 results) No results for input(s): PROBNP in the last 8760 hours. HbA1C: No results for input(s): HGBA1C in the last 72 hours. CBG: No results for input(s): GLUCAP in the last 168 hours. Lipid Profile: No results for input(s): CHOL, HDL, LDLCALC, TRIG, CHOLHDL, LDLDIRECT in the last 72 hours. Thyroid Function Tests: No results for input(s): TSH, T4TOTAL, FREET4, T3FREE, THYROIDAB in the  last 72 hours. Anemia Panel: No results for input(s): VITAMINB12, FOLATE, FERRITIN, TIBC, IRON, RETICCTPCT in the last 72 hours. Urine analysis:    Component Value Date/Time   COLORURINE YELLOW 05/27/2018 1900   APPEARANCEUR CLOUDY (A) 05/27/2018 1900   LABSPEC 1.008 05/27/2018 1900   PHURINE 5.0 05/27/2018 1900   GLUCOSEU NEGATIVE 05/27/2018 1900   HGBUR SMALL (A) 05/27/2018 1900   BILIRUBINUR NEGATIVE 05/27/2018 1900   KETONESUR NEGATIVE 05/27/2018 1900   PROTEINUR NEGATIVE 05/27/2018 1900   NITRITE NEGATIVE 05/27/2018 1900   LEUKOCYTESUR NEGATIVE 05/27/2018 1900    Radiological Exams on Admission: Ct Head Wo Contrast  Result Date: 05/27/2018 CLINICAL DATA:  Multiple falls.  Dizziness. EXAM: CT HEAD WITHOUT CONTRAST TECHNIQUE: Contiguous axial images were obtained from the base of the skull through the vertex without intravenous contrast. COMPARISON:  None. FINDINGS: Brain: Mild age related volume loss. No acute intracranial abnormality. Specifically, no hemorrhage, hydrocephalus, mass lesion, acute infarction, or significant intracranial injury. Vascular: No hyperdense vessel or unexpected calcification. Skull: No acute calvarial abnormality. Sinuses/Orbits: Rounded soft tissue in the left sphenoid sinus extending into the posterior nasal cavity. Other: None IMPRESSION: No acute intracranial abnormality. Soft tissue opacification of the left sphenoid sinus with rounded soft tissue extending into the posterior left nasal cavity. This presumably is related to chronic sinusitis changes and is thickening, but recommend direct visualization to exclude other soft tissue mass. Electronically Signed   By: Charlett Nose M.D.   On: 05/27/2018 19:24   Ct Angio Ao+bifem W & Or Wo Contrast  Result Date: 05/28/2018 CLINICAL DATA:  73 year old female with concern for decreased blood flow into the left lower extremity. EXAM: CT ANGIOGRAPHY OF ABDOMINAL AORTA WITH ILIOFEMORAL RUNOFF TECHNIQUE:  Multidetector CT imaging of the abdomen, pelvis and lower extremities was performed using the standard protocol during bolus administration of intravenous contrast. Multiplanar CT image reconstructions and MIPs were obtained to evaluate the vascular anatomy. CONTRAST:  ISOVUE-370 IOPAMIDOL (ISOVUE-370) INJECTION 76% COMPARISON:  None. FINDINGS: VASCULAR Aorta: Advanced calcified and noncalcified plaques. There is irregularity of the aorta at the diaphragmatic hiatus where the aorta measures up to 3 cm in diameter. Chronic occlusion and atrophy of the distal abdominal aorta. There is an aortobifemoral bypass graft. 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 There is some diminished flow in the splenic artery and common hepatic artery, hepatic artery proper, and right hepatic artery. There is a replaced left hepatic artery from the left gastric artery. SMA: Atherosclerotic calcification of the origin of the SMA. There is focal area of stenosis of the origin of the SMA with approximately 40% luminal narrowing. The SMA remains patent. Renals: Atherosclerotic calcification of the ostia of the renal arteries. The renal arteries are patent. IMA: Appears occluded. RIGHT Lower Extremity Inflow: Chronic occlusion and scarring of the native right iliac arteries. There is complete occlusion of the right iliac limb of the aorto bi femoral bypass graft. There is occlusion of the majority of the lumen of the right common femoral artery. There is reconstitution of the flow in the right common femoral artery. Outflow: The superficial and deep femoral arteries as well as the popliteal artery are patent. Runoff: There is flow in the proximal and midportion of the calf arteries. There is diminished flow in the distal portion of these vessels. There is non opacification of these vessels beyond the level of ankle which may represent poor perfusion or timing of the scan. LEFT Lower Extremity Inflow: There is chronic  occlusion and scarring of the left iliac arteries. There is a focal area of narrowing of the proximal portion of the left iliac limb of the aortobifemoral bypass graft. The left iliac graft however remains patent. There is a clot in the left common iliac artery with high-grade focal narrowing of the lumen of the vessel (series 4, image 152). Outflow: The superficial femoral artery, and deep femoral artery are patent. The popliteal artery is suboptimally visualized due to streak artifact caused by left knee arthroplasty. The visualized portion of the left popliteal artery however appears patent. Runoff: The anterior tibial artery appears patent. There appears to be a common artery replacing the posterior tibial and peroneal arteries. This artery also appears patent to the level of ankle. The dorsalis pedis artery appears patent. There appears to be some flow in the proximal portion of the plantar artery. Veins: No obvious venous abnormality within the limitations of this arterial phase study. Review of the MIP images confirms the above findings. NON-VASCULAR Lower chest: Minimal bibasilar atelectatic changes. The visualized lung bases are otherwise clear. No intra-abdominal free air. No free fluid. Hepatobiliary: Apparent diffuse fatty infiltration of the liver. No intrahepatic biliary ductal dilatation. The gallbladder is unremarkable. Pancreas: There is inflammatory changes of the tail of the pancreas most consistent with acute pancreatitis. Correlation with pancreatic enzymes recommended. There is a 3.2 x 3.2 cm low attenuating lesion/collection in the tail of the pancreas most consistent with pseudocyst. A neoplastic process is less likely but not excluded. Clinical correlation and follow-up recommended. Spleen: There is an area of lower attenuation along the lateral aspect of the spleen which may represent an area of splenic infarct or less likely a small subcapsular collection. Adrenals/Urinary Tract: The  adrenal glands are unremarkable. There is a 5.7 x 2.5 sent crescentic fluid along the superior pole of the left kidney likely a subcapsular collection or a perinephric collection. There is a 5 mm right renal interpolar nonobstructing stone. There is right renal parenchyma atrophy and an area of scarring in the inferior pole of the right kidney. There is no hydronephrosis on either side. The visualized ureters and urinary bladder appear unremarkable. Stomach/Bowel: There is moderate stool throughout the colon. There is a 2.8 x 2.0 cm fluid collection along the greater curvature of  the stomach likely a pseudocyst. Evaluation is limited due to suboptimal image quality and in the absence of oral contrast. There is no bowel obstruction. The appendix is normal. Lymphatic: No adenopathy. Reproductive: Hysterectomy. There is a 3.2 cm left ovarian cyst. Further evaluation with ultrasound on a nonemergent basis recommended. Other: There is a 2.5 x 4.5 cm lipoma in the right sartorious muscle. Musculoskeletal: Multilevel degenerative changes with lower lumbar posterior fusion. No acute osseous pathology. Spinal stimulator at the level of the lower thoracic spine. IMPRESSION: VASCULAR 1. 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 There is complete occlusion of the right iliac limb of the bypass graft with reconstitution of flow to in the right common femoral artery. 2. Focal area of narrowing of the left common femoral artery. 3. Patent bilateral superficial and deep femoral arteries as well as patent appearance of the calf arteries. 4. 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 NON-VASCULAR Findings of pancreatitis with multiple fluid collections/pseudocysts. Clinical correlation and follow-up recommended. : These results were called by telephone at the time of interpretation on 05/27/2018 at 11:49 pm to PA, LESLIE SOFIA , who verbally acknowledged  these results. Electronically Signed   By: Elgie Collard M.D.   On: 05/28/2018 00:02   Dg Hip Unilat With Pelvis 2-3 Views Right  Result Date: 05/27/2018 CLINICAL DATA:  Initial evaluation for acute hip pain status post fall. EXAM: DG HIP (WITH OR WITHOUT PELVIS) 2-3V RIGHT COMPARISON:  None available. FINDINGS: No acute fracture or dislocation. Femoral heads in normal alignment within the acetabula. Femoral head heights maintained. Bony pelvis intact. Mild osteoarthritic changes about the hips bilaterally. Postoperative changes present within the lower lumbar spine. Generator and locked roots for spinal stimulator device partially visualize within the right lower quadrant. No acute soft tissue abnormality. Surgical clips overlie the inguinal regions bilaterally. IMPRESSION: 1. No acute osseous abnormality about the right hip. 2. Postoperative changes within the lower lumbar spine with spinal stimulator device in place. Electronically Signed   By: Rise Mu M.D.   On: 05/27/2018 16:07    EKG: Independently reviewed.  Vent. rate 66 BPM PR interval 136 ms QRS duration 80 ms QT/QTc 440/461 ms P-R-T axes 54 71 58 Normal sinus rhythm Normal ECG  Assessment/Plan Principal Problem:   General weakness Likely due to COPD/PAD. Observation/telemetry. Supplemental oxygen as needed. Smoking cessation advised Analgesics as needed for pain.  Active Problems:   Pancreatitis Clear liquids. Follow-up lipase level. Analgesics as needed Advised to avoid fried or fatty foods.    Peripheral vascular disease (HCC) Advised several times to cease smoking. Dr. Arbie Cookey wants to see her on Monday at the vascular surgery clinic. Continue aspirin, atorvastatin and fenofibrate.    Hyperlipidemia with target LDL less than 100 Continue fenofibrate 160 mg p.o. daily. Continue atorvastatin 40 mg p.o. daily    TOBACCO ABUSE Smoking cessation advised. Staff to provide tobacco cessation  information. Nicotine replacement therapy ordered.    COPD Supplemental oxygen as needed. Start budesonide nebs every 12 hours. DuoNeb every 4 hours as needed.    Essential hypertension Continue lisinopril 20 mg p.o. daily    GERD Protonix 40 mg p.o. daily.    Depression Continue Cymbalta 60 mg p.o. daily.    Fall Continue analgesics as needed. Continue Mobic 15 mg p.o. daily.  Will not treat UTI since this seems to be asymptomatic bacteriuria.     DVT prophylaxis: Lovenox SQ. Code Status:  Full code. Family Communication:  Disposition Plan: Observation for IV hydration, IV antibiotics, symptoms Rx and further work-up. Consults called: Dr. Arbie Cookey was called by the ED medical staff and suggested follow-up next week. Admission status: Observation/telemetry.   Bobette Mo MD [facility]  05/28/2018, 1:21 AM   This document was prepared using Dragon voice recognition software may contain some unintended errors.

## 2018-05-29 ENCOUNTER — Observation Stay (HOSPITAL_COMMUNITY): Payer: Medicare Other

## 2018-05-29 DIAGNOSIS — R531 Weakness: Secondary | ICD-10-CM | POA: Diagnosis present

## 2018-05-29 DIAGNOSIS — F320 Major depressive disorder, single episode, mild: Secondary | ICD-10-CM | POA: Diagnosis not present

## 2018-05-29 DIAGNOSIS — I75021 Atheroembolism of right lower extremity: Secondary | ICD-10-CM | POA: Diagnosis not present

## 2018-05-29 DIAGNOSIS — Z8249 Family history of ischemic heart disease and other diseases of the circulatory system: Secondary | ICD-10-CM | POA: Diagnosis not present

## 2018-05-29 DIAGNOSIS — N390 Urinary tract infection, site not specified: Secondary | ICD-10-CM | POA: Diagnosis present

## 2018-05-29 DIAGNOSIS — E785 Hyperlipidemia, unspecified: Secondary | ICD-10-CM | POA: Diagnosis present

## 2018-05-29 DIAGNOSIS — I998 Other disorder of circulatory system: Secondary | ICD-10-CM | POA: Diagnosis present

## 2018-05-29 DIAGNOSIS — Z791 Long term (current) use of non-steroidal anti-inflammatories (NSAID): Secondary | ICD-10-CM | POA: Diagnosis not present

## 2018-05-29 DIAGNOSIS — T82868A Thrombosis of vascular prosthetic devices, implants and grafts, initial encounter: Secondary | ICD-10-CM | POA: Diagnosis not present

## 2018-05-29 DIAGNOSIS — R1030 Lower abdominal pain, unspecified: Secondary | ICD-10-CM | POA: Diagnosis not present

## 2018-05-29 DIAGNOSIS — M1712 Unilateral primary osteoarthritis, left knee: Secondary | ICD-10-CM | POA: Diagnosis present

## 2018-05-29 DIAGNOSIS — I745 Embolism and thrombosis of iliac artery: Secondary | ICD-10-CM | POA: Diagnosis present

## 2018-05-29 DIAGNOSIS — F17210 Nicotine dependence, cigarettes, uncomplicated: Secondary | ICD-10-CM | POA: Diagnosis present

## 2018-05-29 DIAGNOSIS — M5442 Lumbago with sciatica, left side: Secondary | ICD-10-CM | POA: Diagnosis not present

## 2018-05-29 DIAGNOSIS — K219 Gastro-esophageal reflux disease without esophagitis: Secondary | ICD-10-CM | POA: Diagnosis present

## 2018-05-29 DIAGNOSIS — W07XXXA Fall from chair, initial encounter: Secondary | ICD-10-CM | POA: Diagnosis present

## 2018-05-29 DIAGNOSIS — R296 Repeated falls: Secondary | ICD-10-CM | POA: Diagnosis present

## 2018-05-29 DIAGNOSIS — K859 Acute pancreatitis without necrosis or infection, unspecified: Secondary | ICD-10-CM | POA: Diagnosis present

## 2018-05-29 DIAGNOSIS — J441 Chronic obstructive pulmonary disease with (acute) exacerbation: Secondary | ICD-10-CM | POA: Diagnosis present

## 2018-05-29 DIAGNOSIS — I739 Peripheral vascular disease, unspecified: Secondary | ICD-10-CM | POA: Diagnosis not present

## 2018-05-29 DIAGNOSIS — Z981 Arthrodesis status: Secondary | ICD-10-CM | POA: Diagnosis not present

## 2018-05-29 DIAGNOSIS — I75029 Atheroembolism of unspecified lower extremity: Secondary | ICD-10-CM | POA: Diagnosis not present

## 2018-05-29 DIAGNOSIS — E7800 Pure hypercholesterolemia, unspecified: Secondary | ICD-10-CM | POA: Diagnosis present

## 2018-05-29 DIAGNOSIS — S7001XA Contusion of right hip, initial encounter: Secondary | ICD-10-CM | POA: Diagnosis present

## 2018-05-29 DIAGNOSIS — D62 Acute posthemorrhagic anemia: Secondary | ICD-10-CM | POA: Diagnosis not present

## 2018-05-29 DIAGNOSIS — I743 Embolism and thrombosis of arteries of the lower extremities: Secondary | ICD-10-CM | POA: Diagnosis present

## 2018-05-29 DIAGNOSIS — N170 Acute kidney failure with tubular necrosis: Secondary | ICD-10-CM | POA: Diagnosis not present

## 2018-05-29 DIAGNOSIS — I10 Essential (primary) hypertension: Secondary | ICD-10-CM | POA: Diagnosis present

## 2018-05-29 DIAGNOSIS — Z79899 Other long term (current) drug therapy: Secondary | ICD-10-CM | POA: Diagnosis not present

## 2018-05-29 DIAGNOSIS — G8929 Other chronic pain: Secondary | ICD-10-CM | POA: Diagnosis present

## 2018-05-29 DIAGNOSIS — M5126 Other intervertebral disc displacement, lumbar region: Secondary | ICD-10-CM | POA: Diagnosis present

## 2018-05-29 DIAGNOSIS — F329 Major depressive disorder, single episode, unspecified: Secondary | ICD-10-CM | POA: Diagnosis present

## 2018-05-29 DIAGNOSIS — F17200 Nicotine dependence, unspecified, uncomplicated: Secondary | ICD-10-CM | POA: Diagnosis not present

## 2018-05-29 LAB — COMPREHENSIVE METABOLIC PANEL
ALK PHOS: 99 U/L (ref 38–126)
ALT: 36 U/L (ref 0–44)
AST: 141 U/L — ABNORMAL HIGH (ref 15–41)
Albumin: 3.2 g/dL — ABNORMAL LOW (ref 3.5–5.0)
Anion gap: 12 (ref 5–15)
BUN: 8 mg/dL (ref 8–23)
CALCIUM: 9.2 mg/dL (ref 8.9–10.3)
CO2: 23 mmol/L (ref 22–32)
Chloride: 100 mmol/L (ref 98–111)
Creatinine, Ser: 0.63 mg/dL (ref 0.44–1.00)
GFR calc Af Amer: >60 mL/min (ref >60)
GFR calc non Af Amer: >60 mL/min (ref >60)
Glucose, Bld: 132 mg/dL — ABNORMAL HIGH (ref 70–99)
Potassium: 3.5 mmol/L (ref 3.5–5.1)
Sodium: 135 mmol/L (ref 135–145)
TOTAL PROTEIN: 7.6 g/dL (ref 6.5–8.1)
Total Bilirubin: 0.7 mg/dL (ref 0.3–1.2)

## 2018-05-29 LAB — CBC
HEMATOCRIT: 39.4 % (ref 36.0–46.0)
HEMOGLOBIN: 12 g/dL (ref 12.0–15.0)
MCH: 25.8 pg — ABNORMAL LOW (ref 26.0–34.0)
MCHC: 30.5 g/dL (ref 30.0–36.0)
MCV: 84.5 fL (ref 80.0–100.0)
Platelets: 393 10*3/uL (ref 150–400)
RBC: 4.66 MIL/uL (ref 3.87–5.11)
RDW: 15.2 % (ref 11.5–15.5)
WBC: 14.2 10*3/uL — ABNORMAL HIGH (ref 4.0–10.5)
nRBC: 0 % (ref 0.0–0.2)

## 2018-05-29 LAB — LIPASE, BLOOD: Lipase: 50 U/L (ref 11–51)

## 2018-05-29 LAB — LACTIC ACID, PLASMA
Lactic Acid, Venous: 2 mmol/L (ref 0.5–1.9)
Lactic Acid, Venous: 1.7 mmol/L (ref 0.5–1.9)

## 2018-05-29 LAB — PROCALCITONIN: Procalcitonin: <0.1 ng/mL

## 2018-05-29 MED ORDER — NICOTINE POLACRILEX 2 MG MT GUM
2.0000 mg | CHEWING_GUM | OROMUCOSAL | Status: DC | PRN
  Filled 2018-05-29: qty 1

## 2018-05-29 MED ORDER — ATORVASTATIN CALCIUM 40 MG PO TABS
40.0000 mg | ORAL_TABLET | Freq: Every day | ORAL | Status: DC
  Administered 2018-05-29 – 2018-06-08 (×10): 40 mg via ORAL
  Filled 2018-05-29 (×10): qty 1

## 2018-05-29 MED ORDER — MELOXICAM 7.5 MG PO TABS
15.0000 mg | ORAL_TABLET | Freq: Every day | ORAL | Status: DC
  Administered 2018-05-29 – 2018-05-30 (×2): 15 mg via ORAL
  Filled 2018-05-29 (×4): qty 2

## 2018-05-29 MED ORDER — LISINOPRIL 10 MG PO TABS
20.0000 mg | ORAL_TABLET | Freq: Every day | ORAL | Status: DC
  Administered 2018-05-29 – 2018-05-30 (×2): 20 mg via ORAL
  Filled 2018-05-29 (×2): qty 2

## 2018-05-29 MED ORDER — PANTOPRAZOLE SODIUM 40 MG PO TBEC
40.0000 mg | DELAYED_RELEASE_TABLET | Freq: Every day | ORAL | Status: DC
  Administered 2018-05-29 – 2018-05-30 (×2): 40 mg via ORAL
  Filled 2018-05-29 (×2): qty 1

## 2018-05-29 MED ORDER — FENOFIBRATE 160 MG PO TABS
160.0000 mg | ORAL_TABLET | Freq: Every day | ORAL | Status: DC
  Administered 2018-05-29 – 2018-06-08 (×10): 160 mg via ORAL
  Filled 2018-05-29 (×10): qty 1

## 2018-05-29 MED ORDER — PREGABALIN 100 MG PO CAPS
200.0000 mg | ORAL_CAPSULE | Freq: Three times a day (TID) | ORAL | Status: DC
  Administered 2018-05-29 – 2018-06-08 (×29): 200 mg via ORAL
  Filled 2018-05-29 (×11): qty 2
  Filled 2018-05-29 (×2): qty 1
  Filled 2018-05-29: qty 2
  Filled 2018-05-29: qty 1
  Filled 2018-05-29: qty 2
  Filled 2018-05-29: qty 1
  Filled 2018-05-29 (×2): qty 2
  Filled 2018-05-29: qty 1
  Filled 2018-05-29 (×4): qty 2
  Filled 2018-05-29: qty 1
  Filled 2018-05-29 (×4): qty 2
  Filled 2018-05-29: qty 4
  Filled 2018-05-29: qty 2

## 2018-05-29 MED ORDER — DULOXETINE HCL 60 MG PO CPEP
60.0000 mg | ORAL_CAPSULE | Freq: Every day | ORAL | Status: DC
  Administered 2018-05-29 – 2018-06-08 (×10): 60 mg via ORAL
  Filled 2018-05-29 (×10): qty 1

## 2018-05-29 MED ORDER — AMLODIPINE BESYLATE 5 MG PO TABS
5.0000 mg | ORAL_TABLET | Freq: Every day | ORAL | Status: DC
  Administered 2018-05-29: 5 mg via ORAL
  Filled 2018-05-29 (×2): qty 1

## 2018-05-29 NOTE — Plan of Care (Signed)
  Problem: Acute Rehab PT Goals(only PT should resolve) Goal: Patient Will Transfer Sit To/From Stand Outcome: Progressing Flowsheets (Taken 05/29/2018 1215) Patient will transfer sit to/from stand: with modified independence Goal: Pt Will Transfer Bed To Chair/Chair To Bed Outcome: Progressing Flowsheets (Taken 05/29/2018 1215) Pt will Transfer Bed to Chair/Chair to Bed: with modified independence Goal: Pt Will Ambulate Outcome: Progressing Flowsheets (Taken 05/29/2018 1215) Pt will Ambulate: 100 feet; with supervision; with rolling walker   12:15 PM, 05/29/18 Domenick Bookbinder, DPT Physical Therapist with [facility] [phone] office

## 2018-05-29 NOTE — Evaluation (Signed)
Physical Therapy Evaluation Patient Details Name: [name] MRN: [id number] DOB: 1945-12-06 Today's Date: 05/29/2018   History of Present Illness  [name] is a 73 y.o. female with medical history significant of chronic back pain, GERD, hyperlipidemia, hypertension, left knee osteoarthritis, palpitations, PVD, history of tobacco use who is coming to the emergency department after having a fall in 1 of the rooms of the emergency department while she was visiting her mother who was brought to the ED earlier.  She complains of bilateral leg pain after fall, but stated that she has been having pain at home as well.  She states that she has been very weak lately.  She denies fever, chills, sore throat, rhinorrhea.  However, she says she has been having wheezing and coughing.  Denies hemoptysis.  No chest pain, palpitations, dizziness or diaphoresis, PND, orthopnea or pitting edema of the lower extremities.  She complains of occasional back pain that comes around to her epigastric area.  She gets occasional nausea, but denies emesis, diarrhea, constipation, melena or hematochezia.     Clinical Impression  Patient presents supine in bed and is agreeable to therapy. Patient near baseline for functional mobility and gait, slightly limited secondary to generalized weakness and fatigue with activity. Pt requires increased time to complete bed mobility and mod I. Pt requires verbal cues for transfers and gait using RW due to impulsive behavior. Pt ambulates with min guard assist demonstrating ataxic-like pattern, occasional R knee buckling, flat R foot posture throughout gait cycle without foot drop, no loss of balance and mild shortness of breath. Pt limited secondary to R knee buckling sensation and fatigue. Pt with weaker RLE per MMT, but pt complains of pain with testing, equal sensation bilaterally. Patient left in bed, bed alarm on with call bell in reach. Patient will benefit from continued  physical therapy in hospital and recommended venue below to increase strength, balance, endurance for safe ADLs and gait.     Follow Up Recommendations Home health PT;Supervision - Intermittent    Equipment Recommendations  Rolling walker with 5" wheels    Recommendations for Other Services       Precautions / Restrictions Precautions Precautions: Fall Restrictions Weight Bearing Restrictions: No      Mobility  Bed Mobility Overal bed mobility: Modified Independent             General bed mobility comments: increased time  Transfers Overall transfer level: Needs assistance Equipment used: Rolling walker (2 wheeled);None Transfers: Sit to/from Stand Sit to Stand: Min guard         General transfer comment: impulsive, verbal cues for hand placement to avoid pulling on RW  Ambulation/Gait Ambulation/Gait assistance: Min guard Gait Distance (Feet): 40 Feet Assistive device: Rolling walker (2 wheeled) Gait Pattern/deviations: Decreased step length - right;Decreased step length - left;Decreased stride length Gait velocity: decreased   General Gait Details: Pt with ataxic-like gait pattern, occasional R knee buckling, flat foot posture on R throughout gait cycle, mild shortness of breath noted  Stairs            Wheelchair Mobility    Modified Rankin (Stroke Patients Only)       Balance Overall balance assessment: Needs assistance Sitting-balance support: Feet supported;No upper extremity supported Sitting balance-Leahy Scale: Good Sitting balance - Comments: seated EOB   Standing balance support: During functional activity;Bilateral upper extremity supported Standing balance-Leahy Scale: Fair Standing balance comment: with RW  Pertinent Vitals/Pain Pain Assessment: No/denies pain    Home Living Family/patient expects to be discharged to:: Private residence Living Arrangements: Alone Available Help at  Discharge: Family;Friend(s) Type of Home: House Home Access: Level entry     Home Layout: One level Home Equipment: Cane - single point;Bedside commode;Wheelchair - manual      Prior Function Level of Independence: Independent         Comments: Pt reports Ind with all ADLs, ambulating community distances without AD, and Ind with driving.     Hand Dominance   Dominant Hand: Right    Extremity/Trunk Assessment   Upper Extremity Assessment Upper Extremity Assessment: Overall WFL for tasks assessed    Lower Extremity Assessment Lower Extremity Assessment: RLE deficits/detail;LLE deficits/detail RLE Deficits / Details: grossly 3/5 secondary to c/o pain from recent fall RLE Sensation: WNL(equal sensation to light touch bilaterally) LLE Deficits / Details: grossly 4/5 LLE Sensation: WNL(equal sensation to light touch bilaterally)    Cervical / Trunk Assessment Cervical / Trunk Assessment: Normal  Communication   Communication: No difficulties  Cognition Arousal/Alertness: Awake/alert Behavior During Therapy: WFL for tasks assessed/performed Overall Cognitive Status: Within Functional Limits for tasks assessed                                 General Comments: Pt slow to answer questions, requires occasional repeating of commands      General Comments      Exercises     Assessment/Plan    PT Assessment Patient needs continued PT services  PT Problem List Decreased strength;Decreased activity tolerance;Decreased balance;Decreased mobility       PT Treatment Interventions Gait training;Functional mobility training;Therapeutic activities;Therapeutic exercise;Balance training;Patient/family education    PT Goals (Current goals can be found in the Care Plan section)  Acute Rehab PT Goals Patient Stated Goal: return home PT Goal Formulation: With patient Time For Goal Achievement: 06/05/18 Potential to Achieve Goals: Good    Frequency Min  2X/week   Barriers to discharge        Co-evaluation               AM-PAC PT "6 Clicks" Mobility  Outcome Measure Help needed turning from your back to your side while in a flat bed without using bedrails?: None Help needed moving from lying on your back to sitting on the side of a flat bed without using bedrails?: None Help needed moving to and from a bed to a chair (including a wheelchair)?: A Little Help needed standing up from a chair using your arms (e.g., wheelchair or bedside chair)?: A Little Help needed to walk in hospital room?: A Little Help needed climbing 3-5 steps with a railing? : A Lot 6 Click Score: 19    End of Session Equipment Utilized During Treatment: Gait belt Activity Tolerance: Patient tolerated treatment well;Patient limited by fatigue Patient left: in bed;with call bell/phone within reach;with bed alarm set Nurse Communication: Mobility status PT Visit Diagnosis: Unsteadiness on feet (R26.81);Other abnormalities of gait and mobility (R26.89);Muscle weakness (generalized) (M62.81)    Time: 1021-1173 PT Time Calculation (min) (ACUTE ONLY): 16 min   Charges:   PT Evaluation $PT Eval Low Complexity: 1 Low PT Treatments $Gait Training: 8-22 mins        12:14 PM, 05/29/18 Domenick Bookbinder, DPT Physical Therapist with [facility] [phone] office

## 2018-05-29 NOTE — Care Management Note (Addendum)
Case Management Note  Patient Details  Name: [name] MRN: [id number] Date of Birth: 1946/03/20  Subjective/Objective:         Generalized weakness, hip pain. From home , independent. Recommended for [facility] PT and RW, agreeable. No preference on providers. CMS list provided.                       Action/Plan: Referral given to [facility] for DME and [facility] home [facility] for home health PT.   Expected Discharge Date:   05/30/18               Expected Discharge Plan:  Home w Home Health Services  In-House Referral:     Discharge planning Services  CM Consult  Post Acute Care Choice:  Home Health, Durable Medical Equipment Choice offered to:     DME Arranged:  Walker rolling DME Agency:  [facility]  HH Arranged:  PT [facility] Agency:  [facility] home health  Status of Service:  Completed, signed off  If discussed at Long Length of Stay Meetings, dates discussed:    Additional Comments:  Ariah Mower, Chrystine Oiler, RN 05/29/2018, 1:54 PM

## 2018-05-29 NOTE — Progress Notes (Signed)
Spoke with Dr. Margo Aye regarding ABI. Stated would need nursing staff to find out from [facility] ultrasound department which order to place in [facility] for [facility]. Spoke with ultrasound tech and order successfully placed as received from MD. ABI completed this afternoon. Patient and daughter aware MD will have to give them results. Earnstine Regal, RN

## 2018-05-29 NOTE — Progress Notes (Signed)
Patient ID: [name], female   DOB: [birth year], 73 y.o.   MRN: [id number] I discussed the case regarding Ms. Axelrod with the hospitalist at [facility] this morning.  I had been called by the ER provider on Thursday morning around 1 AM.  Patient was visiting her mother in the emergency department and had a fall.  She was then worked up herself.  There was concern regarding some discoloration in her left toes.  She had a remote history of aortobifemoral bypass by Dr. Hart Rochester approximately 15 years ago by history.  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  There was no suggestion of acute occlusion by history and certainly by the CT scan.  I explained that this was a chronic issue and would be willing to see the patient as an outpatient for follow-up if desired.  Did not see any indication for transfer to [facility] from a vascular standpoint.  I was called by the hospitalist this morning to discuss and reiterated this.  Do not see any indication for transfer.  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  We will see her as an inpatient if she is transferred to [facility].  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

## 2018-05-29 NOTE — Progress Notes (Signed)
Late entry for 1430. Patient's daughter [name] at bedside stated she would like to speak with MD for an update if possible. Text-paged Dr. Margo Aye to notify her. Patient's daughter then expressed concerns that she was confused and not answering her questions appropriately during visit today. Patient alert and oriented x 4 and answered nurse's questions appropriately. Neuro assessment within defined limits except for c/o numbness/tingling to right lower leg/foot. Reports decreased sensation to top of right foot and unable to wiggle toes. Able to move right leg. See assessment flowsheet. Vitals obtained, see flowsheet. Notified Dr. Margo Aye. States will try to call daughter with update later this evening. Did not feel CT of head was warranted at this time since patient is alert and oriented x 4 and no other neuro assessment abnormalities. States will order ABI for right lower extremity. Notified patient and daughter at bedside. Encouraged to notify nursing staff of any further questions/concerns. Earnstine Regal, RN

## 2018-05-29 NOTE — Progress Notes (Signed)
CRITICAL VALUE ALERT  Critical Value:  Lactic 2.0  Date & Time Notied:  05/29/18 10:12 AM  Provider Notified: Dow Adolph, DO   Orders Received/Actions taken: No new orders at this time.

## 2018-05-29 NOTE — Progress Notes (Signed)
Notified Dr. Margo Aye of ABI completed, states she has reviewed results. No new orders at this time. Aware patient c/o persistent pain to right lower extremity, poor sensation, cool to touch. Peripheral pulses present. Fentanyl as ordered PRN pain. Stated okay. Pt will follow-up with Vascular at discharge. Patient requested to have spinal stimulator turned on to see if it would decrease pain. Discussed with Dr. Margo Aye. States patient may turn it on during hospital stay. Patient states will have her daughter bring the remote and charger. Earnstine Regal, RN

## 2018-05-29 NOTE — Progress Notes (Addendum)
PROGRESS NOTE  [name]:[id number] DOB: May 29, 1945 DOA: 05/27/2018 PCP: Junie Spencer, [name]  HPI/Recap of past 24 hours: [name] a 73 y.o.femalewith medical history significant ofchronic back pain, GERD, hyperlipidemia, hypertension, left knee osteoarthritis, palpitations, PVD, history of tobacco use whois coming to the emergency department after having a fall in 1 of the rooms of the emergency department while she was visiting her mother who was brought to the ED earlier.She complains of bilateral leg pain after fall, but stated that she has been having pain at home as well. She states that she has been very weak lately. She denies fever, chills, sore throat, rhinorrhea. However, she says she has been having wheezing and coughing. Denies hemoptysis. No chest pain, palpitations, dizziness or diaphoresis, PND, orthopnea or pitting edema of the lower extremities. She complains of occasional back pain that comes around to her epigastric area. She gets occasional nausea, but denies emesis, diarrhea, constipation, melena or hematochezia. No dysuria, frequency or hematuria. No polyuria, polydipsia, polyphagia or blurred vision.  05/29/18: seen and examined at her bedside. She is asking to smoke a couple of cigarettes. Added nicotine gum for her cravings. Later in the afternoon reported to RN numbness and tingling in her RLE also cold extremity. ABI ordered and pending.   Assessment/Plan: Principal Problem:   General weakness Active Problems:   Hyperlipidemia with target LDL less than 100   TOBACCO ABUSE   Essential hypertension   Peripheral vascular disease (HCC)   GERD   Depression   Fall   Pancreatitis  Generalized weakness and fall Presented post fall with generalized weakness Suspect multifactorial 2/2 PVD vs ?pancreatitis vs others PT recs [facility] PT Fall precautions Carotic duplex U/S unremarkable for any significant  stenosis  ?pancreatitis Findings on CT abd and pelvis Lipase 51 Advance diet as tolerated  Persistent leukocytosis Wbc 14K from 14K So far no sign of active infective process Blood cx x 2 and urine cx pending Obtain CBC w diff in the am  RLE pain in the setting of PAD Advised to quit smoking, declines Nicotine patch and nicotine gum ABI RLE pending   Hyperlipidemia with target LDL less than 100 Continue fenofibrate 160 mg p.o. daily. Continue atorvastatin 40 mg p.o. daily    TOBACCO ABUSE Smoking cessation advised. Staff to provide tobacco cessation information. Nicotine replacement therapy ordered.    COPD Supplemental oxygen as needed. Start budesonide nebs every 12 hours. DuoNeb every 4 hours as needed.    Uncontrolled Essential hypertension Continue lisinopril 20 mg p.o. daily Add amlodipine 5 mg daily    GERD Protonix 40 mg p.o. daily.    Depression Continue Cymbalta 60 mg p.o. daily.    Fall Continue analgesics as needed. Continue Mobic 15 mg p.o. daily.  Will not treat UTI since this seems to be asymptomatic bacteriuria.     DVT prophylaxis: Lovenox SQ. Code Status: Full code. Family Communication:  Disposition Plan: Observation for IV hydration, IV antibiotics, symptoms Rx and further work-up. Consults called: Discussed with DR Early who will see her outpatient on Monday  Objective: Vitals:   05/29/18 0912 05/29/18 1029 05/29/18 1118 05/29/18 1439  BP:  (!) 198/69 (!) 189/75 (!) 176/80  Pulse:  70 64 74  Resp:    18  Temp:  98 F (36.7 C)  98 F (36.7 C)  TempSrc:  Oral  Oral  SpO2: 93% 97%  93%  Weight:      Height:  Intake/Output Summary (Last 24 hours) at 05/29/2018 1711 Last data filed at 05/29/2018 1654 Gross per 24 hour  Intake 2576.84 ml  Output 950 ml  Net 1626.84 ml   Filed Weights   05/27/18 1518  Weight: 76.2 kg    Exam:  . General: 73 y.o. year-old female well developed well nourished in no acute  distress.  Alert and oriented x3. . Cardiovascular: Regular rate and rhythm with no rubs or gallops.  No thyromegaly or JVD noted.   Marland Kitchen Respiratory: Clear to auscultation with no wheezes or rales. Good inspiratory effort. . Abdomen: Soft nontender nondistended with normal bowel sounds x4 quadrants. . Musculoskeletal: No lower extremity edema. 2/4 pulses in all 4 extremities. Marland Kitchen Psychiatry: Mood is appropriate for condition and setting   Data Reviewed: CBC: Recent Labs  Lab 05/27/18 1748 05/28/18 0556 05/29/18 0541  WBC 12.3* 14.0* 14.2*  NEUTROABS 9.6* 9.4*  --   HGB 11.9* 11.6* 12.0  HCT 39.8 38.7 39.4  MCV 86.0 85.4 84.5  PLT 342 361 393   Basic Metabolic Panel: Recent Labs  Lab 05/27/18 1748 05/28/18 0556 05/29/18 0541  NA 136 138 135  K 3.5 4.1 3.5  CL 103 105 100  CO2 23 25 23   GLUCOSE 132* 115* 132*  BUN 14 11 8   CREATININE 1.08* 0.91 0.63  CALCIUM 8.8* 8.7* 9.2   GFR: Estimated Creatinine Clearance: 65.1 mL/min (by C-G formula based on SCr of 0.63 mg/dL). Liver Function Tests: Recent Labs  Lab 05/27/18 1748 05/28/18 0556 05/29/18 0541  AST 22 33 141*  ALT 10 11 36  ALKPHOS 99 89 99  BILITOT 0.5 0.2* 0.7  PROT 7.4 6.8 7.6  ALBUMIN 3.2* 3.0* 3.2*   Recent Labs  Lab 05/27/18 1748 05/28/18 0556 05/29/18 0541  LIPASE 56* 51 50   No results for input(s): AMMONIA in the last 168 hours. Coagulation Profile: No results for input(s): INR, PROTIME in the last 168 hours. Cardiac Enzymes: Recent Labs  Lab 05/27/18 1748  TROPONINI <0.03   BNP (last 3 results) No results for input(s): PROBNP in the last 8760 hours. HbA1C: No results for input(s): HGBA1C in the last 72 hours. CBG: No results for input(s): GLUCAP in the last 168 hours. Lipid Profile: No results for input(s): CHOL, HDL, LDLCALC, TRIG, CHOLHDL, LDLDIRECT in the last 72 hours. Thyroid Function Tests: No results for input(s): TSH, T4TOTAL, FREET4, T3FREE, THYROIDAB in the last 72  hours. Anemia Panel: No results for input(s): VITAMINB12, FOLATE, FERRITIN, TIBC, IRON, RETICCTPCT in the last 72 hours. Urine analysis:    Component Value Date/Time   COLORURINE YELLOW 05/27/2018 1900   APPEARANCEUR CLOUDY (A) 05/27/2018 1900   LABSPEC 1.008 05/27/2018 1900   PHURINE 5.0 05/27/2018 1900   GLUCOSEU NEGATIVE 05/27/2018 1900   HGBUR SMALL (A) 05/27/2018 1900   BILIRUBINUR NEGATIVE 05/27/2018 1900   KETONESUR NEGATIVE 05/27/2018 1900   PROTEINUR NEGATIVE 05/27/2018 1900   NITRITE NEGATIVE 05/27/2018 1900   LEUKOCYTESUR NEGATIVE 05/27/2018 1900   Sepsis Labs: @LABRCNTIP (procalcitonin:4,lacticidven:4)  ) Recent Results (from the past 240 hour(s))  Culture, blood (routine x 2)     Status: None (Preliminary result)   Collection Time: 05/29/18  8:48 AM  Result Value Ref Range Status   Specimen Description   Final    LEFT ANTECUBITAL BOTTLES DRAWN AEROBIC AND ANAEROBIC   Special Requests   Final    Blood Culture adequate volume Performed at [facility], [address] [place], [place] [address]    Culture  PENDING  Incomplete   Report Status PENDING  Incomplete  Culture, blood (routine x 2)     Status: None (Preliminary result)   Collection Time: 05/29/18  8:48 AM  Result Value Ref Range Status   Specimen Description   Final    BLOOD LEFT HAND BOTTLES DRAWN AEROBIC AND ANAEROBIC   Special Requests   Final    Blood Culture adequate volume Performed at [facility], [address] [place], [place] [address]    Culture PENDING  Incomplete   Report Status PENDING  Incomplete      Studies: No results found.  Scheduled Meds: . atorvastatin  40 mg Oral Daily  . budesonide (PULMICORT) nebulizer solution  0.25 mg Nebulization BID  . DULoxetine  60 mg Oral Daily  . enoxaparin (LOVENOX) injection  40 mg Subcutaneous Q24H  . fenofibrate  160 mg Oral Daily  . lisinopril  20 mg Oral Daily  . meloxicam  15 mg Oral Daily  . nicotine  21 mg Transdermal Daily  .  pantoprazole  40 mg Oral Daily  . pregabalin  200 mg Oral TID    Continuous Infusions: . lactated ringers Stopped (05/29/18 1437)     LOS: 0 days     Darlin Drop, MD [facility] Pager [phone]  If 7PM-7AM, please contact night-coverage [email or web address] Password [facility] 05/29/2018, 5:11 PM

## 2018-05-30 ENCOUNTER — Inpatient Hospital Stay (HOSPITAL_COMMUNITY): Payer: Medicare Other

## 2018-05-30 LAB — CBC WITH DIFFERENTIAL/PLATELET
Abs Immature Granulocytes: 0.07 10*3/uL (ref 0.00–0.07)
BASOS PCT: 1 %
Basophils Absolute: 0.1 10*3/uL (ref 0.0–0.1)
Eosinophils Absolute: 0.1 10*3/uL (ref 0.0–0.5)
Eosinophils Relative: 1 %
HCT: 36.7 % (ref 36.0–46.0)
Hemoglobin: 11.5 g/dL — ABNORMAL LOW (ref 12.0–15.0)
Immature Granulocytes: 1 %
Lymphocytes Relative: 14 %
Lymphs Abs: 1.7 10*3/uL (ref 0.7–4.0)
MCH: 26.7 pg (ref 26.0–34.0)
MCHC: 31.3 g/dL (ref 30.0–36.0)
MCV: 85.3 fL (ref 80.0–100.0)
Monocytes Absolute: 1.4 10*3/uL — ABNORMAL HIGH (ref 0.1–1.0)
Monocytes Relative: 11 %
Neutro Abs: 8.5 10*3/uL — ABNORMAL HIGH (ref 1.7–7.7)
Neutrophils Relative %: 72 %
Platelets: 392 10*3/uL (ref 150–400)
RBC: 4.3 MIL/uL (ref 3.87–5.11)
RDW: 15.2 % (ref 11.5–15.5)
WBC: 11.8 10*3/uL — AB (ref 4.0–10.5)
nRBC: 0 % (ref 0.0–0.2)

## 2018-05-30 LAB — MAGNESIUM: Magnesium: 2 mg/dL (ref 1.7–2.4)

## 2018-05-30 LAB — URINE CULTURE: Culture: NO GROWTH

## 2018-05-30 LAB — BASIC METABOLIC PANEL
Anion gap: 11 (ref 5–15)
BUN: 9 mg/dL (ref 8–23)
CO2: 25 mmol/L (ref 22–32)
CREATININE: 0.61 mg/dL (ref 0.44–1.00)
Calcium: 8.9 mg/dL (ref 8.9–10.3)
Chloride: 95 mmol/L — ABNORMAL LOW (ref 98–111)
GFR calc non Af Amer: >60 mL/min (ref >60)
Glucose, Bld: 111 mg/dL — ABNORMAL HIGH (ref 70–99)
Potassium: 3.6 mmol/L (ref 3.5–5.1)
Sodium: 131 mmol/L — ABNORMAL LOW (ref 135–145)

## 2018-05-30 LAB — PHOSPHORUS: Phosphorus: 3 mg/dL (ref 2.5–4.6)

## 2018-05-30 LAB — LIPASE, BLOOD: Lipase: 59 U/L — ABNORMAL HIGH (ref 11–51)

## 2018-05-30 MED ORDER — MELOXICAM 15 MG PO TABS: 15.0000 mg | ORAL_TABLET | Freq: Every day | ORAL | 0 refills | Status: DC

## 2018-05-30 MED ORDER — AMLODIPINE BESYLATE 10 MG PO TABS
10.0000 mg | ORAL_TABLET | Freq: Every day | ORAL | Status: DC
  Administered 2018-05-30 – 2018-06-08 (×9): 10 mg via ORAL
  Filled 2018-05-30 (×4): qty 1
  Filled 2018-05-30: qty 2
  Filled 2018-05-30 (×4): qty 1

## 2018-05-30 MED ORDER — CEPHALEXIN 500 MG PO CAPS: 500.0000 mg | ORAL_CAPSULE | Freq: Four times a day (QID) | ORAL | 0 refills | Status: DC

## 2018-05-30 MED ORDER — CEPHALEXIN 500 MG PO CAPS
500.0000 mg | ORAL_CAPSULE | Freq: Four times a day (QID) | ORAL | Status: DC
  Administered 2018-05-30 – 2018-05-31 (×5): 500 mg via ORAL
  Filled 2018-05-30 (×5): qty 1

## 2018-05-30 MED ORDER — LISINOPRIL 40 MG PO TABS: 40.0000 mg | ORAL_TABLET | Freq: Every day | ORAL | Status: DC

## 2018-05-30 MED ORDER — LIDOCAINE 5 % EX PTCH
2.0000 | MEDICATED_PATCH | CUTANEOUS | Status: DC
  Administered 2018-05-30 – 2018-06-07 (×8): 2 via TRANSDERMAL
  Filled 2018-05-30 (×10): qty 2

## 2018-05-30 MED ORDER — LISINOPRIL 10 MG PO TABS
20.0000 mg | ORAL_TABLET | Freq: Once | ORAL | Status: AC
  Administered 2018-05-30: 20 mg via ORAL
  Filled 2018-05-30: qty 2

## 2018-05-30 MED ORDER — AMLODIPINE BESYLATE 10 MG PO TABS: 10.0000 mg | ORAL_TABLET | Freq: Every day | ORAL | 0 refills | Status: DC

## 2018-05-30 MED ORDER — TRAMADOL HCL 50 MG PO TABS
50.0000 mg | ORAL_TABLET | Freq: Once | ORAL | Status: AC
  Administered 2018-05-30: 50 mg via ORAL
  Filled 2018-05-30: qty 1

## 2018-05-30 MED ORDER — NICOTINE 21 MG/24HR TD PT24: 21.0000 mg | MEDICATED_PATCH | Freq: Every day | TRANSDERMAL | 0 refills | Status: DC

## 2018-05-30 NOTE — Progress Notes (Addendum)
PROGRESS NOTE  [name] ZOX:[id number] DOB: 1946/01/10 DOA: 05/27/2018 PCP: Junie Spencer, [name]  HPI/Recap of past 24 hours: [name] a 73 y.o.femalewith medical history significant ofchronic back pain (has a spinal stimulator placed in 2 years ago), GERD, hyperlipidemia, hypertension, left knee osteoarthritis, palpitations, PVD, ongoing tobacco abuse whois coming to the emergency department after having a fall in 1 of the rooms of the emergency department while she was visiting her mother who was brought to the ED earlier.She complains of bilateral leg pain after fall, but stated that she has been having pain at home as well. She states that she has been very weak lately. She denies fever, chills, sore throat, rhinorrhea. However, she says she has been having wheezing and coughing. Denies hemoptysis. No chest pain, palpitations, dizziness or diaphoresis, PND, orthopnea or pitting edema of the lower extremities. She complains of occasional back pain that comes around to her epigastric area. She gets occasional nausea, but denies emesis, diarrhea, constipation, melena or hematochezia. No dysuria, frequency or hematuria. No polyuria, polydipsia, polyphagia or blurred vision.  05/30/18: Patient seen and examined with her daughter at bedside.  Reports significant pain in her right lower extremity preventing her from walking.  Daughter is concerned due to her previous independence.  Discussed with Dr. Arbie Cookey on 05/29/2018 who recommended follow-up in the office on Monday.  Patient unable to freely ambulate.  CT head unremarkable for any acute intracranial abnormality.  Paged vascular surgery on call Dr. Randie Heinz.  We will transfer the patient to [facility].  Patient will need vascular surgery consultation once she arrives.   Assessment/Plan: Principal Problem:   General weakness Active Problems:   Hyperlipidemia with target LDL less than 100   TOBACCO ABUSE  Essential hypertension   Peripheral vascular disease (HCC)   GERD   Depression   Fall   Pancreatitis  Generalized bilateral lower extremity weakness, worse on R, intractable pain, and fall Presented post fall  Suspect multifactorial 2/2 severe PAD vs others  PT recs HH PT Fall precautions CT head no contrast unremarkable for any acute intracranial findings Carotic duplex U/S unremarkable for any significant stenosis  Severe RLE pain in the setting of PAD CT ANGIOGRAPHY OF ABDOMINAL AORTA WITH ILIOFEMORAL 05/27/18:  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 There is complete occlusion of the right iliac limb of the bypass graft with reconstitution of flow to in the right common femoral artery. 2. Focal area of narrowing of the left common femoral artery. 3. Patent bilateral superficial and deep femoral arteries as well as patent appearance of the calf arteries. 4. 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  ABI: 1. Significant depression of the resting right ankle-brachial index with monophasic posterior tibial waveform and barely detectable Doppler signal in the dorsalis pedis artery. Findings are consistent with the known occlusive disease of the right aortobifemoral bypass graft limb. The runoff study did not demonstrate otherwise significant occlusive disease of other vessels in the right lower extremity after reconstitution of the femoral bifurcation. 2. Near normal resting ankle-brachial index on the left of 0.92. The CTA does demonstrate stenosis the common femoral artery on the left.  Advised to quit smoking Continue nicotine patch C/w lyrica, cymbalta, and mobic Added Lidoderm patch C/w statin, fenofibrate Not on asa, defer to vasc surgery  May consider antiplatelet if no procedure planned  Uncontrolled hypertension Suspect contributed by pain At home on  lisinopril 20 mg daily Added  amlodipine 10 mg We will increase lisinopril to 40 mg daily to control her BP  Chronic back pain post spinal stimulator placement Self reported spinal stimulator placed about 2 years ago Currently in use;   States pain is mostly in her R leg Self reported spinal stimulator is not compatible to MRI Last CT lumbar spine in 2012 Consider repeating imaging if complaints of lower back pain  ?pancreatitis Findings on CT abd and pelvis Asymptomatic Tolerating a regular consistency diet Repeat lipase today 59 from 50 yesterday  Leukocytosis possible secondary to chronic left-sphenoid sinusitis Wbc trending down to 11.8 from 14K  Keflex for chronic sinusitis Blood cx x 2 negative to date  Hyperlipidemia with target LDL less than 100 Continue fenofibrate 160 mg p.o. daily. Continue atorvastatin 40 mg p.o. daily  TOBACCO ABUSE, ongoing Smoking cessation advised. Staff to provide tobacco cessation information. Nicotine replacement therapy ordered.   COPD Supplemental oxygen as needed. Continue budesonide nebs every 12 hours. DuoNeb every 4 hours as needed.   GERD Protonix 40 mg p.o. daily.   Depression Continue Cymbalta 60 mg p.o. daily.   Fall Continue analgesics as needed. Continue Mobic 15 mg p.o. daily.  Asymptomatic pyuria Denies dysuria and suprapubic pain Will not treat since she is asymptomatic     DVT prophylaxis: SQ Lovenox SQ. Code Status: Full code.  Family Communication: Daughter at bedside. All questions answered to her satisfaction. Family agrees with transfer to [facility]. Disposition Plan: Transfer to [facility] for evaluation by vascular surgery  Consults called: Discussed with Dr Randie Heinz who will see at [facility]. Please contact vascular surgery once patient arrives.  Objective: Vitals:   05/30/18 0612 05/30/18 0941 05/30/18 1128 05/30/18 1309  BP: (!) 161/71 (!) 187/77  (!) 156/62  Pulse: 84 83  80  Resp: 19 19    Temp: 97.9 F (36.6 C)  98.5 F (36.9 C)    TempSrc: Oral Oral  Oral  SpO2: 99% (!) 89% 95% 92%  Weight:      Height:        Intake/Output Summary (Last 24 hours) at 05/30/2018 1624 Last data filed at 05/30/2018 1300 Gross per 24 hour  Intake 3525.25 ml  Output 2050 ml  Net 1475.25 ml   Filed Weights   05/27/18 1518  Weight: 76.2 kg    Exam:  . General: 73 y.o. year-old female developed well-nourished appears uncomfortable due to severe right lower extremity pain.  Alert and oriented x3. . Cardiovascular: Regular rate and rhythm with no rubs or gallops.  No JVD or thyromegaly. Marland Kitchen Respiratory: Clear to auscultation with no wheezes or rales.  Good inspiratory effort. . Abdomen: Soft nontender nondistended with normal bowel sounds x4 quadrants. . Musculoskeletal: Right lower extremity from the knee down with minimal sensation. Marland Kitchen Psychiatry: Mood is appropriate for condition and setting   Data Reviewed: CBC: Recent Labs  Lab 05/27/18 1748 05/28/18 0556 05/29/18 0541 05/30/18 0512  WBC 12.3* 14.0* 14.2* 11.8*  NEUTROABS 9.6* 9.4*  --  8.5*  HGB 11.9* 11.6* 12.0 11.5*  HCT 39.8 38.7 39.4 36.7  MCV 86.0 85.4 84.5 85.3  PLT 342 361 393 392   Basic Metabolic Panel: Recent Labs  Lab 05/27/18 1748 05/28/18 0556 05/29/18 0541 05/30/18 0512  NA 136 138 135 131*  K 3.5 4.1 3.5 3.6  CL 103 105 100 95*  CO2 23 25 23 25   GLUCOSE 132* 115* 132* 111*  BUN 14 11 8  9  CREATININE 1.08* 0.91 0.63 0.61  CALCIUM 8.8* 8.7* 9.2 8.9  MG  --   --   --  2.0  PHOS  --   --   --  3.0   GFR: Estimated Creatinine Clearance: 65.1 mL/min (by C-G formula based on SCr of 0.61 mg/dL). Liver Function Tests: Recent Labs  Lab 05/27/18 1748 05/28/18 0556 05/29/18 0541  AST 22 33 141*  ALT 10 11 36  ALKPHOS 99 89 99  BILITOT 0.5 0.2* 0.7  PROT 7.4 6.8 7.6  ALBUMIN 3.2* 3.0* 3.2*   Recent Labs  Lab 05/27/18 1748 05/28/18 0556 05/29/18 0541 05/30/18 0512  LIPASE 56* 51 50 59*   No results for input(s):  AMMONIA in the last 168 hours. Coagulation Profile: No results for input(s): INR, PROTIME in the last 168 hours. Cardiac Enzymes: Recent Labs  Lab 05/27/18 1748  TROPONINI <0.03   BNP (last 3 results) No results for input(s): PROBNP in the last 8760 hours. HbA1C: No results for input(s): HGBA1C in the last 72 hours. CBG: No results for input(s): GLUCAP in the last 168 hours. Lipid Profile: No results for input(s): CHOL, HDL, LDLCALC, TRIG, CHOLHDL, LDLDIRECT in the last 72 hours. Thyroid Function Tests: No results for input(s): TSH, T4TOTAL, FREET4, T3FREE, THYROIDAB in the last 72 hours. Anemia Panel: No results for input(s): VITAMINB12, FOLATE, FERRITIN, TIBC, IRON, RETICCTPCT in the last 72 hours. Urine analysis:    Component Value Date/Time   COLORURINE YELLOW 05/27/2018 1900   APPEARANCEUR CLOUDY (A) 05/27/2018 1900   LABSPEC 1.008 05/27/2018 1900   PHURINE 5.0 05/27/2018 1900   GLUCOSEU NEGATIVE 05/27/2018 1900   HGBUR SMALL (A) 05/27/2018 1900   BILIRUBINUR NEGATIVE 05/27/2018 1900   KETONESUR NEGATIVE 05/27/2018 1900   PROTEINUR NEGATIVE 05/27/2018 1900   NITRITE NEGATIVE 05/27/2018 1900   LEUKOCYTESUR NEGATIVE 05/27/2018 1900   Sepsis Labs: (procalcitonin:4,lacticidven:4)  ) Recent Results (from the past 240 hour(s))  Culture, blood (routine x 2)     Status: None (Preliminary result)   Collection Time: 05/29/18  8:48 AM  Result Value Ref Range Status   Specimen Description   Final    LEFT ANTECUBITAL BOTTLES DRAWN AEROBIC AND ANAEROBIC   Special Requests Blood Culture adequate volume  Final   Culture   Final    NO GROWTH < 24 HOURS Performed at [facility], [address] [place], [place] [address]    Report Status PENDING  Incomplete  Culture, blood (routine x 2)     Status: None (Preliminary result)   Collection Time: 05/29/18  8:48 AM  Result Value Ref Range Status   Specimen Description   Final    BLOOD LEFT HAND BOTTLES DRAWN AEROBIC  AND ANAEROBIC   Special Requests Blood Culture adequate volume  Final   Culture   Final    NO GROWTH < 24 HOURS Performed at [facility], [address], [place], [place] [address]    Report Status PENDING  Incomplete  Culture, Urine     Status: None   Collection Time: 05/29/18  2:29 PM  Result Value Ref Range Status   Specimen Description   Final    URINE, CLEAN CATCH Performed at [facility], [address] [place], [place] [address]    Special Requests   Final    NONE Performed at [facility], [address], [place], [place] [address]    Culture   Final    NO GROWTH Performed at [facility], [address], [place], [place] [address]    Report Status 05/30/2018 FINAL  Final      Studies: Ct Head Wo Contrast  Result Date: 05/30/2018 CLINICAL DATA:  73 year old female with acute RIGHT leg pain and numbness. Fall yesterday. EXAM: CT HEAD WITHOUT CONTRAST TECHNIQUE: Contiguous axial images were obtained from the base of the skull through the vertex without intravenous contrast. COMPARISON:  05/27/2018 CT FINDINGS: Brain: No evidence of acute infarction, hemorrhage, hydrocephalus, extra-axial collection or mass lesion/mass effect. 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 Vascular: Carotid atherosclerotic calcifications again noted. Skull: Normal. Negative for fracture or focal lesion. Sinuses/Orbits: No acute finding. Chronic LEFT sphenoid sinusitis noted. Other: None. IMPRESSION: 1. No evidence of acute intracranial abnormality. 2. 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 Chronic LEFT sphenoid sinusitis. Electronically Signed   By: Harmon Pier M.D.   On: 05/30/2018 13:58   US Arterial Abi (screening Lower Extremity)  Result Date: 05/29/2018 CLINICAL DATA:  Right lower extremity numbness, pain and tingling. CTA  evidence of chronic occlusion of the right limb of an aortobifemoral bypass graft with reconstituted flow at the femoral bifurcation. EXAM: NONINVASIVE PHYSIOLOGIC VASCULAR STUDY OF BILATERAL LOWER EXTREMITIES TECHNIQUE: Evaluation of both lower extremities were performed at rest, including calculation of ankle-brachial indices with single level Doppler, pressure and pulse volume recording. COMPARISON:  CTA of the abdominal aorta with bilateral runoff on 05/27/2018 FINDINGS: Right ABI:  0.15 Left ABI:  0.92 Right Lower Extremity: The posterior tibial waveform is depressed and monophasic. There is barely detectable flow in the dorsalis pedis artery by Doppler. Left Lower Extremity: Posterior tibial waveform is biphasic in the dorsalis pedis waveform triphasic. IMPRESSION: 1. Significant depression of the resting right ankle-brachial index with monophasic posterior tibial waveform and barely detectable Doppler signal in the dorsalis pedis artery. Findings are consistent with the known occlusive disease of the right aortobifemoral bypass graft limb. The runoff study did not demonstrate otherwise significant occlusive disease of other vessels in the right lower extremity after reconstitution of the femoral bifurcation. 2. Near normal resting ankle-brachial index on the left of 0.92. The CTA does demonstrate stenosis the common femoral artery on the left. Electronically Signed   By: Irish Lack M.D.   On: 05/29/2018 17:12    Scheduled Meds: . amLODipine  10 mg Oral Daily  . atorvastatin  40 mg Oral Daily  . budesonide (PULMICORT) nebulizer solution  0.25 mg Nebulization BID  . cephALEXin  500 mg Oral Q6H  . DULoxetine  60 mg Oral Daily  . enoxaparin (LOVENOX) injection  40 mg Subcutaneous Q24H  . fenofibrate  160 mg Oral Daily  . lidocaine  2 patch Transdermal Q24H  . [START ON 05/31/2018] lisinopril  40 mg Oral Daily  . meloxicam  15 mg Oral Daily  . nicotine  21 mg Transdermal Daily  . pantoprazole  40  mg Oral Daily  . pregabalin  200 mg Oral TID    Continuous Infusions:    LOS: 1 day     Darlin Drop, MD [facility] Pager [phone]  If 7PM-7AM, please contact night-coverage [email or web address] Password [facility] 05/30/2018, 4:24 PM

## 2018-05-30 NOTE — Progress Notes (Signed)
Report called to [facility], RN at [facility] and all questions answered.  Patient awaiting transport to [facility] by [facility].  Patient's family aware patient to be transferred to [facility].  Patient to be transferred to room 3 on [address].

## 2018-05-30 NOTE — Progress Notes (Signed)
Pt discharged via stretcher with carelink at this time in stable condition. Report given to nurse at [facility] by previous nurse. Pt left in stable condition, no acute distress noted.

## 2018-05-31 ENCOUNTER — Inpatient Hospital Stay (HOSPITAL_COMMUNITY): Payer: Medicare Other | Admitting: Certified Registered"

## 2018-05-31 ENCOUNTER — Encounter (HOSPITAL_COMMUNITY)
Admission: EM | Disposition: A | Discharge status: Skilled Nursing Facility | Payer: Self-pay | Source: Home / Self Care | Attending: Internal Medicine

## 2018-05-31 ENCOUNTER — Encounter (HOSPITAL_COMMUNITY): Payer: Medicare Other

## 2018-05-31 DIAGNOSIS — I75021 Atheroembolism of right lower extremity: Secondary | ICD-10-CM

## 2018-05-31 HISTORY — PX: PATCH ANGIOPLASTY: SHX6230

## 2018-05-31 HISTORY — PX: THROMBECTOMY FEMORAL ARTERY: SHX6406

## 2018-05-31 LAB — HEPARIN LEVEL (UNFRACTIONATED): Heparin Unfractionated: 0.41 IU/mL (ref 0.30–0.70)

## 2018-05-31 SURGERY — THROMBECTOMY, ARTERY, FEMORAL
Anesthesia: General | Laterality: Right

## 2018-05-31 MED ORDER — GUAIFENESIN-DM 100-10 MG/5ML PO SYRP: 15.0000 mL | ORAL_SOLUTION | ORAL | Status: DC | PRN

## 2018-05-31 MED ORDER — ONDANSETRON HCL 4 MG/2ML IJ SOLN
4.0000 mg | Freq: Four times a day (QID) | INTRAMUSCULAR | Status: DC | PRN
  Administered 2018-05-31: 4 mg via INTRAVENOUS
  Filled 2018-05-31: qty 2

## 2018-05-31 MED ORDER — FENTANYL CITRATE (PF) 250 MCG/5ML IJ SOLN
INTRAMUSCULAR | Status: DC | PRN
  Administered 2018-05-31 (×2): 50 ug via INTRAVENOUS

## 2018-05-31 MED ORDER — PANTOPRAZOLE SODIUM 40 MG PO TBEC: 40.0000 mg | DELAYED_RELEASE_TABLET | Freq: Every day | ORAL | Status: DC

## 2018-05-31 MED ORDER — PHENYLEPHRINE 40 MCG/ML (10ML) SYRINGE FOR IV PUSH (FOR BLOOD PRESSURE SUPPORT)
PREFILLED_SYRINGE | INTRAVENOUS | Status: AC
  Filled 2018-05-31: qty 10

## 2018-05-31 MED ORDER — CEFAZOLIN SODIUM-DEXTROSE 2-4 GM/100ML-% IV SOLN
2.0000 g | INTRAVENOUS | Status: AC
  Administered 2018-05-31: 2 g via INTRAVENOUS
  Filled 2018-05-31: qty 100

## 2018-05-31 MED ORDER — PHENYLEPHRINE 40 MCG/ML (10ML) SYRINGE FOR IV PUSH (FOR BLOOD PRESSURE SUPPORT)
PREFILLED_SYRINGE | INTRAVENOUS | Status: DC | PRN
  Administered 2018-05-31: 80 ug via INTRAVENOUS
  Administered 2018-05-31: 40 ug via INTRAVENOUS
  Administered 2018-05-31: 120 ug via INTRAVENOUS

## 2018-05-31 MED ORDER — DEXAMETHASONE SODIUM PHOSPHATE 10 MG/ML IJ SOLN
INTRAMUSCULAR | Status: DC | PRN
  Administered 2018-05-31: 10 mg via INTRAVENOUS

## 2018-05-31 MED ORDER — MAGNESIUM SULFATE 2 GM/50ML IV SOLN: 2.0000 g | Freq: Every day | INTRAVENOUS | Status: DC | PRN

## 2018-05-31 MED ORDER — ACETAMINOPHEN 325 MG PO TABS
325.0000 mg | ORAL_TABLET | ORAL | Status: DC | PRN
  Administered 2018-06-02: 650 mg via ORAL
  Filled 2018-05-31: qty 2

## 2018-05-31 MED ORDER — PROMETHAZINE HCL 25 MG/ML IJ SOLN
6.2500 mg | INTRAMUSCULAR | Status: DC | PRN
  Administered 2018-05-31: 6.25 mg via INTRAVENOUS

## 2018-05-31 MED ORDER — LIDOCAINE 2% (20 MG/ML) 5 ML SYRINGE
INTRAMUSCULAR | Status: AC
  Filled 2018-05-31: qty 5

## 2018-05-31 MED ORDER — THROMBIN 5000 UNITS EX SOLR
CUTANEOUS | Status: AC
  Filled 2018-05-31: qty 10000

## 2018-05-31 MED ORDER — LIDOCAINE 2% (20 MG/ML) 5 ML SYRINGE
INTRAMUSCULAR | Status: DC | PRN
  Administered 2018-05-31: 40 mg via INTRAVENOUS

## 2018-05-31 MED ORDER — ACETAMINOPHEN 325 MG RE SUPP: 325.0000 mg | RECTAL | Status: DC | PRN

## 2018-05-31 MED ORDER — SODIUM CHLORIDE 0.9 % IV SOLN
INTRAVENOUS | Status: DC | PRN
  Administered 2018-05-31: 500 mL

## 2018-05-31 MED ORDER — HYDRALAZINE HCL 20 MG/ML IJ SOLN: 5.0000 mg | INTRAMUSCULAR | Status: DC | PRN

## 2018-05-31 MED ORDER — DEXAMETHASONE SODIUM PHOSPHATE 10 MG/ML IJ SOLN
INTRAMUSCULAR | Status: AC
  Filled 2018-05-31: qty 1

## 2018-05-31 MED ORDER — SODIUM CHLORIDE 0.9 % IV SOLN
INTRAVENOUS | Status: AC
  Filled 2018-05-31: qty 1.2

## 2018-05-31 MED ORDER — PANTOPRAZOLE SODIUM 40 MG IV SOLR
40.0000 mg | Freq: Two times a day (BID) | INTRAVENOUS | Status: DC
  Administered 2018-05-31 – 2018-06-01 (×2): 40 mg via INTRAVENOUS
  Filled 2018-05-31 (×3): qty 40

## 2018-05-31 MED ORDER — HEPARIN SODIUM (PORCINE) 1000 UNIT/ML IJ SOLN
INTRAMUSCULAR | Status: AC
  Filled 2018-05-31: qty 1

## 2018-05-31 MED ORDER — SUGAMMADEX SODIUM 200 MG/2ML IV SOLN
INTRAVENOUS | Status: DC | PRN
  Administered 2018-05-31: 200 mg via INTRAVENOUS

## 2018-05-31 MED ORDER — HYDROMORPHONE HCL 1 MG/ML IJ SOLN
0.5000 mg | INTRAMUSCULAR | Status: DC | PRN
  Administered 2018-05-31 – 2018-06-03 (×10): 1 mg via INTRAVENOUS
  Filled 2018-05-31 (×10): qty 1

## 2018-05-31 MED ORDER — MIDAZOLAM HCL 2 MG/2ML IJ SOLN: 0.5000 mg | Freq: Once | INTRAMUSCULAR | Status: DC | PRN

## 2018-05-31 MED ORDER — ROCURONIUM BROMIDE 10 MG/ML (PF) SYRINGE
PREFILLED_SYRINGE | INTRAVENOUS | Status: DC | PRN
  Administered 2018-05-31: 50 mg via INTRAVENOUS
  Administered 2018-05-31: 25 mg via INTRAVENOUS

## 2018-05-31 MED ORDER — ONDANSETRON HCL 4 MG/2ML IJ SOLN
INTRAMUSCULAR | Status: DC | PRN
  Administered 2018-05-31: 4 mg via INTRAVENOUS

## 2018-05-31 MED ORDER — MORPHINE SULFATE (PF) 2 MG/ML IV SOLN
2.0000 mg | Freq: Once | INTRAVENOUS | Status: AC
  Administered 2018-05-31: 2 mg via INTRAVENOUS
  Filled 2018-05-31: qty 1

## 2018-05-31 MED ORDER — ONDANSETRON HCL 4 MG/2ML IJ SOLN
INTRAMUSCULAR | Status: AC
  Filled 2018-05-31: qty 2

## 2018-05-31 MED ORDER — CEFAZOLIN SODIUM-DEXTROSE 2-4 GM/100ML-% IV SOLN
2.0000 g | Freq: Three times a day (TID) | INTRAVENOUS | Status: AC
  Administered 2018-05-31 – 2018-06-01 (×2): 2 g via INTRAVENOUS
  Filled 2018-05-31 (×2): qty 100

## 2018-05-31 MED ORDER — FENTANYL CITRATE (PF) 250 MCG/5ML IJ SOLN
INTRAMUSCULAR | Status: AC
  Filled 2018-05-31: qty 5

## 2018-05-31 MED ORDER — MEPERIDINE HCL 50 MG/ML IJ SOLN: 6.2500 mg | INTRAMUSCULAR | Status: DC | PRN

## 2018-05-31 MED ORDER — ALUM & MAG HYDROXIDE-SIMETH 200-200-20 MG/5ML PO SUSP: 15.0000 mL | ORAL | Status: DC | PRN

## 2018-05-31 MED ORDER — HEPARIN SODIUM (PORCINE) 1000 UNIT/ML IJ SOLN
INTRAMUSCULAR | Status: DC | PRN
  Administered 2018-05-31: 8000 [IU] via INTRAVENOUS

## 2018-05-31 MED ORDER — HEMOSTATIC AGENTS (NO CHARGE) OPTIME
TOPICAL | Status: DC | PRN
  Administered 2018-05-31: 1 via TOPICAL

## 2018-05-31 MED ORDER — PROPOFOL 10 MG/ML IV BOLUS
INTRAVENOUS | Status: AC
  Filled 2018-05-31: qty 20

## 2018-05-31 MED ORDER — METOPROLOL TARTRATE 5 MG/5ML IV SOLN: 2.0000 mg | INTRAVENOUS | Status: DC | PRN

## 2018-05-31 MED ORDER — FENTANYL CITRATE (PF) 100 MCG/2ML IJ SOLN: 25.0000 ug | INTRAMUSCULAR | Status: DC | PRN

## 2018-05-31 MED ORDER — METOPROLOL TARTRATE 12.5 MG HALF TABLET
12.5000 mg | ORAL_TABLET | Freq: Two times a day (BID) | ORAL | Status: DC
  Administered 2018-06-01 – 2018-06-08 (×15): 12.5 mg via ORAL
  Filled 2018-05-31 (×16): qty 1

## 2018-05-31 MED ORDER — LABETALOL HCL 5 MG/ML IV SOLN: 10.0000 mg | INTRAVENOUS | Status: DC | PRN

## 2018-05-31 MED ORDER — SODIUM CHLORIDE 0.9 % IV SOLN: 500.0000 mL | Freq: Once | INTRAVENOUS | Status: DC | PRN

## 2018-05-31 MED ORDER — OXYCODONE HCL 5 MG PO TABS
5.0000 mg | ORAL_TABLET | ORAL | Status: DC | PRN
  Administered 2018-05-31: 5 mg via ORAL
  Administered 2018-06-02 – 2018-06-08 (×14): 10 mg via ORAL
  Filled 2018-05-31 (×10): qty 2
  Filled 2018-05-31: qty 1
  Filled 2018-05-31 (×5): qty 2

## 2018-05-31 MED ORDER — DOCUSATE SODIUM 100 MG PO CAPS
100.0000 mg | ORAL_CAPSULE | Freq: Every day | ORAL | Status: DC
  Administered 2018-06-01 – 2018-06-08 (×8): 100 mg via ORAL
  Filled 2018-05-31 (×8): qty 1

## 2018-05-31 MED ORDER — PROPOFOL 10 MG/ML IV BOLUS
INTRAVENOUS | Status: DC | PRN
  Administered 2018-05-31: 100 mg via INTRAVENOUS

## 2018-05-31 MED ORDER — HEPARIN (PORCINE) 25000 UT/250ML-% IV SOLN
1500.0000 [IU]/h | INTRAVENOUS | Status: DC
  Administered 2018-05-31 – 2018-06-01 (×2): 1100 [IU]/h via INTRAVENOUS
  Administered 2018-06-02: 1250 [IU]/h via INTRAVENOUS
  Administered 2018-06-03 – 2018-06-04 (×2): 1450 [IU]/h via INTRAVENOUS
  Administered 2018-06-05 – 2018-06-06 (×2): 1400 [IU]/h via INTRAVENOUS
  Administered 2018-06-07: 1500 [IU]/h via INTRAVENOUS
  Filled 2018-05-31 (×9): qty 250

## 2018-05-31 MED ORDER — ROCURONIUM BROMIDE 50 MG/5ML IV SOSY
PREFILLED_SYRINGE | INTRAVENOUS | Status: AC
  Filled 2018-05-31: qty 10

## 2018-05-31 MED ORDER — LACTATED RINGERS IV SOLN
INTRAVENOUS | Status: DC | PRN
  Administered 2018-05-31 (×2): via INTRAVENOUS

## 2018-05-31 MED ORDER — PHENOL 1.4 % MT LIQD: 1.0000 | OROMUCOSAL | Status: DC | PRN

## 2018-05-31 MED ORDER — ONDANSETRON HCL 4 MG/2ML IJ SOLN
4.0000 mg | INTRAMUSCULAR | Status: DC | PRN
  Administered 2018-05-31 – 2018-06-08 (×3): 4 mg via INTRAVENOUS
  Filled 2018-05-31 (×3): qty 2

## 2018-05-31 MED ORDER — POTASSIUM CHLORIDE CRYS ER 20 MEQ PO TBCR: 20.0000 meq | EXTENDED_RELEASE_TABLET | Freq: Every day | ORAL | Status: DC | PRN

## 2018-05-31 MED ORDER — PROMETHAZINE HCL 25 MG/ML IJ SOLN
INTRAMUSCULAR | Status: AC
  Filled 2018-05-31: qty 1

## 2018-05-31 MED ORDER — 0.9 % SODIUM CHLORIDE (POUR BTL) OPTIME
TOPICAL | Status: DC | PRN
  Administered 2018-05-31: 2000 mL

## 2018-05-31 SURGICAL SUPPLY — 64 items
ADH SKN CLS APL DERMABOND .7 (GAUZE/BANDAGES/DRESSINGS) ×2
BANDAGE ACE 4X5 VEL STRL LF (GAUZE/BANDAGES/DRESSINGS) IMPLANT
BANDAGE ESMARK 6X9 LF (GAUZE/BANDAGES/DRESSINGS) IMPLANT
BNDG CMPR 9X6 STRL LF SNTH (GAUZE/BANDAGES/DRESSINGS)
BNDG ESMARK 6X9 LF (GAUZE/BANDAGES/DRESSINGS)
CANISTER SUCT 3000ML PPV (MISCELLANEOUS) ×4 IMPLANT
CANNULA VESSEL 3MM 2 BLNT TIP (CANNULA) IMPLANT
CATH EMB 3FR 40CM (CATHETERS) ×3 IMPLANT
CATH EMB 3FR 80CM (CATHETERS) ×6 IMPLANT
CATH EMB 4FR 80CM (CATHETERS) ×6 IMPLANT
CATH EMB 5FR 80CM (CATHETERS) IMPLANT
CLIP VESOCCLUDE MED 24/CT (CLIP) ×4 IMPLANT
CLIP VESOCCLUDE SM WIDE 24/CT (CLIP) ×4 IMPLANT
COVER WAND RF STERILE (DRAPES) ×4 IMPLANT
CUFF TOURNIQUET SINGLE 24IN (TOURNIQUET CUFF) IMPLANT
CUFF TOURNIQUET SINGLE 34IN LL (TOURNIQUET CUFF) IMPLANT
CUFF TOURNIQUET SINGLE 44IN (TOURNIQUET CUFF) IMPLANT
DERMABOND ADVANCED (GAUZE/BANDAGES/DRESSINGS) ×2
DERMABOND ADVANCED .7 DNX12 (GAUZE/BANDAGES/DRESSINGS) ×2 IMPLANT
DRAIN CHANNEL 15F RND FF W/TCR (WOUND CARE) IMPLANT
DRAPE C-ARM 42X72 X-RAY (DRAPES) IMPLANT
DRAPE HALF SHEET 40X57 (DRAPES) IMPLANT
DRAPE X-RAY CASS 24X20 (DRAPES) IMPLANT
ELECT REM PT RETURN 9FT ADLT (ELECTROSURGICAL) ×4
ELECTRODE REM PT RTRN 9FT ADLT (ELECTROSURGICAL) ×2 IMPLANT
EVACUATOR SILICONE 100CC (DRAIN) IMPLANT
GLOVE BIO SURGEON STRL SZ7.5 (GLOVE) ×7 IMPLANT
GLOVE BIOGEL PI IND STRL 7.5 (GLOVE) ×1 IMPLANT
GLOVE BIOGEL PI INDICATOR 7.5 (GLOVE) ×2
GOWN STRL REUS W/ TWL LRG LVL3 (GOWN DISPOSABLE) ×4 IMPLANT
GOWN STRL REUS W/ TWL XL LVL3 (GOWN DISPOSABLE) ×2 IMPLANT
GOWN STRL REUS W/TWL LRG LVL3 (GOWN DISPOSABLE) ×8
GOWN STRL REUS W/TWL XL LVL3 (GOWN DISPOSABLE) ×4
HEMOSTAT SNOW SURGICEL 2X4 (HEMOSTASIS) ×3 IMPLANT
INSERT FOGARTY SM (MISCELLANEOUS) IMPLANT
KIT BASIN OR (CUSTOM PROCEDURE TRAY) ×4 IMPLANT
KIT TURNOVER KIT B (KITS) ×4 IMPLANT
MARKER GRAFT CORONARY BYPASS (MISCELLANEOUS) IMPLANT
NS IRRIG 1000ML POUR BTL (IV SOLUTION) ×8 IMPLANT
PACK PERIPHERAL VASCULAR (CUSTOM PROCEDURE TRAY) ×4 IMPLANT
PAD ARMBOARD 7.5X6 YLW CONV (MISCELLANEOUS) ×8 IMPLANT
PATCH HEMASHIELD 8X75 (Vascular Products) ×3 IMPLANT
SET COLLECT BLD 21X3/4 12 (NEEDLE) IMPLANT
STOPCOCK 4 WAY LG BORE MALE ST (IV SETS) IMPLANT
SUT ETHILON 3 0 PS 1 (SUTURE) IMPLANT
SUT GORETEX 6.0 TT13 (SUTURE) IMPLANT
SUT GORETEX 6.0 TT9 (SUTURE) IMPLANT
SUT MNCRL AB 4-0 PS2 18 (SUTURE) ×5 IMPLANT
SUT PROLENE 5 0 C 1 24 (SUTURE) ×7 IMPLANT
SUT PROLENE 6 0 BV (SUTURE) ×1 IMPLANT
SUT PROLENE 7 0 BV 1 (SUTURE) IMPLANT
SUT SILK 2 0 SH (SUTURE) ×1 IMPLANT
SUT SILK 3 0 (SUTURE)
SUT SILK 3-0 18XBRD TIE 12 (SUTURE) IMPLANT
SUT VIC AB 2-0 CT1 27 (SUTURE) ×4
SUT VIC AB 2-0 CT1 TAPERPNT 27 (SUTURE) ×3 IMPLANT
SUT VIC AB 3-0 SH 27 (SUTURE) ×4
SUT VIC AB 3-0 SH 27X BRD (SUTURE) ×3 IMPLANT
TAPE UMBILICAL COTTON 1/8X30 (MISCELLANEOUS) IMPLANT
TOWEL GREEN STERILE (TOWEL DISPOSABLE) ×4 IMPLANT
TRAY FOLEY MTR SLVR 16FR STAT (SET/KITS/TRAYS/PACK) ×4 IMPLANT
TUBING EXTENTION W/L.L. (IV SETS) IMPLANT
UNDERPAD 30X30 (UNDERPADS AND DIAPERS) ×4 IMPLANT
WATER STERILE IRR 1000ML POUR (IV SOLUTION) ×4 IMPLANT

## 2018-05-31 NOTE — Op Note (Signed)
    Patient name: [name] MRN: [id number] DOB: 07/29/1945 Sex: female  05/31/2018 Pre-operative Diagnosis: Acute right lower extremity ischemia  Post-operative diagnosis:  Same Surgeon:  Apolinar Junes C. Randie Heinz, MD Assistant: Lemmie Evens, NP Procedure Performed: 1.  Right lower extremity thromboembolectomy 2.  Right common femoral artery patch angioplasty with Dacron  Indications: 73 year old female with history of aortobifemoral bypass.  She now is a 4-day history of right lower extremity pain with numbness and motor deficit.  She also has discoloration of her left-sided toes has a palpable dorsalis pedis pulse on that side.  She has minimal signals on the right side with ABI noted to be 0.1.  She is indicated for thromboembolectomy and other repair.  Findings: There was acute and chronic appearing thrombus in her aortobifemoral right limb.  There was also clot in her common femoral artery and down her SFA and profunda femoris arteries.  After thromboembolectomy we performed patch angioplasty.  At completion she had strong signal in the dorsalis pedis and posterior tibial signal on the right.  She does have discoloration of the toes on the left but strong signals of the ankle no intervention was undertaken.  We did not perform fasciotomy given that some of this blockage was chronic but we will have to watch her closely for this.   Procedure:  The patient was identified in the holding area and taken to the operating room was placed by operative table general anesthesia induced.  She was sterilely prepped draped bilateral lower extremities given antibiotics and a timeout was called.  We began with longitudinal incision in the right groin in the area of her previous scar.  We dissected down identified her SFA and profunda femoris arteries placed Vesseloops around these.  We then dissected up onto the graft up under the inguinal ligament and encircled this with vessel loop as well.  Patient was fully  heparinized.  Transverse graftotomy was then made.  4 Fogarty was passed centrally we were able to remove significant acute and chronic appearing thrombus with 3 passes until no further thrombus was retrieved the graft was then clamped.  We then turned our attention distally.  Unfortunately I could not get the Fogarty's to pass distally.  I then opened the graft longitudinally down towards the SFA.  I trimmed some of the graft off.  We were able to pass 3 Fogarty distally down the profunda and SFA did return minimal clot.  We then cleaned up the common femoral artery irrigated with heparinized saline.  Our profunda and SFA were clamped.  We trimmed a dacryon patch to size noted in place with 5-0 Prolene suture.  Prior completion anastomosis we allowed flushing all directions.  Upon completion we then had good signals in our SFA and profunda within the wound as well as the posterior tibial anterior tibial at the ankle.  The foot was pink with good capillary refill.  Satisfied with this we obtained hemostasis and a wound irrigated.  We did not reverse heparinization.  We closed in layers Vicryl Monocryl.  Dermabond is placed to level skin.  She was awakened from anesthesia having tolerated procedure without immediate complication.  All counts were correct at completion.  EBL: 500 cc  Nafis Farnan C. Randie Heinz, MD [facility] Office: [phone] Pager: [phone]

## 2018-05-31 NOTE — Anesthesia Procedure Notes (Signed)
Procedure Name: Intubation Date/Time: 05/31/2018 9:20 AM Performed by: Teressa Lower., CRNA Pre-anesthesia Checklist: Patient identified, Emergency Drugs available, Suction available and Patient being monitored Patient Re-evaluated:Patient Re-evaluated prior to induction Oxygen Delivery Method: Circle system utilized Preoxygenation: Pre-oxygenation with 100% oxygen Induction Type: IV induction Ventilation: Mask ventilation without difficulty and Oral airway inserted - appropriate to patient size Laryngoscope Size: Mac and 3 Grade View: Grade I Tube type: Oral Tube size: 7.0 mm Number of attempts: 1 Airway Equipment and Method: Stylet and Oral airway Placement Confirmation: ETT inserted through vocal cords under direct vision,  positive ETCO2 and breath sounds checked- equal and bilateral Secured at: 22 cm Tube secured with: Tape Dental Injury: Teeth and Oropharynx as per pre-operative assessment

## 2018-05-31 NOTE — Progress Notes (Signed)
The pain in pt's right leg increased to a level 10 despite being given the Tramadol.  Paged the hospitalist, who ordered a one time morphine 2 mg IV.  Will continue to monitor.  Harriet Masson, RN

## 2018-05-31 NOTE — Progress Notes (Signed)
ANTICOAGULATION CONSULT NOTE - Initial Consult  Pharmacy Consult for heparin Indication: acute limb ischemia  No Known Allergies  Patient Measurements: Height: 5\' 7"  (170.2 cm) Weight: 160 lb 4.4 oz (72.7 kg) IBW/kg (Calculated) : 61.6 Heparin Dosing Weight: 72 kf   Vital Signs: Temp: 97.2 F (36.2 C) (03/01 1102) Temp Source: Oral (03/01 0800) BP: 138/62 (03/01 1230) Pulse Rate: 76 (03/01 1230)  Labs: Recent Labs    05/29/18 0541 05/30/18 0512  HGB 12.0 11.5*  HCT 39.4 36.7  PLT 393 392  CREATININE 0.63 0.61    Estimated Creatinine Clearance: 62.7 mL/min (by C-G formula based on SCr of 0.61 mg/dL).   Medical History: Past Medical History:  Diagnosis Date  . Chronic back pain   . GERD (gastroesophageal reflux disease)   . Hypercholesterolemia   . Hyperlipidemia   . Hypertension   . Osteoarthritis of left knee    End-stage  . Palpitations   . PVD (peripheral vascular disease) (HCC)   . Tobacco user     Medications:  Medications Prior to Admission  Medication Sig Dispense Refill Last Dose  . atorvastatin (LIPITOR) 40 MG tablet Take 1 tablet (40 mg total) by mouth daily. 30 tablet 5 05/27/2018 at Unknown time  . DULoxetine (CYMBALTA) 60 MG capsule Take 1 capsule (60 mg total) by mouth daily. 30 capsule 5 05/27/2018 at Unknown time  . fenofibrate 160 MG tablet TAKE 1 TABLET BY MOUTH ONCE DAILY 90 tablet 1 05/27/2018 at Unknown time  . lisinopril (PRINIVIL,ZESTRIL) 20 MG tablet Take 1 tablet (20 mg total) by mouth daily. 90 tablet 3 05/27/2018 at Unknown time  . omeprazole (PRILOSEC) 20 MG capsule Take 1 capsule (20 mg total) by mouth daily. 90 capsule 3 05/27/2018 at Unknown time  . tiZANidine (ZANAFLEX) 4 MG tablet Take 1-2 tablets (4-8 mg total) by mouth every 8 (eight) hours as needed for muscle spasms. 90 tablet 1 Past Week at Unknown time  . [DISCONTINUED] meloxicam (MOBIC) 15 MG tablet Take 1 tablet (15 mg total) by mouth daily. 90 tablet 1 05/27/2018 at Unknown  time  . pregabalin (LYRICA) 200 MG capsule Take 1 capsule by mouth 3 (three) times daily.       Assessment: 73 YOF with acute thrombus in RLE now s/p thromboembolectomy and patch angioplasty. Pharmacy consulted to start IV heparin at 3PM today. Mildly anemic this AM. Plt wnl   Goal of Therapy:  Heparin level 0.3-0.7 units/ml Monitor platelets by anticoagulation protocol: Yes   Plan:  -Start IV heparin at 1100 units/hr at 3PM today. No bolus  -F/u 8 hr HL  -Monitor CBC, daily HL and s/s of bleeding   Vinnie Level, PharmD., BCPS Clinical Pharmacist Clinical phone for 05/31/18 until 3:30pm: [phone] If after 3:30pm, please refer to [facility] for unit-specific pharmacist

## 2018-05-31 NOTE — Transfer of Care (Signed)
Immediate Anesthesia Transfer of Care Note  Patient: [name]  Procedure(s) Performed: RIGHT FEMORAL THROMBECTOMY (Right ) PATCH ANGIOPLASTY RIGHT FEMORAL ARTERY WITH DECRON PATCH (Right )  Patient Location: PACU  Anesthesia Type:General  Level of Consciousness: awake, alert  and oriented  Airway & Oxygen Therapy: Patient Spontanous Breathing and Patient connected to face mask oxygen  Post-op Assessment: Report given to RN and Post -op Vital signs reviewed and stable  Post vital signs: Reviewed and stable  Last Vitals:  Vitals Value Taken Time  BP 198/72 05/31/2018 11:04 AM  Temp    Pulse 71 05/31/2018 11:04 AM  Resp 12 05/31/2018 11:04 AM  SpO2 100 % 05/31/2018 11:04 AM  Vitals shown include unvalidated device data.  Last Pain:  Vitals:   05/31/18 0800  TempSrc: Oral  PainSc:       Patients Stated Pain Goal: 1 (05/29/18 0549)  Complications: No apparent anesthesia complications

## 2018-05-31 NOTE — Anesthesia Postprocedure Evaluation (Signed)
Anesthesia Post Note  Patient: [name]  Procedure(s) Performed: RIGHT FEMORAL THROMBECTOMY (Right ) PATCH ANGIOPLASTY RIGHT FEMORAL ARTERY WITH DECRON PATCH (Right )     Patient location during evaluation: PACU Anesthesia Type: General Level of consciousness: awake and alert, patient cooperative and oriented Pain management: pain level controlled Vital Signs Assessment: post-procedure vital signs reviewed and stable Respiratory status: spontaneous breathing, nonlabored ventilation and respiratory function stable Cardiovascular status: blood pressure returned to baseline and stable Postop Assessment: no apparent nausea or vomiting Anesthetic complications: no    Last Vitals:  Vitals:   05/31/18 1115 05/31/18 1130  BP: (!) 174/74 (!) 146/74  Pulse: 84 70  Resp: 19 17  Temp:    SpO2: 94% 94%    Last Pain:  Vitals:   05/31/18 1130  TempSrc:   PainSc: 0-No pain                 Killian Ress,E. Shenaya Lebo

## 2018-05-31 NOTE — Consult Note (Signed)
Hospital Consult    Reason for Consult:  Right lower extremity ischemia Referring Physician:  Dr. Mahala Menghini MRN #:  [id number]  History of Present Illness: This is a 73 y.o. female with history of aortobifemoral bypass multiple years ago by Dr. Hart Rochester.  Wednesday she fell down her leg became numb.  This was right-sided.  She had no left-sided pain.  Has never had issues like this before.  States that she is numb from just below her knee down to her foot.  She can intermittently move her foot on the right side.  States pain is 10 out of 10.  Does not have left-sided pain at all  Past Medical History:  Diagnosis Date  . Chronic back pain   . GERD (gastroesophageal reflux disease)   . Hypercholesterolemia   . Hyperlipidemia   . Hypertension   . Osteoarthritis of left knee    End-stage  . Palpitations   . PVD (peripheral vascular disease) (HCC)   . Tobacco user     Past Surgical History:  Procedure Laterality Date  . Angiogram-type unspecified  06/13/00   Dr. Hart Rochester  . FEMORAL-POPLITEAL BYPASS GRAFT    . FINGER TENDON REPAIR  1988   Right mallet  . KNEE ARTHROSCOPY  1996   Left  . LUMBAR FUSION  1998   Dr. Jacqulyn Bath    No Known Allergies  Prior to Admission medications   Medication Sig Start Date End Date Taking? Authorizing Provider  atorvastatin (LIPITOR) 40 MG tablet Take 1 tablet (40 mg total) by mouth daily. 10/23/17  Yes Martin, Mary-Margaret, [name]  DULoxetine (CYMBALTA) 60 MG capsule Take 1 capsule (60 mg total) by mouth daily. 10/23/17  Yes Daphine Deutscher, Mary-Margaret, [name]  fenofibrate 160 MG tablet TAKE 1 TABLET BY MOUTH ONCE DAILY 11/25/17  Yes Daphine Deutscher, Mary-Margaret, [name]  lisinopril (PRINIVIL,ZESTRIL) 20 MG tablet Take 1 tablet (20 mg total) by mouth daily. 04/24/18  Yes [name], [name], [name]  omeprazole (PRILOSEC) 20 MG capsule Take 1 capsule (20 mg total) by mouth daily. 03/05/18  Yes [name], [name], [name]  tiZANidine (ZANAFLEX) 4 MG tablet Take 1-2 tablets (4-8 mg total) by mouth  every 8 (eight) hours as needed for muscle spasms. 09/02/16  Yes Martin, Mary-Margaret, [name]  amLODipine (NORVASC) 10 MG tablet Take 1 tablet (10 mg total) by mouth daily. 05/30/18   Darlin Drop, DO  cephALEXin (KEFLEX) 500 MG capsule Take 1 capsule (500 mg total) by mouth 4 (four) times daily for 10 days. 05/30/18 06/09/18  Darlin Drop, DO  meloxicam (MOBIC) 15 MG tablet Take 1 tablet (15 mg total) by mouth daily. 05/30/18   Darlin Drop, DO  nicotine (NICODERM CQ - DOSED IN MG/24 HOURS) 21 mg/24hr patch Place 1 patch (21 mg total) onto the skin daily. 05/30/18   Darlin Drop, DO  pregabalin (LYRICA) 200 MG capsule Take 1 capsule by mouth 3 (three) times daily. 05/20/18   [provider]    Social History   Socioeconomic History  . Marital status: Divorced    Spouse name: Not on file  . Number of children: Not on file  . Years of education: Not on file  . Highest education level: Not on file  Occupational History  . Occupation: Charity fundraiser: [employer]  Social Needs  . Financial resource strain: Not on file  . Food insecurity:    Worry: Not on file    Inability: Not on file  . Transportation needs:  Medical: Not on file    Non-medical: Not on file  Tobacco Use  . Smoking status: Current Every Day Smoker    Packs/day: 1.00    Years: 52.00    Pack years: 52.00    Types: Cigarettes  . Smokeless tobacco: Never Used  Substance and Sexual Activity  . Alcohol use: No    Comment: Occasionally  . Drug use: No  . Sexual activity: Never  Lifestyle  . Physical activity:    Days per week: Not on file    Minutes per session: Not on file  . Stress: Not on file  Relationships  . Social connections:    Talks on phone: Not on file    Gets together: Not on file    Attends religious service: Not on file    Active member of club or organization: Not on file    Attends meetings of clubs or organizations: Not on file    Relationship status: Not on file  .  Intimate partner violence:    Fear of current or ex partner: Not on file    Emotionally abused: Not on file    Physically abused: Not on file    Forced sexual activity: Not on file  Other Topics Concern  . Not on file  Social History Narrative  . Not on file     Family History  Problem Relation Age of Onset  . Heart failure Father        CHF  . Hypertension Father   . Heart disease Father   . Hypertension Paternal Aunt   . Hypertension Paternal Uncle   . Hypertension Paternal Grandmother   . Hypertension Paternal Grandfather   . Coronary artery disease Neg Hx        Premature    ROS: Right leg numbness and pain as above   Physical Examination  Vitals:   05/30/18 2344 05/31/18 0315  BP: 127/65 (!) 165/69  Pulse: 81 78  Resp: (!) 23 19  Temp: (!) 97.5 F (36.4 C) 97.8 F (36.6 C)  SpO2: 92% 98%   Body mass index is 25.1 kg/m.  General: No acute distress Gait: Not observed HENT: WNL, normocephalic Pulmonary: normal non-labored breathing Cardiac: Palpable left common femoral pulse no palpable right Abdomen: soft, NT/ND, no masses Extremities: Right leg not mottled the dusky appearance from the ankle down.  There is no discernible posterior tibial or dorsalis pedis signals on the right side.  She does not have sensation from the ankle down.  Currently cannot move the right foot. Neurologic: A&O X 3  CBC    Component Value Date/Time   WBC 11.8 (H) 05/30/2018 0512   RBC 4.30 05/30/2018 0512   HGB 11.5 (L) 05/30/2018 0512   HGB 12.5 05/22/2018 1039   HCT 36.7 05/30/2018 0512   HCT 39.4 05/22/2018 1039   PLT 392 05/30/2018 0512   PLT 437 05/22/2018 1039   MCV 85.3 05/30/2018 0512   MCV 85 05/22/2018 1039   MCH 26.7 05/30/2018 0512   MCHC 31.3 05/30/2018 0512   RDW 15.2 05/30/2018 0512   RDW 14.5 05/22/2018 1039   LYMPHSABS 1.7 05/30/2018 0512   LYMPHSABS 1.7 05/22/2018 1039   MONOABS 1.4 (H) 05/30/2018 0512   EOSABS 0.1 05/30/2018 0512   EOSABS 0.3  05/22/2018 1039   BASOSABS 0.1 05/30/2018 0512   BASOSABS 0.1 05/22/2018 1039    BMET    Component Value Date/Time   NA 131 (L) 05/30/2018 0512   NA 142  05/22/2018 1039   K 3.6 05/30/2018 0512   CL 95 (L) 05/30/2018 0512   CO2 25 05/30/2018 0512   GLUCOSE 111 (H) 05/30/2018 0512   BUN 9 05/30/2018 0512   BUN 10 05/22/2018 1039   CREATININE 0.61 05/30/2018 0512   CREATININE 0.72 08/07/2012 1016   CALCIUM 8.9 05/30/2018 0512   GFRNONAA >60 05/30/2018 0512   GFRNONAA 88 08/07/2012 1016   GFRAA >60 05/30/2018 0512   GFRAA >89 08/07/2012 1016    COAGS: Lab Results  Component Value Date   INR 1.05 05/17/2009   INR 1.02 05/16/2009   INR 2.9 (H) 02/08/2008     Non-Invasive Vascular Imaging:   Right ABI 0.1, left 0.9  I reviewed her CT scan which demonstrates an occlusion of her right limb of her aortobifemoral bypass graft.  She has runoff in line to the foot.  ASSESSMENT/PLAN: This is a 73 y.o. female here with what appears to be acute on chronic right lower extremity ischemia she is densely ischemic with sensory and motor deficits to the right foot.  She will be at risk of limb loss during this hospitalization.  We will take her urgently to the operating room this morning for right lower extremity thromboembolectomy and possible left to right femorofemoral bypass.  Tyashia Morrisette C. Randie Heinz, MD [facility] [place] Office: [phone] Pager: [phone]

## 2018-05-31 NOTE — Progress Notes (Signed)
ANTICOAGULATION CONSULT NOTE  Pharmacy Consult for heparin Indication: acute limb ischemia  No Known Allergies  Patient Measurements: Height: 5\' 7"  (170.2 cm) Weight: 160 lb 4.4 oz (72.7 kg) IBW/kg (Calculated) : 61.6 Heparin Dosing Weight: 72 kf   Vital Signs: Temp: 97.6 F (36.4 C) (03/01 2036) Temp Source: Oral (03/01 2036) BP: 101/56 (03/01 2240) Pulse Rate: 93 (03/01 2240)  Labs: Recent Labs    05/29/18 0541 05/30/18 0512 05/31/18 2239  HGB 12.0 11.5*  --   HCT 39.4 36.7  --   PLT 393 392  --   HEPARINUNFRC  --   --  0.41  CREATININE 0.63 0.61  --     Estimated Creatinine Clearance: 62.7 mL/min (by C-G formula based on SCr of 0.61 mg/dL).  Assessment: 73 yo female with RLE thrombus s/p thrombectomy for heparin   Goal of Therapy:  Heparin level 0.3-0.7 units/ml Monitor platelets by anticoagulation protocol: Yes   Plan:  Continue Heparin at current rate  Geannie Risen, PharmD, BCPS

## 2018-05-31 NOTE — Anesthesia Preprocedure Evaluation (Addendum)
Anesthesia Evaluation  Patient identified by MRN, date of birth, ID band Patient awake    Reviewed: Allergy & Precautions, NPO status , Patient's Chart, lab work & pertinent test results  History of Anesthesia Complications Negative for: history of anesthetic complications  Airway Mallampati: II  TM Distance: >3 FB Neck ROM: Full    Dental  (+) Edentulous Upper, Edentulous Lower   Pulmonary Current Smoker,    breath sounds clear to auscultation       Cardiovascular hypertension, Pt. on medications (-) angina+ Peripheral Vascular Disease   Rhythm:Regular Rate:Normal     Neuro/Psych negative neurological ROS     GI/Hepatic Neg liver ROS, GERD  Poorly Controlled,  Endo/Other  negative endocrine ROS  Renal/GU negative Renal ROS     Musculoskeletal  (+) Arthritis ,   Abdominal   Peds  Hematology negative hematology ROS (+)   Anesthesia Other Findings   Reproductive/Obstetrics                            Anesthesia Physical Anesthesia Plan  ASA: III  Anesthesia Plan: General   Post-op Pain Management:    Induction: Intravenous  PONV Risk Score and Plan: 2 and Ondansetron and Dexamethasone  Airway Management Planned: Oral ETT  Additional Equipment:   Intra-op Plan:   Post-operative Plan: Extubation in OR  Informed Consent: I have reviewed the patients History and Physical, chart, labs and discussed the procedure including the risks, benefits and alternatives for the proposed anesthesia with the patient or authorized representative who has indicated his/her understanding and acceptance.     Dental advisory given  Plan Discussed with: CRNA and Surgeon  Anesthesia Plan Comments: (Plan routine monitors, GETA)        Anesthesia Quick Evaluation

## 2018-05-31 NOTE — Progress Notes (Signed)
Pt was complaining of pain in the right leg at a level 8 out of 10.  No medicines were ordered for severe pain.  The on call hospitalist was paged and ordered a one time Tramadol 50 mg tablet by mouth. Also, Dr. Randie Heinz was called about the pt's cyanotic toes and no pulse found on the right foot with the doppler. Dr. Randie Heinz said he would see the pt in the AM.  Will continue to monitor.  Harriet Masson, RN

## 2018-05-31 NOTE — Progress Notes (Addendum)
[facility] PROGRESS NOTE  [name] CHE:[id number] DOB: [birth year] DOA: 05/27/2018 PCP: Junie Spencer, [name]   Narrative: 73 year old female HLD HTN Tobacco abuse Peripheral vascular disease status post femoropopliteal bypass June 29, 2000 Recurrent L4-5 herniated nucleus pulposus status post TLIF 2005 Status post cardiac cath 05/17/2009 with no specific disease Palpitations  Patient was admitted 2/27 after having a fall in the ED room while visiting her mom at [facility] had bilateral leg pain in the setting of her chronic back pain, wheezing, overall generalized weakness labs were normal and because of concern for dirty urine started on ceftriaxone CT angiogram chronic occlusion scar complete occlusion right iliac limb bypass with reconstitution of flow to right CFA  Found on the morning of 2/28 to have an acutely cold limb transferred emergently to [facility] for vascular eval   A & Plan 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 and CFA down SFA profundus femoris, ABI right 0.1 left 0.9 Thromboembolectomy performed 3/1+ patch angioplasty Further management as per vascular surgeon Continue Lidoderm patch Smoker Needs counseling about cessation previously on Chantix, continue nicotine patch Chronic low back pain status post surgery Spinal cord stimulator 2 years previously Continue Lyrica 200 3 times daily, Cymbalta 60 daily CT evidence pancreatitis No reports of abdominal pain-monitor Leukocytosis Multifactorial, possible left sinusitis-discontinue Keflex for now blood cultures negative Hyperlipidemia Continue fenofibrate 160 as well as Lipitor 40 Hypertension-uncontrolled Continue lisinopril 40 daily amlodipine 10, labetalol as needed and hydralazine as needed Adding metoprolol 12.5 twice daily Mild hyponatremia Repeat a.m. labs   Lovenox Full code Await further input from vascular surgery Inpatient for now dependent on progress   Danielle Evitt,  MD  [facility] Via [employer] app OR [email or web address] 7PM-7AM contact night coverage as above 05/31/2018, 3:17 PM  LOS: 2 days     Interval history/Subjective: Sleepy and just back from surgery Had some nausea Coherent enough to give ROS complaining of no pain at this time  Objective:  Vitals:  Vitals:   05/31/18 1215 05/31/18 1230  BP: (!) 153/64 138/62  Pulse: 72 76  Resp: 18 18  Temp:    SpO2: 92% 91%    Exam:  Awake looks about stated age EOMI NCAT Chest is clear no added sound On nasal oxygen No icterus no pallor Pulses felt bilaterally and both legs are soft Left great toe has some mottling Power 5/5 Abdomen soft    I have personally reviewed the following:   Consultants:  Vascular surgery  Procedures: Procedure Performed 3/1 1.  Right lower extremity thromboembolectomy 2.  Right common femoral artery patch angioplasty with Dacron  Antimicrobials:  Perioperative biotics now off Keflex  DATA   Labs: No labs today  Imaging studies:  No   Review and summation of old records:  yes  Scheduled Meds: . amLODipine  10 mg Oral Daily  . atorvastatin  40 mg Oral Daily  . budesonide (PULMICORT) nebulizer solution  0.25 mg Nebulization BID  . cephALEXin  500 mg Oral Q6H  . [START ON 06/01/2018] docusate sodium  100 mg Oral Daily  . DULoxetine  60 mg Oral Daily  . fenofibrate  160 mg Oral Daily  . lidocaine  2 patch Transdermal Q24H  . lisinopril  40 mg Oral Daily  . meloxicam  15 mg Oral Daily  . nicotine  21 mg Transdermal Daily  . pantoprazole  40 mg Oral Daily  . pregabalin  200 mg Oral TID  . promethazine  Continuous Infusions: . sodium chloride    .  ceFAZolin (ANCEF) IV    . heparin    . magnesium sulfate 1 - 4 g bolus IVPB      Principal Problem:   General weakness Active Problems:   Hyperlipidemia with target LDL less than 100   TOBACCO ABUSE   Essential hypertension   Peripheral vascular disease (HCC)   GERD    Depression   Fall   Pancreatitis   LOS: 2 days

## 2018-05-31 NOTE — Progress Notes (Signed)
Patient sleeping after procedure. Vitals stable and daughter at bedside. R calf and lower leg continues to be soft with brisk cap refill, pink color and warm to touch. Patient remains NPO and she and daughter aware of this.

## 2018-06-01 ENCOUNTER — Encounter (HOSPITAL_COMMUNITY): Payer: Self-pay | Admitting: Vascular Surgery

## 2018-06-01 ENCOUNTER — Inpatient Hospital Stay (HOSPITAL_COMMUNITY): Payer: Medicare Other

## 2018-06-01 DIAGNOSIS — I75029 Atheroembolism of unspecified lower extremity: Secondary | ICD-10-CM

## 2018-06-01 LAB — CBC WITH DIFFERENTIAL/PLATELET
Abs Immature Granulocytes: 0.16 10*3/uL — ABNORMAL HIGH (ref 0.00–0.07)
Basophils Absolute: 0 10*3/uL (ref 0.0–0.1)
Basophils Relative: 0 %
Eosinophils Absolute: 0 10*3/uL (ref 0.0–0.5)
Eosinophils Relative: 0 %
HCT: 31.7 % — ABNORMAL LOW (ref 36.0–46.0)
HEMOGLOBIN: 10.1 g/dL — AB (ref 12.0–15.0)
Immature Granulocytes: 1 %
Lymphocytes Relative: 7 %
Lymphs Abs: 1.4 10*3/uL (ref 0.7–4.0)
MCH: 26.7 pg (ref 26.0–34.0)
MCHC: 31.9 g/dL (ref 30.0–36.0)
MCV: 83.9 fL (ref 80.0–100.0)
MONOS PCT: 7 %
Monocytes Absolute: 1.4 10*3/uL — ABNORMAL HIGH (ref 0.1–1.0)
Neutro Abs: 16.8 10*3/uL — ABNORMAL HIGH (ref 1.7–7.7)
Neutrophils Relative %: 85 %
Platelets: 386 10*3/uL (ref 150–400)
RBC: 3.78 MIL/uL — ABNORMAL LOW (ref 3.87–5.11)
RDW: 15.3 % (ref 11.5–15.5)
WBC: 19.7 10*3/uL — ABNORMAL HIGH (ref 4.0–10.5)
nRBC: 0.1 % (ref 0.0–0.2)

## 2018-06-01 LAB — RENAL FUNCTION PANEL
Albumin: 2.5 g/dL — ABNORMAL LOW (ref 3.5–5.0)
Anion gap: 11 (ref 5–15)
BUN: 20 mg/dL (ref 8–23)
CHLORIDE: 98 mmol/L (ref 98–111)
CO2: 26 mmol/L (ref 22–32)
Calcium: 8.4 mg/dL — ABNORMAL LOW (ref 8.9–10.3)
Creatinine, Ser: 1.55 mg/dL — ABNORMAL HIGH (ref 0.44–1.00)
GFR calc Af Amer: 39 mL/min — ABNORMAL LOW (ref >60)
GFR calc non Af Amer: 33 mL/min — ABNORMAL LOW (ref >60)
Glucose, Bld: 130 mg/dL — ABNORMAL HIGH (ref 70–99)
PHOSPHORUS: 5.8 mg/dL — AB (ref 2.5–4.6)
POTASSIUM: 4.4 mmol/L (ref 3.5–5.1)
Sodium: 135 mmol/L (ref 135–145)

## 2018-06-01 LAB — HEPARIN LEVEL (UNFRACTIONATED): Heparin Unfractionated: 0.48 IU/mL (ref 0.30–0.70)

## 2018-06-01 MED ORDER — PANTOPRAZOLE SODIUM 40 MG PO TBEC
40.0000 mg | DELAYED_RELEASE_TABLET | Freq: Two times a day (BID) | ORAL | Status: DC
  Administered 2018-06-01 – 2018-06-08 (×15): 40 mg via ORAL
  Filled 2018-06-01 (×14): qty 1

## 2018-06-01 MED ORDER — SODIUM CHLORIDE 0.9 % IV SOLN
INTRAVENOUS | Status: DC
  Administered 2018-06-01 – 2018-06-08 (×9): via INTRAVENOUS

## 2018-06-01 MED ORDER — SODIUM CHLORIDE 0.9 % IV BOLUS
500.0000 mL | Freq: Once | INTRAVENOUS | Status: AC
  Administered 2018-06-01: 500 mL via INTRAVENOUS

## 2018-06-01 NOTE — Progress Notes (Addendum)
  Progress Note    06/01/2018 7:22 AM 1 Day Post-Op  Subjective:  Says she is trying to belch  Afebrile HR 70's-90's NSR 90's-150's systolic 91% 2LO2NC  Vitals:   06/01/18 0401 06/01/18 0500  BP: (!) 93/48 (!) 117/53  Pulse: 85 74  Resp: 17 14  Temp: 97.7 F (36.5 C)   SpO2: 95% 94%    Physical Exam: Cardiac:  regular Lungs:  Non labored Incisions:  Clean and dry  Extremities:  Brisk right DP/PT>peroneal; calf and anterior compartments are soft and non tender.  Unable to wiggle toes; sensation not intact right foot.    CBC    Component Value Date/Time   WBC 19.7 (H) 06/01/2018 0324   RBC 3.78 (L) 06/01/2018 0324   HGB 10.1 (L) 06/01/2018 0324   HGB 12.5 05/22/2018 1039   HCT 31.7 (L) 06/01/2018 0324   HCT 39.4 05/22/2018 1039   PLT 386 06/01/2018 0324   PLT 437 05/22/2018 1039   MCV 83.9 06/01/2018 0324   MCV 85 05/22/2018 1039   MCH 26.7 06/01/2018 0324   MCHC 31.9 06/01/2018 0324   RDW 15.3 06/01/2018 0324   RDW 14.5 05/22/2018 1039   LYMPHSABS 1.4 06/01/2018 0324   LYMPHSABS 1.7 05/22/2018 1039   MONOABS 1.4 (H) 06/01/2018 0324   EOSABS 0.0 06/01/2018 0324   EOSABS 0.3 05/22/2018 1039   BASOSABS 0.0 06/01/2018 0324   BASOSABS 0.1 05/22/2018 1039    BMET    Component Value Date/Time   NA 135 06/01/2018 0324   NA 142 05/22/2018 1039   K 4.4 06/01/2018 0324   CL 98 06/01/2018 0324   CO2 26 06/01/2018 0324   GLUCOSE 130 (H) 06/01/2018 0324   BUN 20 06/01/2018 0324   BUN 10 05/22/2018 1039   CREATININE 1.55 (H) 06/01/2018 0324   CREATININE 0.72 08/07/2012 1016   CALCIUM 8.4 (L) 06/01/2018 0324   GFRNONAA 33 (L) 06/01/2018 0324   GFRNONAA 88 08/07/2012 1016   GFRAA 39 (L) 06/01/2018 0324   GFRAA >89 08/07/2012 1016    INR    Component Value Date/Time   INR 1.05 05/17/2009 0700     Intake/Output Summary (Last 24 hours) at 06/01/2018 6720 Last data filed at 06/01/2018 0458 Gross per 24 hour  Intake 2123.33 ml  Output 1255 ml  Net 868.33  ml     Assessment:  73 y.o. female is s/p:  1.  Right lower extremity thromboembolectomy 2.  Right common femoral artery patch angioplasty with Dacron  1 Day Post-Op  Plan: -pt with brisk doppler signals right foot.  Unable to wiggle toes and sensation is not in tact right foot.  Right calf and anterior compartments are soft and non tender. -discussed groin care with pt-needs to stay dry to help prevent wound infection.   -foley still in-uop was decreased and she was given fluid bolus.  uop picking up.  Creatinine up to 1.55 from 0.66.  Will recheck tomorrow.  Discontinue lisinopril and Mobic for now.   Received contrast a few days ago for CTA -most likely will need 2D echo -DVT prophylaxis:  Heparin gtt   Doreatha Massed, PA-C [facility] [phone] 06/01/2018 7:22 AM  I have independently interviewed patient and agree with PA assessment and plan above.  Continue heparin drip.  Continue to allow right lower extremity to demarcate.  Ritika Hellickson C. Randie Heinz, MD [facility] [place] Office: [phone] Pager: [phone]

## 2018-06-01 NOTE — Evaluation (Signed)
Physical Therapy Evaluation Patient Details Name: [name] MRN: [id number] DOB: 1945/09/16 Today's Date: 06/01/2018   History of Present Illness  73 yo female admitted 2/27 after having a fall with bilateral leg pain, weakness, and wheezing. CT revealed occlusion of R iliac limb bypass. Now s/p RIGHT FEMORAL THROMBECTOMY (Right ) PATCH ANGIOPLASTY RIGHT FEMORAL ARTERY WITH DECRON PATCH. Pmh of HLD, HTN, PVD, history of back surgeries.  Clinical Impression  Orders received for PT evaluation. Patient demonstrates deficits in functional mobility as indicated below. Will benefit from continued skilled PT to address deficits and maximize function. Will see as indicated and progress as tolerated.  Patient with some conflicting history and environmental information, unclear true availability of caregivers upon discharge. If patient has caregiver support recommend HHPT with supervision 24/7, other wise may need to consider post acute rehabilitation.     Follow Up Recommendations Home health PT;Supervision/Assistance - 24 hour    Equipment Recommendations  Rolling walker with 5" wheels    Recommendations for Other Services       Precautions / Restrictions Precautions Precautions: Fall      Mobility  Bed Mobility Overal bed mobility: Needs Assistance Bed Mobility: Supine to Sit     Supine to sit: Supervision     General bed mobility comments: increased time, supervision for line management  Transfers Overall transfer level: Needs assistance Equipment used: Rolling walker (2 wheeled);None Transfers: Sit to/from Stand Sit to Stand: Min guard         General transfer comment: impulsive, verbal cues for hand placement  Ambulation/Gait Ambulation/Gait assistance: Min guard Gait Distance (Feet): 3 Feet Assistive device: Rolling walker (2 wheeled)   Gait velocity: decreased       Stairs            Wheelchair Mobility    Modified Rankin (Stroke Patients Only)        Balance Overall balance assessment: Needs assistance Sitting-balance support: Feet supported;No upper extremity supported Sitting balance-Leahy Scale: Good Sitting balance - Comments: seated EOB   Standing balance support: During functional activity;Bilateral upper extremity supported Standing balance-Leahy Scale: Fair Standing balance comment: with RW                             Pertinent Vitals/Pain Pain Assessment: Faces Faces Pain Scale: Hurts even more Pain Location: right LE Pain Descriptors / Indicators: Grimacing;Guarding;Sore;Numbness Pain Intervention(s): Monitored during session;Repositioned    Home Living Family/patient expects to be discharged to:: Private residence Living Arrangements: Alone Available Help at Discharge: Family;Friend(s) Type of Home: House Home Access: Level entry     Home Layout: One level        Prior Function Level of Independence: Independent               Hand Dominance   Dominant Hand: Right    Extremity/Trunk Assessment   Upper Extremity Assessment Upper Extremity Assessment: Overall WFL for tasks assessed    Lower Extremity Assessment Lower Extremity Assessment: RLE deficits/detail;LLE deficits/detail RLE Deficits / Details: grossly 3/5 secondary to c/o pain from recent fall RLE: Unable to fully assess due to pain RLE Sensation: WNL;decreased light touch(reports numbness in distal RLE) LLE Deficits / Details: grossly 4/5 LLE Sensation: WNL(equal sensation to light touch bilaterally)       Communication   Communication: No difficulties  Cognition Arousal/Alertness: Awake/alert Behavior During Therapy: WFL for tasks assessed/performed Overall Cognitive Status: Impaired/Different from baseline Area of Impairment: Memory;Following commands;Safety/judgement;Awareness;Problem solving  Memory: Decreased short-term memory Following Commands: Follows one step commands with  increased time Safety/Judgement: Decreased awareness of safety;Decreased awareness of deficits Awareness: Emergent Problem Solving: Slow processing;Requires verbal cues;Requires tactile cues        General Comments      Exercises     Assessment/Plan    PT Assessment Patient needs continued PT services  PT Problem List Decreased strength;Decreased activity tolerance;Decreased balance;Decreased mobility       PT Treatment Interventions Gait training;Functional mobility training;Therapeutic activities;Therapeutic exercise;Balance training;Patient/family education    PT Goals (Current goals can be found in the Care Plan section)  Acute Rehab PT Goals Patient Stated Goal: return home PT Goal Formulation: With patient Time For Goal Achievement: 06/15/18 Potential to Achieve Goals: Good    Frequency Min 3X/week   Barriers to discharge        Co-evaluation               AM-PAC PT "6 Clicks" Mobility  Outcome Measure Help needed turning from your back to your side while in a flat bed without using bedrails?: None Help needed moving from lying on your back to sitting on the side of a flat bed without using bedrails?: None Help needed moving to and from a bed to a chair (including a wheelchair)?: A Little Help needed standing up from a chair using your arms (e.g., wheelchair or bedside chair)?: A Little Help needed to walk in hospital room?: A Little Help needed climbing 3-5 steps with a railing? : A Lot 6 Click Score: 19    End of Session Equipment Utilized During Treatment: Gait belt Activity Tolerance: Patient tolerated treatment well;Patient limited by fatigue Patient left: in chair;with call bell/phone within reach Nurse Communication: Mobility status PT Visit Diagnosis: Unsteadiness on feet (R26.81);Other abnormalities of gait and mobility (R26.89);Muscle weakness (generalized) (M62.81)    Time: 2119-4174 PT Time Calculation (min) (ACUTE ONLY): 19  min   Charges:   PT Evaluation $PT Eval Moderate Complexity: 1 Mod          Charlotte Crumb, PT DPT  Board Certified Neurologic Specialist Acute Rehabilitation Services Pager [phone] Office [phone]   Fabio Asa 06/01/2018, 9:16 AM

## 2018-06-01 NOTE — Progress Notes (Signed)
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 status post TLIF 2005 Status post cardiac cath 05/17/2009 with no specific disease Palpitations  Patient was admitted 2/27 after having a fall in the ED room while visiting her mom at [facility] had bilateral leg pain in the setting of her chronic back pain, wheezing, overall generalized weakness labs were normal and because of concern for dirty urine started on ceftriaxone CT angiogram chronic occlusion scar complete occlusion right iliac limb bypass with reconstitution of flow to right CFA  Found on the morning of 2/28 to have an acutely cold limb transferred emergently to [facility] for vascular eval   A & Plan 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 and CFA down SFA profundus femoris, ABI right 0.1 left 0.9  Thromboembolectomy performed 3/1+ patch angioplasty Further management as per vascular surgeon--currently still on heparin--compartments soft--hopeful for no further intervention Continue Lidoderm patch AKI 2/2 ATN  Contrast load vs transient hypotension and  Nephrotoxins  Holding lasix/mobic as per Vasc-start IVF 75 cc/h-labs in am Smoker Needs counseling about cessation previously on Chantix, continue nicotine patch Still on oxygen-bottomed out per patient without-desat screen needed Chronic low back pain status post surgery Spinal cord stimulator 2 years previously Continue Lyrica 200 3 times daily, Cymbalta 60 daily CT evidence pancreatitis No reports of abdominal pain-monitor Leukocytosis Multifactorial, possible left sinusitis-discontinue Keflex for now blood cultures negative Hyperlipidemia Continue fenofibrate 160 as well as Lipitor  40 Hypertension-uncontrolled Hold lisinopril 40 daily amlodipine 10, labetalol as needed and hydralazine as needed cont metoprolol 12.5 twice daily Mild hyponatremia Repeat a.m. labs   Lovenox Full code Await further input from vascular surgery Inpatient for now dependent on progress   [name], MD  [facility] Via [employer] app OR -[email or web address] 7PM-7AM contact night coverage as above 06/01/2018, 9:52 AM  LOS: 3 days     Interval history/Subjective:  Awake alert no distress eating and drinking no sob no fever Still has LE pain--this is her usual pain that is chronic per patient tells me oxygen dropped when she was sleeping Doesn't use at home   Objective:  Vitals:  Vitals:   06/01/18 0500 06/01/18 0818  BP: (!) 117/53 (!) 155/52  Pulse: 74 90  Resp: 14 17  Temp:    SpO2: 94% 95%    Exam:  Awake looks about stated age EOMI NCAT Chest is clear no rales no rhonchi On nasal oxygen Left great toe has some mottling-stable--calves are soft, DP felt bilaterally Power 5/5 Abdomen soft  I have personally reviewed the following:   Consultants:  Vascular surgery  Procedures: Procedure Performed 3/1 1.  Right lower extremity thromboembolectomy 2.  Right common femoral artery patch angioplasty with Dacron  Antimicrobials:  Perioperative biotics now off Keflex  DATA   Labs: No labs today  Imaging studies:  No   Review and summation of old records:  yes  Scheduled Meds: . amLODipine  10 mg Oral Daily  . atorvastatin  40 mg Oral Daily  . budesonide (PULMICORT) nebulizer solution  0.25 mg Nebulization BID  . docusate sodium  100 mg Oral Daily  . DULoxetine  60 mg Oral Daily  . fenofibrate  160 mg Oral Daily  . lidocaine  2 patch Transdermal Q24H  . metoprolol tartrate  12.5 mg Oral  BID  . nicotine  21 mg Transdermal Daily  . pantoprazole  40 mg Oral BID  . pregabalin  200 mg Oral TID   Continuous Infusions: . sodium chloride 75 mL/hr at  06/01/18 0856  . heparin Stopped (05/31/18 2358)  . magnesium sulfate 1 - 4 g bolus IVPB      Principal Problem:   General weakness Active Problems:   Hyperlipidemia with target LDL less than 100   TOBACCO ABUSE   Essential hypertension   Peripheral vascular disease (HCC)   GERD   Depression   Fall   Pancreatitis   LOS: 3 days

## 2018-06-01 NOTE — Progress Notes (Signed)
ANTICOAGULATION CONSULT NOTE - Follow Up Consult  Pharmacy Consult for Heparin Indication: acute limb ischemia  No Known Allergies  Patient Measurements: Height: 5\' 7"  (170.2 cm) Weight: 160 lb 4.4 oz (72.7 kg) IBW/kg (Calculated) : 61.6 Heparin Dosing Weight:  72 kg  Vital Signs: Temp: 97.7 F (36.5 C) (03/02 0401) Temp Source: Oral (03/02 0401) BP: 155/52 (03/02 0818) Pulse Rate: 90 (03/02 0818)  Labs: Recent Labs    05/30/18 0512 05/31/18 2239 06/01/18 0324  HGB 11.5*  --  10.1*  HCT 36.7  --  31.7*  PLT 392  --  386  HEPARINUNFRC  --  0.41 0.48  CREATININE 0.61  --  1.55*    Estimated Creatinine Clearance: 32.4 mL/min (A) (by C-G formula based on SCr of 1.55 mg/dL (H)).   Assessment: Anticoag: RLE bypass occlusion, Heparin level 0.41 and 0.48 both in goal range. Hgb 11.5>10.1 down. Plts 386 stable.  Goal of Therapy:  Heparin level 0.3-0.7 units/ml Monitor platelets by anticoagulation protocol: Yes   Plan:  -Continue heparin at 1100 units/hr  -Monitor CBC, daily HL and s/s of bleeding    Madgie Dhaliwal S. Merilynn Finland, PharmD, BCPS Clinical Staff Pharmacist Misty Stanley Stillinger 06/01/2018,9:22 AM

## 2018-06-01 NOTE — Care Management Important Message (Signed)
Important Message  Patient Details  Name: [name] MRN: [id number] Date of Birth: Jan 05, 1946   Medicare Important Message Given:  Yes    Sriya Kroeze P Philemon Riedesel 06/01/2018, 1:40 PM

## 2018-06-01 NOTE — Evaluation (Signed)
Occupational Therapy Evaluation Patient Details Name: [name] MRN: [id number] DOB: 1945/09/23 Today's Date: 06/01/2018    History of Present Illness 73 yo female admitted 2/27 after having a fall with bilateral leg pain, weakness, and wheezing. CT revealed occlusion of R iliac limb bypass. Now s/p RIGHT FEMORAL THROMBECTOMY (Right ) PATCH ANGIOPLASTY RIGHT FEMORAL ARTERY WITH DECRON PATCH. Pmh of HLD, HTN, PVD, history of back surgeries.   Clinical Impression   PTA, pt was living alone and was independent. Pt reporting her daughters live near her and may be able to stay at dc, but she is unsure. Pt currently requires Min A for LB ADLs and Min Guard-Min A for functional transfers with RW. Pt limited by pain at RLE. Pt would benefit from further acute OT to facilitate safe dc. Recommend dc to home with HHOT for further OT to optimize safety, independence with ADLs, and return to PLOF.      Follow Up Recommendations  Home health OT;Supervision/Assistance - 24 hour    Equipment Recommendations  Other (comment);3 in 1 bedside commode(Confirm DME due to conflicting information)    Recommendations for Other Services PT consult     Precautions / Restrictions Precautions Precautions: Fall      Mobility Bed Mobility Overal bed mobility: Needs Assistance Bed Mobility: Supine to Sit     Supine to sit: Supervision     General bed mobility comments: increased time, supervision for line management  Transfers Overall transfer level: Needs assistance Equipment used: Rolling walker (2 wheeled);None Transfers: Sit to/from [employer] to Stand: Min guard Stand pivot transfers: Min assist       General transfer comment: Min Guard-Min A for safety and balance. Pt requiring cues for hand placement.     Balance Overall balance assessment: Needs assistance Sitting-balance support: Feet supported;No upper extremity supported Sitting balance-Leahy Scale:  Good Sitting balance - Comments: Able to don left sock   Standing balance support: During functional activity;Bilateral upper extremity supported Standing balance-Leahy Scale: Poor Standing balance comment: Reliant on UE support at RW due to pain                           ADL either performed or assessed with clinical judgement   ADL Overall ADL's : Needs assistance/impaired Eating/Feeding: Set up;Supervision/ safety;Sitting   Grooming: Set up;Supervision/safety;Sitting   Upper Body Bathing: Min guard;Sitting   Lower Body Bathing: Minimal assistance;Sit to/from stand   Upper Body Dressing : Min guard;Sitting   Lower Body Dressing: Minimal assistance;Sit to/from stand Lower Body Dressing Details (indicate cue type and reason): Donning right sock for pain. Pt able to use figure four method for donning left sock. Min A for safety in standing.  Toilet Transfer: Minimal assistance;Stand-pivot;RW(simulated to recliner)           Functional mobility during ADLs: Minimal assistance;Rolling walker(stand pivot to recliner) General ADL Comments: Pt limited by pain     Vision         Perception     Praxis      Pertinent Vitals/Pain Pain Assessment: Faces Faces Pain Scale: Hurts even more Pain Location: right LE Pain Descriptors / Indicators: Grimacing;Guarding;Sore;Numbness Pain Intervention(s): Limited activity within patient's tolerance;Monitored during session;Repositioned;Patient requesting pain meds-RN notified     Hand Dominance Right   Extremity/Trunk Assessment Upper Extremity Assessment Upper Extremity Assessment: Overall WFL for tasks assessed   Lower Extremity Assessment Lower Extremity Assessment: Defer to PT evaluation RLE Deficits /  Details: grossly 3/5 secondary to c/o pain from recent fall RLE: Unable to fully assess due to pain RLE Sensation: WNL;decreased light touch(reports numbness in distal RLE) LLE Deficits / Details: grossly 4/5 LLE  Sensation: WNL(equal sensation to light touch bilaterally)   Cervical / Trunk Assessment Cervical / Trunk Assessment: Normal   Communication Communication Communication: No difficulties   Cognition Arousal/Alertness: Awake/alert Behavior During Therapy: WFL for tasks assessed/performed Overall Cognitive Status: Impaired/Different from baseline Area of Impairment: Memory;Following commands;Safety/judgement;Awareness;Problem solving                     Memory: Decreased short-term memory Following Commands: Follows one step commands with increased time Safety/Judgement: Decreased awareness of safety;Decreased awareness of deficits Awareness: Emergent Problem Solving: Slow processing;Requires verbal cues;Requires tactile cues General Comments: Requiring increased time and cues during session.    General Comments  SpO2 dropping to 85% on RA. RN notified    Exercises     Shoulder Instructions      Home Living Family/patient expects to be discharged to:: Private residence Living Arrangements: Alone Available Help at Discharge: Family;Friend(s) Type of Home: House Home Access: Level entry     Home Layout: One level     Bathroom Shower/Tub: Chief Strategy OfficerTub/shower unit   Bathroom Toilet: Handicapped height Bathroom Accessibility: Yes   Home Equipment: None   Additional Comments: Pt reporting no DME. However, prior PT session says she had a cane, BSC, and wheelchair.      Prior Functioning/Environment Level of Independence: Independent        Comments: Pt reports she was performing ADLs, IADLs, and driving        OT Problem List: Decreased strength;Decreased activity tolerance;Decreased range of motion;Impaired balance (sitting and/or standing);Decreased knowledge of use of DME or AE;Decreased knowledge of precautions;Pain      OT Treatment/Interventions: Self-care/ADL training;Therapeutic exercise;Energy conservation;DME and/or AE instruction;Therapeutic  activities;Patient/family education    OT Goals(Current goals can be found in the care plan section) Acute Rehab OT Goals Patient Stated Goal: return home OT Goal Formulation: With patient Time For Goal Achievement: 06/15/18 Potential to Achieve Goals: Good  OT Frequency: Min 2X/week   Barriers to D/C:            Co-evaluation PT/OT/SLP Co-Evaluation/Treatment: Yes Reason for Co-Treatment: For patient/therapist safety;To address functional/ADL transfers   OT goals addressed during session: ADL's and self-care      AM-PAC OT "6 Clicks" Daily Activity     Outcome Measure Help from another person eating meals?: None Help from another person taking care of personal grooming?: A Little Help from another person toileting, which includes using toliet, bedpan, or urinal?: A Little Help from another person bathing (including washing, rinsing, drying)?: A Little Help from another person to put on and taking off regular upper body clothing?: A Little Help from another person to put on and taking off regular lower body clothing?: A Little 6 Click Score: 19   End of Session Equipment Utilized During Treatment: Rolling walker;Oxygen Nurse Communication: Mobility status  Activity Tolerance: Patient tolerated treatment well Patient left: in chair;with call bell/phone within reach  OT Visit Diagnosis: Unsteadiness on feet (R26.81);Other abnormalities of gait and mobility (R26.89);Muscle weakness (generalized) (M62.81);Pain Pain - Right/Left: Right Pain - part of body: Leg                Time: [id number] OT Time Calculation (min): 15 min Charges:  OT General Charges $OT Visit: 1 Visit OT Evaluation $OT Eval Moderate Complexity: 1 Mod  Jurgen Groeneveld MSOT, OTR/L Acute Rehab Pager: [phone] Office: [phone]  Theodoro Grist Waverley Krempasky 06/01/2018, 11:23 AM

## 2018-06-01 NOTE — Progress Notes (Signed)
ABI has been completed.   Preliminary results in CV Proc.   Blanch Media 06/01/2018 11:39 AM

## 2018-06-01 NOTE — Progress Notes (Signed)
Pt's urine output was 175 cc.  A bladder scan showed 0 cc of urine in the bladder.  The pt did not drink much the day before and was only on a heparin drip.  The on call hospitalist was informed and ordered a one time IV normal saline 500 cc bolus.  Will continue to monitor.  Harriet Masson, RN

## 2018-06-02 LAB — RENAL FUNCTION PANEL
Albumin: 2.3 g/dL — ABNORMAL LOW (ref 3.5–5.0)
Anion gap: 8 (ref 5–15)
BUN: 25 mg/dL — ABNORMAL HIGH (ref 8–23)
CO2: 25 mmol/L (ref 22–32)
Calcium: 8.3 mg/dL — ABNORMAL LOW (ref 8.9–10.3)
Chloride: 103 mmol/L (ref 98–111)
Creatinine, Ser: 0.95 mg/dL (ref 0.44–1.00)
GFR calc Af Amer: >60 mL/min (ref >60)
GFR calc non Af Amer: >60 mL/min (ref >60)
Glucose, Bld: 73 mg/dL (ref 70–99)
Phosphorus: 2.1 mg/dL — ABNORMAL LOW (ref 2.5–4.6)
Potassium: 3.8 mmol/L (ref 3.5–5.1)
SODIUM: 136 mmol/L (ref 135–145)

## 2018-06-02 LAB — CBC WITH DIFFERENTIAL/PLATELET
Abs Immature Granulocytes: 0.18 10*3/uL — ABNORMAL HIGH (ref 0.00–0.07)
BASOS ABS: 0 10*3/uL (ref 0.0–0.1)
Basophils Relative: 0 %
EOS PCT: 0 %
Eosinophils Absolute: 0 10*3/uL (ref 0.0–0.5)
HCT: 28.9 % — ABNORMAL LOW (ref 36.0–46.0)
Hemoglobin: 8.9 g/dL — ABNORMAL LOW (ref 12.0–15.0)
Immature Granulocytes: 1 %
Lymphocytes Relative: 13 %
Lymphs Abs: 2.9 10*3/uL (ref 0.7–4.0)
MCH: 26.3 pg (ref 26.0–34.0)
MCHC: 30.8 g/dL (ref 30.0–36.0)
MCV: 85.3 fL (ref 80.0–100.0)
Monocytes Absolute: 1.6 10*3/uL — ABNORMAL HIGH (ref 0.1–1.0)
Monocytes Relative: 7 %
Neutro Abs: 18 10*3/uL — ABNORMAL HIGH (ref 1.7–7.7)
Neutrophils Relative %: 79 %
Platelets: 379 10*3/uL (ref 150–400)
RBC: 3.39 MIL/uL — ABNORMAL LOW (ref 3.87–5.11)
RDW: 15.7 % — AB (ref 11.5–15.5)
WBC: 22.8 10*3/uL — ABNORMAL HIGH (ref 4.0–10.5)
nRBC: 0.3 % — ABNORMAL HIGH (ref 0.0–0.2)

## 2018-06-02 LAB — HEPARIN LEVEL (UNFRACTIONATED)
Heparin Unfractionated: 0.21 IU/mL — ABNORMAL LOW (ref 0.30–0.70)
Heparin Unfractionated: 0.21 IU/mL — ABNORMAL LOW (ref 0.30–0.70)

## 2018-06-02 MED ORDER — METOCLOPRAMIDE HCL 5 MG PO TABS
5.0000 mg | ORAL_TABLET | Freq: Three times a day (TID) | ORAL | Status: DC
  Administered 2018-06-02 – 2018-06-04 (×7): 5 mg via ORAL
  Filled 2018-06-02 (×7): qty 1

## 2018-06-02 MED ORDER — SENNOSIDES-DOCUSATE SODIUM 8.6-50 MG PO TABS
2.0000 | ORAL_TABLET | Freq: Every evening | ORAL | Status: DC | PRN
  Administered 2018-06-02: 2 via ORAL
  Filled 2018-06-02: qty 2

## 2018-06-02 NOTE — Progress Notes (Signed)
  Progress Note    06/02/2018 4:46 PM 2 Days Post-Op  Subjective: Has some sensation in right lower leg  Vitals:   06/02/18 1000 06/02/18 1336  BP: (!) 130/47 (!) 141/51  Pulse: 72 83  Resp: (!) 21 (!) 24  Temp:  97.7 F (36.5 C)  SpO2: 92% 95%    Physical Exam: Awake alert oriented Right groin clean dry intact Palpable posterior tibial pulse on the right strong left dorsalis pedis signal Stable dusky changes left toes Foot drop on the right  CBC    Component Value Date/Time   WBC 22.8 (H) 06/02/2018 0237   RBC 3.39 (L) 06/02/2018 0237   HGB 8.9 (L) 06/02/2018 0237   HGB 12.5 05/22/2018 1039   HCT 28.9 (L) 06/02/2018 0237   HCT 39.4 05/22/2018 1039   PLT 379 06/02/2018 0237   PLT 437 05/22/2018 1039   MCV 85.3 06/02/2018 0237   MCV 85 05/22/2018 1039   MCH 26.3 06/02/2018 0237   MCHC 30.8 06/02/2018 0237   RDW 15.7 (H) 06/02/2018 0237   RDW 14.5 05/22/2018 1039   LYMPHSABS 2.9 06/02/2018 0237   LYMPHSABS 1.7 05/22/2018 1039   MONOABS 1.6 (H) 06/02/2018 0237   EOSABS 0.0 06/02/2018 0237   EOSABS 0.3 05/22/2018 1039   BASOSABS 0.0 06/02/2018 0237   BASOSABS 0.1 05/22/2018 1039    BMET    Component Value Date/Time   NA 136 06/02/2018 0237   NA 142 05/22/2018 1039   K 3.8 06/02/2018 0237   CL 103 06/02/2018 0237   CO2 25 06/02/2018 0237   GLUCOSE 73 06/02/2018 0237   BUN 25 (H) 06/02/2018 0237   BUN 10 05/22/2018 1039   CREATININE 0.95 06/02/2018 0237   CREATININE 0.72 08/07/2012 1016   CALCIUM 8.3 (L) 06/02/2018 0237   GFRNONAA >60 06/02/2018 0237   GFRNONAA 88 08/07/2012 1016   GFRAA >60 06/02/2018 0237   GFRAA >89 08/07/2012 1016    INR    Component Value Date/Time   INR 1.05 05/17/2009 0700     Intake/Output Summary (Last 24 hours) at 06/02/2018 1646 Last data filed at 06/02/2018 1500 Gross per 24 hour  Intake 2857.1 ml  Output 900 ml  Net 1957.1 ml     Assessment:  Assessment:  73 y.o. female is s/p:  1.Right lower extremity  thromboembolectomy 2.Right common femoral artery patch angioplasty with Dacron   2 Days Post-Op  Plan: We will need full dose anticoagulation. I have ordered foot drop boot   Max Romano C. Randie Heinz, MD [facility] Office: [phone] Pager: [phone]  06/02/2018 4:46 PM

## 2018-06-02 NOTE — Progress Notes (Signed)
Patient's pain level has continued to decrease throughout the night. During 0400 VS check, patient anterior thigh area and calf were soft and skin was not tender to touch. DP pulse in right foot was detectable. Will continue to monitor. Victorino December, RN

## 2018-06-02 NOTE — Progress Notes (Signed)
Orthopedic Tech Progress Note Patient Details:  [name] 31-Oct-1945 [id number]  Patient ID: [name], female   DOB: 04-28-1945, 73 y.o.   MRN: [id number]   Saul Fordyce 06/02/2018, 10:44 AMCalled [employer] for right Ankle foot Orthosis.

## 2018-06-02 NOTE — Consult Note (Signed)
            [facility] CM Primary Care Navigator  06/02/2018  [name] 1946/03/31 [id number]   Went to seepatient at the bedside to identify possible discharge needs.  Patient was admitted after having a fall in the ED while visiting her mother at [facility] and had bilateral leg pain in the setting of her chronic back pain, wheezing, overall generalized weakness, was noted to have an acutely cold limb, transferred emergently to [facility] for vascular evaluation. (acute/ chronic aorto bifemoral right limb and clot status post right lower extremity thromboembolectomy)  Patient endorsesChristy Lendon Colonel, NP with [name] as herprimary care provider.   [facility] [place] [facility].   Patientstatesthatshemanagesher own medications at home straight out of the containers.   Patientverbalizedthat she has been drivingprior to admission but her cousin [name]) or brother [name]) will be able to provide transportation to her doctors' appointments if needed after discharge.  Patientstates that her daughter [name] closeby) will serve as her primary caregiver at home when needed.  Anticipated discharge plan is home with home health services per therapy recommendation. Patient mentioned she may be staying at her daughter's (nurse) house to recover upon discharge .  Patientvoiced understanding to call primary care provider's office for a post discharge follow-up appointment within1- 2 weeks orsooner if needs arise. Patient letter (with PCP's contact number) wasprovided asa her reminder.   Discussed with patientregarding [facility] services available for health management andresourcesat home but she denies current needs orconcernsat thispoint. Patient reports that she has been monitoring daily blood pressure but would need to record results to bring to her provider on her  follow-up visit and also to adhere with diet restriction. Patientexpressed understanding of needto seekreferral from primary care provider to [facility] care management ifdeemed necessary and appropriate for any services in thenearfuture.  [facility] care management information was provided for futureneeds that shemay have.  Patienthowever,verbally agreedand optedfor EMMI calls to follow-up withherrecovery at home.   Referral made for [facility] General calls after discharge.  Primary care provider's office is listed as providing transition of care.   For additional questions please contact:  [name], BSN, RN-BC [facility] PRIMARY CARE Navigator Cell: [phone]

## 2018-06-02 NOTE — Progress Notes (Addendum)
Patient complained of pain 8/10 at R groin incision and in R upper thigh. Patient given 1mg  dilaudid at 101. Patient given 1mg  dilaudid again at 2200 for pain 7/10. Pt stated pain had eased off but wanted something more for pain. Right anterior thigh and right calf both soft to palpation. Leg is warm and dry. Foot is cool and dry. Right DP pulse dopplerable. Pt sleeps mostly uncovered and room is cool. Will continue to monitor. Victorino December, RN

## 2018-06-02 NOTE — Progress Notes (Signed)
Patient foley catheter removed per order. Patient tolerated procedure well. Peri care performed after removal. Patient educated on voiding and to call for help when she feels that she has to urinate. Will continue to monitor. Victorino December, RN

## 2018-06-02 NOTE — Progress Notes (Signed)
ANTICOAGULATION CONSULT NOTE - Follow Up Consult  Pharmacy Consult for Heparin Indication: acute limb ischemia  No Known Allergies  Patient Measurements: Height: 5\' 7"  (170.2 cm) Weight: 160 lb 4.4 oz (72.7 kg) IBW/kg (Calculated) : 61.6 Heparin Dosing Weight:  72 kg  Vital Signs: Temp: 98.4 F (36.9 C) (03/03 0343) Temp Source: Oral (03/03 0343) BP: 151/53 (03/03 0700) Pulse Rate: 73 (03/03 0700)  Labs: Recent Labs    05/31/18 2239 06/01/18 0324 06/02/18 0237  HGB  --  10.1* 8.9*  HCT  --  31.7* 28.9*  PLT  --  386 379  HEPARINUNFRC 0.41 0.48 0.21*  CREATININE  --  1.55* 0.95    Estimated Creatinine Clearance: 52.8 mL/min (by C-G formula based on SCr of 0.95 mg/dL).   Assessment: Anticoag: RLE bypass occlusion.  Heparin level low this morning at 0.21. Hgb 11.5>10.1>8.9 down. Plts stable. No bleeding issues noted.   Goal of Therapy:  Heparin level 0.3-0.7 units/ml Monitor platelets by anticoagulation protocol: Yes   Plan:  -Increase heparin to 1250 units/hr  -Monitor CBC, daily HL and s/s of bleeding    Sheppard Coil PharmD., BCPS Clinical Pharmacist 06/02/2018 8:43 AM

## 2018-06-02 NOTE — Progress Notes (Signed)
ANTICOAGULATION CONSULT NOTE - Follow Up Consult  Pharmacy Consult for Heparin Indication: acute limb ischemia   Patient Measurements: Height: 5\' 7"  (170.2 cm) Weight: 160 lb 4.4 oz (72.7 kg) IBW/kg (Calculated) : 61.6 Heparin Dosing Weight:  72 kg  Vital Signs: Temp: 97.7 F (36.5 C) (03/03 1336) Temp Source: Oral (03/03 1336) BP: 141/51 (03/03 1336) Pulse Rate: 83 (03/03 1336)  Labs: Recent Labs    06/01/18 0324 06/02/18 0237 06/02/18 1650  HGB 10.1* 8.9*  --   HCT 31.7* 28.9*  --   PLT 386 379  --   HEPARINUNFRC 0.48 0.21* 0.21*  CREATININE 1.55* 0.95  --      Assessment: RLE bypass occlusion s/p thomboembolectomy. Patient is on heparin infusion, heparin level remains SUBtherapeutic despite increase in rate this morning. hgb is down slightly 12 > 8.9. plts ok.    Goal of Therapy:  Heparin level 0.3-0.7 units/ml Monitor platelets by anticoagulation protocol: Yes   Plan:  -Increase heparin to 1450 units/hr  -Monitor CBC, daily HL and s/s of bleeding    Baldemar Friday 06/02/2018 5:51 PM

## 2018-06-02 NOTE — Progress Notes (Signed)
Physical Therapy Treatment Patient Details Name: [name] MRN: [id number] DOB: 1945-05-22 Today's Date: 06/02/2018    History of Present Illness 73 yo female admitted 2/27 after having a fall with bilateral leg pain, weakness, and wheezing. CT revealed occlusion of R iliac limb bypass. Now s/p RIGHT FEMORAL THROMBECTOMY (Right ) PATCH ANGIOPLASTY RIGHT FEMORAL ARTERY WITH DECRON PATCH. Pmh of HLD, HTN, PVD, history of back surgeries.    PT Comments    Patient seen for activity progression and mobility. Patient tolerated ambulated with use of RW and increased physical assist. Patient remains limited in overall function and given poor overall activity tolerance in conjunction with significant RLE deficits impeding gait and function, feel patient would be most appropriate for post acute rehabilitation. Spoke with patient regarding recommendations for rehabilitation and patient in agreement. Will update recommendations and continue to progress as tolerated.  OF NOTE: saturations stable on room air with mobility today>90% throughout with nadir of 92%   Follow Up Recommendations  SNF;Supervision/Assistance - 24 hour     Equipment Recommendations  Rolling walker with 5" wheels    Recommendations for Other Services       Precautions / Restrictions Precautions Precautions: Fall    Mobility  Bed Mobility Overal bed mobility: Needs Assistance Bed Mobility: Supine to Sit     Supine to sit: Supervision     General bed mobility comments: increased time, supervision for line management  Transfers Overall transfer level: Needs assistance Equipment used: Rolling walker (2 wheeled);None Transfers: Sit to/from [employer] to Stand: Min guard Stand pivot transfers: Min assist       General transfer comment: Min Guard-Min A for safety and balance. Pt requiring cues for hand placement.   Ambulation/Gait Ambulation/Gait assistance: Min assist;Mod assist Gait  Distance (Feet): 24 Feet Assistive device: Rolling walker (2 wheeled) Gait Pattern/deviations: Decreased step length - right;Decreased step length - left;Decreased stride length;Decreased dorsiflexion - right Gait velocity: decreased   General Gait Details: altered gait pattern with decreased ankle dorsiflexion and noted ataxic movement of RLE. Poorly coordinated stride with significant multi modal cues for sequencing and transitional gait. Heavy relaince on RW for UE support and min assist with VCs for sequencing progressing to moderate physical assist upon fatigue.   Stairs             Wheelchair Mobility    Modified Rankin (Stroke Patients Only)       Balance Overall balance assessment: Needs assistance Sitting-balance support: Feet supported;No upper extremity supported Sitting balance-Leahy Scale: Good     Standing balance support: During functional activity;Bilateral upper extremity supported Standing balance-Leahy Scale: Poor Standing balance comment: heavy reliance on UE support                            Cognition Arousal/Alertness: Awake/alert Behavior During Therapy: WFL for tasks assessed/performed Overall Cognitive Status: Within Functional Limits for tasks assessed                                 General Comments: improved overall cognition today      Exercises      General Comments        Pertinent Vitals/Pain Pain Assessment: Faces Faces Pain Scale: Hurts even more Pain Location: right LE Pain Descriptors / Indicators: Grimacing;Guarding;Sore;Numbness    Home Living  Prior Function            PT Goals (current goals can now be found in the care plan section) Acute Rehab PT Goals Patient Stated Goal: return home PT Goal Formulation: With patient Time For Goal Achievement: 06/15/18 Potential to Achieve Goals: Good Progress towards PT goals: Progressing toward goals     Frequency    Min 3X/week      PT Plan Discharge plan needs to be updated    Co-evaluation              AM-PAC PT "6 Clicks" Mobility   Outcome Measure  Help needed turning from your back to your side while in a flat bed without using bedrails?: None Help needed moving from lying on your back to sitting on the side of a flat bed without using bedrails?: None Help needed moving to and from a bed to a chair (including a wheelchair)?: A Little Help needed standing up from a chair using your arms (e.g., wheelchair or bedside chair)?: A Lot Help needed to walk in hospital room?: A Lot Help needed climbing 3-5 steps with a railing? : Total 6 Click Score: 16    End of Session Equipment Utilized During Treatment: Gait belt Activity Tolerance: Patient tolerated treatment well;Patient limited by fatigue Patient left: in bed;with call bell/phone within reach;with family/visitor present Nurse Communication: Mobility status PT Visit Diagnosis: Unsteadiness on feet (R26.81);Other abnormalities of gait and mobility (R26.89);Muscle weakness (generalized) (M62.81)     Time: 8527-7824 PT Time Calculation (min) (ACUTE ONLY): 22 min  Charges:  $Gait Training: 8-22 mins                     Charlotte Crumb, PT DPT  Board Certified Neurologic Specialist Acute Rehabilitation Services Pager [phone] Office [phone]    Fabio Asa 06/02/2018, 3:54 PM

## 2018-06-02 NOTE — Progress Notes (Signed)
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 status post TLIF 2005 Status post cardiac cath 05/17/2009 with no specific disease Palpitations  Patient was admitted 2/27 after having a fall in the ED room while visiting her mom at [facility] had bilateral leg pain in the setting of her chronic back pain, wheezing, overall generalized weakness labs were normal and because of concern for dirty urine started on ceftriaxone CT angiogram chronic occlusion scar complete occlusion right iliac limb bypass with reconstitution of flow to right CFA  Found on the morning of 2/28 to have an acutely cold limb transferred emergently to [facility] for vascular eval   A & Plan 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 and CFA down SFA profundus femoris, ABI right 0.1 left 0.9  Thromboembolectomy performed 3/1+  Patch angioplasty Further management as per vascular surgeon-- heparin--compartments soft--ABI done 3/2 again Continue Lidoderm patch AKI 2/2 ATN  Improved to some degree   Holding lasix/mobic as per Vasc-cont IVF 75  cc/h Smoker Needs counseling about cessation previously on Chantix, continue nicotine patch Still on oxygen-bottomed out per patient without-desat screen needed Chronic low back pain status post surgery Spinal cord stimulator 2 years previously Continue Lyrica 200 3 times daily, Cymbalta 60 daily CT evidence pancreatitis No reports of abdominal pain-monitor Leukocytosis Multifactorial, possible left sinusitis-discontinue Keflex for now blood cultures negative Hyperlipidemia Continue fenofibrate 160 as well as Lipitor 40 Hypertension-uncontrolled Hold lisinopril 40 daily amlodipine 10, labetalol as needed and  hydralazine as needed cont metoprolol 12.5 twice daily Mild hyponatremia ressolved   Lovenox Full code Await further input from vascular surgery Inpatient for now dependent on progress   Estelene Carmack, MD  [facility] Via [employer] app OR [email or web address] 7PM-7AM contact night coverage as above 06/02/2018, 1:07 PM  LOS: 4 days     Interval history/Subjective:   no cp no fever Having nausea and only ate an ICEE today No vomit + flatus   Objective:  Vitals:  Vitals:   06/02/18 0914 06/02/18 1000  BP: 107/68 (!) 130/47  Pulse: 75 72  Resp: 16 (!) 21  Temp:    SpO2: 93% 92%    Exam:  Awake no distress EOMI NCAT Chest is clear no rales no rhonchi On nasal oxygen Left great toe has some mottling--some mottling on 2nd toe in addition Power 5/5 Abdomen soft  I have personally reviewed the following:   Consultants:  Vascular surgery  Procedures: Procedure Performed 3/1 1.  Right lower extremity thromboembolectomy 2.  Right common femoral artery patch angioplasty with Dacron  Repeat ABI 3/2 Summary: Right: Resting right ankle-brachial index indicates moderate right lower extremity arterial disease.  Left: Resting left ankle-brachial index indicates moderate left lower extremity arterial disease.   Antimicrobials:  Perioperative biotics now off Keflex  DATA   Labs: Bun creat down to 25/0.9 from peak of 20/1.5  Imaging studies:  No  Review and summation of old records:  yes  Scheduled Meds: . amLODipine  10 mg Oral Daily  . atorvastatin  40 mg Oral Daily  . budesonide (PULMICORT) nebulizer solution  0.25 mg Nebulization BID  . docusate sodium  100 mg Oral Daily  . DULoxetine  60 mg Oral Daily  . fenofibrate  160 mg Oral Daily  . lidocaine  2 patch Transdermal Q24H  .  metoCLOPramide  5 mg Oral TID AC  . metoprolol tartrate  12.5 mg Oral BID  . nicotine  21 mg Transdermal Daily  . pantoprazole  40 mg Oral BID  . pregabalin  200 mg Oral  TID   Continuous Infusions: . sodium chloride 75 mL/hr at 06/02/18 1230  . heparin 1,250 Units/hr (06/02/18 0851)  . magnesium sulfate 1 - 4 g bolus IVPB      Principal Problem:   General weakness Active Problems:   Hyperlipidemia with target LDL less than 100   TOBACCO ABUSE   Essential hypertension   Peripheral vascular disease (HCC)   GERD   Depression   Fall   Pancreatitis   LOS: 4 days

## 2018-06-03 ENCOUNTER — Inpatient Hospital Stay (HOSPITAL_COMMUNITY): Payer: Medicare Other

## 2018-06-03 LAB — URINALYSIS, ROUTINE W REFLEX MICROSCOPIC
Bilirubin Urine: NEGATIVE
Glucose, UA: NEGATIVE mg/dL
Ketones, ur: 5 mg/dL — AB
Nitrite: NEGATIVE
Protein, ur: NEGATIVE mg/dL
Specific Gravity, Urine: 1.014 (ref 1.005–1.030)
pH: 5 (ref 5.0–8.0)

## 2018-06-03 LAB — CULTURE, BLOOD (ROUTINE X 2)
Culture: NO GROWTH
Culture: NO GROWTH
SPECIAL REQUESTS: ADEQUATE
Special Requests: ADEQUATE

## 2018-06-03 LAB — CBC
HCT: 26.6 % — ABNORMAL LOW (ref 36.0–46.0)
HEMOGLOBIN: 8.2 g/dL — AB (ref 12.0–15.0)
MCH: 26.7 pg (ref 26.0–34.0)
MCHC: 30.8 g/dL (ref 30.0–36.0)
MCV: 86.6 fL (ref 80.0–100.0)
Platelets: 375 10*3/uL (ref 150–400)
RBC: 3.07 MIL/uL — ABNORMAL LOW (ref 3.87–5.11)
RDW: 15.9 % — ABNORMAL HIGH (ref 11.5–15.5)
WBC: 26.1 10*3/uL — ABNORMAL HIGH (ref 4.0–10.5)
nRBC: 1.1 % — ABNORMAL HIGH (ref 0.0–0.2)

## 2018-06-03 LAB — HEPARIN LEVEL (UNFRACTIONATED): Heparin Unfractionated: 0.34 IU/mL (ref 0.30–0.70)

## 2018-06-03 MED ORDER — SODIUM CHLORIDE 0.9 % IV SOLN
100.0000 mg | Freq: Two times a day (BID) | INTRAVENOUS | Status: DC
  Filled 2018-06-03 (×2): qty 100

## 2018-06-03 MED ORDER — IPRATROPIUM-ALBUTEROL 0.5-2.5 (3) MG/3ML IN SOLN
3.0000 mL | Freq: Four times a day (QID) | RESPIRATORY_TRACT | Status: DC
  Administered 2018-06-03 (×2): 3 mL via RESPIRATORY_TRACT
  Filled 2018-06-03 (×2): qty 3

## 2018-06-03 MED ORDER — SODIUM CHLORIDE 0.9 % IV SOLN
100.0000 mg | Freq: Two times a day (BID) | INTRAVENOUS | Status: DC
  Administered 2018-06-03 – 2018-06-08 (×10): 100 mg via INTRAVENOUS
  Filled 2018-06-03 (×10): qty 100

## 2018-06-03 MED ORDER — IPRATROPIUM-ALBUTEROL 0.5-2.5 (3) MG/3ML IN SOLN
3.0000 mL | Freq: Three times a day (TID) | RESPIRATORY_TRACT | Status: DC
  Administered 2018-06-04 – 2018-06-06 (×9): 3 mL via RESPIRATORY_TRACT
  Filled 2018-06-03 (×9): qty 3

## 2018-06-03 MED ORDER — SODIUM CHLORIDE 0.9 % IV SOLN
1.0000 g | INTRAVENOUS | Status: DC
  Administered 2018-06-03 – 2018-06-07 (×5): 1 g via INTRAVENOUS
  Filled 2018-06-03 (×6): qty 10

## 2018-06-03 MED ORDER — METHYLPREDNISOLONE SODIUM SUCC 40 MG IJ SOLR
40.0000 mg | Freq: Four times a day (QID) | INTRAMUSCULAR | Status: DC
  Administered 2018-06-03 – 2018-06-05 (×7): 40 mg via INTRAVENOUS
  Filled 2018-06-03 (×8): qty 1

## 2018-06-03 NOTE — Progress Notes (Signed)
PT Cancellation Note  Patient Details Name: [name] MRN: [id number] DOB: [birth year]   Cancelled Treatment:    Reason Eval/Treat Not Completed: Other (comment). Pt declining physical therapy session due to fatigue after recently returning to bed.  Laurina Bustle, PT, DPT Acute Rehabilitation Services Pager [phone] Office [phone]    Vanetta Mulders 06/03/2018, 2:42 PM

## 2018-06-03 NOTE — Progress Notes (Signed)
CSW talked with the patient's daughter, [name] and she suggested [facility] for placement. Patient daughter states [facility] is closer to her home and the patient has a family member that works there. CSW sent referral to [facility].   Antony Blackbird, MSW, [facility] Clinical Social Worker [phone]

## 2018-06-03 NOTE — Progress Notes (Signed)
ANTICOAGULATION CONSULT NOTE - Follow Up Consult  Pharmacy Consult for Heparin Indication: acute limb ischemia   Patient Measurements: Height: 5\' 7"  (170.2 cm) Weight: 170 lb 3.1 oz (77.2 kg) IBW/kg (Calculated) : 61.6 Heparin Dosing Weight:  72 kg  Vital Signs: Temp: 98.6 F (37 C) (03/03 1938) Temp Source: Oral (03/03 1938) BP: 138/58 (03/03 2300) Pulse Rate: 74 (03/03 2300)  Labs: Recent Labs    06/01/18 0324 06/02/18 0237 06/02/18 1650 06/03/18 0231  HGB 10.1* 8.9*  --  8.2*  HCT 31.7* 28.9*  --  26.6*  PLT 386 379  --  375  HEPARINUNFRC 0.48 0.21* 0.21* 0.34  CREATININE 1.55* 0.95  --   --      Assessment: RLE bypass occlusion s/p thomboembolectomy. Patient is on heparin infusion, heparin level this am therapeutic 0.34 units/ml. Hgb is down to 8.2, PTLC 375   Goal of Therapy:  Heparin level 0.3-0.7 units/ml Monitor platelets by anticoagulation protocol: Yes   Plan:  -Continue heparin at 1450 units/hr  -Monitor CBC, daily HL and s/s of bleeding    Woodfin Ganja 06/03/2018 3:35 AM

## 2018-06-03 NOTE — Clinical Social Work Note (Addendum)
Clinical Social Work Assessment  Patient Details  Name: [name] MRN: [id number] Date of Birth: 1945/05/29  Date of referral:  06/03/18               Reason for consult:  Facility Placement                Permission sought to share information with:  Facility Medical sales representative, Family Supports, Case Manager Permission granted to share information::  Yes, Verbal Permission Granted  Name::     [name]   Agency::  SNFs  Relationship::  daughter   Contact Information:  [phone]  Housing/Transportation Living arrangements for the past 2 months:  Single Family Home Source of Information:  Patient Patient Interpreter Needed:  None Criminal Activity/Legal Involvement Pertinent to Current Situation/Hospitalization:  No - Comment as needed Significant Relationships:  Adult Children Lives with:  Self Do you feel safe going back to the place where you live?  No(agrees to SNF placement ) Need for family participation in patient care:  Yes (Comment)  Care giving concerns:  CSW received consult for discharge needs. CSW spoke with patient regarding PT recommendation of SNF placement at time of discharge.  Patient lives alone in a single level home. Patient states she has her mother and cousin that are supportive and lives near her home.    Social Worker assessment / plan:  CSW spoke with patient concerning possibility of rehab at [facility] before returning home. Patient in agreement with recommendations.   Employment status:  Disabled (Comment on whether or not currently receiving Disability) Insurance information:  Medicare PT Recommendations:  Skilled Nursing Facility Information / Referral to community resources:  Skilled Nursing Facility  Patient/Family's Response to care:  Patient recognizes need for rehab before returning home and is agreeable to a SNF placement. Patient reported preference for [facility] in [place].  CSW left the  Medicare[employer]gove listings in the patient's  room and requested the patient select 1-2 more possible  SNF options.    Patient/Family's Understanding of and Emotional Response to Diagnosis, Current Treatment, and Prognosis:  Patient is realistic regarding therapy needs and expressed being hopeful for SNF placement. Patient expressed understanding of CSW role and discharge process as well as medical condition. No questions/concerns about plan or treatment at this time.   Emotional Assessment Appearance:  Appears stated age Attitude/Demeanor/Rapport:  Engaged Affect (typically observed):  Accepting, Appropriate Orientation:  Oriented to Self, Oriented to Place, Oriented to  Time, Oriented to Situation Alcohol / Substance use:  Not Applicable Psych involvement (Current and /or in the community):  No (Comment)  Discharge Needs  Concerns to be addressed:  Care Coordination Readmission within the last 30 days:  No Current discharge risk:  Dependent with Mobility, Lives alone Barriers to Discharge:  Continued Medical Work up   [employer], LCSWA 06/03/2018, 2:05 PM

## 2018-06-03 NOTE — Progress Notes (Signed)
Occupational Therapy Treatment Patient Details Name: [name] MRN: [id number] DOB: July 08, 1945 Today's Date: 06/03/2018    History of present illness 73 yo female admitted 2/27 after having a fall with bilateral leg pain, weakness, and wheezing. CT revealed occlusion of R iliac limb bypass. Now s/p RIGHT FEMORAL THROMBECTOMY (Right ) PATCH ANGIOPLASTY RIGHT FEMORAL ARTERY WITH DECRON PATCH. Pmh of HLD, HTN, PVD, history of back surgeries.   OT comments  Pt performing ADL functional mobility with AFO on RLE and RW for stability with minA due to pt's inability to properly dorsiflex R ankle with decreased step height with mobility. Pt performing grooming at sink in sitting and fatigues easily, but SpO2 levels >92% on 2L [place]. Pt ambulating to bathroom for toilet hygiene and requires minA for stability in standing. Pt returned to chair in 10/10 pain - RN in room to distribute pain meds. Pt motivated to return to PLOF. Pt performing all functional tasks with increased assist and pt unable to properly care for self. Pt would benefit from continued OT skilled services for adl, mobility and safety in SNF setting. OT to follow acutely.    Follow Up Recommendations  Home health OT;Supervision/Assistance - 24 hour    Equipment Recommendations  3 in 1 bedside commode    Recommendations for Other Services      Precautions / Restrictions Precautions Precautions: Fall Restrictions Weight Bearing Restrictions: No       Mobility Bed Mobility Overal bed mobility: Needs Assistance             General bed mobility comments: up in recliner upon arrival  Transfers Overall transfer level: Needs assistance Equipment used: Rolling walker (2 wheeled);None Transfers: Sit to/from [employer] to Stand: Min assist;From elevated surface Stand pivot transfers: Min assist       General transfer comment: pt minA for safety and due to weakness with RLE unable to perform  dorsiflexion with R foot.    Balance Overall balance assessment: Needs assistance   Sitting balance-Leahy Scale: Good       Standing balance-Leahy Scale: Poor                 High Level Balance Comments: RW with cues for proper technique           ADL either performed or assessed with clinical judgement   ADL Overall ADL's : Needs assistance/impaired                     Lower Body Dressing: Minimal assistance;Cueing for safety;Sit to/from stand;Sitting/lateral leans   Toilet Transfer: Minimal assistance;Ambulation;Comfort height toilet;Grab bars;BSC;RW   Toileting- Clothing Manipulation and Hygiene: Minimal assistance;Cueing for safety;Cueing for sequencing;Sitting/lateral lean;Sit to/from stand       Functional mobility during ADLs: Minimal assistance;Rolling walker General ADL Comments: Pt performing LB dressing with maxA for AFO,but able to don/doff own socks. Pt performing grooming in sitting and increased pain. Pt limited by pain.     Vision   Vision Assessment?: No apparent visual deficits   Perception     Praxis      Cognition Arousal/Alertness: Awake/alert Behavior During Therapy: WFL for tasks assessed/performed Overall Cognitive Status: Within Functional Limits for tasks assessed Area of Impairment: Safety/judgement                               General Comments: A/O x4        Exercises  Shoulder Instructions       General Comments Pt's HR 68-90 BPM; BP: 155/98 post exertion    Pertinent Vitals/ Pain       Pain Assessment: 0-10 Pain Score: 10-Worst pain ever Pain Location: right LE Pain Descriptors / Indicators: Grimacing;Guarding;Sore;Numbness Pain Intervention(s): Limited activity within patient's tolerance  Home Living                                          Prior Functioning/Environment              Frequency  Min 2X/week        Progress Toward Goals  OT Goals(current  goals can now be found in the care plan section)  Progress towards OT goals: Progressing toward goals  Acute Rehab OT Goals Patient Stated Goal: return home OT Goal Formulation: With patient Time For Goal Achievement: 06/15/18 Potential to Achieve Goals: Good ADL Goals Pt Will Perform Grooming: with modified independence;standing Pt Will Perform Lower Body Dressing: with modified independence;sit to/from stand Pt Will Transfer to Toilet: with modified independence;ambulating;bedside commode Pt Will Perform Toileting - Clothing Manipulation and hygiene: with modified independence;sit to/from stand Pt Will Perform Tub/Shower Transfer: Tub transfer;with min guard assist;ambulating;3 in 1;rolling walker  Plan Discharge plan remains appropriate    Co-evaluation                 AM-PAC OT "6 Clicks" Daily Activity     Outcome Measure   Help from another person eating meals?: None Help from another person taking care of personal grooming?: A Little Help from another person toileting, which includes using toliet, bedpan, or urinal?: A Little Help from another person bathing (including washing, rinsing, drying)?: A Little Help from another person to put on and taking off regular upper body clothing?: None Help from another person to put on and taking off regular lower body clothing?: A Little 6 Click Score: 20    End of Session Equipment Utilized During Treatment: Rolling walker;Oxygen  OT Visit Diagnosis: Other abnormalities of gait and mobility (R26.89);Muscle weakness (generalized) (M62.81) Pain - Right/Left: Right Pain - part of body: Leg   Activity Tolerance Patient tolerated treatment well;Patient limited by pain   Patient Left in chair;with call bell/phone within reach;with chair alarm set   Nurse Communication Mobility status        Time: 1219-7588 OT Time Calculation (min): 26 min  Charges: OT General Charges $OT Visit: 1 Visit OT Treatments $Self Care/Home  Management : 23-37 mins  Revonda Standard Cecil Cranker) Glendell Docker OTR/L Acute Rehabilitation Services Pager: [phone] Office: [phone]    Sandrea Hughs 06/03/2018, 1:57 PM

## 2018-06-03 NOTE — Progress Notes (Signed)
[facility] PROGRESS NOTE  [name]:[id number] DOB: 06-11-1945 DOA: 05/27/2018 PCP: Junie Spencer, [name]   Narrative: 73 year old female with history of HLD, hypertension, continued tobacco use, peripheral vascular disease he s/p femoropopliteal bypass surgery in March 2002, nonobstructive coronary artery disease last heart cath on 05/17/2009 Was  admitted 2/27 after having a fall in the ED room while visiting her mom at [facility] had bilateral leg pain in the setting of her chronic back pain, wheezing, overall generalized weakness labs were normal and because of concern for dirty urine started on ceftriaxone, urine cultures came back to be negative.  Patient developed discoloration, coldness of the right lower extremity, concerning this patient is transferred to [facility]. CT angiogram chronic occlusion scar complete occlusion right iliac limb bypass with reconstitution of flow to right CFA. Found on the morning of 2/28 to have an acutely cold limb transferred emergently to [facility] for vascular eval.  Patient was found to have clot CFA, SFA profundus femoris.  Patient underwent thrombectomy, patch angioplasty on 05/31/2018.  Patient is currently on heparin drip.  A & Plan  ##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 and CFA down SFA profundus femoris, ABI right 0.1 left 0.9  Thromboembolectomy performed 3/1+  Patch angioplasty Further management as per vascular surgeon-- heparin--compartments soft--ABI done 3/2 again Continue Lidoderm patch  ##AKI 2/2 ATN -improved with IV fluids  ##Leukocytosis -Get chest x-ray, urine analysis -Considering patient's cough, unable to bring up the sputum treat as pneumonia  ##COPD exacerbation -Keep the patient on duo nebs, Solu-Medrol  ##Smoke -Counseling done. -Patient states nicotine patches helping her, plan to continue with the nicotine patch -Patient states made a decision to quit smoking  ##Chronic low back  pain status post surgery - Spinal cord stimulator 2 years previously -Continue Lyrica 200 3 times daily, Cymbalta 60 daily  ##CT evidence pancreatitis No reports of abdominal pain-monitor  ##Hyperlipidemia - Continue fenofibrate 160 as well as Lipitor 40  ##Hypertension-uncontrolled Hold lisinopril 40 daily amlodipine 10, labetalol as needed and hydralazine as needed cont metoprolol 12.5 twice daily ##Mild hyponatremia Resolved  ##Debility -Involve physical therapy.   Lovenox Full code Await further input from vascular surgery Inpatient for now dependent on progress   Samtani, MD  [facility] Via [employer] app OR [email or web address] 7PM-7AM contact night coverage as above 06/03/2018, 1:56 PM  LOS: 5 days     Interval history/Subjective:  Complaining of cough.  Denied any shortness of breath Objective:  Vitals:  Vitals:   06/03/18 0842 06/03/18 0846  BP: (!) 157/57   Pulse: 73 74  Resp:    Temp: 98.2 F (36.8 C)   SpO2: 93%     Exam:  Awake no distress EOMI NCAT Chest - bilateral occasional wheezing.  Mild prolonged expiratory phase on nasal oxygen Heart S1-S2 regular no murmurs are heard Left great toe has some mottling--some mottling on 2nd toe in addition Power 5/5 Abdomen soft, nontender, nondistended  I have personally reviewed the following:   Consultants:  Vascular surgery  Procedures: Procedure Performed 3/1 1.  Right lower extremity thromboembolectomy 2.  Right common femoral artery patch angioplasty with Dacron  Repeat ABI 3/2 Summary: Right: Resting right ankle-brachial index indicates moderate right lower extremity arterial disease.  Left: Resting left ankle-brachial index indicates moderate left lower extremity arterial disease.   Antimicrobials:  Perioperative biotics now off Keflex  DATA   Labs: Bun creat down to 25/0.9 from peak of 20/1.5  Imaging studies:  No  Review and summation of old  records:  yes  Scheduled Meds: . amLODipine  10 mg Oral Daily  . atorvastatin  40 mg Oral Daily  . budesonide (PULMICORT) nebulizer solution  0.25 mg Nebulization BID  . docusate sodium  100 mg Oral Daily  . DULoxetine  60 mg Oral Daily  . fenofibrate  160 mg Oral Daily  . lidocaine  2 patch Transdermal Q24H  . metoCLOPramide  5 mg Oral TID AC  . metoprolol tartrate  12.5 mg Oral BID  . nicotine  21 mg Transdermal Daily  . pantoprazole  40 mg Oral BID  . pregabalin  200 mg Oral TID   Continuous Infusions: . sodium chloride 75 mL/hr at 06/02/18 1230  . heparin 1,450 Units/hr (06/03/18 0950)  . magnesium sulfate 1 - 4 g bolus IVPB      Principal Problem:   General weakness Active Problems:   Hyperlipidemia with target LDL less than 100   TOBACCO ABUSE   Essential hypertension   Peripheral vascular disease (HCC)   GERD   Depression   Fall   Pancreatitis   LOS: 5 days

## 2018-06-03 NOTE — Progress Notes (Addendum)
  Progress Note    06/03/2018 7:15 AM 3 Days Post-Op  Subjective:  Sleeping-awakes easily.  No complaints.  Denies abdominal pain, dysuria.  Has a cough but not productive.    Afebrile HR 50's-70's NSR 120's-140's systolic 93% 3LO2NC  Vitals:   06/02/18 2300 06/03/18 0405  BP: (!) 138/58 (!) 140/59  Pulse: 74 64  Resp: 17 16  Temp:  98.3 F (36.8 C)  SpO2: 91% 92%    Physical Exam: Cardiac:  Regular Lungs:  Non labored; CTAB at bases Incisions:  Right groin is clean and dry Extremities:  Right foot is warm and well perfused with brisk PT/DP>peroneal Abdomen:  Soft, NT/ND  CBC    Component Value Date/Time   WBC 26.1 (H) 06/03/2018 0231   RBC 3.07 (L) 06/03/2018 0231   HGB 8.2 (L) 06/03/2018 0231   HGB 12.5 05/22/2018 1039   HCT 26.6 (L) 06/03/2018 0231   HCT 39.4 05/22/2018 1039   PLT 375 06/03/2018 0231   PLT 437 05/22/2018 1039   MCV 86.6 06/03/2018 0231   MCV 85 05/22/2018 1039   MCH 26.7 06/03/2018 0231   MCHC 30.8 06/03/2018 0231   RDW 15.9 (H) 06/03/2018 0231   RDW 14.5 05/22/2018 1039   LYMPHSABS 2.9 06/02/2018 0237   LYMPHSABS 1.7 05/22/2018 1039   MONOABS 1.6 (H) 06/02/2018 0237   EOSABS 0.0 06/02/2018 0237   EOSABS 0.3 05/22/2018 1039   BASOSABS 0.0 06/02/2018 0237   BASOSABS 0.1 05/22/2018 1039    BMET    Component Value Date/Time   NA 136 06/02/2018 0237   NA 142 05/22/2018 1039   K 3.8 06/02/2018 0237   CL 103 06/02/2018 0237   CO2 25 06/02/2018 0237   GLUCOSE 73 06/02/2018 0237   BUN 25 (H) 06/02/2018 0237   BUN 10 05/22/2018 1039   CREATININE 0.95 06/02/2018 0237   CREATININE 0.72 08/07/2012 1016   CALCIUM 8.3 (L) 06/02/2018 0237   GFRNONAA >60 06/02/2018 0237   GFRNONAA 88 08/07/2012 1016   GFRAA >60 06/02/2018 0237   GFRAA >89 08/07/2012 1016    INR    Component Value Date/Time   INR 1.05 05/17/2009 0700     Intake/Output Summary (Last 24 hours) at 06/03/2018 0715 Last data filed at 06/03/2018 0600 Gross per 24 hour    Intake 2663.87 ml  Output 925 ml  Net 1738.87 ml     Assessment:  73 y.o. female is s/p:  1.Right lower extremity thromboembolectomy 2.Right common femoral artery patch angioplasty with Dacron  3 Days Post-Op  Plan: -brisk PT/DP/peroneal -leukocytosis with WBC of 26.1k but afebrile - u/a on 2/26 wit many bacteria but no nitrites, will recheck.  If negative, will check cxr -need to transition to oral AC-will d/w Dr. Randie Heinz which [facility] he would like -PT recommending SNF-social work consult ordered -DVT prophylaxis:  Heparin gtt   Doreatha Massed, PA-C [facility] [phone] 06/03/2018 7:15 AM  I have independently interviewed and examined patient and agree with PA assessment and plan above.   Enis Leatherwood C. Randie Heinz, MD [facility] [place] Office: [phone] Pager: [phone]

## 2018-06-03 NOTE — NC FL2 (Signed)
[place] MEDICAID FL2 LEVEL OF CARE SCREENING TOOL     IDENTIFICATION  Patient Name: [name] Birthdate: 18-Feb-1946 Sex: female Admission Date (Current Location): 05/27/2018  [facility] and [place] Number:  Producer, television/film/video and Address:  [facility] [place], [address], [place], [place] [address]      Provider Number: [id number]  Attending Physician Name and Address:  Susa Griffins, MD  Relative Name and Phone Number:  [name]     Current Level of Care: Hospital Recommended Level of Care: Skilled Nursing Facility Prior Approval Number:    Date Approved/Denied:   PASRR Number: [id number]  Discharge Plan: SNF    Current Diagnoses: Patient Active Problem List   Diagnosis Date Noted  . General weakness 05/28/2018  . Fall 05/28/2018  . UTI (urinary tract infection) 05/28/2018  . Pancreatitis 05/28/2018  . Lumbar radiculopathy 07/30/2016  . Depression 07/24/2015  . S/P lumbar fusion 01/27/2015  . GAD (generalized anxiety disorder) 06/13/2014  . Neck pain 07/14/2012  . S/P lumbar spinal fusion 07/14/2012  . Lumbar stenosis with neurogenic claudication 06/11/2012  . DDD (degenerative disc disease), lumbosacral 04/24/2012  . Spondylolisthesis at L5-S1 level 04/24/2012  . Hyperlipidemia with target LDL less than 100 06/16/2009  . TOBACCO ABUSE 06/16/2009  . Essential hypertension 06/16/2009  . Peripheral vascular disease (HCC) 06/16/2009  . GERD 06/16/2009  . Chronic back pain 06/16/2009  . CAROTID BRUIT 05/23/2009    Orientation RESPIRATION BLADDER Height & Weight     Self, Time, Situation, Place  O2(nasal cannula 3(L/min) ) Continent Weight: 170 lb 3.1 oz (77.2 kg) Height:  5\' 7"  (170.2 cm)  BEHAVIORAL SYMPTOMS/MOOD NEUROLOGICAL BOWEL NUTRITION STATUS      Continent Diet(please see discharge summary )  AMBULATORY STATUS COMMUNICATION OF NEEDS Skin     Verbally Surgical wounds(incison(closed) Groin, Right)                      Personal Care Assistance Level of Assistance  Bathing, Dressing, Feeding Bathing Assistance: Limited assistance Feeding assistance: Independent Dressing Assistance: Limited assistance     Functional Limitations Info  Sight, Hearing, Speech Sight Info: Adequate Hearing Info: Adequate Speech Info: Adequate    SPECIAL CARE FACTORS FREQUENCY  PT (By licensed PT), OT (By licensed OT)     PT Frequency: 3x per month OT Frequency: 3x per month            Contractures Contractures Info: Not present    Additional Factors Info  Code Status, Allergies Code Status Info: Full Allergies Info: NKA           Current Medications (06/03/2018):  This is the current hospital active medication list Current Facility-Administered Medications  Medication Dose Route Frequency Provider Last Rate Last Dose  . 0.9 %  sodium chloride infusion   Intravenous Continuous Rhetta Mura, MD 75 mL/hr at 06/02/18 1230    . acetaminophen (TYLENOL) tablet 325-650 mg  325-650 mg Oral Q4H PRN Maeola Harman, MD   650 mg at 06/02/18 2303   Or  . acetaminophen (TYLENOL) suppository 325-650 mg  325-650 mg Rectal Q4H PRN Maeola Harman, MD      . alum & mag hydroxide-simeth (MAALOX/MYLANTA) 200-200-20 MG/5ML suspension 15-30 mL  15-30 mL Oral Q2H PRN Maeola Harman, MD      . amLODipine (NORVASC) tablet 10 mg  10 mg Oral Daily Maeola Harman, MD   10 mg at 06/03/18 0846  . atorvastatin (  LIPITOR) tablet 40 mg  40 mg Oral Daily Maeola Harman, MD   40 mg at 06/03/18 0848  . budesonide (PULMICORT) nebulizer solution 0.25 mg  0.25 mg Nebulization BID Maeola Harman, MD   0.25 mg at 06/03/18 3299  . docusate sodium (COLACE) capsule 100 mg  100 mg Oral Daily Maeola Harman, MD   100 mg at 06/03/18 0847  . DULoxetine (CYMBALTA) DR capsule 60 mg  60 mg Oral Daily Maeola Harman, MD   60 mg at 06/03/18 0846  .  fenofibrate tablet 160 mg  160 mg Oral Daily Maeola Harman, MD   160 mg at 06/03/18 0847  . guaiFENesin-dextromethorphan (ROBITUSSIN DM) 100-10 MG/5ML syrup 15 mL  15 mL Oral Q4H PRN Maeola Harman, MD      . heparin ADULT infusion 100 units/mL (25000 units/280mL sodium chloride 0.45%)  1,450 Units/hr Intravenous Continuous Baldemar Friday, RPH 14.5 mL/hr at 06/03/18 0950 1,450 Units/hr at 06/03/18 0950  . hydrALAZINE (APRESOLINE) injection 5 mg  5 mg Intravenous Q20 Min PRN Maeola Harman, MD      . HYDROmorphone (DILAUDID) injection 0.5-1 mg  0.5-1 mg Intravenous Q2H PRN Maeola Harman, MD   1 mg at 06/03/18 1212  . ipratropium-albuterol (DUONEB) 0.5-2.5 (3) MG/3ML nebulizer solution 3 mL  3 mL Nebulization Q4H PRN Maeola Harman, MD      . lidocaine (LIDODERM) 5 % 2 patch  2 patch Transdermal Q24H Maeola Harman, MD   2 patch at 06/02/18 1214  . magnesium sulfate IVPB 2 g 50 mL  2 g Intravenous Daily PRN Maeola Harman, MD      . metoCLOPramide (REGLAN) tablet 5 mg  5 mg Oral TID AC Rhetta Mura, MD   5 mg at 06/03/18 1211  . metoprolol tartrate (LOPRESSOR) injection 2-5 mg  2-5 mg Intravenous Q2H PRN Maeola Harman, MD      . metoprolol tartrate (LOPRESSOR) tablet 12.5 mg  12.5 mg Oral BID Rhetta Mura, MD   12.5 mg at 06/03/18 0846  . nicotine (NICODERM CQ - dosed in mg/24 hours) patch 21 mg  21 mg Transdermal Daily Maeola Harman, MD   21 mg at 06/03/18 0848  . ondansetron (ZOFRAN) injection 4 mg  4 mg Intravenous Q4H PRN Rhetta Mura, MD   4 mg at 06/01/18 0839  . ondansetron (ZOFRAN) tablet 4 mg  4 mg Oral Q6H PRN Maeola Harman, MD      . oxyCODONE (Oxy IR/ROXICODONE) immediate release tablet 5-10 mg  5-10 mg Oral Q4H PRN Maeola Harman, MD   10 mg at 06/03/18 0949  . pantoprazole (PROTONIX) EC tablet 40 mg  40 mg Oral BID Norva Pavlov,  RPH   40 mg at 06/03/18 0846  . phenol (CHLORASEPTIC) mouth spray 1 spray  1 spray Mouth/Throat PRN Maeola Harman, MD      . pregabalin (LYRICA) capsule 200 mg  200 mg Oral TID Maeola Harman, MD   200 mg at 06/03/18 0847  . senna-docusate (Senokot-S) tablet 2 tablet  2 tablet Oral QHS PRN Leda Gauze, NP   2 tablet at 06/02/18 2303     Discharge Medications: Please see discharge summary for a list of discharge medications.  Relevant Imaging Results:  Relevant Lab Results:   Additional Information SSN [id number]  Eduard Roux, LCSWA

## 2018-06-04 ENCOUNTER — Encounter: Payer: Medicare Other | Admitting: *Deleted

## 2018-06-04 DIAGNOSIS — J441 Chronic obstructive pulmonary disease with (acute) exacerbation: Secondary | ICD-10-CM

## 2018-06-04 DIAGNOSIS — F17200 Nicotine dependence, unspecified, uncomplicated: Secondary | ICD-10-CM

## 2018-06-04 DIAGNOSIS — I739 Peripheral vascular disease, unspecified: Secondary | ICD-10-CM

## 2018-06-04 LAB — HEPARIN LEVEL (UNFRACTIONATED): Heparin Unfractionated: 0.4 IU/mL (ref 0.30–0.70)

## 2018-06-04 LAB — BASIC METABOLIC PANEL
Anion gap: 13 (ref 5–15)
BUN: 15 mg/dL (ref 8–23)
CO2: 19 mmol/L — ABNORMAL LOW (ref 22–32)
CREATININE: 0.92 mg/dL (ref 0.44–1.00)
Calcium: 8.2 mg/dL — ABNORMAL LOW (ref 8.9–10.3)
Chloride: 102 mmol/L (ref 98–111)
GFR calc Af Amer: >60 mL/min (ref >60)
GFR calc non Af Amer: >60 mL/min (ref >60)
Glucose, Bld: 138 mg/dL — ABNORMAL HIGH (ref 70–99)
Potassium: 4.2 mmol/L (ref 3.5–5.1)
Sodium: 134 mmol/L — ABNORMAL LOW (ref 135–145)

## 2018-06-04 LAB — CBC WITH DIFFERENTIAL/PLATELET
ABS IMMATURE GRANULOCYTES: 0.6 10*3/uL — AB (ref 0.00–0.07)
BASOS ABS: 0.6 10*3/uL — AB (ref 0.0–0.1)
Basophils Relative: 2 %
EOS PCT: 0 %
Eosinophils Absolute: 0 10*3/uL (ref 0.0–0.5)
HCT: 28.4 % — ABNORMAL LOW (ref 36.0–46.0)
Hemoglobin: 8.4 g/dL — ABNORMAL LOW (ref 12.0–15.0)
Lymphocytes Relative: 0 %
Lymphs Abs: 0 10*3/uL — ABNORMAL LOW (ref 0.7–4.0)
MCH: 25.7 pg — ABNORMAL LOW (ref 26.0–34.0)
MCHC: 29.6 g/dL — ABNORMAL LOW (ref 30.0–36.0)
MCV: 86.9 fL (ref 80.0–100.0)
Monocytes Absolute: 0.3 10*3/uL (ref 0.1–1.0)
Monocytes Relative: 1 %
Myelocytes: 1 %
Neutro Abs: 30.2 10*3/uL — ABNORMAL HIGH (ref 1.7–7.7)
Neutrophils Relative %: 95 %
Platelets: 359 10*3/uL (ref 150–400)
Promyelocytes Relative: 1 %
RBC: 3.27 MIL/uL — ABNORMAL LOW (ref 3.87–5.11)
RDW: 15.9 % — ABNORMAL HIGH (ref 11.5–15.5)
WBC: 31.8 10*3/uL — ABNORMAL HIGH (ref 4.0–10.5)
nRBC: 1 % — ABNORMAL HIGH (ref 0.0–0.2)
nRBC: 2 /100 WBC — ABNORMAL HIGH

## 2018-06-04 NOTE — Care Management Important Message (Signed)
Important Message  Patient Details  Name: [name] MRN: [id number] Date of Birth: March 22, 1946   Medicare Important Message Given:  Yes    [name] [name] 06/04/2018, 1:25 PM

## 2018-06-04 NOTE — Progress Notes (Signed)
[facility] PROGRESS NOTE  [name] TDH:[id number] DOB: Aug 13, 1945 DOA: 05/27/2018 PCP: Junie Spencer, [name]   Narrative: 73 year old female with history of HLD, hypertension, continued tobacco use, peripheral vascular disease he s/p femoropopliteal bypass surgery in March 2002, nonobstructive coronary artery disease last heart cath on 05/17/2009 Was  admitted 2/27 after having a fall in the ED room while visiting her mom at [facility] had bilateral leg pain in the setting of her chronic back pain, wheezing, overall generalized weakness labs were normal and because of concern for dirty urine started on ceftriaxone, urine cultures came back to be negative.  Patient developed discoloration, coldness of the right lower extremity, concerning this patient is transferred to [facility]. CT angiogram chronic occlusion scar complete occlusion right iliac limb bypass with reconstitution of flow to right CFA. Found on the morning of 2/28 to have an acutely cold limb transferred emergently to [facility] for vascular eval.  Patient was found to have clot CFA, SFA profundus femoris.  Patient underwent thrombectomy, patch angioplasty on 05/31/2018.  Patient is currently on heparin drip.  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 -Considering patient's cough, unable to bring up the sputum treat as pneumonia -Patient does not look toxic -Patient has creased white blood cell count could be from steroids, closely follow-up -Also encouraged patient to sit in the chair as tolerated to  prevent atelectasis  ##COPD exacerbation -Keep the patient on duo nebs, Solu-Medrol -Good improvement from yesterday  ##Smoke -Counseling done. -Patient states nicotine patches helping her, plan to continue with the nicotine patch -Patient states made a decision to quit smoking  ##Chronic low back pain status post surgery - Spinal cord stimulator 2 years previously -Continue Lyrica 200 3 times daily, Cymbalta 60 daily  ##CT evidence pancreatitis No reports of abdominal pain-monitor  ##Hyperlipidemia - Continue fenofibrate 160 as well as Lipitor 40  ##Hypertension-uncontrolled Hold lisinopril 40 daily amlodipine 10, labetalol as needed and hydralazine as needed cont metoprolol 12.5 twice daily ##Mild hyponatremia Resolved  ##Debility -Involve physical therapy.  ##Anemia from acute blood loss -Hemoglobin stable   Lovenox Full code  Interval history/Subjective:  Improved cough from yesterday   objective:  Vitals:  Vitals:   06/04/18 1239 06/04/18 1344  BP: (!) 138/55   Pulse: 71   Resp: 18   Temp: 99.1 F (37.3 C)   SpO2: 94% 100%    Exam:  Awake no distress EOMI NCAT Chest -bilateral clear to auscultation  heart S1-S2 regular no murmurs are heard Left great toe has some mottling--some mottling on 2nd toe in addition Power 5/5 Abdomen soft, nontender, nondistended  I have personally reviewed the following:   Consultants:  Vascular surgery  Procedures: Procedure Performed 3/1 1.  Right lower extremity thromboembolectomy 2.  Right common femoral artery patch angioplasty with Dacron  Repeat ABI 3/2 Summary: Right: Resting right ankle-brachial index indicates moderate right lower extremity arterial disease.  Left: Resting left ankle-brachial index indicates moderate left lower extremity arterial disease.   Antimicrobials:  Perioperative biotics now off Keflex  DATA   Labs: Bun creat down to 25/0.9 from peak of 20/1.5  Imaging  studies:  No  Review and summation of old records:  yes  Scheduled Meds: . amLODipine  10 mg Oral Daily  . atorvastatin  40 mg Oral Daily  . budesonide (PULMICORT) nebulizer solution  0.25 mg Nebulization BID  . docusate sodium  100 mg Oral Daily  . DULoxetine  60 mg Oral Daily  . fenofibrate  160 mg Oral Daily  . ipratropium-albuterol  3 mL Nebulization TID  . lidocaine  2 patch Transdermal Q24H  . methylPREDNISolone (SOLU-MEDROL) injection  40 mg Intravenous Q6H  . metoCLOPramide  5 mg Oral TID AC  . metoprolol tartrate  12.5 mg Oral BID  . nicotine  21 mg Transdermal Daily  . pantoprazole  40 mg Oral BID  . pregabalin  200 mg Oral TID   Continuous Infusions: . sodium chloride 75 mL/hr at 06/03/18 1604  . cefTRIAXone (ROCEPHIN)  IV 1 g (06/03/18 1701)  . doxycycline (VIBRAMYCIN) IV 100 mg (06/04/18 0834)  . heparin 1,450 Units/hr (06/03/18 0950)  . magnesium sulfate 1 - 4 g bolus IVPB      Principal Problem:   General weakness Active Problems:   Hyperlipidemia with target LDL less than 100   TOBACCO ABUSE   Essential hypertension   Peripheral vascular disease (HCC)   GERD   Depression   Fall   Pancreatitis   LOS: 6 days

## 2018-06-04 NOTE — Progress Notes (Signed)
Physical Therapy Treatment Patient Details Name: [name] MRN: [id number] DOB: 11-16-1945 Today's Date: 06/04/2018    History of Present Illness 73 yo female admitted 2/27 after having a fall with bilateral leg pain, weakness, and wheezing. CT revealed occlusion of R iliac limb bypass. Now s/p RIGHT FEMORAL THROMBECTOMY (Right ) PATCH ANGIOPLASTY RIGHT FEMORAL ARTERY WITH DECRON PATCH. Pmh of HLD, HTN, PVD, history of back surgeries.    PT Comments    Patient making progress towards her physical therapy goals. Requiring min assist for all functional mobility. Ambulating from bed to chair using walker and right AFO. Pt continues with gross RLE weakness, increased edema, right foot drop, and pain. SNF remains appropriate based on deficits in order to maximize functional independence.     Follow Up Recommendations  SNF;Supervision/Assistance - 24 hour     Equipment Recommendations  Rolling walker with 5" wheels    Recommendations for Other Services       Precautions / Restrictions Precautions Precautions: Fall Required Braces or Orthoses: Other Brace Other Brace: R AFO Restrictions Weight Bearing Restrictions: No    Mobility  Bed Mobility Overal bed mobility: Needs Assistance Bed Mobility: Supine to Sit     Supine to sit: Min assist     General bed mobility comments: pt seeking handheld assist for trunk elevation  Transfers Overall transfer level: Needs assistance Equipment used: Rolling walker (2 wheeled);None Transfers: Sit to/from [employer] to Stand: Min guard Stand pivot transfers: Min guard       General transfer comment: pt preferring to pull up on walker. min guard for safety with transfer from chair <> BSC  Ambulation/Gait Ambulation/Gait assistance: Min guard Gait Distance (Feet): 3 Feet Assistive device: Rolling walker (2 wheeled) Gait Pattern/deviations: Decreased step length - right;Decreased step length - left;Decreased  stride length;Decreased dorsiflexion - right Gait velocity: decreased   General Gait Details: pt continues with ataxic movement of RLE and decreased proprioception, requiring cueing for positioning   Stairs             Wheelchair Mobility    Modified Rankin (Stroke Patients Only)       Balance Overall balance assessment: Needs assistance   Sitting balance-Leahy Scale: Good       Standing balance-Leahy Scale: Poor                              Cognition Arousal/Alertness: Awake/alert Behavior During Therapy: Flat affect Overall Cognitive Status: Within Functional Limits for tasks assessed                                        Exercises General Exercises - Lower Extremity Quad Sets: Right;Supine;10 reps Heel Slides: 5 reps;Right;Supine;Other (comment)(limited)    General Comments        Pertinent Vitals/Pain Pain Assessment: Faces Faces Pain Scale: Hurts even more Pain Location: right LE Pain Descriptors / Indicators: Grimacing;Guarding;Sore;Numbness Pain Intervention(s): Monitored during session    Home Living                      Prior Function            PT Goals (current goals can now be found in the care plan section) Acute Rehab PT Goals Patient Stated Goal: return home Potential to Achieve Goals: Good Progress towards PT goals: Progressing  toward goals    Frequency    Min 3X/week      PT Plan Current plan remains appropriate    Co-evaluation              AM-PAC PT "6 Clicks" Mobility   Outcome Measure  Help needed turning from your back to your side while in a flat bed without using bedrails?: None Help needed moving from lying on your back to sitting on the side of a flat bed without using bedrails?: A Little Help needed moving to and from a bed to a chair (including a wheelchair)?: A Little Help needed standing up from a chair using your arms (e.g., wheelchair or bedside chair)?: A  Little Help needed to walk in hospital room?: A Little Help needed climbing 3-5 steps with a railing? : Total 6 Click Score: 17    End of Session Equipment Utilized During Treatment: Gait belt;Other (comment)(right AFO) Activity Tolerance: Patient tolerated treatment well Patient left: in chair;with call bell/phone within reach Nurse Communication: Mobility status PT Visit Diagnosis: Unsteadiness on feet (R26.81);Other abnormalities of gait and mobility (R26.89);Muscle weakness (generalized) (M62.81)     Time: 7998-7215 PT Time Calculation (min) (ACUTE ONLY): 21 min  Charges:  $Therapeutic Activity: 8-22 mins                     Laurina Bustle, PT, DPT Acute Rehabilitation Services Pager [phone] Office [phone]    Vanetta Mulders 06/04/2018, 5:07 PM

## 2018-06-04 NOTE — Progress Notes (Signed)
ANTICOAGULATION CONSULT NOTE - Follow Up Consult  Pharmacy Consult for Heparin Indication: acute limb ischemia   Patient Measurements: Height: 5\' 7"  (170.2 cm) Weight: 170 lb 3.1 oz (77.2 kg) IBW/kg (Calculated) : 61.6 Heparin Dosing Weight:  72 kg  Vital Signs: Temp: 97.6 F (36.4 C) (03/05 0408) Temp Source: Oral (03/05 0408) BP: 157/56 (03/05 0408) Pulse Rate: 63 (03/05 0408)  Labs: Recent Labs    06/02/18 0237 06/02/18 1650 06/03/18 0231 06/04/18 0410  HGB 8.9*  --  8.2* 8.4*  HCT 28.9*  --  26.6* 28.4*  PLT 379  --  375 359  HEPARINUNFRC 0.21* 0.21* 0.34 0.40  CREATININE 0.95  --   --  0.92     Assessment: RLE bypass occlusion s/p thromboembolectomy, 4 days post-op.  Heparin level therapeutic at 0.40, CBC stable.    Goal of Therapy:  Heparin level 0.3-0.7 units/ml Monitor platelets by anticoagulation protocol: Yes   Plan:  Continue heparin gtt at 1450 units/hr Daily heparin level, CBC, s/s bleeding  Daylene Posey, PharmD Clinical Pharmacist Please check AMION for all [facility] Pharmacy numbers 06/04/2018 8:34 AM

## 2018-06-04 NOTE — Plan of Care (Signed)

## 2018-06-04 NOTE — Progress Notes (Addendum)
  Progress Note    06/04/2018 7:30 AM 4 Days Post-Op  Subjective:  Says her ankle hurts but this is no different than it has been.  Afebrile HR 50's-70's NSR 140's-150's systolic 97% 3LO2NC  Vitals:   06/04/18 0025 06/04/18 0408  BP:  (!) 157/56  Pulse: 62 63  Resp: 15 17  Temp:  97.6 F (36.4 C)  SpO2: 93% 94%    Physical Exam: Cardiac:  regular Lungs:  Non labored Incisions:  Clean and dry  Extremities:  Brisk PT/DP   CBC    Component Value Date/Time   WBC 31.8 (H) 06/04/2018 0410   RBC 3.27 (L) 06/04/2018 0410   HGB 8.4 (L) 06/04/2018 0410   HGB 12.5 05/22/2018 1039   HCT 28.4 (L) 06/04/2018 0410   HCT 39.4 05/22/2018 1039   PLT 359 06/04/2018 0410   PLT 437 05/22/2018 1039   MCV 86.9 06/04/2018 0410   MCV 85 05/22/2018 1039   MCH 25.7 (L) 06/04/2018 0410   MCHC 29.6 (L) 06/04/2018 0410   RDW 15.9 (H) 06/04/2018 0410   RDW 14.5 05/22/2018 1039   LYMPHSABS 0.0 (L) 06/04/2018 0410   LYMPHSABS 1.7 05/22/2018 1039   MONOABS 0.3 06/04/2018 0410   EOSABS 0.0 06/04/2018 0410   EOSABS 0.3 05/22/2018 1039   BASOSABS 0.6 (H) 06/04/2018 0410   BASOSABS 0.1 05/22/2018 1039    BMET    Component Value Date/Time   NA 134 (L) 06/04/2018 0410   NA 142 05/22/2018 1039   K 4.2 06/04/2018 0410   CL 102 06/04/2018 0410   CO2 19 (L) 06/04/2018 0410   GLUCOSE 138 (H) 06/04/2018 0410   BUN 15 06/04/2018 0410   BUN 10 05/22/2018 1039   CREATININE 0.92 06/04/2018 0410   CREATININE 0.72 08/07/2012 1016   CALCIUM 8.2 (L) 06/04/2018 0410   GFRNONAA >60 06/04/2018 0410   GFRNONAA 88 08/07/2012 1016   GFRAA >60 06/04/2018 0410   GFRAA >89 08/07/2012 1016    INR    Component Value Date/Time   INR 1.05 05/17/2009 0700     Intake/Output Summary (Last 24 hours) at 06/04/2018 0730 Last data filed at 06/04/2018 0702 Gross per 24 hour  Intake 2081 ml  Output 1450 ml  Net 631 ml     Assessment:  73 y.o. female is s/p:  1.Right lower extremity  thromboembolectomy 2.Right common femoral artery patch angioplasty with Dacron   4 Days Post-Op  Plan: -brisk PT/DP doppler signals right foot -continue for worsening leukocytosis at 32k (up from 26).  U/a with many bacteria and budding yeast.  Pt on doxy and rocephin .Marland Kitchen may need to add diflucan but will defer to primary service.  Pt also receiving solu-medrol -DVT prophylaxis:  Heparin gtt -continue mobilization - wearing brace when walking only   Doreatha Massed, PA-C [facility] [phone] 06/04/2018 7:30 AM   I have independently interviewed and examined patient and agree with PA assessment and plan above. Palpable right pt and left dp. Left toes are demarcating but causing no pain and are sensorimotor in tact. Right foot regaining some motor and has temperature sensation. Groin wound cdi without evidence of infection.   Mace Weinberg C. Randie Heinz, MD [facility] [place] Office: [phone] Pager: [phone]

## 2018-06-04 NOTE — Progress Notes (Signed)
Informed the patient of bed offers. Patient wanted CSW to follow up with [facility].  CSW called [facility] , they will review and respond in the hub. CSW will updated patient once decision has been made.  Antony Blackbird, MSW, [facility] Clinical Social Worker [phone]

## 2018-06-05 DIAGNOSIS — M5441 Lumbago with sciatica, right side: Secondary | ICD-10-CM

## 2018-06-05 DIAGNOSIS — G8929 Other chronic pain: Secondary | ICD-10-CM

## 2018-06-05 DIAGNOSIS — S7001XA Contusion of right hip, initial encounter: Secondary | ICD-10-CM

## 2018-06-05 DIAGNOSIS — I10 Essential (primary) hypertension: Secondary | ICD-10-CM

## 2018-06-05 DIAGNOSIS — M5442 Lumbago with sciatica, left side: Secondary | ICD-10-CM

## 2018-06-05 DIAGNOSIS — J441 Chronic obstructive pulmonary disease with (acute) exacerbation: Secondary | ICD-10-CM

## 2018-06-05 LAB — BASIC METABOLIC PANEL
Anion gap: 7 (ref 5–15)
BUN: 15 mg/dL (ref 8–23)
CO2: 22 mmol/L (ref 22–32)
Calcium: 8.4 mg/dL — ABNORMAL LOW (ref 8.9–10.3)
Chloride: 108 mmol/L (ref 98–111)
Creatinine, Ser: 0.66 mg/dL (ref 0.44–1.00)
GFR calc Af Amer: >60 mL/min (ref >60)
GFR calc non Af Amer: >60 mL/min (ref >60)
Glucose, Bld: 154 mg/dL — ABNORMAL HIGH (ref 70–99)
Potassium: 3.5 mmol/L (ref 3.5–5.1)
SODIUM: 137 mmol/L (ref 135–145)

## 2018-06-05 LAB — CBC
HCT: 25.9 % — ABNORMAL LOW (ref 36.0–46.0)
Hemoglobin: 7.9 g/dL — ABNORMAL LOW (ref 12.0–15.0)
MCH: 26 pg (ref 26.0–34.0)
MCHC: 30.5 g/dL (ref 30.0–36.0)
MCV: 85.2 fL (ref 80.0–100.0)
PLATELETS: 369 10*3/uL (ref 150–400)
RBC: 3.04 MIL/uL — ABNORMAL LOW (ref 3.87–5.11)
RDW: 16.3 % — ABNORMAL HIGH (ref 11.5–15.5)
WBC: 35.6 10*3/uL — ABNORMAL HIGH (ref 4.0–10.5)
nRBC: 1.2 % — ABNORMAL HIGH (ref 0.0–0.2)

## 2018-06-05 LAB — HEPARIN LEVEL (UNFRACTIONATED): HEPARIN UNFRACTIONATED: 0.66 [IU]/mL (ref 0.30–0.70)

## 2018-06-05 NOTE — Progress Notes (Signed)
Occupational Therapy Treatment Patient Details Name: [name] MRN: [id number] DOB: December 04, 1945 Today's Date: 06/05/2018    History of present illness 73 yo female admitted 2/27 after having a fall with bilateral leg pain, weakness, and wheezing. CT revealed occlusion of R iliac limb bypass. Now s/p RIGHT FEMORAL THROMBECTOMY (Right ) PATCH ANGIOPLASTY RIGHT FEMORAL ARTERY WITH DECRON PATCH. Pmh of HLD, HTN, PVD, history of back surgeries.   OT comments  Pt making progress with functional goals. OT will continue to follow acutely  Follow Up Recommendations  Home health OT;Supervision/Assistance - 24 hour    Equipment Recommendations  3 in 1 bedside commode    Recommendations for Other Services      Precautions / Restrictions Precautions Precautions: Fall Required Braces or Orthoses: Other Brace Other Brace: R AFO Restrictions Weight Bearing Restrictions: No       Mobility Bed Mobility Overal bed mobility: Needs Assistance Bed Mobility: Supine to Sit     Supine to sit: Min guard        Transfers Overall transfer level: Needs assistance Equipment used: Rolling walker (2 wheeled);None Transfers: Sit to/from [employer] to Stand: Min guard Stand pivot transfers: Min guard       General transfer comment: cues for safety, correct hand placement    Balance Overall balance assessment: Needs assistance Sitting-balance support: Feet supported;No upper extremity supported Sitting balance-Leahy Scale: Good Sitting balance - Comments: able to don L shoe   Standing balance support: During functional activity;Bilateral upper extremity supported Standing balance-Leahy Scale: Poor                             ADL either performed or assessed with clinical judgement   ADL Overall ADL's : Needs assistance/impaired     Grooming: Sitting;Wash/dry hands;Min guard;Wash/dry face           Upper Body Dressing : Min guard;Standing    Lower Body Dressing: Minimal assistance;Cueing for safety Lower Body Dressing Details (indicate cue type and reason): min A to don R shoe with AFO Toilet Transfer: Ambulation;Comfort height toilet;Grab bars;BSC;RW;Min guard;Cueing for safety   Toileting- Clothing Manipulation and Hygiene: Cueing for safety;Sitting/lateral lean;Sit to/from stand;Min guard       Functional mobility during ADLs: Min guard;Rolling walker;Cueing for safety       Vision Patient Visual Report: No change from baseline     Perception     Praxis      Cognition Arousal/Alertness: Awake/alert Behavior During Therapy: Flat affect Overall Cognitive Status: Within Functional Limits for tasks assessed Area of Impairment: Safety/judgement                                        Exercises     Shoulder Instructions       General Comments      Pertinent Vitals/ Pain       Pain Assessment: 0-10 Pain Score: 3  Pain Location: right LE Pain Descriptors / Indicators: Sore Pain Intervention(s): Monitored during session;Repositioned  Home Living                                          Prior Functioning/Environment              Frequency  Min  2X/week        Progress Toward Goals  OT Goals(current goals can now be found in the care plan section)  Progress towards OT goals: Progressing toward goals  Acute Rehab OT Goals Patient Stated Goal: return home  Plan Discharge plan remains appropriate    Co-evaluation                 AM-PAC OT "6 Clicks" Daily Activity     Outcome Measure   Help from another person eating meals?: None Help from another person taking care of personal grooming?: A Little Help from another person toileting, which includes using toliet, bedpan, or urinal?: A Little Help from another person bathing (including washing, rinsing, drying)?: A Little Help from another person to put on and taking off regular upper body clothing?:  A Little Help from another person to put on and taking off regular lower body clothing?: A Little 6 Click Score: 19    End of Session Equipment Utilized During Treatment: Rolling walker;Oxygen;Gait belt  OT Visit Diagnosis: Other abnormalities of gait and mobility (R26.89);Muscle weakness (generalized) (M62.81) Pain - Right/Left: Right Pain - part of body: Leg   Activity Tolerance Patient tolerated treatment well   Patient Left in chair;with call bell/phone within reach;with chair alarm set   Nurse Communication          Time: 8241-7530 OT Time Calculation (min): 26 min  Charges: OT General Charges $OT Visit: 1 Visit OT Treatments $Self Care/Home Management : 8-22 mins $Therapeutic Activity: 8-22 mins     Galen Manila 06/05/2018, 12:44 PM

## 2018-06-05 NOTE — Progress Notes (Addendum)
  Progress Note    06/05/2018 7:30 AM 5 Days Post-Op  Subjective:  Sleeping-awakes easily to voice  Afebrile HR 60's-70's NSR 130's-140's systolic  94% 3LO2NC  Vitals:   06/04/18 2219 06/05/18 0510  BP: (!) 140/56 (!) 134/50  Pulse: 73 66  Resp:  19  Temp:  97.6 F (36.4 C)  SpO2:  97%    Physical Exam: Cardiac:  regular Lungs:  Non labored Extremities:  Palpable right pt and left dp; right foot is warm   CBC    Component Value Date/Time   WBC 35.6 (H) 06/05/2018 0222   RBC 3.04 (L) 06/05/2018 0222   HGB 7.9 (L) 06/05/2018 0222   HGB 12.5 05/22/2018 1039   HCT 25.9 (L) 06/05/2018 0222   HCT 39.4 05/22/2018 1039   PLT 369 06/05/2018 0222   PLT 437 05/22/2018 1039   MCV 85.2 06/05/2018 0222   MCV 85 05/22/2018 1039   MCH 26.0 06/05/2018 0222   MCHC 30.5 06/05/2018 0222   RDW 16.3 (H) 06/05/2018 0222   RDW 14.5 05/22/2018 1039   LYMPHSABS 0.0 (L) 06/04/2018 0410   LYMPHSABS 1.7 05/22/2018 1039   MONOABS 0.3 06/04/2018 0410   EOSABS 0.0 06/04/2018 0410   EOSABS 0.3 05/22/2018 1039   BASOSABS 0.6 (H) 06/04/2018 0410   BASOSABS 0.1 05/22/2018 1039    BMET    Component Value Date/Time   NA 137 06/05/2018 0222   NA 142 05/22/2018 1039   K 3.5 06/05/2018 0222   CL 108 06/05/2018 0222   CO2 22 06/05/2018 0222   GLUCOSE 154 (H) 06/05/2018 0222   BUN 15 06/05/2018 0222   BUN 10 05/22/2018 1039   CREATININE 0.66 06/05/2018 0222   CREATININE 0.72 08/07/2012 1016   CALCIUM 8.4 (L) 06/05/2018 0222   GFRNONAA >60 06/05/2018 0222   GFRNONAA 88 08/07/2012 1016   GFRAA >60 06/05/2018 0222   GFRAA >89 08/07/2012 1016    INR    Component Value Date/Time   INR 1.05 05/17/2009 0700     Intake/Output Summary (Last 24 hours) at 06/05/2018 0730 Last data filed at 06/05/2018 0216 Gross per 24 hour  Intake 3495.41 ml  Output 1050 ml  Net 2445.41 ml     Assessment:  73 y.o. female is s/p:  1.Right lower extremity thromboembolectomy 2.Right common femoral  artery patch angioplasty with Dacron  5 Days Post-Op  Plan: -pt with palpable right PT pulse and foot is warm.  -leukocytosis up again today at 35.6k and afebrile.  Pt receiving solumedrol.  tx per primary team. -DVT prophylaxis:  Heparin gtt -continue mobilization - continue wearing brace   Doreatha Massed, PA-C [facility] [phone] 06/05/2018 7:30 AM   I have independently interviewed and examined patient and agree with PA assessment and plan above.  Continue to monitor neurologic improvement right lower extremity.  She needs foot drop boot to prevent this.  Does not appear to be a surgical cause of her leukocytosis.  Will need long-term anticoagulation.   has Shaune Westfall C. Randie Heinz, MD [facility] [place] Office: [phone] Pager: [phone]

## 2018-06-05 NOTE — Progress Notes (Signed)
ANTICOAGULATION CONSULT NOTE - Follow Up Consult  Pharmacy Consult for Heparin Indication: acute limb ischemia   Patient Measurements: Height: 5\' 7"  (170.2 cm) Weight: 170 lb 3.1 oz (77.2 kg) IBW/kg (Calculated) : 61.6 Heparin Dosing Weight:  72 kg  Vital Signs: Temp: 97.9 F (36.6 C) (03/06 0832) Temp Source: Oral (03/06 0832) BP: 150/69 (03/06 0832) Pulse Rate: 77 (03/06 0851)  Labs: Recent Labs    06/03/18 0231 06/04/18 0410 06/05/18 0222  HGB 8.2* 8.4* 7.9*  HCT 26.6* 28.4* 25.9*  PLT 375 359 369  HEPARINUNFRC 0.34 0.40 0.66  CREATININE  --  0.92 0.66     Assessment: RLE bypass occlusion s/p thromboembolectomy, 5 days post-op.    Heparin level therapeutic at 0.66 but trending up, will adjust rate down this morning. Hgb trending down from, no overt bleeding noted.     Goal of Therapy:  Heparin level 0.3-0.7 units/ml Monitor platelets by anticoagulation protocol: Yes   Plan:  Reduce IV heparin gtt to 1400 units/hr Daily heparin level, CBC, s/s bleeding  Sheppard Coil PharmD., BCPS Clinical Pharmacist 06/05/2018 11:16 AM

## 2018-06-05 NOTE — Progress Notes (Signed)
[facility] HOSPITALIST PROGRESS NOTE  GENAVIE LOUCK UOH:[id number] DOB: [birth year] DOA: 05/27/2018 PCP: Junie Spencer, [name]   Narrative: 73 year old female with history of HLD, hypertension, continued tobacco use, peripheral vascular disease he s/p femoropopliteal bypass surgery in March 2002, nonobstructive coronary artery disease last heart cath on 05/17/2009 Was  admitted 2/27 after having a fall in the ED room while visiting her mom at [facility] had bilateral leg pain in the setting of her chronic back pain, wheezing, overall generalized weakness labs were normal and because of concern for dirty urine started on ceftriaxone, urine cultures came back to be negative.  Patient developed discoloration, coldness of the right lower extremity, concerning this patient is transferred to [facility]. CT angiogram chronic occlusion scar complete occlusion right iliac limb bypass with reconstitution of flow to right CFA. Found on the morning of 2/28 to have an acutely cold limb transferred emergently to [facility] for vascular eval.  Patient was found to have clot CFA, SFA profundus femoris.  Patient underwent thrombectomy, patch angioplasty on 05/31/2018.  Patient is currently on heparin drip.  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 -Considering patient's cough, unable to bring up the sputum treat as pneumonia -Patient does not look toxic -Patient has creased white blood cell count could be from steroids, closely follow-up -Also encouraged patient to sit in the chair as tolerated to  prevent atelectasis -Discontinued Solu-Medrol  ##COPD exacerbation -Keep the patient on duo nebs, Solu-Medrol -Improved  ##Smoke -Counseling done. -Patient states nicotine patches helping her, plan to continue with the nicotine patch -Patient states made a decision to quit smoking  ##Chronic low back pain status post surgery - Spinal cord stimulator 2 years previously -Continue Lyrica 200 3 times daily, Cymbalta 60 daily  ##CT evidence pancreatitis -No reports of abdominal pain-monitor  ##Hyperlipidemia - Continue fenofibrate 160 as well as Lipitor 40  ##Hypertension-uncontrolled Hold lisinopril 40 daily amlodipine 10, labetalol as needed and hydralazine as needed cont metoprolol 12.5 twice daily ##Mild hyponatremia Resolved  ##Debility -Involve physical therapy.  ##Anemia from acute blood loss -Hemoglobin stable   Lovenox Full code  Interval history/Subjective:  Improved cough from yesterday   objective:  Vitals:  Vitals:   06/05/18 0851 06/05/18 1439  BP:  105/63  Pulse: 77 74  Resp:  14  Temp:  97.9 F (36.6 C)  SpO2:  95%    Exam:  Awake no distress EOMI NCAT Chest -bilateral clear to auscultation  heart S1-S2 regular no murmurs are heard Left great toe has some mottling--some mottling on 2nd toe in addition Power 5/5 Abdomen soft, nontender, nondistended  I have personally reviewed the following:   Consultants:  Vascular surgery  Procedures: Procedure Performed 3/1 1.  Right lower extremity thromboembolectomy 2.  Right common femoral artery patch angioplasty with Dacron  Repeat ABI 3/2 Summary: Right: Resting right ankle-brachial index indicates moderate right lower extremity arterial disease.  Left: Resting left ankle-brachial index indicates moderate left lower extremity arterial disease.   Antimicrobials:  Perioperative biotics now off Keflex  DATA   Labs: Bun creat down to 25/0.9 from peak of 20/1.5  Imaging  studies:  No  Review and summation of old records:  yes  Scheduled Meds: . amLODipine  10 mg Oral Daily  . atorvastatin  40 mg Oral Daily  . budesonide (PULMICORT) nebulizer solution  0.25 mg Nebulization BID  . docusate sodium  100 mg Oral Daily  . DULoxetine  60 mg Oral Daily  . fenofibrate  160 mg Oral Daily  . ipratropium-albuterol  3 mL Nebulization TID  . lidocaine  2 patch Transdermal Q24H  . metoprolol tartrate  12.5 mg Oral BID  . nicotine  21 mg Transdermal Daily  . pantoprazole  40 mg Oral BID  . pregabalin  200 mg Oral TID   Continuous Infusions: . sodium chloride 75 mL/hr at 06/05/18 0840  . cefTRIAXone (ROCEPHIN)  IV 1 g (06/05/18 1558)  . doxycycline (VIBRAMYCIN) IV 100 mg (06/05/18 0850)  . heparin 1,400 Units/hr (06/05/18 1126)  . magnesium sulfate 1 - 4 g bolus IVPB      Principal Problem:   General weakness Active Problems:   Hyperlipidemia with target LDL less than 100   TOBACCO ABUSE   Essential hypertension   Peripheral vascular disease (HCC)   GERD   Depression   Fall   Pancreatitis   LOS: 7 days

## 2018-06-06 DIAGNOSIS — E785 Hyperlipidemia, unspecified: Secondary | ICD-10-CM

## 2018-06-06 DIAGNOSIS — F320 Major depressive disorder, single episode, mild: Secondary | ICD-10-CM

## 2018-06-06 LAB — CBC
HCT: 25.3 % — ABNORMAL LOW (ref 36.0–46.0)
Hemoglobin: 7.8 g/dL — ABNORMAL LOW (ref 12.0–15.0)
MCH: 26.3 pg (ref 26.0–34.0)
MCHC: 30.8 g/dL (ref 30.0–36.0)
MCV: 85.2 fL (ref 80.0–100.0)
Platelets: 344 10*3/uL (ref 150–400)
RBC: 2.97 MIL/uL — ABNORMAL LOW (ref 3.87–5.11)
RDW: 16.8 % — ABNORMAL HIGH (ref 11.5–15.5)
WBC: 27.3 10*3/uL — AB (ref 4.0–10.5)
nRBC: 1.2 % — ABNORMAL HIGH (ref 0.0–0.2)

## 2018-06-06 LAB — BASIC METABOLIC PANEL
Anion gap: 11 (ref 5–15)
BUN: 11 mg/dL (ref 8–23)
CHLORIDE: 107 mmol/L (ref 98–111)
CO2: 22 mmol/L (ref 22–32)
Calcium: 8.1 mg/dL — ABNORMAL LOW (ref 8.9–10.3)
Creatinine, Ser: 0.67 mg/dL (ref 0.44–1.00)
GFR calc Af Amer: >60 mL/min (ref >60)
GFR calc non Af Amer: >60 mL/min (ref >60)
Glucose, Bld: 94 mg/dL (ref 70–99)
Potassium: 3.1 mmol/L — ABNORMAL LOW (ref 3.5–5.1)
Sodium: 140 mmol/L (ref 135–145)

## 2018-06-06 LAB — HEPARIN LEVEL (UNFRACTIONATED): Heparin Unfractionated: 0.47 IU/mL (ref 0.30–0.70)

## 2018-06-06 NOTE — Progress Notes (Signed)
[facility] HOSPITALIST PROGRESS NOTE  [name] DGU:[id number] DOB: [birth year] DOA: 05/27/2018 PCP: Junie Spencer, [name]   Narrative: 73 year old female with history of HLD, hypertension, continued tobacco use, peripheral vascular disease he s/p femoropopliteal bypass surgery in March 2002, nonobstructive coronary artery disease last heart cath on 05/17/2009 Was  admitted 2/27 after having a fall in the ED room while visiting her mom at [facility] had bilateral leg pain in the setting of her chronic back pain, wheezing, overall generalized weakness labs were normal and because of concern for dirty urine started on ceftriaxone, urine cultures came back to be negative.  Patient developed discoloration, coldness of the right lower extremity, concerning this patient is transferred to [facility]. CT angiogram chronic occlusion scar complete occlusion right iliac limb bypass with reconstitution of flow to right CFA. Found on the morning of 2/28 to have an acutely cold limb transferred emergently to [facility] for vascular eval.  Patient was found to have clot CFA, SFA profundus femoris.  Patient underwent thrombectomy, patch angioplasty on 05/31/2018.  Patient is currently on heparin drip.  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 -Considering patient's cough, unable to bring up the sputum treat as pneumonia -Patient has increased white blood cell count could be from steroids, closely follow-up -Also encouraged patient to sit  in the chair as tolerated to prevent atelectasis -Discontinued Solu-Medrol -WBC trending down, continues to be afebrile  ##COPD exacerbation -Keep the patient on duo nebs, Solu-Medrol -Improved  ##Smoker -Counseling done. -Patient states nicotine patches helping her, plan to continue with the nicotine patch -Patient states made a decision to quit smoking  ##Chronic low back pain status post surgery - Spinal cord stimulator 2 years previously -Continue Lyrica 200 3 times daily, Cymbalta 60 daily  ##CT evidence pancreatitis -No reports of abdominal pain-monitor  ##Hyperlipidemia - Continue fenofibrate 160 as well as Lipitor 40  ##Hypertension-uncontrolled Hold lisinopril 40 daily amlodipine 10, labetalol as needed and hydralazine as needed cont metoprolol 12.5 twice daily ##Mild hyponatremia Resolved  ##Debility -Involve physical therapy.  ##Anemia from acute blood loss -Hemoglobin stable   Lovenox Full code  Interval history/Subjective:  Denies having any complaints. objective:  Vitals:  Vitals:   06/06/18 0914 06/06/18 1255  BP:  (!) 154/67  Pulse: 76 82  Resp: 19 15  Temp:  98 F (36.7 C)  SpO2: 95% 96%    Exam:  Awake no distress EOMI NCAT Chest -bilateral clear to auscultation  heart S1-S2 regular no murmurs are heard Left great toe has some mottling--some mottling on 2nd toe in addition Power 5/5 Abdomen soft, nontender, nondistended  I have personally reviewed the following:   Consultants:  Vascular surgery  Procedures: Procedure Performed 3/1 1.  Right lower extremity thromboembolectomy 2.  Right common femoral artery patch angioplasty with Dacron  Repeat ABI 3/2 Summary: Right: Resting right ankle-brachial index indicates moderate right lower extremity arterial disease.  Left: Resting left ankle-brachial index indicates moderate left lower extremity arterial disease.   Antimicrobials:  Perioperative biotics now off  Keflex  DATA   Labs:  Bun creat down to 25/0.9 from peak of 20/1.5  Imaging studies:  No  Review and summation of old records:  yes  Scheduled Meds: . amLODipine  10 mg Oral Daily  . atorvastatin  40 mg Oral Daily  . budesonide (PULMICORT) nebulizer solution  0.25 mg Nebulization BID  . docusate sodium  100 mg Oral Daily  . DULoxetine  60 mg Oral Daily  . fenofibrate  160 mg Oral Daily  . ipratropium-albuterol  3 mL Nebulization TID  . lidocaine  2 patch Transdermal Q24H  . metoprolol tartrate  12.5 mg Oral BID  . nicotine  21 mg Transdermal Daily  . pantoprazole  40 mg Oral BID  . pregabalin  200 mg Oral TID   Continuous Infusions: . sodium chloride 75 mL/hr at 06/05/18 2233  . cefTRIAXone (ROCEPHIN)  IV 1 g (06/06/18 1335)  . doxycycline (VIBRAMYCIN) IV 100 mg (06/06/18 0857)  . heparin 1,400 Units/hr (06/06/18 0718)  . magnesium sulfate 1 - 4 g bolus IVPB      Principal Problem:   General weakness Active Problems:   Hyperlipidemia with target LDL less than 100   TOBACCO ABUSE   Essential hypertension   Peripheral vascular disease (HCC)   GERD   Depression   Fall   Pancreatitis   Contusion of right hip   COPD with acute exacerbation (HCC)   LOS: 8 days

## 2018-06-06 NOTE — Progress Notes (Signed)
ANTICOAGULATION CONSULT NOTE - Follow Up Consult  Pharmacy Consult for Heparin Indication: acute limb ischemia   Patient Measurements: Height: 5\' 7"  (170.2 cm) Weight: 170 lb 3.1 oz (77.2 kg) IBW/kg (Calculated) : 61.6 Heparin Dosing Weight:  72 kg  Vital Signs: Temp: 98.3 F (36.8 C) (03/07 0845) Temp Source: Oral (03/07 0845) BP: 148/53 (03/07 0845) Pulse Rate: 76 (03/07 0914)  Labs: Recent Labs    06/04/18 0410 06/05/18 0222 06/06/18 0337  HGB 8.4* 7.9* 7.8*  HCT 28.4* 25.9* 25.3*  PLT 359 369 344  HEPARINUNFRC 0.40 0.66 0.47  CREATININE 0.92 0.66 0.67     Assessment: RLE bypass occlusion s/p thromboembolectomy, 5 days post-op.    Heparin level therapeutic at 0.47, on 1400 units/hr. Hgb stable at 7.8 today, plt 344. No s/sx of bleeding or infusion issues per nursing.   Goal of Therapy:  Heparin level 0.3-0.7 units/ml Monitor platelets by anticoagulation protocol: Yes   Plan:  Continue IV heparin gtt to 1400 units/hr Daily heparin level, CBC, s/s bleeding Follow-up on plan to transition to oral anticoagulant   Sherron Monday, PharmD, BCCCP Clinical Pharmacist  Pager: [phone] Phone: [phone] 06/06/2018 11:18 AM

## 2018-06-06 NOTE — Progress Notes (Addendum)
  Progress Note    06/06/2018 8:54 AM 6 Days Post-Op  Subjective:  C/o tenderness in right lower abdomen  Afebrile HR  60's-90's NSR 130's-140's systolic 100% 3LO2NC  Vitals:   06/06/18 0845 06/06/18 0852  BP: (!) 148/53   Pulse:  92  Resp:    Temp: 98.3 F (36.8 C)   SpO2: 100%     Physical Exam: Cardiac:  regular Lungs:  Non labored Incisions:  Healing nicely Extremities:  Brisk right DP/PT doppler signals; decreased motor; sensation in tact. Abdomen:  Mild tenderness RLQ with palpation.  No tenderness without palpation.  CBC    Component Value Date/Time   WBC 27.3 (H) 06/06/2018 0337   RBC 2.97 (L) 06/06/2018 0337   HGB 7.8 (L) 06/06/2018 0337   HGB 12.5 05/22/2018 1039   HCT 25.3 (L) 06/06/2018 0337   HCT 39.4 05/22/2018 1039   PLT 344 06/06/2018 0337   PLT 437 05/22/2018 1039   MCV 85.2 06/06/2018 0337   MCV 85 05/22/2018 1039   MCH 26.3 06/06/2018 0337   MCHC 30.8 06/06/2018 0337   RDW 16.8 (H) 06/06/2018 0337   RDW 14.5 05/22/2018 1039   LYMPHSABS 0.0 (L) 06/04/2018 0410   LYMPHSABS 1.7 05/22/2018 1039   MONOABS 0.3 06/04/2018 0410   EOSABS 0.0 06/04/2018 0410   EOSABS 0.3 05/22/2018 1039   BASOSABS 0.6 (H) 06/04/2018 0410   BASOSABS 0.1 05/22/2018 1039    BMET    Component Value Date/Time   NA 140 06/06/2018 0337   NA 142 05/22/2018 1039   K 3.1 (L) 06/06/2018 0337   CL 107 06/06/2018 0337   CO2 22 06/06/2018 0337   GLUCOSE 94 06/06/2018 0337   BUN 11 06/06/2018 0337   BUN 10 05/22/2018 1039   CREATININE 0.67 06/06/2018 0337   CREATININE 0.72 08/07/2012 1016   CALCIUM 8.1 (L) 06/06/2018 0337   GFRNONAA >60 06/06/2018 0337   GFRNONAA 88 08/07/2012 1016   GFRAA >60 06/06/2018 0337   GFRAA >89 08/07/2012 1016    INR    Component Value Date/Time   INR 1.05 05/17/2009 0700     Intake/Output Summary (Last 24 hours) at 06/06/2018 0854 Last data filed at 06/06/2018 0700 Gross per 24 hour  Intake 1638 ml  Output 1300 ml  Net 338 ml      Assessment:  73 y.o. female is s/p:  1.Right lower extremity thromboembolectomy 2.Right common femoral artery patch angioplasty with Dacron   6 Days Post-Op  Plan: -pt with brisk right PT/DP doppler signals; decreased motor but sensation in tact. -leukocytosis improving -right groin incision is healing; RLQ tenderness with palpation.  Will continue to monitor -DVT prophylaxis:  Heparin gtt.  Will need long term AC -continue to wear brace and mobilize    Doreatha Massed, PA-C [facility] [phone] 06/06/2018 8:54 AM  I have seen and evaluated the patient. I agree with the PA note as documented above. S/P right lower extremity thromboembolectomy.  Brisk right DP/Pt signals.  Groin c/d/i.  Leukocytosis improving today and remains afebrile.  OK to transition to PO anticoagulation and CIR.   Cephus Shelling, MD [facility] [place] Office: [phone] Pager: [phone]

## 2018-06-07 LAB — CBC
HCT: 27.3 % — ABNORMAL LOW (ref 36.0–46.0)
Hemoglobin: 8.5 g/dL — ABNORMAL LOW (ref 12.0–15.0)
MCH: 26.2 pg (ref 26.0–34.0)
MCHC: 31.1 g/dL (ref 30.0–36.0)
MCV: 84 fL (ref 80.0–100.0)
Platelets: 280 10*3/uL (ref 150–400)
RBC: 3.25 MIL/uL — ABNORMAL LOW (ref 3.87–5.11)
RDW: 17.3 % — ABNORMAL HIGH (ref 11.5–15.5)
WBC: 19.9 10*3/uL — AB (ref 4.0–10.5)
nRBC: 1.2 % — ABNORMAL HIGH (ref 0.0–0.2)

## 2018-06-07 LAB — HEPARIN LEVEL (UNFRACTIONATED)
Heparin Unfractionated: 0.22 IU/mL — ABNORMAL LOW (ref 0.30–0.70)
Heparin Unfractionated: 0.37 IU/mL (ref 0.30–0.70)

## 2018-06-07 MED ORDER — POTASSIUM CHLORIDE CRYS ER 20 MEQ PO TBCR
40.0000 meq | EXTENDED_RELEASE_TABLET | Freq: Once | ORAL | Status: AC
  Administered 2018-06-07: 40 meq via ORAL
  Filled 2018-06-07: qty 2

## 2018-06-07 MED ORDER — APIXABAN 5 MG PO TABS
5.0000 mg | ORAL_TABLET | Freq: Two times a day (BID) | ORAL | Status: DC
  Administered 2018-06-07 – 2018-06-08 (×3): 5 mg via ORAL
  Filled 2018-06-07 (×3): qty 1

## 2018-06-07 MED ORDER — IPRATROPIUM-ALBUTEROL 0.5-2.5 (3) MG/3ML IN SOLN
3.0000 mL | Freq: Two times a day (BID) | RESPIRATORY_TRACT | Status: DC
  Administered 2018-06-07 – 2018-06-08 (×3): 3 mL via RESPIRATORY_TRACT
  Filled 2018-06-07 (×3): qty 3

## 2018-06-07 NOTE — Progress Notes (Signed)
ANTICOAGULATION CONSULT NOTE - Follow Up Consult  Pharmacy Consult for Heparin Indication: acute limb ischemia   Patient Measurements: Height: 5\' 7"  (170.2 cm) Weight: 170 lb 3.1 oz (77.2 kg) IBW/kg (Calculated) : 61.6 Heparin Dosing Weight:  72 kg  Vital Signs: Temp: 97.9 F (36.6 C) (03/08 0515) Temp Source: Oral (03/08 0515) BP: 156/55 (03/08 0910) Pulse Rate: 91 (03/08 1044)  Labs: Recent Labs    06/05/18 0222 06/06/18 0337 06/07/18 0344 06/07/18 1013  HGB 7.9* 7.8* 8.5*  --   HCT 25.9* 25.3* 27.3*  --   PLT 369 344 280  --   HEPARINUNFRC 0.66 0.47 0.22* 0.37  CREATININE 0.66 0.67  --   --     Assessment: RLE bypass occlusion s/p thromboembolectomy, 5 days post-op.    Heparin level therapeutic after rate increase now at 0.37, on 1500 units/hr. Hgb stable at 8.5 today, plt 280. No s/sx of bleeding or infusion issues per nursing.   Goal of Therapy:  Heparin level 0.3-0.7 units/ml Monitor platelets by anticoagulation protocol: Yes   Plan:  Continue IV heparin gtt to 1500units/hr Daily heparin level, CBC, s/s bleeding Follow-up on plan to transition to oral anticoagulant   Sherron Monday, PharmD, BCCCP Clinical Pharmacist  Pager: [phone] Phone: [phone] 06/07/2018 12:02 PM

## 2018-06-07 NOTE — Progress Notes (Signed)
[facility] PROGRESS NOTE  [name] FKC:[id number] DOB: 1945-04-22 DOA: 05/27/2018 PCP: Junie Spencer, [name]   Narrative: 73 year old female with history of HLD, hypertension, continued tobacco use, peripheral vascular disease he s/p femoropopliteal bypass surgery in March 2002, nonobstructive coronary artery disease last heart cath on 05/17/2009 Was  admitted 2/27 after having a fall in the ED room while visiting her mom at [facility] had bilateral leg pain in the setting of her chronic back pain, wheezing, overall generalized weakness labs were normal and because of concern for dirty urine started on ceftriaxone, urine cultures came back to be negative.  Patient developed discoloration, coldness of the right lower extremity, concerning this patient is transferred to [facility]. CT angiogram chronic occlusion scar complete occlusion right iliac limb bypass with reconstitution of flow to right CFA. Found on the morning of 2/28 to have an acutely cold limb transferred emergently to [facility] for vascular eval.  Patient was found to have clot CFA, SFA profundus femoris.  Patient underwent thrombectomy, patch angioplasty on 05/31/2018.  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 -Considering patient's cough, unable to bring up the sputum treat as pneumonia -Patient has increased white blood cell count could be from steroids, closely follow-up -Also encouraged  patient to sit in the chair as tolerated to prevent atelectasis -Discontinued Solu-Medrol -WBC trending down, continues to be afebrile  ##COPD exacerbation -Keep the patient on duo nebs, Solu-Medrol -Improved  ##Smoker -Counseling done. -Patient states nicotine patches helping her, plan to continue with the nicotine patch -Patient states made a decision to quit smoking  ##Chronic low back pain status post surgery - Spinal cord stimulator 2 years previously -Continue Lyrica 200 3 times daily, Cymbalta 60 daily  ##CT evidence pancreatitis -No reports of abdominal pain-monitor  ##Hyperlipidemia - Continue fenofibrate 160 as well as Lipitor 40  ##Hypertension-uncontrolled Hold lisinopril 40 daily amlodipine 10, labetalol as needed and hydralazine as needed cont metoprolol 12.5 twice daily ##Mild hyponatremia Resolved  ##Debility -Involve physical therapy.  ##Anemia from acute blood loss -Hemoglobin stable   Lovenox Full code  Interval history/Subjective:  Denies having any complaints.  Patient is sitting and eating breakfast this morning   objective:  Vitals:  Vitals:   06/07/18 1002 06/07/18 1044  BP:    Pulse: 78 91  Resp:  19  Temp:    SpO2: 94% 93%    Exam:  Awake no distress EOMI NCAT Chest -bilateral clear to auscultation  heart S1-S2 regular no murmurs are heard Left great toe has some mottling--some mottling on 2nd toe in addition Power 5/5 Abdomen soft, nontender, nondistended  I have personally reviewed the following:   Consultants:  Vascular surgery  Procedures: Procedure Performed 3/1 1.  Right lower extremity thromboembolectomy 2.  Right common femoral artery patch angioplasty with Dacron  Repeat ABI 3/2 Summary: Right: Resting right ankle-brachial index indicates moderate right lower extremity arterial disease.  Left: Resting left ankle-brachial index indicates moderate left lower extremity arterial  disease.   Antimicrobials:  Perioperative biotics  now off Keflex  DATA   Labs: Bun creat down to 25/0.9 from peak of 20/1.5  Imaging studies:  No  Review and summation of old records:  yes  Scheduled Meds: . amLODipine  10 mg Oral Daily  . atorvastatin  40 mg Oral Daily  . budesonide (PULMICORT) nebulizer solution  0.25 mg Nebulization BID  . docusate sodium  100 mg Oral Daily  . DULoxetine  60 mg Oral Daily  . fenofibrate  160 mg Oral Daily  . ipratropium-albuterol  3 mL Nebulization BID  . lidocaine  2 patch Transdermal Q24H  . metoprolol tartrate  12.5 mg Oral BID  . nicotine  21 mg Transdermal Daily  . pantoprazole  40 mg Oral BID  . pregabalin  200 mg Oral TID   Continuous Infusions: . sodium chloride 75 mL/hr at 06/07/18 0511  . cefTRIAXone (ROCEPHIN)  IV 1 g (06/06/18 1335)  . doxycycline (VIBRAMYCIN) IV 100 mg (06/07/18 0920)  . heparin 1,500 Units/hr (06/07/18 0508)  . magnesium sulfate 1 - 4 g bolus IVPB      Principal Problem:   General weakness Active Problems:   Hyperlipidemia with target LDL less than 100   TOBACCO ABUSE   Essential hypertension   Peripheral vascular disease (HCC)   GERD   Depression   Fall   Pancreatitis   Contusion of right hip   COPD with acute exacerbation (HCC)   LOS: 9 days

## 2018-06-07 NOTE — Progress Notes (Addendum)
  Progress Note    06/07/2018 8:13 AM 7 Days Post-Op  Subjective:  No complaints;  Says her abdomen feels a little better today  Afebrile HR 70's-90's NSR 130's-140's systolic 98% 2LO2NC   Vitals:   06/06/18 2054 06/07/18 0515  BP: (!) 138/46   Pulse: 94   Resp: (!) 25   Temp: (!) 97.5 F (36.4 C) 97.9 F (36.6 C)  SpO2: 96% 94%    Physical Exam: Cardiac:  regular Lungs:  Non labored Incisions:  Clean and dry  Extremities:  Brisk doppler signals left DP/PT and palpable right PT Abdomen:  Still tender RLQ but less today  CBC    Component Value Date/Time   WBC 19.9 (H) 06/07/2018 0344   RBC 3.25 (L) 06/07/2018 0344   HGB 8.5 (L) 06/07/2018 0344   HGB 12.5 05/22/2018 1039   HCT 27.3 (L) 06/07/2018 0344   HCT 39.4 05/22/2018 1039   PLT 280 06/07/2018 0344   PLT 437 05/22/2018 1039   MCV 84.0 06/07/2018 0344   MCV 85 05/22/2018 1039   MCH 26.2 06/07/2018 0344   MCHC 31.1 06/07/2018 0344   RDW 17.3 (H) 06/07/2018 0344   RDW 14.5 05/22/2018 1039   LYMPHSABS 0.0 (L) 06/04/2018 0410   LYMPHSABS 1.7 05/22/2018 1039   MONOABS 0.3 06/04/2018 0410   EOSABS 0.0 06/04/2018 0410   EOSABS 0.3 05/22/2018 1039   BASOSABS 0.6 (H) 06/04/2018 0410   BASOSABS 0.1 05/22/2018 1039    BMET    Component Value Date/Time   NA 140 06/06/2018 0337   NA 142 05/22/2018 1039   K 3.1 (L) 06/06/2018 0337   CL 107 06/06/2018 0337   CO2 22 06/06/2018 0337   GLUCOSE 94 06/06/2018 0337   BUN 11 06/06/2018 0337   BUN 10 05/22/2018 1039   CREATININE 0.67 06/06/2018 0337   CREATININE 0.72 08/07/2012 1016   CALCIUM 8.1 (L) 06/06/2018 0337   GFRNONAA >60 06/06/2018 0337   GFRNONAA 88 08/07/2012 1016   GFRAA >60 06/06/2018 0337   GFRAA >89 08/07/2012 1016    INR    Component Value Date/Time   INR 1.05 05/17/2009 0700     Intake/Output Summary (Last 24 hours) at 06/07/2018 0813 Last data filed at 06/07/2018 9163 Gross per 24 hour  Intake 1470 ml  Output 4100 ml  Net -2630 ml      Assessment:  73 y.o. female is s/p:  1.Right lower extremity thromboembolectomy 2.Right common femoral artery patch angioplasty with Dacron  7 Days Post-Op  Plan: -pt with palpable right PT pulse -brisk doppler signals left DP/PT and toes continue to demarcate -hgb improved today as well as WBC down to 19.9k -DVT prophylaxis:  Heparin gtt-will need long term AC.  Okay to transition to po per vascular surgery.  -okay to dc when on oral AC per vascular standpoint -f/u with Dr. Randie Heinz in a couple of weeks.  Office will call to arrange appt.   Doreatha Massed, PA-C [facility] [phone] 06/07/2018 8:13 AM   I have seen and evaluated the patient. I agree with the PA note as documented above. Palpable R PT after thromboembolectomy of right leg.  OK to start PO anticoagulation and transition to CIR.  WBC 27 --> 19 today.    Cephus Shelling, MD [facility] [place] Office: [phone] Pager: [phone]

## 2018-06-07 NOTE — Progress Notes (Signed)
ANTICOAGULATION CONSULT NOTE - Follow Up Consult  Pharmacy Consult for heparin Indication: ischemic limb  Labs: Recent Labs    06/04/18 0410 06/05/18 0222 06/06/18 0337 06/07/18 0344  HGB 8.4* 7.9* 7.8* 8.5*  HCT 28.4* 25.9* 25.3* 27.3*  PLT 359 369 344 280  HEPARINUNFRC 0.40 0.66 0.47 0.22*  CREATININE 0.92 0.66 0.67  --     Assessment: 73yo female subtherapeutic on heparin after several levels at goal though had been trending down; no gtt issues or signs of bleeding per RN.  Goal of Therapy:  Heparin level 0.3-0.7 units/ml   Plan:  Will increase heparin gtt by ~1 units/kg/hr to 1500 units/hr and check level in 6 hours.    Vernard Gambles, PharmD, BCPS  06/07/2018,5:02 AM

## 2018-06-08 ENCOUNTER — Ambulatory Visit (HOSPITAL_COMMUNITY): Admission: RE | Admit: 2018-06-08 | Payer: Medicare Other | Source: Ambulatory Visit

## 2018-06-08 DIAGNOSIS — I998 Other disorder of circulatory system: Secondary | ICD-10-CM

## 2018-06-08 DIAGNOSIS — T82868A Thrombosis of vascular prosthetic devices, implants and grafts, initial encounter: Secondary | ICD-10-CM

## 2018-06-08 DIAGNOSIS — W19XXXA Unspecified fall, initial encounter: Secondary | ICD-10-CM

## 2018-06-08 LAB — HEPARIN LEVEL (UNFRACTIONATED): Heparin Unfractionated: >2.2 IU/mL — ABNORMAL HIGH (ref 0.30–0.70)

## 2018-06-08 MED ORDER — LIDOCAINE 5 % EX PTCH: 1.0000 | MEDICATED_PATCH | CUTANEOUS | 0 refills | Status: AC

## 2018-06-08 MED ORDER — NICOTINE 21 MG/24HR TD PT24: 21.0000 mg | MEDICATED_PATCH | Freq: Every day | TRANSDERMAL | 0 refills | Status: AC

## 2018-06-08 MED ORDER — OXYCODONE HCL 5 MG PO TABS: 5.0000 mg | ORAL_TABLET | ORAL | 0 refills | Status: AC | PRN

## 2018-06-08 MED ORDER — ALUM & MAG HYDROXIDE-SIMETH 200-200-20 MG/5ML PO SUSP: 15.0000 mL | ORAL | 0 refills | Status: AC | PRN

## 2018-06-08 MED ORDER — METOPROLOL TARTRATE 25 MG PO TABS: 12.5000 mg | ORAL_TABLET | Freq: Two times a day (BID) | ORAL | Status: DC

## 2018-06-08 MED ORDER — LIDOCAINE 5 % EX PTCH: 2.0000 | MEDICATED_PATCH | CUTANEOUS | 0 refills | Status: DC

## 2018-06-08 MED ORDER — LIDOCAINE 5 % EX PTCH: 1.0000 | MEDICATED_PATCH | CUTANEOUS | Status: DC

## 2018-06-08 MED ORDER — AMLODIPINE BESYLATE 10 MG PO TABS: 10.0000 mg | ORAL_TABLET | Freq: Every day | ORAL | 0 refills | Status: DC

## 2018-06-08 MED ORDER — SENNOSIDES-DOCUSATE SODIUM 8.6-50 MG PO TABS: 2.0000 | ORAL_TABLET | Freq: Every evening | ORAL | Status: DC | PRN

## 2018-06-08 MED ORDER — PANTOPRAZOLE SODIUM 40 MG PO TBEC: 40.0000 mg | DELAYED_RELEASE_TABLET | Freq: Every day | ORAL | Status: DC

## 2018-06-08 MED ORDER — SODIUM CHLORIDE 0.9 % IV SOLN
1.0000 g | INTRAVENOUS | Status: DC
  Administered 2018-06-08: 1 g via INTRAVENOUS
  Filled 2018-06-08: qty 10

## 2018-06-08 MED ORDER — DOXYCYCLINE HYCLATE 100 MG PO TABS: 100.0000 mg | ORAL_TABLET | Freq: Two times a day (BID) | ORAL | Status: DC

## 2018-06-08 MED ORDER — APIXABAN 5 MG PO TABS: 5.0000 mg | ORAL_TABLET | Freq: Two times a day (BID) | ORAL | Status: DC

## 2018-06-08 NOTE — Progress Notes (Signed)
Occupational Therapy Treatment Patient Details Name: NATANIA TOYNE MRN: [id number] DOB: 07/22/1945 Today's Date: 06/08/2018    History of present illness 73 yo female admitted 2/27 after having a fall with bilateral leg pain, weakness, and wheezing. CT revealed occlusion of R iliac limb bypass. Now s/p RIGHT FEMORAL THROMBECTOMY (Right ) PATCH ANGIOPLASTY RIGHT FEMORAL ARTERY WITH DECRON PATCH. Pmh of HLD, HTN, PVD, history of back surgeries.   OT comments  Pt progressing toward OT goals this session. Pt requiring min guard during BSC transfer and standing level peri hygiene. Min A for advancing R LE to EOB during bed mobility. Pt required cueing for activity pacing and safety awareness. Pt ready for SNF d/c.    Follow Up Recommendations  Home health OT;Supervision/Assistance - 24 hour    Equipment Recommendations  3 in 1 bedside commode    Recommendations for Other Services PT consult    Precautions / Restrictions Precautions Precautions: Fall Required Braces or Orthoses: Other Brace Other Brace: R AFO Restrictions Weight Bearing Restrictions: No       Mobility Bed Mobility Overal bed mobility: Needs Assistance Bed Mobility: Supine to Sit     Supine to sit: Min guard     General bed mobility comments: pt seeking handheld assist for trunk elevation  Transfers Overall transfer level: Needs assistance Equipment used: None Transfers: Sit to/from [employer] Sit to Stand: Min guard Stand pivot transfers: Min guard       General transfer comment: cues for safety and pacing    Balance Overall balance assessment: Needs assistance Sitting-balance support: Feet supported;No upper extremity supported Sitting balance-Leahy Scale: Good Sitting balance - Comments: Pt able to reach forward    Standing balance support: During functional activity;Bilateral upper extremity supported Standing balance-Leahy Scale: Poor Standing balance comment: moderate  reliance on UE support                           ADL either performed or assessed with clinical judgement   ADL Overall ADL's : Needs assistance/impaired             Lower Body Bathing: Min guard;Sit to/from stand;Cueing for safety;With adaptive equipment           Toilet Transfer: Min guard;Cueing for [employer] Transfer Details (indicate cue type and reason): cueing for safety Toileting- Clothing Manipulation and Hygiene: Cueing for safety;Sit to/from stand;Min guard       Functional mobility during ADLs: Min guard General ADL Comments: Min guard during SPT to [facility], cueing for activity pacing                Cognition Arousal/Alertness: Awake/alert Behavior During Therapy: Flat affect Overall Cognitive Status: Within Functional Limits for tasks assessed                                                     Pertinent Vitals/ Pain       Pain Assessment: No/denies pain         Frequency  Min 2X/week        Progress Toward Goals  OT Goals(current goals can now be found in the care plan section)  Progress towards OT goals: Progressing toward goals  Acute Rehab OT Goals Patient Stated Goal: return home OT Goal Formulation: With patient Time For Goal  Achievement: 06/15/18 Potential to Achieve Goals: Good  Plan Discharge plan remains appropriate       AM-PAC OT "6 Clicks" Daily Activity     Outcome Measure   Help from another person eating meals?: None Help from another person taking care of personal grooming?: A Little Help from another person toileting, which includes using toliet, bedpan, or urinal?: A Little Help from another person bathing (including washing, rinsing, drying)?: A Little Help from another person to put on and taking off regular upper body clothing?: A Little Help from another person to put on and taking off regular lower body clothing?: A Little 6 Click Score: 19    End of  Session Equipment Utilized During Treatment: Gait belt  OT Visit Diagnosis: Other abnormalities of gait and mobility (R26.89);Muscle weakness (generalized) (M62.81)   Activity Tolerance Patient tolerated treatment well   Patient Left in bed;with bed alarm set;with call bell/phone within reach   Nurse Communication Mobility status        Time: 1610-9604 OT Time Calculation (min): 10 min  Charges: OT General Charges $OT Visit: 1 Visit OT Treatments $Self Care/Home Management : 8-22 mins   Crissie Reese OTR/L 06/08/2018, 4:46 PM

## 2018-06-08 NOTE — Progress Notes (Signed)
Patient will DC to: [facility] DC Date: 06/08/2018 Family Notified: [name], daughter Transport By: Sharin Mons  RN, patient, and facility notified of DC. Discharge Summary sent to facility. RN given number for [phone]. Ambulance transport requested for patient.   Clinical Social Worker signing off. Antony Blackbird, MSW, [facility] Clinical Social Worker [phone]'

## 2018-06-08 NOTE — Progress Notes (Signed)
Pt d/c to SNF [facility] via [place]. Discharge AVS printed and provided for PTAR transport to facility. Pt iv and tele d/c, CCMD notified.

## 2018-06-08 NOTE — Discharge Summary (Signed)
Physician Discharge Summary  Patient ID: [name] MRN: [id number] DOB/AGE: 1945/07/27 73 y.o.  Admit date: 05/27/2018 Discharge date: 06/08/2018  Admission Diagnoses: Principal Problem:   Ischemic R lower extremity   Acute/chronic aorto bifemoral right limb thrombosis   General weakness   Hyperlipidemia with target LDL less than 100   TOBACCO ABUSE   Essential hypertension   Peripheral vascular disease (HCC)   GERD   Depression   Fall   Pancreatitis   Contusion of right hip   COPD with acute exacerbation (HCC)  Discharge Diagnoses:    Ischemic R lower extremity   Acute/chronic aorto bifemoral right limb thrombosis, sp thromboembolectomy    General weakness   Hyperlipidemia with target LDL less than 100   TOBACCO ABUSE   Essential hypertension   Peripheral vascular disease (HCC)   GERD   Depression   Fall   Pancreatitis   Contusion of right hip   COPD with acute exacerbation [facility])   Discharged Condition: good  Presentation Summary: 73 year old female with history of HLD, hypertension, continued tobacco use, peripheral vascular disease he s/p femoropopliteal bypass surgery in March 2002, nonobstructive coronary artery disease last heart cath on 05/17/2009. Pt was  admitted 2/27 after having a fall in the ED room while visiting her mom at [facility] had bilateral leg pain in the setting of her chronic back pain, wheezing, overall generalized weakness labs were normal and because of concern for dirty urine started on ceftriaxone, urine cultures came back to be negative.  While at [facility], patient developed discoloration + coldness of the right lower extremity, concerning this patient was transferred to [facility]. CT angiogram chronic occlusion scar complete occlusion right iliac limb bypass with reconstitution of flow to right CFA. Patient was found to have clot CFA, SFA profundus femoris.  Patient underwent thrombectomy, patch angioplasty on 05/31/2018.  Discontinued  heparin drip, started the patient on [facility] Course:   Acute/chronic aorto bifemoral right limb thrombosis: underwent thromboembolectomy performed 05/31/18 with patch angioplasty. Doing well and ready for dc to SNF per VVS - continue Lidoderm patch - started on Eliquis 5 mg twice daily per [facility]VS for graft patency - f/u [facility] in 2 weeks  AKI 2/2 ATN -improved with IV fluids, creat 0.67 on dc  Leukocytosis -Get chest x-ray-no acute abnormality -Considering patient's cough, we decided to treat as pneumonia, the ^^ WBC was felt to be IV steroids and/or PNA, now trending down, remains afebrile, will dc doxy/ Rocephin , she has had 7 days of abx for this. WBC down to 19k today at dc  COPD exacerbation - resolved  Smoker -Counseling done. -Patient states nicotine patches helping her, plan to continue with the nicotine patch -Patient states made a decision to quit smoking  Chronic low back pain status post surgery - Spinal cord stimulator 2 years previously -Continue Lyrica 200 3 times daily, Cymbalta 60 daily  CT evidence pancreatitis  -No reports of abdominal pain  Hyperlipidemia - Continue fenofibrate 160 as well as Lipitor 40  Hypertension - stable BP's - home lisinopril was dc'd for now - BP controlled with amlodipine and low dose metoprolol bid   Debility -Involved  physical therapy, recommended dc to SNF - plan is dc to SNF today  Anemia from acute blood loss -Hemoglobin stable 8.5 last check   Consultants:  Vascular surgery  Procedure Performed 3/1 1.Right lower extremity thromboembolectomy 2.Right common femoral artery patch angioplasty with Dacron  Repeat ABI 3/2 Summary: Right: Resting  right ankle-brachial index indicates moderate right lower extremity arterial disease. Left: Resting left ankle-brachial index indicates moderate left lower extremity arterial disease.   Discharge Exam: Blood pressure (!) 130/57, pulse 79, temperature  98.1 F (36.7 C), temperature source Oral, resp. rate 17, height 5\' 7"  (1.702 m), weight 77.2 kg, SpO2 95 %. Alert , up in chair, no distress No jvd Chest cta bilat Cor reg no mrg Abd soft obese ntnd  Right PT palpable, edema in the right foot Left foot toes dark, intact active range of motion.  Brisk dopplers DP/PT. Right groin incision healing well  Disposition: Discharge disposition: 03-Skilled Nursing Facility       Allergies as of 06/08/2018   No Known Allergies     Medication List    STOP taking these medications   lisinopril 20 MG tablet Commonly known as:  PRINIVIL,ZESTRIL   meloxicam 15 MG tablet Commonly known as:  MOBIC     TAKE these medications   alum & mag hydroxide-simeth 200-200-20 MG/5ML suspension Commonly known as:  MAALOX/MYLANTA Take 15-30 mLs by mouth every 2 (two) hours as needed for indigestion.   amLODipine 10 MG tablet Commonly known as:  NORVASC Take 1 tablet (10 mg total) by mouth daily.   apixaban 5 MG Tabs tablet Commonly known as:  ELIQUIS Take 1 tablet (5 mg total) by mouth 2 (two) times daily.   atorvastatin 40 MG tablet Commonly known as:  LIPITOR Take 1 tablet (40 mg total) by mouth daily.   DULoxetine 60 MG capsule Commonly known as:  CYMBALTA Take 1 capsule (60 mg total) by mouth daily.   fenofibrate 160 MG tablet TAKE 1 TABLET BY MOUTH ONCE DAILY   lidocaine 5 % Commonly known as:  LIDODERM Place 1 patch onto the skin daily. Remove & Discard patch within 12 hours or as directed by MD   metoprolol tartrate 25 MG tablet Commonly known as:  LOPRESSOR Take 0.5 tablets (12.5 mg total) by mouth 2 (two) times daily.   nicotine 21 mg/24hr patch Commonly known as:  NICODERM CQ - dosed in mg/24 hours Place 1 patch (21 mg total) onto the skin daily.   omeprazole 20 MG capsule Commonly known as:  PRILOSEC Take 1 capsule (20 mg total) by mouth daily.   oxyCODONE 5 MG immediate release tablet Commonly known as:  Oxy  IR/ROXICODONE Take 1-2 tablets (5-10 mg total) by mouth every 4 (four) hours as needed for up to 7 days for moderate pain.   pregabalin 200 MG capsule Commonly known as:  LYRICA Take 1 capsule by mouth 3 (three) times daily.   senna-docusate 8.6-50 MG tablet Commonly known as:  Senokot-S Take 2 tablets by mouth at bedtime as needed for mild constipation or moderate constipation.   tiZANidine 4 MG tablet Commonly known as:  ZANAFLEX Take 1-2 tablets (4-8 mg total) by mouth every 8 (eight) hours as needed for muscle spasms.            Durable Medical Equipment  (From admission, onward)         Start     Ordered   05/29/18 1356  For home use only DME Walker rolling  Once    Question:  Patient needs a walker to treat with the following condition  Answer:  Generalized weakness   05/29/18 1355          Contact information for follow-up providers    [facility] Follow up.   Why:  home health  PT Contact information: [address] [place] [address] [phone]        Junie Spencer, [name]. Call in 1 day(s).   Specialty:  Family Medicine Why:  Please call for a post hospital follow-up appointment. Contact information: [address] [place] [address] [phone]        Maeola Harman, MD In 2 weeks.   Specialties:  Vascular Surgery, Cardiology Why:  Office will call you to arrange your appt (sent) Contact information: [address] [place] [address] [phone]            Contact information for after-discharge care    Destination    [facility].   Service:  Skilled Nursing Contact information: [address] [place] [address] [phone]                  Signed: Maree Krabbe 06/08/2018, 2:14 PM

## 2018-06-08 NOTE — Progress Notes (Signed)
Report called to [facility]. All questions answered. Pt awaiting PTAR to go to the facility. Will continue to monitor.

## 2018-06-08 NOTE — Care Management Important Message (Signed)
Important Message  Patient Details  Name: [name] MRN: [id number] Date of Birth: 1945/08/17   Medicare Important Message Given:  Yes    Victory Dresden P Sinaya Minogue 06/08/2018, 2:45 PM

## 2018-06-08 NOTE — Clinical Social Work Placement (Signed)
   CLINICAL SOCIAL WORK PLACEMENT  NOTE  Date:  06/08/2018  Patient Details  Name: [name] MRN: [id number] Date of Birth: 05-18-1945  Clinical Social Work is seeking post-discharge placement for this patient at the Skilled  Nursing Facility level of care (*CSW will initial, date and re-position this form in  chart as items are completed):  Yes   Patient/family provided with [place] Clinical Social Work Department's list of facilities offering this level of care within the geographic area requested by the patient (or if unable, by the patient's family).  Yes   Patient/family informed of their freedom to choose among providers that offer the needed level of care, that participate in Medicare, Medicaid or managed care program needed by the patient, have an available bed and are willing to accept the patient.  Yes   Patient/family informed of [facility]'s ownership interest in [facility] and [facility], as well as of the fact that they are under no obligation to receive care at these facilities.  PASRR submitted to EDS on       PASRR number received on 06/03/18     Existing PASRR number confirmed on       FL2 transmitted to all facilities in geographic area requested by pt/family on       FL2 transmitted to all facilities within larger geographic area on       Patient informed that his/her managed care company has contracts with or will negotiate with certain facilities, including the following:        Yes   Patient/family informed of bed offers received.  Patient chooses bed at [facility] )     Physician recommends and patient chooses bed at      Patient to be transferred to [facility]) on  .  Patient to be transferred to facility by PTAR      Patient family notified on 06/08/18 of transfer.  Name of family member notified:  [name], daughter      PHYSICIAN       Additional Comment:    _______________________________________________ Eduard Roux, LCSWA 06/08/2018, 2:41 PM

## 2018-06-08 NOTE — Progress Notes (Addendum)
[facility]  Subjective  - No new complaints    Objective (!) 137/59 77 98.9 F (37.2 C) (Oral) 18 92%  Intake/Output Summary (Last 24 hours) at 06/08/2018 0746 Last data filed at 06/08/2018 0000 Gross per 24 hour  Intake 2545.3 ml  Output 2200 ml  Net 345.3 ml    Right PT palpable, edema in the right foot Left foot toes dark, intact active range of motion.  Brisk dopplers DP/PT. Right groin incision healing well  Assessment/Planning: POD # 8  Procedure Performed: 1.  Right lower extremity thromboembolectomy 2.  Right common femoral artery patch angioplasty with Dacron  Elevation for edema Pending SNF per PT recommendation  Patent arterial blood flow to B LE F/U with DR. Darcus Edds in 2 weeks   [name] 06/08/2018 7:46 AM --  Laboratory Lab Results: Recent Labs    06/06/18 0337 06/07/18 0344  WBC 27.3* 19.9*  HGB 7.8* 8.5*  HCT 25.3* 27.3*  PLT 344 280   BMET Recent Labs    06/06/18 0337  NA 140  K 3.1*  CL 107  CO2 22  GLUCOSE 94  BUN 11  CREATININE 0.67  CALCIUM 8.1*    COAG Lab Results  Component Value Date   INR 1.05 05/17/2009   INR 1.02 05/16/2009   INR 2.9 (H) 02/08/2008   No results found for: PTT  I have independently interviewed and examined patient and agree with PA assessment and plan above.   Shasha Buchbinder C. Randie Heinz, MD [facility] Office: [phone] Pager: [phone]

## 2018-06-09 ENCOUNTER — Encounter: Payer: Self-pay | Admitting: Family

## 2018-07-07 ENCOUNTER — Encounter (HOSPITAL_COMMUNITY): Payer: Self-pay | Admitting: Emergency Medicine

## 2018-07-07 ENCOUNTER — Emergency Department (HOSPITAL_COMMUNITY): Payer: Medicare Other

## 2018-07-07 ENCOUNTER — Inpatient Hospital Stay (HOSPITAL_COMMUNITY)
Admission: EM | Admit: 2018-07-07 | Discharge: 2018-08-08 | DRG: 853 | Disposition: A | Discharge status: Hospice | Payer: Medicare Other | Attending: Internal Medicine | Admitting: Internal Medicine

## 2018-07-07 ENCOUNTER — Other Ambulatory Visit: Payer: Self-pay

## 2018-07-07 DIAGNOSIS — R1310 Dysphagia, unspecified: Secondary | ICD-10-CM | POA: Diagnosis not present

## 2018-07-07 DIAGNOSIS — M545 Low back pain: Secondary | ICD-10-CM | POA: Diagnosis not present

## 2018-07-07 DIAGNOSIS — R0902 Hypoxemia: Secondary | ICD-10-CM | POA: Diagnosis not present

## 2018-07-07 DIAGNOSIS — R933 Abnormal findings on diagnostic imaging of other parts of digestive tract: Secondary | ICD-10-CM | POA: Diagnosis not present

## 2018-07-07 DIAGNOSIS — Z79899 Other long term (current) drug therapy: Secondary | ICD-10-CM

## 2018-07-07 DIAGNOSIS — K651 Peritoneal abscess: Secondary | ICD-10-CM | POA: Diagnosis not present

## 2018-07-07 DIAGNOSIS — K2970 Gastritis, unspecified, without bleeding: Secondary | ICD-10-CM | POA: Diagnosis not present

## 2018-07-07 DIAGNOSIS — T8132XA Disruption of internal operation (surgical) wound, not elsewhere classified, initial encounter: Secondary | ICD-10-CM | POA: Diagnosis not present

## 2018-07-07 DIAGNOSIS — I10 Essential (primary) hypertension: Secondary | ICD-10-CM | POA: Diagnosis present

## 2018-07-07 DIAGNOSIS — J189 Pneumonia, unspecified organism: Secondary | ICD-10-CM | POA: Diagnosis present

## 2018-07-07 DIAGNOSIS — K2960 Other gastritis without bleeding: Secondary | ICD-10-CM | POA: Diagnosis present

## 2018-07-07 DIAGNOSIS — I4891 Unspecified atrial fibrillation: Secondary | ICD-10-CM | POA: Diagnosis not present

## 2018-07-07 DIAGNOSIS — K631 Perforation of intestine (nontraumatic): Secondary | ICD-10-CM | POA: Diagnosis present

## 2018-07-07 DIAGNOSIS — F329 Major depressive disorder, single episode, unspecified: Secondary | ICD-10-CM | POA: Diagnosis present

## 2018-07-07 DIAGNOSIS — K621 Rectal polyp: Secondary | ICD-10-CM | POA: Diagnosis not present

## 2018-07-07 DIAGNOSIS — R6521 Severe sepsis with septic shock: Secondary | ICD-10-CM

## 2018-07-07 DIAGNOSIS — I96 Gangrene, not elsewhere classified: Secondary | ICD-10-CM | POA: Diagnosis not present

## 2018-07-07 DIAGNOSIS — E43 Unspecified severe protein-calorie malnutrition: Secondary | ICD-10-CM | POA: Diagnosis present

## 2018-07-07 DIAGNOSIS — Y95 Nosocomial condition: Secondary | ICD-10-CM | POA: Diagnosis present

## 2018-07-07 DIAGNOSIS — A419 Sepsis, unspecified organism: Secondary | ICD-10-CM | POA: Diagnosis present

## 2018-07-07 DIAGNOSIS — M6250 Muscle wasting and atrophy, not elsewhere classified, unspecified site: Secondary | ICD-10-CM | POA: Diagnosis not present

## 2018-07-07 DIAGNOSIS — B370 Candidal stomatitis: Secondary | ICD-10-CM | POA: Diagnosis not present

## 2018-07-07 DIAGNOSIS — K567 Ileus, unspecified: Secondary | ICD-10-CM | POA: Diagnosis not present

## 2018-07-07 DIAGNOSIS — K219 Gastro-esophageal reflux disease without esophagitis: Secondary | ICD-10-CM | POA: Diagnosis present

## 2018-07-07 DIAGNOSIS — Z933 Colostomy status: Secondary | ICD-10-CM | POA: Diagnosis not present

## 2018-07-07 DIAGNOSIS — Z95820 Peripheral vascular angioplasty status with implants and grafts: Secondary | ICD-10-CM | POA: Diagnosis not present

## 2018-07-07 DIAGNOSIS — Z7189 Other specified counseling: Secondary | ICD-10-CM

## 2018-07-07 DIAGNOSIS — Z66 Do not resuscitate: Secondary | ICD-10-CM | POA: Diagnosis not present

## 2018-07-07 DIAGNOSIS — I774 Celiac artery compression syndrome: Secondary | ICD-10-CM | POA: Diagnosis present

## 2018-07-07 DIAGNOSIS — K828 Other specified diseases of gallbladder: Secondary | ICD-10-CM | POA: Diagnosis present

## 2018-07-07 DIAGNOSIS — G92 Toxic encephalopathy: Secondary | ICD-10-CM | POA: Diagnosis not present

## 2018-07-07 DIAGNOSIS — F411 Generalized anxiety disorder: Secondary | ICD-10-CM | POA: Diagnosis present

## 2018-07-07 DIAGNOSIS — D72829 Elevated white blood cell count, unspecified: Secondary | ICD-10-CM | POA: Diagnosis not present

## 2018-07-07 DIAGNOSIS — Z515 Encounter for palliative care: Secondary | ICD-10-CM | POA: Diagnosis not present

## 2018-07-07 DIAGNOSIS — I739 Peripheral vascular disease, unspecified: Secondary | ICD-10-CM | POA: Diagnosis present

## 2018-07-07 DIAGNOSIS — E785 Hyperlipidemia, unspecified: Secondary | ICD-10-CM | POA: Diagnosis present

## 2018-07-07 DIAGNOSIS — I482 Chronic atrial fibrillation, unspecified: Secondary | ICD-10-CM | POA: Diagnosis present

## 2018-07-07 DIAGNOSIS — K551 Chronic vascular disorders of intestine: Secondary | ICD-10-CM | POA: Diagnosis present

## 2018-07-07 DIAGNOSIS — Z7901 Long term (current) use of anticoagulants: Secondary | ICD-10-CM

## 2018-07-07 DIAGNOSIS — D62 Acute posthemorrhagic anemia: Secondary | ICD-10-CM | POA: Diagnosis not present

## 2018-07-07 DIAGNOSIS — F17210 Nicotine dependence, cigarettes, uncomplicated: Secondary | ICD-10-CM | POA: Diagnosis present

## 2018-07-07 DIAGNOSIS — F17200 Nicotine dependence, unspecified, uncomplicated: Secondary | ICD-10-CM | POA: Diagnosis not present

## 2018-07-07 DIAGNOSIS — Z981 Arthrodesis status: Secondary | ICD-10-CM

## 2018-07-07 DIAGNOSIS — J449 Chronic obstructive pulmonary disease, unspecified: Secondary | ICD-10-CM | POA: Diagnosis not present

## 2018-07-07 DIAGNOSIS — Z9889 Other specified postprocedural states: Secondary | ICD-10-CM | POA: Diagnosis not present

## 2018-07-07 DIAGNOSIS — R630 Anorexia: Secondary | ICD-10-CM | POA: Diagnosis not present

## 2018-07-07 DIAGNOSIS — E876 Hypokalemia: Secondary | ICD-10-CM | POA: Diagnosis not present

## 2018-07-07 DIAGNOSIS — Z9181 History of falling: Secondary | ICD-10-CM

## 2018-07-07 DIAGNOSIS — K659 Peritonitis, unspecified: Secondary | ICD-10-CM | POA: Diagnosis not present

## 2018-07-07 DIAGNOSIS — Z48812 Encounter for surgical aftercare following surgery on the circulatory system: Secondary | ICD-10-CM | POA: Diagnosis not present

## 2018-07-07 DIAGNOSIS — R627 Adult failure to thrive: Secondary | ICD-10-CM | POA: Diagnosis present

## 2018-07-07 DIAGNOSIS — K863 Pseudocyst of pancreas: Secondary | ICD-10-CM | POA: Diagnosis present

## 2018-07-07 DIAGNOSIS — R110 Nausea: Secondary | ICD-10-CM | POA: Diagnosis not present

## 2018-07-07 DIAGNOSIS — R188 Other ascites: Secondary | ICD-10-CM | POA: Diagnosis not present

## 2018-07-07 DIAGNOSIS — Y92230 Patient room in hospital as the place of occurrence of the external cause: Secondary | ICD-10-CM | POA: Diagnosis not present

## 2018-07-07 DIAGNOSIS — Z8719 Personal history of other diseases of the digestive system: Secondary | ICD-10-CM | POA: Diagnosis not present

## 2018-07-07 DIAGNOSIS — J440 Chronic obstructive pulmonary disease with acute lower respiratory infection: Secondary | ICD-10-CM | POA: Diagnosis present

## 2018-07-07 DIAGNOSIS — R634 Abnormal weight loss: Secondary | ICD-10-CM | POA: Diagnosis not present

## 2018-07-07 DIAGNOSIS — E7800 Pure hypercholesterolemia, unspecified: Secondary | ICD-10-CM | POA: Diagnosis present

## 2018-07-07 DIAGNOSIS — L7634 Postprocedural seroma of skin and subcutaneous tissue following other procedure: Secondary | ICD-10-CM | POA: Diagnosis present

## 2018-07-07 DIAGNOSIS — R945 Abnormal results of liver function studies: Secondary | ICD-10-CM

## 2018-07-07 DIAGNOSIS — W19XXXA Unspecified fall, initial encounter: Secondary | ICD-10-CM | POA: Diagnosis not present

## 2018-07-07 DIAGNOSIS — K559 Vascular disorder of intestine, unspecified: Secondary | ICD-10-CM | POA: Diagnosis not present

## 2018-07-07 DIAGNOSIS — I480 Paroxysmal atrial fibrillation: Secondary | ICD-10-CM | POA: Diagnosis present

## 2018-07-07 DIAGNOSIS — R1030 Lower abdominal pain, unspecified: Secondary | ICD-10-CM | POA: Diagnosis not present

## 2018-07-07 DIAGNOSIS — T50915A Adverse effect of multiple unspecified drugs, medicaments and biological substances, initial encounter: Secondary | ICD-10-CM | POA: Diagnosis not present

## 2018-07-07 DIAGNOSIS — I2510 Atherosclerotic heart disease of native coronary artery without angina pectoris: Secondary | ICD-10-CM | POA: Diagnosis not present

## 2018-07-07 DIAGNOSIS — Y836 Removal of other organ (partial) (total) as the cause of abnormal reaction of the patient, or of later complication, without mention of misadventure at the time of the procedure: Secondary | ICD-10-CM | POA: Diagnosis not present

## 2018-07-07 DIAGNOSIS — G8929 Other chronic pain: Secondary | ICD-10-CM | POA: Diagnosis present

## 2018-07-07 DIAGNOSIS — Z95828 Presence of other vascular implants and grafts: Secondary | ICD-10-CM

## 2018-07-07 DIAGNOSIS — K2950 Unspecified chronic gastritis without bleeding: Secondary | ICD-10-CM | POA: Diagnosis not present

## 2018-07-07 DIAGNOSIS — K660 Peritoneal adhesions (postprocedural) (postinfection): Secondary | ICD-10-CM | POA: Diagnosis present

## 2018-07-07 DIAGNOSIS — M1712 Unilateral primary osteoarthritis, left knee: Secondary | ICD-10-CM | POA: Diagnosis present

## 2018-07-07 DIAGNOSIS — Z9989 Dependence on other enabling machines and devices: Secondary | ICD-10-CM | POA: Diagnosis not present

## 2018-07-07 DIAGNOSIS — Z6820 Body mass index (BMI) 20.0-20.9, adult: Secondary | ICD-10-CM

## 2018-07-07 DIAGNOSIS — K255 Chronic or unspecified gastric ulcer with perforation: Secondary | ICD-10-CM

## 2018-07-07 DIAGNOSIS — Z978 Presence of other specified devices: Secondary | ICD-10-CM | POA: Diagnosis not present

## 2018-07-07 DIAGNOSIS — R740 Nonspecific elevation of levels of transaminase and lactic acid dehydrogenase [LDH]: Secondary | ICD-10-CM | POA: Diagnosis present

## 2018-07-07 DIAGNOSIS — K55059 Acute (reversible) ischemia of intestine, part and extent unspecified: Secondary | ICD-10-CM | POA: Diagnosis not present

## 2018-07-07 DIAGNOSIS — R112 Nausea with vomiting, unspecified: Secondary | ICD-10-CM | POA: Diagnosis present

## 2018-07-07 LAB — URINALYSIS, ROUTINE W REFLEX MICROSCOPIC
Bilirubin Urine: NEGATIVE
Glucose, UA: 50 mg/dL — AB
Hgb urine dipstick: NEGATIVE
Ketones, ur: NEGATIVE mg/dL
Leukocytes,Ua: NEGATIVE
Nitrite: NEGATIVE
Protein, ur: NEGATIVE mg/dL
Specific Gravity, Urine: 1.014 (ref 1.005–1.030)
pH: 5 (ref 5.0–8.0)

## 2018-07-07 LAB — CBC WITH DIFFERENTIAL/PLATELET
Abs Immature Granulocytes: 0.23 10*3/uL — ABNORMAL HIGH (ref 0.00–0.07)
Basophils Absolute: 0.1 10*3/uL (ref 0.0–0.1)
Basophils Relative: 0 %
Eosinophils Absolute: 0.1 10*3/uL (ref 0.0–0.5)
Eosinophils Relative: 0 %
HCT: 35.7 % — ABNORMAL LOW (ref 36.0–46.0)
Hemoglobin: 10.9 g/dL — ABNORMAL LOW (ref 12.0–15.0)
Immature Granulocytes: 1 %
Lymphocytes Relative: 6 %
Lymphs Abs: 1.6 10*3/uL (ref 0.7–4.0)
MCH: 25.8 pg — ABNORMAL LOW (ref 26.0–34.0)
MCHC: 30.5 g/dL (ref 30.0–36.0)
MCV: 84.6 fL (ref 80.0–100.0)
Monocytes Absolute: 2.9 10*3/uL — ABNORMAL HIGH (ref 0.1–1.0)
Monocytes Relative: 11 %
Neutro Abs: 22.6 10*3/uL — ABNORMAL HIGH (ref 1.7–7.7)
Neutrophils Relative %: 82 %
Platelets: 424 10*3/uL — ABNORMAL HIGH (ref 150–400)
RBC: 4.22 MIL/uL (ref 3.87–5.11)
RDW: 18 % — ABNORMAL HIGH (ref 11.5–15.5)
WBC: 27.4 10*3/uL — ABNORMAL HIGH (ref 4.0–10.5)
nRBC: 0.5 % — ABNORMAL HIGH (ref 0.0–0.2)

## 2018-07-07 LAB — APTT: aPTT: 41 seconds — ABNORMAL HIGH (ref 24–36)

## 2018-07-07 LAB — COMPREHENSIVE METABOLIC PANEL
ALT: 21 U/L (ref 0–44)
AST: 50 U/L — ABNORMAL HIGH (ref 15–41)
Albumin: 2.1 g/dL — ABNORMAL LOW (ref 3.5–5.0)
Alkaline Phosphatase: 150 U/L — ABNORMAL HIGH (ref 38–126)
Anion gap: 10 (ref 5–15)
BUN: 12 mg/dL (ref 8–23)
CO2: 22 mmol/L (ref 22–32)
Calcium: 8.3 mg/dL — ABNORMAL LOW (ref 8.9–10.3)
Chloride: 100 mmol/L (ref 98–111)
Creatinine, Ser: 0.76 mg/dL (ref 0.44–1.00)
GFR calc Af Amer: >60 mL/min (ref >60)
GFR calc non Af Amer: >60 mL/min (ref >60)
Glucose, Bld: 166 mg/dL — ABNORMAL HIGH (ref 70–99)
Potassium: 3.4 mmol/L — ABNORMAL LOW (ref 3.5–5.1)
Sodium: 132 mmol/L — ABNORMAL LOW (ref 135–145)
Total Bilirubin: 0.9 mg/dL (ref 0.3–1.2)
Total Protein: 6.3 g/dL — ABNORMAL LOW (ref 6.5–8.1)

## 2018-07-07 LAB — TROPONIN I: Troponin I: <0.03 ng/mL (ref <0.03)

## 2018-07-07 LAB — PROTIME-INR
INR: 1.7 — ABNORMAL HIGH (ref 0.8–1.2)
Prothrombin Time: 19.9 seconds — ABNORMAL HIGH (ref 11.4–15.2)

## 2018-07-07 LAB — LACTIC ACID, PLASMA
Lactic Acid, Venous: 2.3 mmol/L (ref 0.5–1.9)
Lactic Acid, Venous: 1.6 mmol/L (ref 0.5–1.9)

## 2018-07-07 LAB — CK: Total CK: 30 U/L — ABNORMAL LOW (ref 38–234)

## 2018-07-07 LAB — CBG MONITORING, ED: Glucose-Capillary: 150 mg/dL — ABNORMAL HIGH (ref 70–99)

## 2018-07-07 MED ORDER — ATORVASTATIN CALCIUM 40 MG PO TABS
40.0000 mg | ORAL_TABLET | Freq: Every day | ORAL | Status: DC
  Administered 2018-07-08 – 2018-07-26 (×16): 40 mg via ORAL
  Filled 2018-07-07 (×17): qty 1

## 2018-07-07 MED ORDER — NICOTINE 21 MG/24HR TD PT24
21.0000 mg | MEDICATED_PATCH | Freq: Every day | TRANSDERMAL | Status: DC
  Administered 2018-07-08 – 2018-08-06 (×28): 21 mg via TRANSDERMAL
  Filled 2018-07-07 (×30): qty 1

## 2018-07-07 MED ORDER — SODIUM CHLORIDE 0.9 % IV BOLUS (SEPSIS)
250.0000 mL | Freq: Once | INTRAVENOUS | Status: AC
  Administered 2018-07-08: 250 mL via INTRAVENOUS

## 2018-07-07 MED ORDER — APIXABAN 5 MG PO TABS
5.0000 mg | ORAL_TABLET | Freq: Two times a day (BID) | ORAL | Status: DC
  Administered 2018-07-08 (×2): 5 mg via ORAL
  Filled 2018-07-07 (×2): qty 1

## 2018-07-07 MED ORDER — SODIUM CHLORIDE 0.9 % IV BOLUS (SEPSIS)
1000.0000 mL | Freq: Once | INTRAVENOUS | Status: AC
  Administered 2018-07-07: 19:00:00 1000 mL via INTRAVENOUS

## 2018-07-07 MED ORDER — FENOFIBRATE 160 MG PO TABS
160.0000 mg | ORAL_TABLET | Freq: Every day | ORAL | Status: DC
  Administered 2018-07-08 – 2018-07-11 (×4): 160 mg via ORAL
  Filled 2018-07-07 (×6): qty 1

## 2018-07-07 MED ORDER — SODIUM CHLORIDE 0.9 % IV BOLUS (SEPSIS)
1000.0000 mL | Freq: Once | INTRAVENOUS | Status: AC
  Administered 2018-07-07: 1000 mL via INTRAVENOUS

## 2018-07-07 MED ORDER — SENNOSIDES-DOCUSATE SODIUM 8.6-50 MG PO TABS: 2.0000 | ORAL_TABLET | Freq: Every evening | ORAL | Status: DC | PRN

## 2018-07-07 MED ORDER — ALUM & MAG HYDROXIDE-SIMETH 200-200-20 MG/5ML PO SUSP: 15.0000 mL | ORAL | Status: DC | PRN

## 2018-07-07 MED ORDER — LIDOCAINE 5 % EX PTCH
1.0000 | MEDICATED_PATCH | CUTANEOUS | Status: DC
  Administered 2018-07-08 – 2018-08-05 (×23): 1 via TRANSDERMAL
  Filled 2018-07-07 (×29): qty 1

## 2018-07-07 MED ORDER — ACETAMINOPHEN 650 MG RE SUPP: 650.0000 mg | Freq: Four times a day (QID) | RECTAL | Status: DC | PRN

## 2018-07-07 MED ORDER — VANCOMYCIN HCL IN DEXTROSE 1-5 GM/200ML-% IV SOLN: 1000.0000 mg | Freq: Once | INTRAVENOUS | Status: DC

## 2018-07-07 MED ORDER — PRO-STAT SUGAR FREE PO LIQD
30.0000 mL | Freq: Two times a day (BID) | ORAL | Status: DC
  Administered 2018-07-08 – 2018-07-15 (×4): 30 mL via ORAL
  Filled 2018-07-07 (×14): qty 30

## 2018-07-07 MED ORDER — ACETAMINOPHEN 325 MG PO TABS
650.0000 mg | ORAL_TABLET | Freq: Four times a day (QID) | ORAL | Status: DC | PRN
  Administered 2018-07-09 – 2018-07-13 (×2): 650 mg via ORAL
  Filled 2018-07-07 (×2): qty 2

## 2018-07-07 MED ORDER — SODIUM CHLORIDE 0.9 % IV SOLN
INTRAVENOUS | Status: AC
  Administered 2018-07-08 (×2): via INTRAVENOUS

## 2018-07-07 MED ORDER — ONDANSETRON HCL 4 MG PO TABS
4.0000 mg | ORAL_TABLET | ORAL | Status: DC | PRN
  Administered 2018-07-10: 4 mg via ORAL
  Filled 2018-07-07: qty 1

## 2018-07-07 MED ORDER — SODIUM CHLORIDE 0.9 % IV SOLN
1000.0000 mL | INTRAVENOUS | Status: DC
  Administered 2018-07-07: 21:00:00 1000 mL via INTRAVENOUS

## 2018-07-07 MED ORDER — POTASSIUM CHLORIDE CRYS ER 10 MEQ PO TBCR
10.0000 meq | EXTENDED_RELEASE_TABLET | Freq: Every day | ORAL | Status: DC
  Administered 2018-07-08 – 2018-07-10 (×3): 10 meq via ORAL
  Filled 2018-07-07 (×9): qty 1

## 2018-07-07 MED ORDER — VANCOMYCIN HCL 10 G IV SOLR: 1500.0000 mg | Freq: Once | INTRAVENOUS | Status: DC

## 2018-07-07 MED ORDER — VANCOMYCIN HCL 10 G IV SOLR
1500.0000 mg | Freq: Once | INTRAVENOUS | Status: AC
  Administered 2018-07-07: 1500 mg via INTRAVENOUS
  Filled 2018-07-07 (×2): qty 1500

## 2018-07-07 MED ORDER — SODIUM CHLORIDE 0.9 % IV SOLN
2.0000 g | Freq: Three times a day (TID) | INTRAVENOUS | Status: DC
  Administered 2018-07-08 – 2018-07-13 (×16): 2 g via INTRAVENOUS
  Filled 2018-07-07 (×19): qty 2

## 2018-07-07 MED ORDER — VANCOMYCIN HCL 10 G IV SOLR: 1500.0000 mg | INTRAVENOUS | Status: DC

## 2018-07-07 MED ORDER — DULOXETINE HCL 60 MG PO CPEP
60.0000 mg | ORAL_CAPSULE | Freq: Every day | ORAL | Status: DC
  Administered 2018-07-08 – 2018-07-11 (×4): 60 mg via ORAL
  Filled 2018-07-07 (×4): qty 1

## 2018-07-07 MED ORDER — SODIUM CHLORIDE 0.9 % IV BOLUS (SEPSIS)
1000.0000 mL | Freq: Once | INTRAVENOUS | Status: AC
  Administered 2018-07-07: 21:00:00 1000 mL via INTRAVENOUS

## 2018-07-07 MED ORDER — SODIUM CHLORIDE 0.9 % IV SOLN
2.0000 g | Freq: Once | INTRAVENOUS | Status: AC
  Administered 2018-07-07: 2 g via INTRAVENOUS
  Filled 2018-07-07: qty 2

## 2018-07-07 NOTE — Progress Notes (Signed)
Pharmacy Antibiotic Note  [name] is a 73 y.o. female admitted on 07/07/2018 with pneumonia.  Pharmacy has been consulted for Vancomycin and Cefepime dosing.  Plan: Cefepime 2000 mg IV every 8 hours. Vancomycin 1500 mg IV every 24 hours.  Goal trough 15-20 mcg/mL.  Monitor labs, c/s, and vanco levels as indicated.  Height: 5\' 7"  (170.2 cm) Weight: 165 lb (74.8 kg) IBW/kg (Calculated) : 61.6  Temp (24hrs), Avg:97.6 F (36.4 C), Min:97.6 F (36.4 C), Max:97.6 F (36.4 C)  Recent Labs  Lab 07/07/18 1845 07/07/18 2002  WBC 27.4*  --   CREATININE 0.76  --   LATICACIDVEN  --  2.3*    Estimated Creatinine Clearance: 68.1 mL/min (by C-G formula based on SCr of 0.76 mg/dL).    No Known Allergies  Antimicrobials this admission: Cefepime 4/7 >>  Vanco 4/7 >>   Dose adjustments this admission: N/A  Microbiology results: 4/7 BCx: pending   Thank you for allowing pharmacy to be a part of this patient's care.  Tad Moore 07/07/2018 9:30 PM

## 2018-07-07 NOTE — ED Notes (Signed)
CRITICAL VALUE ALERT  Critical Value:  Lactic Acid - 2.3  Date & Time Notied:  07/07/18  2043  Provider Notified: Ivery Quale PA  Orders Received/Actions taken:

## 2018-07-07 NOTE — H&P (Addendum)
History and Physical    SHANEISHA BURKEL AGT:[id number] DOB: 07/03/1945 DOA: 07/07/2018  PCP: Sharion Balloon, [name]   Patient coming from: Home.  I have personally briefly reviewed patient's old medical records in [place]  Chief Complaint: Bilateral leg pain, generalized weakness and fall.  HPI: [name] is a 73 y.o. female with medical history significant of chronic back pain, GERD, hyperlipidemia, hypertension, left knee osteoarthritis, palpitations, PVD h/o Aortofem bypass, s/p RLE thromboembolectomy and R common emoral artery patch angioplasty 05/31/18, history of tobacco use who presents to ER due to fall at home.  She just was discharged from [facility] yesterday.   Pt is very somnolent , and difficult to elicit history from.   ED Course:   urinalysis negative.  CT brain negative for acute process  CXR =>? Possible right lower lung infiltrate Na 132, K 3.4, Bun 12, Creatinine 0.76,  Alb 2.1 Ast 50, Alt 21, Alk phos 150, T. Bili 0.9 Wbc 27.4, Hgb 10.9, Plt 424 INR 1.7 PTT 41 Lactic acid 2.3   Blood culture x2 pending Trop <0.03  Pt was tx with Ns 259m iv bolus due to hypotension in the ED, Bp 87/58 , and bp improved       Review of Systems: As per HPI otherwise 10 point review of systems negative.       Past Medical History:  Diagnosis Date  . Chronic back pain   . GERD (gastroesophageal reflux disease)   . Hypercholesterolemia   . Hyperlipidemia   . Hypertension   . Osteoarthritis of left knee    End-stage  . Palpitations   . [place] disease) ([place]   . Tobacco user          Past Surgical History:  Procedure Laterality Date  . Angiogram-type unspecified  06/13/00   Dr. LKellie Simmering . FEMORAL-POPLITEAL BYPASS GRAFT    . FINGER TENDON REPAIR  1988   Right mallet  . KNEE ARTHROSCOPY  1996   Left  . LUMBAR FUSION  1998   Dr. LLaverta Baltimore    reports that she has been smoking cigarettes. She has  a 52.00 pack-year smoking history. She has never used smokeless tobacco. She reports that she does not drink alcohol or use drugs.       Family History  Problem Relation Age of Onset  . Heart failure Father        CHF  . Hypertension Father   . Heart disease Father   . Hypertension Paternal Aunt   . Hypertension Paternal Uncle   . Hypertension Paternal Grandmother   . Hypertension Paternal Grandfather   . Coronary artery disease Neg Hx        Premature   NKDA  Medications: Lipitor 854mpo qhs Fenofibrate 16090mo qday Amlodipine 43m8m qday Toprol XL 25mg21mqday Eliquis 5mg p49mid Lyrica 200mg p41md Zanaflex 4mg po 94m Potassium Chloride 43meq po49my Nicotine patch 21mg topi48my qday Lidoderm patch Duloxetine 60mg po qd36m Physical Exam:  T97.6, P 25-106, currently 90, R 19,  Bp 107/59  Pox 99% on 2 L [place] Wt 74.8 kg   Constitutional: NAD, calm, comfortable Eyes: PERRL, lids and conjunctivae normal ENMT: Mucous membranes are moist. Posterior pharynx clear of any exudate or lesions. Neck: normal, supple, no masses, no thyromegaly Respiratory: slight crackles right lung base,  no wheezing  Normal respiratory effort. No accessory muscle use.  Cardiovascular: Regular rate and rhythm,  no murmurs / rubs / gallops. No extremity edema.  Unable to palpate pedal pulses. No carotid bruits.  Abdomen: Soft, NT , no masses palpated. No hepatosplenomegaly. Bowel sounds positive.  Musculoskeletal: no clubbing / cyanosis. Good ROM, no contractures. Normal muscle tone.  Skin: toes black on the left foot 1st and 2nd dry gangrene Neurologic: CN 2-12 grossly intact. Sensation intact, DTR normal. Strength 5/5 in all 4.  Psychiatric: Normal judgment and insight. Alert and oriented x 1. Normal mood.   Labs on Admission: I have personally reviewed following labs and imaging studies  CXR portable IMPRESSION: Atelectasis medial right base. Early pneumonia in this area  cannot be excluded. Lungs elsewhere clear. Stable cardiac silhouette.      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 pneumonia Blood culture x2 pending Start Vanco/ Cefepime pharmacy to dose  Hypotension HOLD BP medications HOLD Lyrica HOLD Zanaflex Hydrate with ns at 127m per hour   Active Problems: Peripheral vascular disease (HCC) Dry Gangrene Cont Eliquis Please follow up with vascular upon discharge  Hyperlipidemia with target LDL less than 100 Continue fenofibrate 160 mg p.o. daily. Continue atorvastatin 40 mg p.o. daily  TOBACCO ABUSE Smoking cessation advised. Cont Nicotine replacement therapy   COPD Supplemental oxygen as needed. Start DuoNeb every 4 hours as needed.  Essential hypertension STOP Amlodipine 172mpo qday STOP Toprol XL 2551mo qday  GERD Maalox prn  Depression Continue Cymbalta 60 mg p.o. daily.  Fall PT to evaluate   Abnormal liver function  Check RUQ ultrasound Consider acute hepatitis panel    DVT prophylaxis: continue Eliquis Code Status: Full code. Family Communication:  Disposition Plan: SNF vs home  Consults called:   None  Admission status: inpatient/stepdown Pt has very severe medical condition from sepsis and hypotension ? Due to Hcap, pt will require more than 2 nites stay and inpatient status  JamJani GravelD. [facility] [phone]/10/18  2359

## 2018-07-07 NOTE — ED Triage Notes (Signed)
Pt from home. Per EMS, fell earlier today and was on the floor approx 4 hrs before EMS arrived. Pt's neighbor found her. Pt c/o back pain, but pain is chronic. Pt stated she did hit her head when she fell, but no LOC. EMS reported pt was very orthostatic and afib on their monitor. Pt given 500 cc PTA.

## 2018-07-07 NOTE — ED Provider Notes (Signed)
[name] EMERGENCY DEPARTMENT Provider Note   CSN: [id number] Arrival date & time: 07/07/18  1814    History   Chief Complaint Chief Complaint  Patient presents with  . Fall    HPI [name] is a 73 y.o. female.     Patient is a 73 year old female who presents to the emergency department following a fall.  Patient has a history of chronic obstructive pulmonary disease, generalized weakness, chronic back pain, hypertension, peripheral vascular disease.  The patient was brought to the emergency department by EMS after a friend went to check on her and found her on the floor.  According to EMS the friend and the patient report that the patient had been on the floor for nearly 4 hours.  It is of note that the patient [facility] on yesterday April 6.  The patient states she has been feeling generally weak recently.  She says that she lost her balance and fell, and could not get back up at the time.  She denies loss of consciousness.  The paramedics report that the patient was quite orthostatic.  She was noted to be in atrial fibrillation on the cardiac monitor for them.  She also noted to have a drop in her blood pressure with change of position.  The patient was given 500 mL of IV fluid in route to the emergency department.  The patient states that she hit her head.  Review of the current medications suggest that the patient may be on Eliquis 5 mg.  The patient denies any other injury.  She denies any difficulty with her breathing at this time.  No abdominal pain, no rib pain, no hip or pelvis pain, and no extremity pain.  The history is provided by the patient and the EMS personnel.    Past Medical History:  Diagnosis Date  . Chronic back pain   . GERD (gastroesophageal reflux disease)   . Hypercholesterolemia   . Hyperlipidemia   . Hypertension   . Osteoarthritis of left knee    End-stage  . Palpitations   . PVD  (peripheral vascular disease) (HCC)   . Tobacco user     Patient Active Problem List   Diagnosis Date Noted  . Contusion of right hip   . COPD with acute exacerbation (HCC)   . General weakness 05/28/2018  . Fall 05/28/2018  . UTI (urinary tract infection) 05/28/2018  . Pancreatitis 05/28/2018  . Lumbar radiculopathy 07/30/2016  . Depression 07/24/2015  . S/P lumbar fusion 01/27/2015  . GAD (generalized anxiety disorder) 06/13/2014  . Neck pain 07/14/2012  . S/P lumbar spinal fusion 07/14/2012  . Lumbar stenosis with neurogenic claudication 06/11/2012  . DDD (degenerative disc disease), lumbosacral 04/24/2012  . Spondylolisthesis at L5-S1 level 04/24/2012  . Hyperlipidemia with target LDL less than 100 06/16/2009  . TOBACCO ABUSE 06/16/2009  . Essential hypertension 06/16/2009  . Peripheral vascular disease (HCC) 06/16/2009  . GERD 06/16/2009  . Chronic back pain 06/16/2009  . CAROTID BRUIT 05/23/2009    Past Surgical History:  Procedure Laterality Date  . Angiogram-type unspecified  06/13/00   Dr. Hart RochesterLawson  . FEMORAL-POPLITEAL BYPASS GRAFT    . FINGER TENDON REPAIR  1988   Right mallet  . KNEE ARTHROSCOPY  1996   Left  . LUMBAR FUSION  1998   Dr. Jacqulyn BathLong  . PATCH ANGIOPLASTY Right 05/31/2018   Procedure: PATCH ANGIOPLASTY RIGHT [facility];  Surgeon:  Maeola Harman, MD;  Location: [facility] OR;  Service: Vascular;  Laterality: Right;  . THROMBECTOMY FEMORAL ARTERY Right 05/31/2018   Procedure: RIGHT FEMORAL THROMBECTOMY;  Surgeon: Maeola Harman, MD;  Location: [facility] OR;  Service: Vascular;  Laterality: Right;     OB History   No obstetric history on file.      Home Medications    Prior to Admission medications   Medication Sig Start Date End Date Taking? Authorizing Provider  alum & mag hydroxide-simeth (MAALOX/MYLANTA) 200-200-20 MG/5ML suspension Take 15-30 mLs by mouth every 2 (two) hours as needed for indigestion. 06/08/18   Delano Metz, MD  amLODipine (NORVASC) 10 MG tablet Take 1 tablet (10 mg total) by mouth daily. 06/08/18   Delano Metz, MD  apixaban (ELIQUIS) 5 MG TABS tablet Take 1 tablet (5 mg total) by mouth 2 (two) times daily. 06/08/18   Delano Metz, MD  atorvastatin (LIPITOR) 40 MG tablet Take 1 tablet (40 mg total) by mouth daily. 10/23/17   Daphine Deutscher Mary-Margaret, [name]  DULoxetine (CYMBALTA) 60 MG capsule Take 1 capsule (60 mg total) by mouth daily. 10/23/17   Daphine Deutscher Mary-Margaret, [name]  fenofibrate 160 MG tablet TAKE 1 TABLET BY MOUTH ONCE DAILY 11/25/17   Daphine Deutscher, Mary-Margaret, [name]  lidocaine (LIDODERM) 5 % Place 1 patch onto the skin daily. Remove & Discard patch within 12 hours or as directed by MD 06/08/18   Delano Metz, MD  metoprolol tartrate (LOPRESSOR) 25 MG tablet Take 0.5 tablets (12.5 mg total) by mouth 2 (two) times daily. 06/08/18   Delano Metz, MD  nicotine (NICODERM CQ - DOSED IN MG/24 HOURS) 21 mg/24hr patch Place 1 patch (21 mg total) onto the skin daily. 06/08/18   Delano Metz, MD  omeprazole (PRILOSEC) 20 MG capsule Take 1 capsule (20 mg total) by mouth daily. 03/05/18   Jannifer Rodney A, [name]  pregabalin (LYRICA) 200 MG capsule Take 1 capsule by mouth 3 (three) times daily. 05/20/18   [provider]  senna-docusate (SENOKOT-S) 8.6-50 MG tablet Take 2 tablets by mouth at bedtime as needed for mild constipation or moderate constipation. 06/08/18   Delano Metz, MD  tiZANidine (ZANAFLEX) 4 MG tablet Take 1-2 tablets (4-8 mg total) by mouth every 8 (eight) hours as needed for muscle spasms. 09/02/16   Bennie Pierini, [name]    Family History Family History  Problem Relation Age of Onset  . Heart failure Father        CHF  . Hypertension Father   . Heart disease Father   . Hypertension Paternal Aunt   . Hypertension Paternal Uncle   . Hypertension Paternal Grandmother   . Hypertension Paternal Grandfather   . Coronary artery disease Neg Hx        Premature    Social  History Social History   Tobacco Use  . Smoking status: Current Every Day Smoker    Packs/day: 1.00    Years: 52.00    Pack years: 52.00    Types: Cigarettes  . Smokeless tobacco: Never Used  Substance Use Topics  . Alcohol use: No    Comment: Occasionally  . Drug use: No     Allergies   Patient has no known allergies.   Review of Systems Review of Systems  Constitutional: Positive for appetite change. Negative for activity change.       All ROS Neg except as noted in HPI  HENT: Negative for nosebleeds.   Eyes: Negative for photophobia and discharge.  Respiratory: Negative  for cough, shortness of breath and wheezing.   Cardiovascular: Negative for chest pain and palpitations.  Gastrointestinal: Negative for abdominal pain and blood in stool.  Genitourinary: Negative for dysuria, frequency and hematuria.  Musculoskeletal: Positive for arthralgias and back pain. Negative for neck pain.  Skin: Negative.   Neurological: Positive for weakness. Negative for dizziness, seizures and speech difficulty.  Psychiatric/Behavioral: Negative for confusion and hallucinations.     Physical Exam Updated Vital Signs BP 104/64   Pulse (!) 106   Temp 97.6 F (36.4 C) (Oral)   Resp (!) 26   Ht 5\' 7"  (1.702 m)   Wt 74.8 kg   SpO2 100%   BMI 25.84 kg/m   Physical Exam Vitals signs and nursing note reviewed.  Constitutional:      Appearance: She is well-developed. She is not toxic-appearing.  HENT:     Head: Normocephalic.      Right Ear: Tympanic membrane and external ear normal.     Left Ear: Tympanic membrane and external ear normal.     Mouth/Throat:     Comments: Tongue and mucous membranes in the mouth are dry. Eyes:     General: Lids are normal.     Pupils: Pupils are equal, round, and reactive to light.  Neck:     Musculoskeletal: Normal range of motion and neck supple.     Vascular: No carotid bruit.  Cardiovascular:     Rate and Rhythm: Normal rate. Rhythm  irregularly irregular.     Pulses: Normal pulses.     Heart sounds: Normal heart sounds. No friction rub.  Pulmonary:     Effort: No respiratory distress.     Breath sounds: Normal breath sounds.     Comments: Mild tachypnea 24 respirations per minute there is symmetrical rise and fall of the chest. No bruising over the rib areas. Chest:     Comments: No bruising of the chest.  Chaperone present during the examination.  No tenderness over the right or left rib areas. Abdominal:     General: Bowel sounds are normal.     Palpations: Abdomen is soft.     Tenderness: There is no abdominal tenderness. There is no guarding.  Musculoskeletal:     Comments: There is good range of motion of the upper extremities.  No deformity of the upper extremities.  There is no pain with rocking of the pelvis.  No shortening of the lower extremities.  No deformity appreciated.  The right big toe and second toe are black.  There is capillary refill of the third fourth and fifth toe on the right.  Dorsalis pedis pulses difficult to palpate.  Lymphadenopathy:     Head:     Right side of head: No submandibular adenopathy.     Left side of head: No submandibular adenopathy.     Cervical: No cervical adenopathy.  Skin:    General: Skin is dry.     Coloration: Skin is pale.  Neurological:     Mental Status: She is oriented to person, place, and time. She is lethargic.     Cranial Nerves: No cranial nerve deficit.     Sensory: No sensory deficit.  Psychiatric:        Speech: Speech normal.      ED Treatments / Results  Labs (all labs ordered are listed, but only abnormal results are displayed) Labs Reviewed  COMPREHENSIVE METABOLIC PANEL - Abnormal; Notable for the following components:      Result Value  Sodium 132 (*)    Potassium 3.4 (*)    Glucose, Bld 166 (*)    Calcium 8.3 (*)    Total Protein 6.3 (*)    Albumin 2.1 (*)    AST 50 (*)    Alkaline Phosphatase 150 (*)    All other components  within normal limits  URINALYSIS, ROUTINE W REFLEX MICROSCOPIC - Abnormal; Notable for the following components:   Color, Urine AMBER (*)    APPearance HAZY (*)    Glucose, UA 50 (*)    All other components within normal limits  CBC WITH DIFFERENTIAL/PLATELET - Abnormal; Notable for the following components:   WBC 27.4 (*)    Hemoglobin 10.9 (*)    HCT 35.7 (*)    MCH 25.8 (*)    RDW 18.0 (*)    Platelets 424 (*)    nRBC 0.5 (*)    Neutro Abs 22.6 (*)    Monocytes Absolute 2.9 (*)    Abs Immature Granulocytes 0.23 (*)    All other components within normal limits  CK - Abnormal; Notable for the following components:   Total CK 30 (*)    All other components within normal limits  CULTURE, BLOOD (ROUTINE X 2)  CULTURE, BLOOD (ROUTINE X 2)  TROPONIN I  PROTIME-INR  APTT  LACTIC ACID, PLASMA    EKG EKG Interpretation  Date/Time:  Tuesday July 07 2018 18:26:15 EDT Ventricular Rate:  85 PR Interval:    QRS Duration: 83 QT Interval:  373 QTC Calculation: 444 R Axis:   70 Text Interpretation:  Atrial fibrillation Confirmed by Vanetta Mulders [phone]) on 07/07/2018 7:03:27 PM   Radiology No results found.  Procedures Procedures (including critical care time) CRITICAL CARE Performed by: Ivery Quale Total critical care time: **30* minutes Critical care time was exclusive of separately billable procedures and treating other patients. Critical care was necessary to treat or prevent imminent or life-threatening deterioration. Critical care was time spent personally by me on the following activities: development of treatment plan with patient and/or surrogate as well as nursing, discussions with consultants, evaluation of patient's response to treatment, examination of patient, obtaining history from patient or surrogate, ordering and performing treatments and interventions, ordering and review of laboratory studies, ordering and review of radiographic studies, pulse oximetry and  re-evaluation of patient's condition.  Medications Ordered in ED Medications  sodium chloride 0.9 % bolus 1,000 mL (1,000 mLs Intravenous New Bag/Given 07/07/18 1917)    Followed by  0.9 %  sodium chloride infusion (has no administration in time range)     Initial Impression / Assessment and Plan / ED Course  I have reviewed the triage vital signs and the nursing notes.  Pertinent labs & imaging results that were available during my care of the patient were reviewed by me and considered in my medical decision making (see chart for details).          Final Clinical Impressions(s) / ED Diagnoses MDM Pulse rate is 87-106.  Patient in an irregularly irregular rhythm on the monitor.  Respirations are 26.  Patient is awake.  Answers my questions.  Patient was brought to the emergency department by EMS from home.  She states she was recently discharged from nursing facility.  She had a fall today.  And was possibly on the floor for 4 hours.  Patient had a low blood pressure by EMS and was given 500 mL of saline.  When attempting to sit up on side of the  bed, the blood pressure dropped to systolic of 87.  IV fluid bolus started on the patient.  7:24pm recheck.  Patient more lethargic than my initial evaluation.  She is much less talkative, and speech is difficult to understand.  Will obtain CBG, and stat CT head.  We will also check a chest x-ray.  Recheck.  Blood pressure only minimally improved.  Patient is more awake and alert.   CBG is 150.  CT head scan is negative for acute bleed or other acute changes.  Complete blood count shows the white blood cells to be elevated at 27.4, the hemoglobin is 10.9, and the hematocrit is low at 35.7.  Review of the patient's previous records shows that she consistently has significant elevation of her white blood cell counts.  There is no shift to the left. PT is elevated at 19.9 seconds with an INR of 1.7.  PTT was 41. Comprehensive metabolic panel  shows the sodium to be slightly low at 132, the potassium is low at 3.4, the glucose is elevated at 166.  The protein and albumin are both low.  The alkaline phosphatase is elevated at 150.  The anion gap is normal at 10.  Troponin is negative for acute event at 0.03.  CK is low at 30.  Urine analysis shows a hazy amber specimen with a specific gravity 1.014.  Glucose was 50, otherwise the urine analysis was normal. The portable chest x-ray shows atelectasis at the medial right base.  An early pneumonia could not be ruled out.  Pt seen with me by Dr Deretha Emory.  The heart rate is now 87 to 96.  A critical lactic acid was noted on this patient at 2.3.  Code sepsis was initiated by Dr. Deretha Emory.  IV fluids are in process.  Recheck.  Patient tolerating IV antibiotics without problem.  Patient tolerating IV fluids without problem.  Blood pressures now stable at 118.  Pulse oximetry is within normal limits at 98 to 99% on room air.  Case discussed with Dr. Bertis Ruddy.  He will admit the patient.    Final diagnoses:  Fall, initial encounter  Severe sepsis with septic shock (CODE) [facility])    ED Discharge Orders    None       Ivery Quale, PA-C 07/08/18 0002    Vanetta Mulders, MD 07/08/18 [phone]

## 2018-07-07 NOTE — ED Provider Notes (Signed)
Medical screening examination/treatment/procedure(s) were conducted as a shared visit with non-physician practitioner(s) and myself.  I personally evaluated the patient during the encounter.  EKG Interpretation  Date/Time:  Tuesday July 07 2018 18:26:15 EDT Ventricular Rate:  85 PR Interval:    QRS Duration: 83 QT Interval:  373 QTC Calculation: 444 R Axis:   70 Text Interpretation:  Atrial fibrillation Confirmed by Vanetta Mulders [phone]) on 07/07/2018 7:03:27 PM   Patient seen by me along with physician assistant.  Patient was discharged from [facility] for rehab yesterday.  Patient at home by herself a friend found her on the floor.  At best we can decipher patient on the floor for several hours may be approximately 4 hours.  Work-up here patient was some transient hypotension normal fever a little tachycardic initially patient eventually got sepsis work-up had elevated lactic acid of 2.3.  Chest x-ray suggestive of a pneumonia more of a focal pneumonia not multifocal.  Patient started on broad-spectrum antibiotics for a hospital-acquired pneumonia.  Patient did receive full fluid challenge blood pressures now are systolic of 118.  Tachycardia is resolved.  Oxygen sats are good at 99%.  Patient without any cough.  Work-up without any other findings.  Patient will require admission by the hospitalist service.  In addition patient's head CT was negative.   Vanetta Mulders, MD 07/07/18 2218

## 2018-07-07 NOTE — ED Notes (Signed)
Patient's daughter called for update on patient. States patient has been at [facility] for rehab and was released yesterday to home. Daughter's name Laurann Montana, number [phone] to call if needed.

## 2018-07-07 NOTE — ED Notes (Signed)
Patient lying in bed sleeping at this time. Responds to verbal stimuli. Ivery Quale PA at bedside.

## 2018-07-08 ENCOUNTER — Inpatient Hospital Stay (HOSPITAL_COMMUNITY): Payer: Medicare Other

## 2018-07-08 ENCOUNTER — Ambulatory Visit: Payer: Medicare Other | Admitting: Family

## 2018-07-08 ENCOUNTER — Encounter (HOSPITAL_COMMUNITY): Payer: Self-pay | Admitting: *Deleted

## 2018-07-08 DIAGNOSIS — R945 Abnormal results of liver function studies: Secondary | ICD-10-CM

## 2018-07-08 DIAGNOSIS — R634 Abnormal weight loss: Secondary | ICD-10-CM

## 2018-07-08 DIAGNOSIS — R110 Nausea: Secondary | ICD-10-CM

## 2018-07-08 LAB — CBC
HCT: 33 % — ABNORMAL LOW (ref 36.0–46.0)
Hemoglobin: 9.9 g/dL — ABNORMAL LOW (ref 12.0–15.0)
MCH: 25.9 pg — ABNORMAL LOW (ref 26.0–34.0)
MCHC: 30 g/dL (ref 30.0–36.0)
MCV: 86.4 fL (ref 80.0–100.0)
Platelets: 376 10*3/uL (ref 150–400)
RBC: 3.82 MIL/uL — ABNORMAL LOW (ref 3.87–5.11)
RDW: 18.3 % — ABNORMAL HIGH (ref 11.5–15.5)
WBC: 26.6 10*3/uL — ABNORMAL HIGH (ref 4.0–10.5)
nRBC: 0.2 % (ref 0.0–0.2)

## 2018-07-08 LAB — COMPREHENSIVE METABOLIC PANEL
ALT: 18 U/L (ref 0–44)
AST: 41 U/L (ref 15–41)
Albumin: 1.9 g/dL — ABNORMAL LOW (ref 3.5–5.0)
Alkaline Phosphatase: 131 U/L — ABNORMAL HIGH (ref 38–126)
Anion gap: 7 (ref 5–15)
BUN: 10 mg/dL (ref 8–23)
CO2: 22 mmol/L (ref 22–32)
Calcium: 8.1 mg/dL — ABNORMAL LOW (ref 8.9–10.3)
Chloride: 109 mmol/L (ref 98–111)
Creatinine, Ser: 0.48 mg/dL (ref 0.44–1.00)
GFR calc Af Amer: >60 mL/min (ref >60)
GFR calc non Af Amer: >60 mL/min (ref >60)
Glucose, Bld: 130 mg/dL — ABNORMAL HIGH (ref 70–99)
Potassium: 3.8 mmol/L (ref 3.5–5.1)
Sodium: 138 mmol/L (ref 135–145)
Total Bilirubin: 0.8 mg/dL (ref 0.3–1.2)
Total Protein: 5.6 g/dL — ABNORMAL LOW (ref 6.5–8.1)

## 2018-07-08 LAB — RAPID URINE DRUG SCREEN, HOSP PERFORMED
Amphetamines: NOT DETECTED
Barbiturates: NOT DETECTED
Benzodiazepines: NOT DETECTED
Cocaine: NOT DETECTED
Opiates: NOT DETECTED
Tetrahydrocannabinol: NOT DETECTED

## 2018-07-08 LAB — MRSA PCR SCREENING: MRSA by PCR: NEGATIVE

## 2018-07-08 LAB — PROCALCITONIN: Procalcitonin: 0.69 ng/mL

## 2018-07-08 LAB — LIPASE, BLOOD: Lipase: 65 U/L — ABNORMAL HIGH (ref 11–51)

## 2018-07-08 LAB — LACTIC ACID, PLASMA
Lactic Acid, Venous: 1.4 mmol/L (ref 0.5–1.9)
Lactic Acid, Venous: 1.3 mmol/L (ref 0.5–1.9)

## 2018-07-08 LAB — TSH: TSH: 0.938 u[IU]/mL (ref 0.350–4.500)

## 2018-07-08 MED ORDER — IPRATROPIUM-ALBUTEROL 20-100 MCG/ACT IN AERS: 1.0000 | INHALATION_SPRAY | Freq: Four times a day (QID) | RESPIRATORY_TRACT | Status: DC | PRN

## 2018-07-08 MED ORDER — METOPROLOL TARTRATE 25 MG PO TABS
12.5000 mg | ORAL_TABLET | Freq: Two times a day (BID) | ORAL | Status: DC
  Administered 2018-07-08 (×2): 12.5 mg via ORAL
  Filled 2018-07-08 (×2): qty 1

## 2018-07-08 MED ORDER — IPRATROPIUM-ALBUTEROL 0.5-2.5 (3) MG/3ML IN SOLN: 3.0000 mL | Freq: Four times a day (QID) | RESPIRATORY_TRACT | Status: DC | PRN

## 2018-07-08 MED ORDER — PREGABALIN 100 MG PO CAPS
200.0000 mg | ORAL_CAPSULE | Freq: Three times a day (TID) | ORAL | Status: DC
  Administered 2018-07-08 – 2018-07-11 (×10): 200 mg via ORAL
  Filled 2018-07-08 (×10): qty 2

## 2018-07-08 MED ORDER — PANTOPRAZOLE SODIUM 40 MG PO TBEC
40.0000 mg | DELAYED_RELEASE_TABLET | Freq: Every day | ORAL | Status: DC
  Administered 2018-07-08 – 2018-07-14 (×7): 40 mg via ORAL
  Filled 2018-07-08 (×8): qty 1

## 2018-07-08 MED ORDER — TIZANIDINE HCL 2 MG PO TABS
4.0000 mg | ORAL_TABLET | Freq: Three times a day (TID) | ORAL | Status: DC | PRN
  Administered 2018-07-11: 4 mg via ORAL
  Filled 2018-07-08 (×2): qty 4

## 2018-07-08 NOTE — ED Notes (Signed)
ED TO INPATIENT HANDOFF REPORT  ED Nurse Name and Phone #: Josph Macho RN [phone]  S Name/Age/Gender [name]  73 y.o. female Room/Bed: APA18/APA18  Code Status   Code Status: Full Code  Home/SNF/Other Home Patient oriented to: self, place, time and situation Is this baseline? Yes   Triage Complete: Triage complete  Chief Complaint Fall   Triage Note Pt from home. Per EMS, fell earlier today and was on the floor approx 4 hrs before EMS arrived. Pt's neighbor found her. Pt c/o back pain, but pain is chronic. Pt stated she did hit her head when she fell, but no LOC. EMS reported pt was very orthostatic and afib on their monitor. Pt given 500 cc PTA.    Allergies No Known Allergies  Level of Care/Admitting Diagnosis ED Disposition    ED Disposition Condition Comment   Admit  Hospital Area: [facility] [[id number]]  Level of Care: Stepdown [14]  Diagnosis: Sepsis [facility]) [[id number]]  Admitting Physician: Pearson Grippe [[id number]]  Attending Physician: Pearson Grippe [phone]  Estimated length of stay: past midnight tomorrow  Certification:: I certify this patient will need inpatient services for at least 2 midnights  PT Class (Do Not Modify): Inpatient [101]  PT Acc Code (Do Not Modify): Private [1]       B Medical/Surgery History Past Medical History:  Diagnosis Date  . Chronic back pain   . GERD (gastroesophageal reflux disease)   . Hypercholesterolemia   . Hyperlipidemia   . Hypertension   . Osteoarthritis of left knee    End-stage  . Palpitations   . PVD (peripheral vascular disease) (HCC)   . Tobacco user    Past Surgical History:  Procedure Laterality Date  . Angiogram-type unspecified  06/13/00   Dr. Hart Rochester  . FEMORAL-POPLITEAL BYPASS GRAFT    . FINGER TENDON REPAIR  1988   Right mallet  . KNEE ARTHROSCOPY  1996   Left  . LUMBAR FUSION  1998   Dr. Jacqulyn Bath  . PATCH ANGIOPLASTY Right 05/31/2018   Procedure: PATCH ANGIOPLASTY RIGHT FEMORAL ARTERY WITH DECRON  PATCH;  Surgeon: Maeola Harman, MD;  [facility] OR;  Service: Vascular;  Laterality: Right;  . THROMBECTOMY FEMORAL ARTERY Right 05/31/2018   Procedure: RIGHT FEMORAL THROMBECTOMY;  Surgeon: Maeola Harman, MD;  Location: [facility] OR;  Service: Vascular;  Laterality: Right;     A IV Location/Drains/Wounds Patient Lines/Drains/Airways Status   Active Line/Drains/Airways    Name:   Placement date:   Placement time:   Site:   Days:   Peripheral IV 07/07/18 Left Antecubital   07/07/18    1822    Antecubital   1   Peripheral IV 07/07/18 Right Antecubital   07/07/18    2120    Antecubital   1   Incision (Closed) 05/31/18 Groin Right   05/31/18    1039     38          Intake/Output Last 24 hours  Intake/Output Summary (Last 24 hours) at 07/08/2018 0021 Last data filed at 07/07/2018 9147 Gross per 24 hour  Intake 500 ml  Output -  Net 500 ml    Labs/Imaging Results for orders placed or performed during the hospital encounter of 07/07/18 (from the past 48 hour(s))  Urinalysis, Routine w reflex microscopic     Status: Abnormal   Collection Time: 07/07/18  6:26 PM  Result Value Ref Range   Color, Urine AMBER (A) YELLOW    Comment: BIOCHEMICALS  MAY BE AFFECTED BY COLOR   APPearance HAZY (A) CLEAR   Specific Gravity, Urine 1.014 1.005 - 1.030   pH 5.0 5.0 - 8.0   Glucose, UA 50 (A) NEGATIVE mg/dL   Hgb urine dipstick NEGATIVE NEGATIVE   Bilirubin Urine NEGATIVE NEGATIVE   Ketones, ur NEGATIVE NEGATIVE mg/dL   Protein, ur NEGATIVE NEGATIVE mg/dL   Nitrite NEGATIVE NEGATIVE   Leukocytes,Ua NEGATIVE NEGATIVE    Comment: Performed at [facility], [address] [place], [place] [address]  Comprehensive metabolic panel     Status: Abnormal   Collection Time: 07/07/18  6:45 PM  Result Value Ref Range   Sodium 132 (L) 135 - 145 mmol/L   Potassium 3.4 (L) 3.5 - 5.1 mmol/L   Chloride 100 98 - 111 mmol/L   CO2 22 22 - 32 mmol/L   Glucose, Bld 166 (H) 70 - 99 mg/dL   BUN  12 8 - 23 mg/dL   Creatinine, Ser 6.04 0.44 - 1.00 mg/dL   Calcium 8.3 (L) 8.9 - 10.3 mg/dL   Total Protein 6.3 (L) 6.5 - 8.1 g/dL   Albumin 2.1 (L) 3.5 - 5.0 g/dL   AST 50 (H) 15 - 41 U/L   ALT 21 0 - 44 U/L   Alkaline Phosphatase 150 (H) 38 - 126 U/L   Total Bilirubin 0.9 0.3 - 1.2 mg/dL   GFR calc non Af Amer >60 >60 mL/min   GFR calc Af Amer >60 >60 mL/min   Anion gap 10 5 - 15    Comment: Performed at [facility], [address] [place], [place] [address]  Troponin I - Once     Status: None   Collection Time: 07/07/18  6:45 PM  Result Value Ref Range   Troponin I <0.03 <0.03 ng/mL    Comment: Performed at [facility], [address], [place], [place] [address]  CBC with Differential     Status: Abnormal   Collection Time: 07/07/18  6:45 PM  Result Value Ref Range   WBC 27.4 (H) 4.0 - 10.5 K/uL   RBC 4.22 3.87 - 5.11 MIL/uL   Hemoglobin 10.9 (L) 12.0 - 15.0 g/dL   HCT 78.2 (L) 95.6 - 21.3 %   MCV 84.6 80.0 - 100.0 fL   MCH 25.8 (L) 26.0 - 34.0 pg   MCHC 30.5 30.0 - 36.0 g/dL   RDW 08.6 (H) 57.8 - 46.9 %   Platelets 424 (H) 150 - 400 K/uL   nRBC 0.5 (H) 0.0 - 0.2 %   Neutrophils Relative % 82 %   Neutro Abs 22.6 (H) 1.7 - 7.7 K/uL   Lymphocytes Relative 6 %   Lymphs Abs 1.6 0.7 - 4.0 K/uL   Monocytes Relative 11 %   Monocytes Absolute 2.9 (H) 0.1 - 1.0 K/uL   Eosinophils Relative 0 %   Eosinophils Absolute 0.1 0.0 - 0.5 K/uL   Basophils Relative 0 %   Basophils Absolute 0.1 0.0 - 0.1 K/uL   Immature Granulocytes 1 %   Abs Immature Granulocytes 0.23 (H) 0.00 - 0.07 K/uL    Comment: Performed at [facility], [address], [place], [place] [address]  CK     Status: Abnormal   Collection Time: 07/07/18  6:45 PM  Result Value Ref Range   Total CK 30 (L) 38 - 234 U/L    Comment: Performed at [facility], [address] [place], [place] [address]  Protime-INR     Status: Abnormal  Collection Time: 07/07/18  6:46 PM  Result Value Ref Range   Prothrombin Time 19.9  (H) 11.4 - 15.2 seconds   INR 1.7 (H) 0.8 - 1.2    Comment: (NOTE) INR goal varies based on device and disease states. Performed at [facility], [address] [place], [place] [address]   APTT     Status: Abnormal   Collection Time: 07/07/18  6:46 PM  Result Value Ref Range   aPTT 41 (H) 24 - 36 seconds    Comment:        IF BASELINE aPTT IS ELEVATED, SUGGEST PATIENT RISK ASSESSMENT BE USED TO DETERMINE APPROPRIATE ANTICOAGULANT THERAPY. Performed at [facility], [address] [place], [place] [address]   POC CBG, ED     Status: Abnormal   Collection Time: 07/07/18  7:51 PM  Result Value Ref Range   Glucose-Capillary 150 (H) 70 - 99 mg/dL  Lactic acid, plasma     Status: Abnormal   Collection Time: 07/07/18  8:02 PM  Result Value Ref Range   Lactic Acid, Venous 2.3 (HH) 0.5 - 1.9 mmol/L    Comment: CRITICAL RESULT CALLED TO, READ BACK BY AND VERIFIED WITH: Keymon Mcelroy, L ON 07/07/18 AT 2040 BY LOY,C Performed at [facility], [address] [place], [place] [address]   Lactic acid, plasma     Status: None   Collection Time: 07/07/18  9:48 PM  Result Value Ref Range   Lactic Acid, Venous 1.6 0.5 - 1.9 mmol/L    Comment: Performed at [facility], [address] [place], [place] [address]   Ct Head Wo Contrast  Result Date: 07/07/2018 CLINICAL DATA:  Fall today.  Head injury. EXAM: CT HEAD WITHOUT CONTRAST TECHNIQUE: Contiguous axial images were obtained from the base of the skull through the vertex without intravenous contrast. COMPARISON:  CT head 05/30/2018 FINDINGS: Brain: Ventricle size normal. Small hypodensity in the corona radiata on the right is unchanged compatible with chronic ischemia. No acute infarct, hemorrhage, or mass lesion. Vascular: Negative for hyperdense vessel Skull: Negative for fracture Sinuses/Orbits: Chronic mucoperiosteal thickening of the left sphenoid sinus. 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   On: 07/07/2018 20:01   Dg Chest Portable 1 View  Result Date: 07/07/2018 CLINICAL DATA:  Pain following fall EXAM: PORTABLE CHEST 1 VIEW COMPARISON:  June 03, 2018 FINDINGS: Atelectatic change in the medial right base noted. Lungs elsewhere clear. Heart size and pulmonary vascularity are normal. No adenopathy. No pneumothorax. No bone lesions. Thoracic stimulator lead tips are in the lower thoracic region. IMPRESSION: Atelectasis medial right base. Early pneumonia in this area cannot be excluded. Lungs elsewhere clear. Stable cardiac silhouette. Electronically Signed   By: Bretta BangWilliam  Woodruff III M.D.   On: 07/07/2018 19:59    Pending Labs Unresulted Labs (From admission, onward)    Start     Ordered   07/08/18 0500  Comprehensive metabolic panel  Tomorrow morning,   R     07/07/18 2335   07/08/18 0500  CBC  Tomorrow morning,   R     07/07/18 2335   07/08/18 0500  TSH  Tomorrow morning,   R     07/07/18 2335   07/07/18 2341  Rapid urine drug screen (hospital performed)  ONCE - STAT,  R     07/07/18 2340   07/07/18 2109  Lactic acid, plasma  Now then every 2 hours,   STAT     07/07/18 2110   07/07/18 1911  Blood culture (routine x 2)  BLOOD CULTURE X 2,   STAT     07/07/18 1912          Vitals/Pain Today's Vitals   07/07/18 2200 07/07/18 2230 07/07/18 2300 07/07/18 2330  BP: 118/60 (!) 102/54 (!) 94/56 (!) 107/59  Pulse: 78 83 (!) 25 90  Resp: 19 (!) 22 (!) 22 19  Temp:      TempSrc:      SpO2: 99% 99% 99% 99%  Weight:      Height:      PainSc:        Isolation Precautions No active isolations  Medications Medications  sodium chloride 0.9 % bolus 1,000 mL (0 mLs Intravenous Stopped 07/07/18 2039)    Followed by  0.9 %  sodium chloride infusion (0 mLs Intravenous Stopped 07/07/18 2127)  sodium chloride 0.9 % bolus 1,000  mL (0 mLs Intravenous Stopped 07/07/18 2225)    And  sodium chloride 0.9 % bolus 1,000 mL (0 mLs Intravenous Stopped 07/07/18 2225)    And  sodium chloride 0.9 % bolus 250 mL (250 mLs Intravenous New Bag/Given 07/08/18 0019)  vancomycin (VANCOCIN) 1,500 mg in sodium chloride 0.9 % 500 mL IVPB (1,500 mg Intravenous New Bag/Given 07/07/18 2224)  ceFEPIme (MAXIPIME) 2 g in sodium chloride 0.9 % 100 mL IVPB (has no administration in time range)  vancomycin (VANCOCIN) 1,500 mg in sodium chloride 0.9 % 500 mL IVPB (has no administration in time range)  0.9 %  sodium chloride infusion (has no administration in time range)  acetaminophen (TYLENOL) tablet 650 mg (has no administration in time range)    Or  acetaminophen (TYLENOL) suppository 650 mg (has no administration in time range)  atorvastatin (LIPITOR) tablet 40 mg (has no administration in time range)  fenofibrate tablet 160 mg (has no administration in time range)  DULoxetine (CYMBALTA) DR capsule 60 mg (has no administration in time range)  nicotine (NICODERM CQ - dosed in mg/24 hours) patch 21 mg (has no administration in time range)  alum & mag hydroxide-simeth (MAALOX/MYLANTA) 200-200-20 MG/5ML suspension 15-30 mL (has no administration in time range)  ondansetron (ZOFRAN) tablet 4 mg (has no administration in time range)  senna-docusate (Senokot-S) tablet 2 tablet (has no administration in time range)  apixaban (ELIQUIS) tablet 5 mg (has no administration in time range)  potassium chloride (K-DUR) CR tablet 10 mEq (has no administration in time range)  lidocaine (LIDODERM) 5 % 1 patch (has no administration in time range)  feeding supplement (PRO-STAT SUGAR FREE 64) liquid 30 mL (has no administration in time range)  ceFEPIme (MAXIPIME) 2 g in sodium chloride 0.9 % 100 mL IVPB (0 g Intravenous Stopped 07/07/18 2203)    Mobility walks with device High fall risk   Focused Assessments Neuro Assessment Handoff:  Swallow screen pass? N/A          Neuro Assessment:   Neuro Checks:      Last Documented NIHSS Modified Score:   Has TPA been given? No If patient is a Neuro Trauma and patient is going to OR before floor call report to 4N Charge nurse: [phone] or [phone]     R Recommendations: See Admitting Provider Note  Report given to: Shanda Bumps RN  Additional Notes:

## 2018-07-08 NOTE — TOC Initial Note (Signed)
Transition of Care [facility](TOC) - Initial/Assessment Note    Patient Details  Name: [name] MRN: [id number] Date of Birth: 03/14/1946  Transition of Care [facility](TOC) CM/SW Contact:    Elliot GaultKathleen Brookelynn Hamor, LCSW Phone Number: 07/08/2018, 1:52 PM  Clinical Narrative:                 [facility] and was only home for one day before presenting to the ED after falling at home. PT eval is ordered. Spoke with pt's daughter, [name], by phone today. Per [name], if pt needs SNF at dc again, they would likely want [facility]. [name] states family hopeful pt can [facility]. [facility] at [facility] of dc [facility]. Spoke with Clydie BraunKaren at [facility] this AM to update.   TOC will follow and assist with SNF vs. HH at dc.  Expected Discharge Plan: Home w Home Health Services Barriers to Discharge: Continued Medical Work up   Patient Goals and CMS Choice        Expected Discharge Plan and Services Expected Discharge Plan: Home w Home Health Services       Living arrangements for the past 2 months: Skilled Nursing Facility, Single Family Home                       HH [facility]  Prior Living Arrangements/Services Living arrangements for the past 2 months: Skilled Nursing Facility, Single Family Home Lives with:: Self Patient language and need for interpreter reviewed:: Yes Do you feel safe going back to the place where you live?: Yes        Care giver support system in place?: Yes (comment) Current home services: Home RN, Home PT Criminal Activity/Legal Involvement Pertinent to Current Situation/Hospitalization: No - Comment as needed  Activities of Daily Living Home Assistive Devices/Equipment: Environmental consultantWalker (specify type), Blood pressure cuff, Dentures (specify type) ADL Screening (condition at time of admission) Patient's cognitive ability adequate to safely complete daily activities?: Yes Is the patient deaf  or have difficulty hearing?: No Does the patient have difficulty seeing, even when wearing glasses/contacts?: No Does the patient have difficulty concentrating, remembering, or making decisions?: No Patient able to express need for assistance with ADLs?: Yes Does the patient have difficulty dressing or bathing?: No Independently performs ADLs?: No Communication: Independent Dressing (OT): Independent Grooming: Independent Feeding: Independent Bathing: Independent Toileting: Independent In/Out Bed: Independent with device (comment) Is this a change from baseline?: Pre-admission baseline Walks in Home: Independent with device (comment) Does the patient have difficulty walking or climbing stairs?: Yes Weakness of Legs: Both Weakness of Arms/Hands: Both  Permission Sought/Granted                  Emotional Assessment       Orientation: : Oriented to Self Alcohol / Substance Use: Not Applicable Psych Involvement: No (comment)  Admission diagnosis:  Abnormal liver function [R94.5] Fall, initial encounter [W19.XXXA] Severe sepsis with septic shock (CODE) (HCC) [R65.21] Patient Active Problem List   Diagnosis Date Noted  . Abnormal liver function   . Sepsis (HCC) 07/07/2018  . Severe protein-calorie malnutrition (HCC) 07/07/2018  . Contusion of right hip   . COPD with acute exacerbation (HCC)   . General weakness 05/28/2018  . Fall 05/28/2018  . UTI (urinary tract infection) 05/28/2018  . Pancreatitis 05/28/2018  . Lumbar radiculopathy 07/30/2016  . Depression 07/24/2015  . S/P  lumbar fusion 01/27/2015  . GAD (generalized anxiety disorder) 06/13/2014  . Neck pain 07/14/2012  . S/P lumbar spinal fusion 07/14/2012  . Lumbar stenosis with neurogenic claudication 06/11/2012  . DDD (degenerative disc disease), lumbosacral 04/24/2012  . Spondylolisthesis at L5-S1 level 04/24/2012  . Hyperlipidemia with target LDL less than 100 06/16/2009  . TOBACCO ABUSE 06/16/2009  .  Essential hypertension 06/16/2009  . Peripheral vascular disease (HCC) 06/16/2009  . GERD 06/16/2009  . Chronic back pain 06/16/2009  . CAROTID BRUIT 05/23/2009   PCP:  Junie Spencer, [name] Pharmacy:   [facility] [address], [place] - [address] [phone] [place] [address] Phone: [phone] Fax: [phone]     Social Determinants of Health (SDOH) Interventions    Readmission Risk Interventions No flowsheet data found.

## 2018-07-08 NOTE — Progress Notes (Addendum)
PROGRESS NOTE    Danielle FilbertLinda S Grant  ZOX:[id number] DOB: 12-05-1945 DOA: 07/07/2018 PCP: [name], [name], [name]   Brief Narrative:  Per HPI: Danielle PicaLinda S Cardwellis a 73 y.o.femalewith medical history significant ofchronic back pain, GERD, hyperlipidemia, hypertension, left knee osteoarthritis, palpitations, PVD h/o Aortofem bypass, s/p RLE thromboembolectomy and R common emoral artery patch angioplasty 05/31/18, history of tobacco use who presents to ER due to fall at home.  [facility]   Pt is very somnolent , and difficult to elicit history from.   Patient was admitted with generalized weakness secondary to suspected sepsis with questionable source of pneumonia.  She was also noted to be hypotensive and blood pressure medications as well as Lyrica and Zanaflex are being withheld.  PT evaluation pending.  Assessment & Plan:   Principal Problem:   Sepsis (HCC) Active Problems:   Hyperlipidemia with target LDL less than 100   Essential hypertension   Peripheral vascular disease (HCC)   Severe protein-calorie malnutrition (HCC)   Abnormal liver function   1. Sepsis secondary to questionable pneumonia.  Continue treatment on cefepime and discontinue vancomycin as MRSA swab is negative.  Anticipate conversion to Augmentin prior to discharge.  She appears to be improving with lactic acid level is downtrending.  We will continue IV fluid for 12 more hours and recheck lactic acid in a.m. 2. Hypotension secondary to above.  Continue to hold amlodipine and restart metoprolol today as well as Zanaflex and Lyrica and assess response.  IV fluid to discontinue in 12 hours. 3. Poor oral intake due to intractable nausea and vomiting and abdominal pain with associated weight loss.  Will try diet today to see how well she tolerates, but after discussion with daughter, this has been quite persistent and patient has lost 40 pounds.  Will consult GI for endoscopy. 4. PVD  with dry gangrene.  Continue on Eliquis and follow-up with vascular on discharge. 5. Dyslipidemia.  Continue on fenofibrate and atorvastatin. 6. COPD with tobacco abuse.  Duo nebs every 4 hours as needed and nicotine patch.  Smoking cessation advised. 7. Essential hypertension.  Restarted on metoprolol and hold amlodipine. 8. GERD.  Maalox as needed. 9. Abnormal LFTs.  Right upper quadrant ultrasound pending.  This has improved, however.  Recheck labs in a.m.  Will need outpatient follow-up as she is on statin. 10. Fall at home with weakness.  PT to evaluate.  May require placement once again and/or home health.   DVT prophylaxis: Eliquis Code Status: Full Family Communication: Spoke with Daughter [name] [phone] Disposition Plan: Continue treatment of pneumonia with cefepime and IV fluid.  PT evaluation pending.  GI consult for evaluation of endoscopy and patient per family request.   Consultants:   None  Procedures:   None  Antimicrobials:   Vancomycin 4/7-4/8  Cefepime 4/7->   Subjective: Patient seen and evaluated today with no new acute complaints or concerns. No acute concerns or events noted overnight.  She is generally more awake and alert and appears to be feeling somewhat better.  She denies any specific complaints.  Objective: Vitals:   07/07/18 2230 07/07/18 2300 07/07/18 2330 07/08/18 0100  BP: (!) 102/54 (!) 94/56 (!) 107/59   Pulse: 83 (!) 25 90   Resp: (!) 22 (!) 22 19   Temp:    (!) 97.4 F (36.3 C)  TempSrc:    Oral  SpO2: 99% 99% 99%   Weight:    66.1 kg  Height:     (1.702 m)    Intake/Output Summary (Last 24 hours) at 07/08/2018 0729 Last data filed at 07/08/2018 0301 Gross per 24 hour  Intake 705.79 ml  Output --  Net 705.79 ml   Filed Weights   07/07/18 1819 07/08/18 0100  Weight: 74.8 kg 66.1 kg    Examination:  General exam: Appears calm and comfortable  Respiratory system: Clear to auscultation. Respiratory effort  normal. Cardiovascular system: S1 & S2 heard, RRR. No JVD, murmurs, rubs, gallops or clicks. No pedal edema. Gastrointestinal system: Abdomen is nondistended, soft and nontender. No organomegaly or masses felt. Normal bowel sounds heard. Central nervous system: Alert and oriented. No focal neurological deficits. Extremities: Symmetric 5 x 5 power. Skin: No rashes, lesions or ulcers Psychiatry: Difficult to evaluate.    Data Reviewed: I have personally reviewed following labs and imaging studies  CBC: Recent Labs  Lab 07/07/18 1845 07/08/18 0406  WBC 27.4* 26.6*  NEUTROABS 22.6*  --   HGB 10.9* 9.9*  HCT 35.7* 33.0*  MCV 84.6 86.4  PLT 424* 376   Basic Metabolic Panel: Recent Labs  Lab 07/07/18 1845 07/08/18 0406  NA 132* 138  K 3.4* 3.8  CL 100 109  CO2 22 22  GLUCOSE 166* 130*  BUN 12 10  CREATININE 0.76 0.48  CALCIUM 8.3* 8.1*   GFR: Estimated Creatinine Clearance: 62.7 mL/min (by C-G formula based on SCr of 0.48 mg/dL). Liver Function Tests: Recent Labs  Lab 07/07/18 1845 07/08/18 0406  AST 50* 41  ALT 21 18  ALKPHOS 150* 131*  BILITOT 0.9 0.8  PROT 6.3* 5.6*  ALBUMIN 2.1* 1.9*   No results for input(s): LIPASE, AMYLASE in the last 168 hours. No results for input(s): AMMONIA in the last 168 hours. Coagulation Profile: Recent Labs  Lab 07/07/18 1846  INR 1.7*   Cardiac Enzymes: Recent Labs  Lab 07/07/18 1845  CKTOTAL 30*  TROPONINI <0.03   BNP (last 3 results) No results for input(s): PROBNP in the last 8760 hours. HbA1C: No results for input(s): HGBA1C in the last 72 hours. CBG: Recent Labs  Lab 07/07/18 1951  GLUCAP 150*   Lipid Profile: No results for input(s): CHOL, HDL, LDLCALC, TRIG, CHOLHDL, LDLDIRECT in the last 72 hours. Thyroid Function Tests: Recent Labs    07/08/18 0052  TSH 0.938   Anemia Panel: No results for input(s): VITAMINB12, FOLATE, FERRITIN, TIBC, IRON, RETICCTPCT in the last 72 hours. Sepsis Labs: Recent  Labs  Lab 07/07/18 2002 07/07/18 2148 07/08/18 0052 07/08/18 0406  PROCALCITON  --   --   --  0.69  LATICACIDVEN 2.3* 1.6 1.4 1.3    Recent Results (from the past 240 hour(s))  Blood culture (routine x 2)     Status: None (Preliminary result)   Collection Time: 07/07/18  8:02 PM  Result Value Ref Range Status   Specimen Description BLOOD RIGHT ANTECUBITAL  Final   Special Requests   Final    BOTTLES DRAWN AEROBIC AND ANAEROBIC Blood Culture adequate volume   Culture   Final    NO GROWTH < 12 HOURS Performed at [facility], [address], [place], [place] [address]    Report Status PENDING  Incomplete  Blood culture (routine x 2)     Status: None (Preliminary result)   Collection Time: 07/07/18  8:07 PM  Result Value Ref Range Status   Specimen Description BLOOD RIGHT HAND  Final   Special Requests   Final  BOTTLES DRAWN AEROBIC AND ANAEROBIC Blood Culture adequate volume   Culture   Final    NO GROWTH < 12 HOURS Performed at [facility], [address] [place], [place] [address]    Report Status PENDING  Incomplete  MRSA PCR Screening     Status: None   Collection Time: 07/08/18 12:37 AM  Result Value Ref Range Status   MRSA by PCR NEGATIVE NEGATIVE Final    Comment:        The GeneXpert MRSA Assay (FDA approved for NASAL specimens only), is one component of a comprehensive MRSA colonization surveillance program. It is not intended to diagnose MRSA infection nor to guide or monitor treatment for MRSA infections. Performed at [facility], [address] [place], [place] [address]          Radiology Studies: Ct Head Wo Contrast  Result Date: 07/07/2018 CLINICAL DATA:  Fall today.  Head injury. EXAM: CT HEAD WITHOUT CONTRAST TECHNIQUE: Contiguous axial images were obtained from the base of the skull through the vertex without intravenous contrast. COMPARISON:  CT head 05/30/2018 FINDINGS: Brain: Ventricle size normal. Small hypodensity in the corona radiata  on the right is unchanged compatible with chronic ischemia. No acute infarct, hemorrhage, or mass lesion. Vascular: Negative for hyperdense vessel Skull: Negative for fracture Sinuses/Orbits: Chronic mucoperiosteal thickening of the left sphenoid sinus. 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   On: 07/07/2018 20:01   Dg Chest Portable 1 View  Result Date: 07/07/2018 CLINICAL DATA:  Pain following fall EXAM: PORTABLE CHEST 1 VIEW COMPARISON:  June 03, 2018 FINDINGS: Atelectatic change in the medial right base noted. Lungs elsewhere clear. Heart size and pulmonary vascularity are normal. No adenopathy. No pneumothorax. No bone lesions. Thoracic stimulator lead tips are in the lower thoracic region. IMPRESSION: Atelectasis medial right base. Early pneumonia in this area cannot be excluded. Lungs elsewhere clear. Stable cardiac silhouette. Electronically Signed   By: Bretta Bang III M.D.   On: 07/07/2018 19:59        Scheduled Meds:  apixaban  5 mg Oral BID   atorvastatin  40 mg Oral Daily   DULoxetine  60 mg Oral Daily   feeding supplement (PRO-STAT SUGAR FREE 64)  30 mL Oral BID   fenofibrate  160 mg Oral Daily   lidocaine  1 patch Transdermal Q24H   nicotine  21 mg Transdermal Daily   potassium chloride  10 mEq Oral Daily   Continuous Infusions:  sodium chloride 100 mL/hr at 07/08/18 0301   ceFEPime (MAXIPIME) IV 2 g (07/08/18 0541)     LOS: 1 day    Time spent: 30 minutes    Addyson Traub Hoover Brunette, DO [facility] Pager [phone]  If 7PM-7AM, please contact night-coverage [email or web address] Password [facility] 07/08/2018, 7:29 AM

## 2018-07-08 NOTE — Consult Note (Addendum)
Referring Provider: Erick Blinks, DO Primary Care Physician:  Junie Spencer, [name] Primary Gastroenterologist:  Previously unassigned. Roetta Sessions, MD  Reason for Consultation:  N/V, abdominal pain, weight loss, possible EGD  HPI: [name] is a 73 y.o. female with hypertension, hyperlipidemia, continued tobacco use, peripheral vascular disease status post remote femoral-popliteal bypass, CAD, COPD, pancreatitis (CT evidence February 2020). In February 2020 she presented to the ED after a fall and during her hospitalization she developed discoloration and coldness to the right lower extremity.  CT angiogram showed complete occlusion to the right iliac limb bypass, she is found to have a clot CFA, SFA profundus femoris and underwent thrombectomy, patch angioplasty May 31, 2018.  She was started on Eliquis.  She was discharged to [facility] for rehabilitation.  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  She had findings consistent with pancreatitis specifically in the tail the pancreas.  There was a 3.2 x 3.2 cm low-attenuation lesion/collection in the tail the pancreas most consistent with a pseudocyst although neoplastic process not excluded entirely but felt to be less likely.  2.8 x 2 cm fluid collection along the greater curvature the stomach likely a pseudocyst.    When she presented yesterday she had a chest x-ray showing atelectasis of the medial right base, early pneumonia not excluded.  Right upper quadrant ultrasound today showed gallbladder sludge, no findings to suggest acute cholecystitis or bile duct dilation.   On admission her LFTs were normal except for alkaline phosphatase of 150, AST 50, albumin 2.1.  Creatinine 0.76, white blood cell count 27,400, hemoglobin 10.9, platelets 424,000, INR 1.7, lactic acid elevated at 2.3, today  down to 1.4.  White blood cell count today 26,600, hemoglobin 9.9.  Alkaline phosphatase down to 131, AST down to 41.  Albumin 1.9.  She has been afebrile.  Blood pressure stable.  Pulse in the 90-100 range.  UA without any evidence of UTI.  Blood cultures pending.  Lipase today 65.  Patient was admitted with generalized weakness.  Suspected sepsis possibly secondary to pneumonia.  Patient reports several week history of poor oral intake, intractable nausea.  Rare vomiting.  She will take a bite knows that she has to stop or she will vomit.  Really denies dysphagia.  No significant heartburn.  Denies abdominal pain but states that she just feels gassy.  She will pass malodorous gas when this occurs.  Reporting 30-40 pound weight loss as well documented.  Bowel movements about every 3 to 4 days is typical for her.  No melena or rectal bleeding.    On Eliquis.  Last dose today around 10 AM.  At baseline has COPD, questionable pneumonia on imaging.  Currently on cefepime.  O2 saturations noted, 93 to 100% room air.  Blood pressure is better today.  No prior EGD and colonoscopy.  Prior to Admission medications   Medication Sig Start Date End Date Taking? Authorizing Provider  alum & mag hydroxide-simeth (MAALOX/MYLANTA) 200-200-20 MG/5ML suspension Take 15-30 mLs by mouth every 2 (two) hours as needed for indigestion. 06/08/18  Yes Delano Metz, MD  amLODipine (NORVASC) 10 MG tablet Take 1 tablet (10 mg total) by mouth daily. 06/08/18  Yes Delano Metz, MD  apixaban (ELIQUIS) 5 MG TABS tablet Take 1 tablet (5 mg total) by mouth 2 (two) times daily. 06/08/18  Yes Schertz,  Molly Maduro, MD  atorvastatin (LIPITOR) 40 MG tablet Take 1 tablet (40 mg total) by mouth daily. 10/23/17  Yes Martin, Mary-Margaret, [name]  DULoxetine (CYMBALTA) 60 MG capsule Take 1 capsule (60 mg total) by mouth daily. 10/23/17  Yes Martin, Mary-Margaret, [name]  fenofibrate 160 MG tablet TAKE 1 TABLET BY MOUTH ONCE DAILY Patient taking  differently: Take 160 mg by mouth daily.  11/25/17  Yes Martin, Mary-Margaret, [name]  lidocaine (LIDODERM) 5 % Place 1 patch onto the skin daily. Remove & Discard patch within 12 hours or as directed by MD 06/08/18  Yes Delano Metz, MD  metoprolol tartrate (LOPRESSOR) 25 MG tablet Take 0.5 tablets (12.5 mg total) by mouth 2 (two) times daily. Patient taking differently: Take 12.5 mg by mouth 3 (three) times daily.  06/08/18  Yes Delano Metz, MD  nicotine (NICODERM CQ - DOSED IN MG/24 HOURS) 21 mg/24hr patch Place 1 patch (21 mg total) onto the skin daily. 06/08/18  Yes Delano Metz, MD  ondansetron (ZOFRAN) 4 MG tablet Take 4 mg by mouth every 4 (four) hours as needed for nausea or vomiting.   Yes [provider]  potassium chloride (K-DUR) 10 MEQ tablet Take 10 mEq by mouth daily.   Yes [provider]  pregabalin (LYRICA) 200 MG capsule Take 1 capsule by mouth 3 (three) times daily. 05/20/18  Yes [provider]  senna-docusate (SENOKOT-S) 8.6-50 MG tablet Take 2 tablets by mouth at bedtime as needed for mild constipation or moderate constipation. Patient taking differently: Take 1 tablet by mouth daily.  06/08/18  Yes Delano Metz, MD  tiZANidine (ZANAFLEX) 4 MG tablet Take 1-2 tablets (4-8 mg total) by mouth every 8 (eight) hours as needed for muscle spasms. 09/02/16  Yes Daphine Deutscher Mary-Margaret, [name]    Current Facility-Administered Medications  Medication Dose Route Frequency Provider Last Rate Last Dose  . 0.9 %  sodium chloride infusion   Intravenous Continuous Maurilio Lovely D, DO 75 mL/hr at 07/08/18 0916    . acetaminophen (TYLENOL) tablet 650 mg  650 mg Oral Q6H PRN Pearson Grippe, MD       Or  . acetaminophen (TYLENOL) suppository 650 mg  650 mg Rectal Q6H PRN Pearson Grippe, MD      . alum & mag hydroxide-simeth (MAALOX/MYLANTA) 200-200-20 MG/5ML suspension 15-30 mL  15-30 mL Oral Q2H PRN Pearson Grippe, MD      . apixaban Everlene Balls) tablet 5 mg  5 mg Oral BID Pearson Grippe,  MD   5 mg at 07/08/18 1025  . atorvastatin (LIPITOR) tablet 40 mg  40 mg Oral Daily Pearson Grippe, MD   40 mg at 07/08/18 1025  . ceFEPIme (MAXIPIME) 2 g in sodium chloride 0.9 % 100 mL IVPB  2 g Intravenous Q8H Vanetta Mulders, MD   Stopped at 07/08/18 [id number]  . DULoxetine (CYMBALTA) DR capsule 60 mg  60 mg Oral Daily Pearson Grippe, MD   60 mg at 07/08/18 1025  . feeding supplement (PRO-STAT SUGAR FREE 64) liquid 30 mL  30 mL Oral BID Pearson Grippe, MD   30 mL at 07/08/18 1027  . fenofibrate tablet 160 mg  160 mg Oral Daily Pearson Grippe, MD      . ipratropium-albuterol (DUONEB) 0.5-2.5 (3) MG/3ML nebulizer solution 3 mL  3 mL Nebulization Q6H PRN Sherryll Burger, Pratik D, DO      . lidocaine (LIDODERM) 5 % 1 patch  1 patch Transdermal Q24H Pearson Grippe, MD   1 patch at 07/08/18 0200  . metoprolol  tartrate (LOPRESSOR) tablet 12.5 mg  12.5 mg Oral BID Sherryll Burger, Pratik D, DO   12.5 mg at 07/08/18 1025  . nicotine (NICODERM CQ - dosed in mg/24 hours) patch 21 mg  21 mg Transdermal Daily Pearson Grippe, MD   21 mg at 07/08/18 1030  . ondansetron (ZOFRAN) tablet 4 mg  4 mg Oral Q4H PRN Pearson Grippe, MD      . potassium chloride (K-DUR) CR tablet 10 mEq  10 mEq Oral Daily Pearson Grippe, MD   10 mEq at 07/08/18 1025  . pregabalin (LYRICA) capsule 200 mg  200 mg Oral TID Sherryll Burger, Pratik D, DO   200 mg at 07/08/18 1025  . senna-docusate (Senokot-S) tablet 2 tablet  2 tablet Oral QHS PRN Pearson Grippe, MD      . tiZANidine (ZANAFLEX) tablet 4-8 mg  4-8 mg Oral Q8H PRN Sherryll Burger, Pratik D, DO        Allergies as of 07/07/2018  . (No Known Allergies)    Past Medical History:  Diagnosis Date  . Chronic back pain   . GERD (gastroesophageal reflux disease)   . Hypercholesterolemia   . Hyperlipidemia   . Hypertension   . Osteoarthritis of left knee    End-stage  . Palpitations   . PVD (peripheral vascular disease) (HCC)   . Tobacco user     Past Surgical History:  Procedure Laterality Date  . Angiogram-type unspecified  06/13/00   Dr. Hart [place]   . FEMORAL-POPLITEAL BYPASS GRAFT    . FINGER TENDON REPAIR  1988   Right mallet  . KNEE ARTHROSCOPY  1996   Left  . LUMBAR FUSION  1998   Dr. Jacqulyn Bath  . PATCH ANGIOPLASTY Right 05/31/2018   Procedure: PATCH ANGIOPLASTY RIGHT FEMORAL ARTERY WITH DECRON PATCH;  Surgeon: Maeola Harman, MD;  Location: [facility] OR;  Service: Vascular;  Laterality: Right;  . THROMBECTOMY FEMORAL ARTERY Right 05/31/2018   Procedure: RIGHT FEMORAL THROMBECTOMY;  Surgeon: Maeola Harman, MD;  Location: [facility] OR;  Service: Vascular;  Laterality: Right;    Family History  Problem Relation Age of Onset  . Heart failure Father        CHF  . Hypertension Father   . Heart disease Father   . Hypertension Paternal Aunt   . Hypertension Paternal Uncle   . Hypertension Paternal Grandmother   . Hypertension Paternal Grandfather   . Coronary artery disease Neg Hx        Premature    Social History   Socioeconomic History  . Marital status: Divorced    Spouse name: Not on file  . Number of children: Not on file  . Years of education: Not on file  . Highest education level: Not on file  Occupational History  . Occupation: Charity fundraiser: [employer]  Social Needs  . Financial resource strain: Not on file  . Food insecurity:    Worry: Not on file    Inability: Not on file  . Transportation needs:    Medical: Not on file    Non-medical: Not on file  Tobacco Use  . Smoking status: Current Every Day Smoker    Packs/day: 1.00    Years: 52.00    Pack years: 52.00    Types: Cigarettes  . Smokeless tobacco: Never Used  Substance and Sexual Activity  . Alcohol use: No    Comment: Occasionally  . Drug use: No  . Sexual activity: Not Currently  Lifestyle  . Physical activity:  Days per week: Not on file    Minutes per session: Not on file  . Stress: Not on file  Relationships  . Social connections:    Talks on phone: Not on file    Gets together: Not on file    Attends religious  service: Not on file    Active member of club or organization: Not on file    Attends meetings of clubs or organizations: Not on file    Relationship status: Not on file  . Intimate partner violence:    Fear of current or ex partner: Not on file    Emotionally abused: Not on file    Physically abused: Not on file    Forced sexual activity: Not on file  Other Topics Concern  . Not on file  Social History Narrative  . Not on file     ROS:  General: Negative for fever.  No chills.  Positive fatigue, weakness.  See HPI for weight loss. Eyes: Negative for vision changes.  ENT: Negative for hoarseness, difficulty swallowing , nasal congestion. CV: Negative for chest pain, angina, palpitations, dyspnea on exertion, peripheral edema.  Respiratory: Negative for dyspnea at rest, dyspnea on exertion, cough, sputum, wheezing.  GI: See history of present illness. GU:  Negative for dysuria, hematuria, urinary incontinence, urinary frequency, nocturnal urination.  MS: Negative for joint pain, positive low back pain.  Derm: Negative for rash or itching.  Neuro: Negative for weakness, abnormal sensation, seizure, frequent headaches, memory loss, confusion.  Psych: Negative for anxiety, depression, suicidal ideation, hallucinations.  Endo: See HPI Heme: Negative for bruising or bleeding. Allergy: Negative for rash or hives.       Physical Examination: Vital signs in last 24 hours: Temp:  [97.4 F (36.3 C)-98.6 F (37 C)] 98.6 F (37 C) (04/08 0828) Pulse Rate:  [25-111] 102 (04/08 1200) Resp:  [19-28] 28 (04/08 1200) BP: (87-148)/(52-116) 125/52 (04/08 1200) SpO2:  [96 %-100 %] 96 % (04/08 1200) Weight:  [66.1 kg-74.8 kg] 66.1 kg (04/08 0100) Last BM Date: 07/07/18  General: Chronically ill-appearing Caucasian female in no acute distress.  Some desaturation and oxygen level to 88% with talking. Head: Normocephalic, atraumatic.   Eyes: Conjunctiva pink, no icterus. Mouth: Oropharyngeal  mucosa moist and pink , no lesions erythema or exudate. Neck: Supple without thyromegaly, masses, or lymphadenopathy.  Lungs: Clear to auscultation bilaterally.  Heart: Regular rate and rhythm, no murmurs rubs or gallops.  Abdomen: Bowel sounds are normal, nontender, nondistended, no hepatosplenomegaly or masses, no abdominal bruits or    hernia , no rebound or guarding.   Rectal: Not performed Extremities: No lower extremity edema, clubbing, deformity.  Neuro: Alert and oriented x 4 , grossly normal neurologically.  Skin: Warm and dry, no rash or jaundice.   Psych: Alert and cooperative, normal mood and affect.        Intake/Output from previous day: 04/07 0701 - 04/08 0700 In: 705.8 [I.V.:705.8] Out: -  Intake/Output this shift: Total I/O In: 641.6 [I.V.:541.6; IV Piggyback:100] Out: 850 [Urine:850]  Lab Results: CBC Recent Labs    07/07/18 1845 07/08/18 0406  WBC 27.4* 26.6*  HGB 10.9* 9.9*  HCT 35.7* 33.0*  MCV 84.6 86.4  PLT 424* 376   BMET Recent Labs    07/07/18 1845 07/08/18 0406  NA 132* 138  K 3.4* 3.8  CL 100 109  CO2 22 22  GLUCOSE 166* 130*  BUN 12 10  CREATININE 0.76 0.48  CALCIUM 8.3* 8.1*  LFT Recent Labs    07/07/18 1845 07/08/18 0406  BILITOT 0.9 0.8  ALKPHOS 150* 131*  AST 50* 41  ALT 21 18  PROT 6.3* 5.6*  ALBUMIN 2.1* 1.9*    Lipase No results for input(s): LIPASE in the last 72 hours.  PT/INR Recent Labs    07/07/18 1846  LABPROT 19.9*  INR 1.7*      Imaging Studies: Ct Head Wo Contrast  Result Date: 07/07/2018 CLINICAL DATA:  Fall today.  Head injury. EXAM: CT HEAD WITHOUT CONTRAST TECHNIQUE: Contiguous axial images were obtained from the base of the skull through the vertex without intravenous contrast. COMPARISON:  CT head 05/30/2018 FINDINGS: Brain: Ventricle size normal. Small hypodensity in the corona radiata on the right is unchanged compatible with chronic ischemia. No acute infarct, hemorrhage, or mass lesion.  Vascular: Negative for hyperdense vessel Skull: Negative for fracture Sinuses/Orbits: Chronic mucoperiosteal thickening of the left sphenoid sinus. 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   On: 07/07/2018 20:01   Dg Chest Portable 1 View  Result Date: 07/07/2018 CLINICAL DATA:  Pain following fall EXAM: PORTABLE CHEST 1 VIEW COMPARISON:  June 03, 2018 FINDINGS: Atelectatic change in the medial right base noted. Lungs elsewhere clear. Heart size and pulmonary vascularity are normal. No adenopathy. No pneumothorax. No bone lesions. Thoracic stimulator lead tips are in the lower thoracic region. IMPRESSION: Atelectasis medial right base. Early pneumonia in this area cannot be excluded. Lungs elsewhere clear. Stable cardiac silhouette. Electronically Signed   By: Bretta Bang III M.D.   On: 07/07/2018 19:59   US Abdomen Limited Ruq  Result Date: 07/08/2018 CLINICAL DATA:  Abnormal liver function test. EXAM: ULTRASOUND ABDOMEN LIMITED RIGHT UPPER QUADRANT COMPARISON:  None. FINDINGS: Gallbladder: Significant sludge within the gallbladder, but no shadowing stones, no wall thickening and no pericholecystic fluid. Common bile duct: Diameter: 5 mm Liver: No focal lesion identified. Within normal limits in parenchymal echogenicity. Portal vein is patent on color Doppler imaging with normal direction of blood flow towards the liver. IMPRESSION: 1. No acute findings.  No acute cholecystitis or bile duct dilation. 2. Gallbladder sludge without stones. 3. Normal sonographic appearance of the liver. Electronically Signed   By: Amie Portland M.D.   On: 07/08/2018 10:23  [4 week]   Impression: Pleasant 73 year old female with multiple comorbidities as outlined above who presented to the emergency  department after a fall at home.  Patient reports episodes of presyncope which have been ongoing.  On admission concern for possible sepsis related to possible pneumonia.  Blood cultures pending.  Urinalysis unremarkable.  She is on cefepime.  Clinically improved.  We were asked to see the patient regarding chronic nausea, poor appetite and weight loss.  History significant for peripheral vascular disease, CT angio approximately 6 weeks ago showed 40% stenosis of the SMA, occlusion of the IMA and celiac axis.  Incidentally noted to have inflammatory changes of the tail the pancreas consistent with acute pancreatitis with 3.2 x 3.2 cm lesion most consistent with a pseudocyst.  Neoplastic process not excluded but felt to be unlikely.  Lipase minimally elevated at the time.  Weight loss, nausea, vague abdominal discomfort of unclear etiology.  Cannot exclude ischemic process such as mesenteric ischemia or even ischemic pancreatitis.  Possibility of increased size of pseudocyst  with extrinsic compression of the stomach remains in the differential as well.  We will plan on EGD on Friday.  If unrevealing or shows significant external compression, neck step would be pancreatic protocol CT.  Leukocytosis with mild improvement. ?etiology. Less likely related to mesenteric ischemia or pancreatitis. Patient with good bowel sounds. No abdominal pain on exam.   Plan: 1. Plan for EGD with conscious sedation on Friday.  I have discussed the risks, alternatives, benefits with regards to but not limited to the risk of reaction to medication, bleeding, infection, perforation and the patient is agreeable to proceed. Written consent to be obtained. 2. Patient remains on Eliquis, will address with hospitalist regarding potentially holding for procedure. 3. Add PPI.   We would like to thank you for the opportunity to participate in the care of [name].  Leanna Battles. Dixon Boos [facility] [phone] 4/8/20203:42 PM    LOS: 1 day    addendum: Discussed with Dr. Sherryll Burger. Will hold Eliquis due to plans for EGD on Friday.   Leanna Battles. Dixon Boos [facility] [phone] 4/8/20203:53 PM

## 2018-07-09 ENCOUNTER — Inpatient Hospital Stay (HOSPITAL_COMMUNITY): Payer: Medicare Other

## 2018-07-09 DIAGNOSIS — I4891 Unspecified atrial fibrillation: Secondary | ICD-10-CM

## 2018-07-09 LAB — COMPREHENSIVE METABOLIC PANEL
ALT: 18 U/L (ref 0–44)
AST: 44 U/L — ABNORMAL HIGH (ref 15–41)
Albumin: 1.9 g/dL — ABNORMAL LOW (ref 3.5–5.0)
Alkaline Phosphatase: 134 U/L — ABNORMAL HIGH (ref 38–126)
Anion gap: 10 (ref 5–15)
BUN: 12 mg/dL (ref 8–23)
CO2: 21 mmol/L — ABNORMAL LOW (ref 22–32)
Calcium: 8.1 mg/dL — ABNORMAL LOW (ref 8.9–10.3)
Chloride: 102 mmol/L (ref 98–111)
Creatinine, Ser: 0.51 mg/dL (ref 0.44–1.00)
GFR calc Af Amer: >60 mL/min (ref >60)
GFR calc non Af Amer: >60 mL/min (ref >60)
Glucose, Bld: 94 mg/dL (ref 70–99)
Potassium: 2.5 mmol/L — CL (ref 3.5–5.1)
Sodium: 133 mmol/L — ABNORMAL LOW (ref 135–145)
Total Bilirubin: 0.8 mg/dL (ref 0.3–1.2)
Total Protein: 5.9 g/dL — ABNORMAL LOW (ref 6.5–8.1)

## 2018-07-09 LAB — LACTIC ACID, PLASMA: Lactic Acid, Venous: 1.1 mmol/L (ref 0.5–1.9)

## 2018-07-09 LAB — CBC
HCT: 35 % — ABNORMAL LOW (ref 36.0–46.0)
Hemoglobin: 10.5 g/dL — ABNORMAL LOW (ref 12.0–15.0)
MCH: 25.4 pg — ABNORMAL LOW (ref 26.0–34.0)
MCHC: 30 g/dL (ref 30.0–36.0)
MCV: 84.7 fL (ref 80.0–100.0)
Platelets: 441 10*3/uL — ABNORMAL HIGH (ref 150–400)
RBC: 4.13 MIL/uL (ref 3.87–5.11)
RDW: 18 % — ABNORMAL HIGH (ref 11.5–15.5)
WBC: 23.2 10*3/uL — ABNORMAL HIGH (ref 4.0–10.5)
nRBC: 0 % (ref 0.0–0.2)

## 2018-07-09 LAB — ECHOCARDIOGRAM LIMITED
Height: 67 in
Weight: 2352.75 oz

## 2018-07-09 LAB — PROCALCITONIN: Procalcitonin: 0.47 ng/mL

## 2018-07-09 LAB — APTT
aPTT: 69 seconds — ABNORMAL HIGH (ref 24–36)
aPTT: 66 seconds — ABNORMAL HIGH (ref 24–36)

## 2018-07-09 LAB — HEPARIN LEVEL (UNFRACTIONATED)
Heparin Unfractionated: 1.88 IU/mL — ABNORMAL HIGH (ref 0.30–0.70)
Heparin Unfractionated: 1.3 IU/mL — ABNORMAL HIGH (ref 0.30–0.70)

## 2018-07-09 MED ORDER — HEPARIN (PORCINE) 25000 UT/250ML-% IV SOLN
1450.0000 [IU]/h | INTRAVENOUS | Status: AC
  Administered 2018-07-09 – 2018-07-10 (×2): 1000 [IU]/h via INTRAVENOUS
  Administered 2018-07-11 – 2018-07-14 (×4): 1450 [IU]/h via INTRAVENOUS
  Filled 2018-07-09 (×7): qty 250

## 2018-07-09 MED ORDER — POTASSIUM CHLORIDE CRYS ER 20 MEQ PO TBCR
40.0000 meq | EXTENDED_RELEASE_TABLET | Freq: Once | ORAL | Status: AC
  Administered 2018-07-09: 40 meq via ORAL
  Filled 2018-07-09: qty 2

## 2018-07-09 MED ORDER — SODIUM CHLORIDE 0.9 % IV SOLN
INTRAVENOUS | Status: DC | PRN
  Administered 2018-07-09: 100 mL via INTRAVENOUS
  Administered 2018-08-07: 22:00:00 1000 mL via INTRAVENOUS

## 2018-07-09 MED ORDER — IOHEXOL 300 MG/ML  SOLN
100.0000 mL | Freq: Once | INTRAMUSCULAR | Status: AC | PRN
  Administered 2018-07-09: 100 mL via INTRAVENOUS

## 2018-07-09 MED ORDER — DILTIAZEM HCL 60 MG PO TABS
30.0000 mg | ORAL_TABLET | Freq: Four times a day (QID) | ORAL | Status: DC
  Administered 2018-07-09 – 2018-07-11 (×10): 30 mg via ORAL
  Filled 2018-07-09 (×11): qty 1

## 2018-07-09 MED ORDER — LACTATED RINGERS IV BOLUS: 1000.0000 mL | Freq: Once | INTRAVENOUS | Status: DC

## 2018-07-09 MED ORDER — CHLORHEXIDINE GLUCONATE CLOTH 2 % EX PADS
6.0000 | MEDICATED_PAD | Freq: Every day | CUTANEOUS | Status: DC
  Administered 2018-07-09 – 2018-08-08 (×27): 6 via TOPICAL

## 2018-07-09 MED ORDER — DILTIAZEM HCL 25 MG/5ML IV SOLN
10.0000 mg | Freq: Once | INTRAVENOUS | Status: AC
  Administered 2018-07-09: 10 mg via INTRAVENOUS
  Filled 2018-07-09: qty 5

## 2018-07-09 MED ORDER — LACTATED RINGERS IV BOLUS
500.0000 mL | Freq: Once | INTRAVENOUS | Status: AC
  Administered 2018-07-09: 500 mL via INTRAVENOUS

## 2018-07-09 MED ORDER — HEPARIN BOLUS VIA INFUSION
4000.0000 [IU] | Freq: Once | INTRAVENOUS | Status: AC
  Administered 2018-07-09: 08:00:00 4000 [IU] via INTRAVENOUS
  Filled 2018-07-09: qty 4000

## 2018-07-09 MED ORDER — METOPROLOL TARTRATE 25 MG PO TABS
25.0000 mg | ORAL_TABLET | Freq: Two times a day (BID) | ORAL | Status: DC
  Administered 2018-07-09 – 2018-07-11 (×5): 25 mg via ORAL
  Filled 2018-07-09 (×5): qty 1

## 2018-07-09 MED ORDER — POTASSIUM CHLORIDE 10 MEQ/100ML IV SOLN
10.0000 meq | INTRAVENOUS | Status: AC
  Administered 2018-07-09 (×6): 10 meq via INTRAVENOUS
  Filled 2018-07-09 (×4): qty 100

## 2018-07-09 MED ORDER — LACTATED RINGERS IV SOLN
INTRAVENOUS | Status: DC
  Administered 2018-07-09 – 2018-07-11 (×4): via INTRAVENOUS

## 2018-07-09 NOTE — Progress Notes (Signed)
Patient recently put in chair by pt, heart rate went to 180, went back down with medicine from nurse. Spoke with tech, patient back in bed before lunch. Will come back then.

## 2018-07-09 NOTE — Progress Notes (Signed)
PROGRESS NOTE    [name]  ZOX:[id number] DOB: 11/01/1945 DOA: 07/07/2018 PCP: Junie Spencer, [name]   Brief Narrative:  Per HPI: [name] a 73 y.o.femalewith medical history significant ofchronic back pain, GERD, hyperlipidemia, hypertension, left knee osteoarthritis, palpitations, PVDh/o Aortofem bypass, s/p RLE thromboembolectomy and R common emoral artery patch angioplasty 05/31/18, history of tobacco usewho presents to ER due to fall at home. She just was discharged from [facility] yesterday.   Pt is very somnolent , and difficult to elicit history from.  Patient was admitted with generalized weakness secondary to suspected sepsis with questionable source of pneumonia.  She was also noted to be hypotensive on admission, but this has improved.  She is noted to have significant GI issues to include loss of appetite and weight loss recently for which EGD is being planned for 4/10. PT evaluation pending with hopeful discharge to home health when ready for discharge.   Assessment & Plan:   Principal Problem:   Sepsis (HCC) Active Problems:   Hyperlipidemia with target LDL less than 100   Essential hypertension   Peripheral vascular disease (HCC)   Severe protein-calorie malnutrition (HCC)   Abnormal liver function   Loss of weight   Nausea without vomiting  1. Sepsis secondary to questionable pneumonia.  Continue treatment on cefepime with blood culture results demonstrating no growth as of yet. Anticipate conversion to Augmentin prior to discharge.    Will maintain on gentle IV fluid. 2. Hypotension secondary to above-resolved.  Continue to hold amlodipine for now and double metoprolol due to elevated blood pressure and tachycardia.  3. Poor oral intake due to intractable nausea and vomiting and abdominal pain with associated weight loss.  Continue on current diet and GI plans for endoscopy on 4/10.  Eliquis withheld. 4. Severe hypokalemia.  Replete IV and  oral today and recheck in a.m. along with magnesium.  Maintain in stepdown unit for close telemetry monitoring. 5. PVD with dry gangrene.    Eliquis held for now and follow-up with vascular on discharge. 6. Dyslipidemia.  Continue on fenofibrate and atorvastatin. 7. COPD with tobacco abuse.  Duo nebs every 6 hours as needed and nicotine patch.  Smoking cessation advised. 8. Essential hypertension.  Restarted on metoprolol and hold amlodipine.  Metoprolol dose doubled today.  We will also add Cardizem 30 mg every 6 hours and assess response while holding amlodipine. 9. GERD.  Maalox as needed.  Started on PPI. 10. Abnormal LFTs.    Right upper quadrant ultrasound with no acute findings.  This has improved, however.  Recheck labs in a.m.  Will need outpatient follow-up as she is on statin. 11. Fall at home with weakness.  PT to evaluate.  May require placement once again and/or home health. 12. Atrial fibrillation- likely new onset.  Maintain on heparin drip for now and check 2D echocardiogram.  Cardizem added for additional rate control and will monitor response.   DVT prophylaxis:  Eliquis held for now; will place on heparin drip due to atrial fibrillation. Code Status: Full Family Communication: Spoke with Daughter [name] [phone] Disposition Plan: Continue treatment of pneumonia with cefepime and IV fluid.  PT evaluation pending.  GI for endoscopy on 4/10.   Consultants:   GI  Procedures:   None  Antimicrobials:   Vancomycin 4/7-4/8  Cefepime 4/7->  Subjective: Patient seen and evaluated today with no new acute complaints or concerns. No acute concerns or events noted overnight.  She has not been  able to eat very much due to abdominal pain.  Objective: Vitals:   07/09/18 0400 07/09/18 0405 07/09/18 0500 07/09/18 0600  BP: (!) 133/97  139/78 134/80  Pulse: 93  (!) 110 (!) 110  Resp: (!) 28  (!) 29 (!) 26  Temp:  98.6 F (37 C)    TempSrc:  Oral    SpO2:  93%  94% 93%  Weight:   66.7 kg   Height:        Intake/Output Summary (Last 24 hours) at 07/09/2018 0702 Last data filed at 07/09/2018 1610 Gross per 24 hour  Intake 848.76 ml  Output 2900 ml  Net -2051.24 ml   Filed Weights   07/07/18 1819 07/08/18 0100 07/09/18 0500  Weight: 74.8 kg 66.1 kg 66.7 kg    Examination:  General exam: Appears calm and comfortable  Respiratory system: Clear to auscultation. Respiratory effort normal.  Currently on 2 L nasal cannula. Cardiovascular system: S1 & S2 heard, RRR. No JVD, murmurs, rubs, gallops or clicks. No pedal edema. Gastrointestinal system: Abdomen is nondistended, soft and nontender. No organomegaly or masses felt. Normal bowel sounds heard. Central nervous system: Alert and oriented. No focal neurological deficits. Extremities: Symmetric 5 x 5 power. Skin: No rashes, lesions or ulcers Psychiatry: Judgement and insight appear normal. Mood & affect appropriate.     Data Reviewed: I have personally reviewed following labs and imaging studies  CBC: Recent Labs  Lab 07/07/18 1845 07/08/18 0406 07/09/18 0536  WBC 27.4* 26.6* 23.2*  NEUTROABS 22.6*  --   --   HGB 10.9* 9.9* 10.5*  HCT 35.7* 33.0* 35.0*  MCV 84.6 86.4 84.7  PLT 424* 376 441*   Basic Metabolic Panel: Recent Labs  Lab 07/07/18 1845 07/08/18 0406 07/09/18 0536  NA 132* 138 133*  K 3.4* 3.8 2.5*  CL 100 109 102  CO2 22 22 21*  GLUCOSE 166* 130* 94  BUN CREATININE 0.76 0.48 0.51  CALCIUM 8.3* 8.1* 8.1*   GFR: Estimated Creatinine Clearance: 62.7 mL/min (by C-G formula based on SCr of 0.51 mg/dL). Liver Function Tests: Recent Labs  Lab 07/07/18 1845 07/08/18 0406 07/09/18 0536  AST 50* 41 44*  ALT ALKPHOS 150* 131* 134*  BILITOT 0.9 0.8 0.8  PROT 6.3* 5.6* 5.9*  ALBUMIN 2.1* 1.9* 1.9*   Recent Labs  Lab 07/08/18 1325  LIPASE 65*   No results for input(s): AMMONIA in the last 168 hours. Coagulation Profile: Recent Labs   Lab 07/07/18 1846  INR 1.7*   Cardiac Enzymes: Recent Labs  Lab 07/07/18 1845  CKTOTAL 30*  TROPONINI <0.03   BNP (last 3 results) No results for input(s): PROBNP in the last 8760 hours. HbA1C: No results for input(s): HGBA1C in the last 72 hours. CBG: Recent Labs  Lab 07/07/18 1951  GLUCAP 150*   Lipid Profile: No results for input(s): CHOL, HDL, LDLCALC, TRIG, CHOLHDL, LDLDIRECT in the last 72 hours. Thyroid Function Tests: Recent Labs    07/08/18 0052  TSH 0.938   Anemia Panel: No results for input(s): VITAMINB12, FOLATE, FERRITIN, TIBC, IRON, RETICCTPCT in the last 72 hours. Sepsis Labs: Recent Labs  Lab 07/07/18 2148 07/08/18 0052 07/08/18 0406 07/09/18 0536  PROCALCITON  --   --  0.69 0.47  LATICACIDVEN 1.6 1.4 1.3 1.1    Recent Results (from the past 240 hour(s))  Blood culture (routine x 2)     Status: None (Preliminary result)   Collection Time:  07/07/18  8:02 PM  Result Value Ref Range Status   Specimen Description BLOOD RIGHT ANTECUBITAL  Final   Special Requests   Final    BOTTLES DRAWN AEROBIC AND ANAEROBIC Blood Culture adequate volume   Culture   Final    NO GROWTH < 12 HOURS Performed at [facility], [address] [place], [place] [address]    Report Status PENDING  Incomplete  Blood culture (routine x 2)     Status: None (Preliminary result)   Collection Time: 07/07/18  8:07 PM  Result Value Ref Range Status   Specimen Description BLOOD RIGHT HAND  Final   Special Requests   Final    BOTTLES DRAWN AEROBIC AND ANAEROBIC Blood Culture adequate volume   Culture   Final    NO GROWTH < 12 HOURS Performed at [facility], [address] [place], [place] [address]    Report Status PENDING  Incomplete  MRSA PCR Screening     Status: None   Collection Time: 07/08/18 12:37 AM  Result Value Ref Range Status   MRSA by PCR NEGATIVE NEGATIVE Final    Comment:        The GeneXpert MRSA Assay (FDA approved for NASAL specimens only), is one  component of a comprehensive MRSA colonization surveillance program. It is not intended to diagnose MRSA infection nor to guide or monitor treatment for MRSA infections. Performed at [facility], [address], [place], [place] [address]          Radiology Studies: Ct Head Wo Contrast  Result Date: 07/07/2018 CLINICAL DATA:  Fall today.  Head injury. EXAM: CT HEAD WITHOUT CONTRAST TECHNIQUE: Contiguous axial images were obtained from the base of the skull through the vertex without intravenous contrast. COMPARISON:  CT head 05/30/2018 FINDINGS: Brain: Ventricle size normal. Small hypodensity in the corona radiata on the right is unchanged compatible with chronic ischemia. No acute infarct, hemorrhage, or mass lesion. Vascular: Negative for hyperdense vessel Skull: Negative for fracture Sinuses/Orbits: Chronic mucoperiosteal thickening of the left sphenoid sinus. 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   On: 07/07/2018 20:01   Dg Chest Portable 1 View  Result Date: 07/07/2018 CLINICAL DATA:  Pain following fall EXAM: PORTABLE CHEST 1 VIEW COMPARISON:  June 03, 2018 FINDINGS: Atelectatic change in the medial right base noted. Lungs elsewhere clear. Heart size and pulmonary vascularity are normal. No adenopathy. No pneumothorax. No bone lesions. Thoracic stimulator lead tips are in the lower thoracic region. IMPRESSION: Atelectasis medial right base. Early pneumonia in this area cannot be excluded. Lungs elsewhere clear. Stable cardiac silhouette. Electronically Signed   By: Bretta Bang III M.Danielle.   On: 07/07/2018 19:59   US Abdomen Limited Ruq  Result Date: 07/08/2018 CLINICAL DATA:  Abnormal liver function test. EXAM: ULTRASOUND ABDOMEN LIMITED RIGHT UPPER QUADRANT COMPARISON:   None. FINDINGS: Gallbladder: Significant sludge within the gallbladder, but no shadowing stones, no wall thickening and no pericholecystic fluid. Common bile duct: Diameter: 5 mm Liver: No focal lesion identified. Within normal limits in parenchymal echogenicity. Portal vein is patent on color Doppler imaging with normal direction of blood flow towards the liver. IMPRESSION: 1. No acute findings.  No acute cholecystitis or bile duct dilation. 2. Gallbladder sludge without stones. 3. Normal sonographic appearance of the liver. Electronically  Signed   By: Amie Portlandavid  Ormond M.Danielle.   On: 07/08/2018 10:23        Scheduled Meds: . atorvastatin  40 mg Oral Daily  . Chlorhexidine Gluconate Cloth  6 each Topical Q0600  . DULoxetine  60 mg Oral Daily  . feeding supplement (PRO-STAT SUGAR FREE 64)  30 mL Oral BID  . fenofibrate  160 mg Oral Daily  . lidocaine  1 patch Transdermal Q24H  . metoprolol tartrate  12.5 mg Oral BID  . nicotine  21 mg Transdermal Daily  . pantoprazole  40 mg Oral Q1200  . potassium chloride  10 mEq Oral Daily  . potassium chloride  40 mEq Oral Once  . pregabalin  200 mg Oral TID   Continuous Infusions: . sodium chloride 100 mL (07/09/18 0639)  . ceFEPime (MAXIPIME) IV 2 g (07/09/18 16100637)  . potassium chloride       LOS: 2 days    Time spent: 30 minutes    Najae Rathert Hoover BrunetteD Zaley Talley, DO [facility] Pager [phone]  If 7PM-7AM, please contact night-coverage [email or web address] Password [email or web address] 07/09/2018, 7:02 AM

## 2018-07-09 NOTE — Progress Notes (Signed)
ANTICOAGULATION CONSULT NOTE - Initial Consult  Pharmacy Consult for heparin gtt  Indication: atrial fibrillation  No Known Allergies  Patient Measurements: Height: 5\' 7"  (170.2 cm) Weight: 147 lb 0.8 oz (66.7 kg) IBW/kg (Calculated) : 61.6 Heparin Dosing Weight: HEPARIN DW (KG): 66.1   Vital Signs: Temp: 98.4 F (36.9 C) (04/09 0800) Temp Source: Oral (04/09 0800) BP: 126/76 (04/09 1400) Pulse Rate: 84 (04/09 1400)  Labs: Recent Labs    07/07/18 1845 07/07/18 1846 07/08/18 0406 07/09/18 0536 07/09/18 1340  HGB 10.9*  --  9.9* 10.5*  --   HCT 35.7*  --  33.0* 35.0*  --   PLT 424*  --  376 441*  --   APTT  --  41*  --   --  69*  LABPROT  --  19.9*  --   --   --   INR  --  1.7*  --   --   --   HEPARINUNFRC  --   --   --   --  1.88*  CREATININE 0.76  --  0.48 0.51  --   CKTOTAL 30*  --   --   --   --   TROPONINI <0.03  --   --   --   --     Estimated Creatinine Clearance: 62.7 mL/min (by C-G formula based on SCr of 0.51 mg/dL).   Medical History: Past Medical History:  Diagnosis Date  . Chronic back pain   . GERD (gastroesophageal reflux disease)   . Hypercholesterolemia   . Hyperlipidemia   . Hypertension   . Osteoarthritis of left knee    End-stage  . Palpitations   . PVD (peripheral vascular disease) (HCC)   . Tobacco user     Medications:  Medications Prior to Admission  Medication Sig Dispense Refill Last Dose  . alum & mag hydroxide-simeth (MAALOX/MYLANTA) 200-200-20 MG/5ML suspension Take 15-30 mLs by mouth every 2 (two) hours as needed for indigestion. 355 mL 0 unknown  . amLODipine (NORVASC) 10 MG tablet Take 1 tablet (10 mg total) by mouth daily. 30 tablet 0 07/07/2018 at Unknown time  . apixaban (ELIQUIS) 5 MG TABS tablet Take 1 tablet (5 mg total) by mouth 2 (two) times daily. 60 tablet  07/07/2018 at 8-900  . atorvastatin (LIPITOR) 40 MG tablet Take 1 tablet (40 mg total) by mouth daily. 30 tablet 5 07/07/2018 at Unknown time  . DULoxetine  (CYMBALTA) 60 MG capsule Take 1 capsule (60 mg total) by mouth daily. 30 capsule 5 07/07/2018 at Unknown time  . fenofibrate 160 MG tablet TAKE 1 TABLET BY MOUTH ONCE DAILY (Patient taking differently: Take 160 mg by mouth daily. ) 90 tablet 1 07/07/2018 at Unknown time  . lidocaine (LIDODERM) 5 % Place 1 patch onto the skin daily. Remove & Discard patch within 12 hours or as directed by MD 30 patch 0 07/06/2018 at Unknown time  . metoprolol tartrate (LOPRESSOR) 25 MG tablet Take 0.5 tablets (12.5 mg total) by mouth 2 (two) times daily. (Patient taking differently: Take 12.5 mg by mouth 3 (three) times daily. )   07/07/2018 at 8-900  . nicotine (NICODERM CQ - DOSED IN MG/24 HOURS) 21 mg/24hr patch Place 1 patch (21 mg total) onto the skin daily. 28 patch 0 07/07/2018 at 1345  . ondansetron (ZOFRAN) 4 MG tablet Take 4 mg by mouth every 4 (four) hours as needed for nausea or vomiting.   07/07/2018 at 1345  . potassium chloride (K-DUR) 10  MEQ tablet Take 10 mEq by mouth daily.   07/07/2018 at Unknown time  . pregabalin (LYRICA) 200 MG capsule Take 1 capsule by mouth 3 (three) times daily.   07/07/2018 at Unknown time  . senna-docusate (SENOKOT-S) 8.6-50 MG tablet Take 2 tablets by mouth at bedtime as needed for mild constipation or moderate constipation. (Patient taking differently: Take 1 tablet by mouth daily. )   07/07/2018 at Unknown time  . tiZANidine (ZANAFLEX) 4 MG tablet Take 1-2 tablets (4-8 mg total) by mouth every 8 (eight) hours as needed for muscle spasms. 90 tablet 1 07/07/2018 at 1500   Scheduled:  . atorvastatin  40 mg Oral Daily  . Chlorhexidine Gluconate Cloth  6 each Topical Q0600  . diltiazem  30 mg Oral Q6H  . DULoxetine  60 mg Oral Daily  . feeding supplement (PRO-STAT SUGAR FREE 64)  30 mL Oral BID  . fenofibrate  160 mg Oral Daily  . lidocaine  1 patch Transdermal Q24H  . metoprolol tartrate  25 mg Oral BID  . nicotine  21 mg Transdermal Daily  . pantoprazole  40 mg Oral Q1200  . potassium  chloride  10 mEq Oral Daily  . pregabalin  200 mg Oral TID   Infusions:  . sodium chloride 100 mL (07/09/18 0639)  . ceFEPime (MAXIPIME) IV 2 g (07/09/18 1427)  . heparin 1,000 Units/hr (07/09/18 0802)  . lactated ringers 75 mL/hr at 07/09/18 1010   PRN: sodium chloride, acetaminophen **OR** acetaminophen, alum & mag hydroxide-simeth, ipratropium-albuterol, ondansetron, senna-docusate, tiZANidine Anti-infectives (From admission, onward)   Start     Dose/Rate Route Frequency Ordered Stop   07/08/18 2300  vancomycin (VANCOCIN) 1,500 mg in sodium chloride 0.9 % 500 mL IVPB  Status:  Discontinued     1,500 mg 250 mL/hr over 120 Minutes Intravenous  Once 07/07/18 2128 07/07/18 2129   07/08/18 2300  vancomycin (VANCOCIN) 1,500 mg in sodium chloride 0.9 % 500 mL IVPB  Status:  Discontinued     1,500 mg 250 mL/hr over 120 Minutes Intravenous Every 24 hours 07/07/18 2129 07/08/18 0726   07/08/18 0600  ceFEPIme (MAXIPIME) 2 g in sodium chloride 0.9 % 100 mL IVPB     2 g 200 mL/hr over 30 Minutes Intravenous Every 8 hours 07/07/18 2124     07/07/18 2130  vancomycin (VANCOCIN) 1,500 mg in sodium chloride 0.9 % 500 mL IVPB     1,500 mg 250 mL/hr over 120 Minutes Intravenous  Once 07/07/18 2123 07/08/18 0028   07/07/18 2115  vancomycin (VANCOCIN) IVPB 1000 mg/200 mL premix  Status:  Discontinued     1,000 mg 200 mL/hr over 60 Minutes Intravenous  Once 07/07/18 2110 07/07/18 2128   07/07/18 2115  ceFEPIme (MAXIPIME) 2 g in sodium chloride 0.9 % 100 mL IVPB     2 g 200 mL/hr over 30 Minutes Intravenous  Once 07/07/18 2110 07/07/18 2203      Assessment: [name] a 73 y.o. female requires anticoagulation with a heparin iv infusion for the indication of  atrial fibrillation. Heparin gtt will be started following pharmacy protocol per pharmacy consult. Patient is on previous oral anticoagulant that will require aPTT/HL correlation before transitioning to only HL monitoring.  APTT 69, goal is  50-90 seconds, therapeutic. HL is 1.88 that cannot be deemed reliable due to previous apixaban administration.   Goal of Therapy:  Target aPTT 50-90 seconds Heparin level 0.3-0.7 units/ml Monitor platelets by anticoagulation protocol: Yes   Plan:  APTT in 6 hours to confirm rate Levels no not correlate yet, but will continue to get both labs moving forward until correlation.   Continue heparin infusion at 1000 units/hr Check anti-Xa level in 6 hours and daily while on heparin Continue to monitor H&H and platelets   [name] 07/09/2018,3:15 PM

## 2018-07-09 NOTE — Progress Notes (Signed)
CRITICAL VALUE ALERT  Critical Value:  Potassium 2.5  Date & Time Notied:  07/09/18, 3500  Provider Notified: Sherryll Burger  Orders Received/Actions taken:

## 2018-07-09 NOTE — Progress Notes (Addendum)
ANTICOAGULATION CONSULT NOTE - Initial Consult  Pharmacy Consult for heparin gtt  Indication: atrial fibrillation  No Known Allergies  Patient Measurements: Height: 5\' 7"  (170.2 cm) Weight: 147 lb 0.8 oz (66.7 kg) IBW/kg (Calculated) : 61.6 Heparin Dosing Weight: HEPARIN DW (KG): 66.1   Vital Signs: Temp: 98.6 F (37 C) (04/09 0405) Temp Source: Oral (04/09 0405) BP: 134/80 (04/09 0600) Pulse Rate: 110 (04/09 0600)  Labs: Recent Labs    07/07/18 1845 07/07/18 1846 07/08/18 0406 07/09/18 0536  HGB 10.9*  --  9.9* 10.5*  HCT 35.7*  --  33.0* 35.0*  PLT 424*  --  376 441*  APTT  --  41*  --   --   LABPROT  --  19.9*  --   --   INR  --  1.7*  --   --   CREATININE 0.76  --  0.48 0.51  CKTOTAL 30*  --   --   --   TROPONINI <0.03  --   --   --     Estimated Creatinine Clearance: 62.7 mL/min (by C-G formula based on SCr of 0.51 mg/dL).   Medical History: Past Medical History:  Diagnosis Date  . Chronic back pain   . GERD (gastroesophageal reflux disease)   . Hypercholesterolemia   . Hyperlipidemia   . Hypertension   . Osteoarthritis of left knee    End-stage  . Palpitations   . PVD (peripheral vascular disease) (HCC)   . Tobacco user     Medications:  Medications Prior to Admission  Medication Sig Dispense Refill Last Dose  . alum & mag hydroxide-simeth (MAALOX/MYLANTA) 200-200-20 MG/5ML suspension Take 15-30 mLs by mouth every 2 (two) hours as needed for indigestion. 355 mL 0 unknown  . amLODipine (NORVASC) 10 MG tablet Take 1 tablet (10 mg total) by mouth daily. 30 tablet 0 07/07/2018 at Unknown time  . apixaban (ELIQUIS) 5 MG TABS tablet Take 1 tablet (5 mg total) by mouth 2 (two) times daily. 60 tablet  07/07/2018 at 8-900  . atorvastatin (LIPITOR) 40 MG tablet Take 1 tablet (40 mg total) by mouth daily. 30 tablet 5 07/07/2018 at Unknown time  . DULoxetine (CYMBALTA) 60 MG capsule Take 1 capsule (60 mg total) by mouth daily. 30 capsule 5 07/07/2018 at Unknown time   . fenofibrate 160 MG tablet TAKE 1 TABLET BY MOUTH ONCE DAILY (Patient taking differently: Take 160 mg by mouth daily. ) 90 tablet 1 07/07/2018 at Unknown time  . lidocaine (LIDODERM) 5 % Place 1 patch onto the skin daily. Remove & Discard patch within 12 hours or as directed by MD 30 patch 0 07/06/2018 at Unknown time  . metoprolol tartrate (LOPRESSOR) 25 MG tablet Take 0.5 tablets (12.5 mg total) by mouth 2 (two) times daily. (Patient taking differently: Take 12.5 mg by mouth 3 (three) times daily. )   07/07/2018 at 8-900  . nicotine (NICODERM CQ - DOSED IN MG/24 HOURS) 21 mg/24hr patch Place 1 patch (21 mg total) onto the skin daily. 28 patch 0 07/07/2018 at 1345  . ondansetron (ZOFRAN) 4 MG tablet Take 4 mg by mouth every 4 (four) hours as needed for nausea or vomiting.   07/07/2018 at 1345  . potassium chloride (K-DUR) 10 MEQ tablet Take 10 mEq by mouth daily.   07/07/2018 at Unknown time  . pregabalin (LYRICA) 200 MG capsule Take 1 capsule by mouth 3 (three) times daily.   07/07/2018 at Unknown time  . senna-docusate (SENOKOT-S) 8.6-50 MG  tablet Take 2 tablets by mouth at bedtime as needed for mild constipation or moderate constipation. (Patient taking differently: Take 1 tablet by mouth daily. )   07/07/2018 at Unknown time  . tiZANidine (ZANAFLEX) 4 MG tablet Take 1-2 tablets (4-8 mg total) by mouth every 8 (eight) hours as needed for muscle spasms. 90 tablet 1 07/07/2018 at 1500   Scheduled:  . atorvastatin  40 mg Oral Daily  . Chlorhexidine Gluconate Cloth  6 each Topical Q0600  . diltiazem  30 mg Oral Q6H  . DULoxetine  60 mg Oral Daily  . feeding supplement (PRO-STAT SUGAR FREE 64)  30 mL Oral BID  . fenofibrate  160 mg Oral Daily  . heparin  4,000 Units Intravenous Once  . lidocaine  1 patch Transdermal Q24H  . metoprolol tartrate  25 mg Oral BID  . nicotine  21 mg Transdermal Daily  . pantoprazole  40 mg Oral Q1200  . potassium chloride  10 mEq Oral Daily  . potassium chloride  40 mEq Oral Once   . pregabalin  200 mg Oral TID   Infusions:  . sodium chloride 100 mL (07/09/18 0639)  . ceFEPime (MAXIPIME) IV 2 g (07/09/18 16100637)  . heparin    . potassium chloride     PRN: sodium chloride, acetaminophen **OR** acetaminophen, alum & mag hydroxide-simeth, ipratropium-albuterol, ondansetron, senna-docusate, tiZANidine Anti-infectives (From admission, onward)   Start     Dose/Rate Route Frequency Ordered Stop   07/08/18 2300  vancomycin (VANCOCIN) 1,500 mg in sodium chloride 0.9 % 500 mL IVPB  Status:  Discontinued     1,500 mg 250 mL/hr over 120 Minutes Intravenous  Once 07/07/18 2128 07/07/18 2129   07/08/18 2300  vancomycin (VANCOCIN) 1,500 mg in sodium chloride 0.9 % 500 mL IVPB  Status:  Discontinued     1,500 mg 250 mL/hr over 120 Minutes Intravenous Every 24 hours 07/07/18 2129 07/08/18 0726   07/08/18 0600  ceFEPIme (MAXIPIME) 2 g in sodium chloride 0.9 % 100 mL IVPB     2 g 200 mL/hr over 30 Minutes Intravenous Every 8 hours 07/07/18 2124     07/07/18 2130  vancomycin (VANCOCIN) 1,500 mg in sodium chloride 0.9 % 500 mL IVPB     1,500 mg 250 mL/hr over 120 Minutes Intravenous  Once 07/07/18 2123 07/08/18 0028   07/07/18 2115  vancomycin (VANCOCIN) IVPB 1000 mg/200 mL premix  Status:  Discontinued     1,000 mg 200 mL/hr over 60 Minutes Intravenous  Once 07/07/18 2110 07/07/18 2128   07/07/18 2115  ceFEPIme (MAXIPIME) 2 g in sodium chloride 0.9 % 100 mL IVPB     2 g 200 mL/hr over 30 Minutes Intravenous  Once 07/07/18 2110 07/07/18 2203      Assessment: [name] a 73 y.o. female requires anticoagulation with a heparin iv infusion for the indication of  atrial fibrillation. Heparin gtt will be started following pharmacy protocol per pharmacy consult. Patient is on previous oral anticoagulant that will require aPTT/HL correlation before transitioning to only HL monitoring.   Goal of Therapy:  Target aPTT 50-90 seconds Heparin level 0.3-0.7 units/ml Monitor platelets  by anticoagulation protocol: Yes   Plan:  APTT in 6 hours  Give 4000 units bolus x 1 Start heparin infusion at 1000 units/hr Check anti-Xa level in 6 hours and daily while on heparin Continue to monitor H&H and platelets  Heparin level to be drawn in 6 hours  Alyssabeth Bruster 07/09/2018,7:25 AM

## 2018-07-09 NOTE — Evaluation (Signed)
Physical Therapy Evaluation Patient Details Name: [name] MRN: [id number] DOB: 07/21/1945 Today's Date: 07/09/2018   History of Present Illness   [name] is a 73 y.o. female with medical history significant of chronic back pain, GERD, hyperlipidemia, hypertension, left knee osteoarthritis, palpitations, PVD h/o Aortofem bypass, s/p RLE thromboembolectomy and R common emoral artery patch angioplasty 05/31/18, history of tobacco use who presents to ER due to fall at home.  She just was discharged from [facility] yesterday.    Clinical Impression  Patient functioning near baseline for functional mobility and gait, limited mostly due to HR increasing above 170 BPM with exertion and limited to activity at bedside.  Patient tolerated sitting up in chair with nursing staff present in room after therapy.  Patient will benefit from continued physical therapy in hospital and recommended venue below to increase strength, balance, endurance for safe ADLs and gait.     Follow Up Recommendations Home health PT;Supervision - Intermittent;Supervision for mobility/OOB    Equipment Recommendations  None recommended by PT    Recommendations for Other Services       Precautions / Restrictions Precautions Precautions: Fall Restrictions Weight Bearing Restrictions: No      Mobility  Bed Mobility Overal bed mobility: Needs Assistance Bed Mobility: Supine to Sit     Supine to sit: Supervision     General bed mobility comments: increased time  Transfers Overall transfer level: Needs assistance Equipment used: Rolling walker (2 wheeled) Transfers: Sit to/from [employer] Sit to Stand: Supervision Stand pivot transfers: Supervision       General transfer comment: slow labored movement  Ambulation/Gait Ambulation/Gait assistance: Min guard Gait Distance (Feet): 5 Feet Assistive device: Rolling walker (2 wheeled) Gait Pattern/deviations: Decreased step length  - left;Decreased stride length;Decreased step length - right Gait velocity: slow   General Gait Details: patient limited to a 5-6 steps at bedside without loss of balance due to increased HR above 170 BPM  Stairs            Wheelchair Mobility    Modified Rankin (Stroke Patients Only)       Balance Overall balance assessment: Needs assistance Sitting-balance support: Feet supported;No upper extremity supported Sitting balance-Leahy Scale: Good     Standing balance support: During functional activity;No upper extremity supported Standing balance-Leahy Scale: Fair Standing balance comment: fair/good using RW                             Pertinent Vitals/Pain Pain Assessment: 0-10 Pain Score: 5  Pain Location: RLE Pain Descriptors / Indicators: Sore;Discomfort Pain Intervention(s): Limited activity within patient's tolerance;Monitored during session    Home Living Family/patient expects to be discharged to:: Private residence Living Arrangements: Alone Available Help at Discharge: Family;Friend(s) Type of Home: House Home Access: Level entry     Home Layout: One level Home Equipment: Environmental consultant - 2 wheels      Prior Function Level of Independence: Independent with assistive device(s)         Comments: household and short distanced community using RW, drives     Hand Dominance   Dominant Hand: Right    Extremity/Trunk Assessment   Upper Extremity Assessment Upper Extremity Assessment: Generalized weakness    Lower Extremity Assessment Lower Extremity Assessment: Generalized weakness    Cervical / Trunk Assessment Cervical / Trunk Assessment: Normal  Communication   Communication: No difficulties  Cognition Arousal/Alertness: Awake/alert Behavior During Therapy: WFL for tasks  assessed/performed Overall Cognitive Status: Within Functional Limits for tasks assessed                                        General  Comments      Exercises     Assessment/Plan    PT Assessment Patient needs continued PT services  PT Problem List Decreased strength;Decreased activity tolerance;Decreased balance;Decreased mobility       PT Treatment Interventions Therapeutic exercise;Stair training;Functional mobility training;Therapeutic activities;Patient/family education;Gait training    PT Goals (Current goals can be found in the Care Plan section)  Acute Rehab PT Goals Patient Stated Goal: return home with family to assist PT Goal Formulation: With patient Time For Goal Achievement: 07/16/18 Potential to Achieve Goals: Good    Frequency Min 3X/week   Barriers to discharge        Co-evaluation               AM-PAC PT "6 Clicks" Mobility  Outcome Measure Help needed turning from your back to your side while in a flat bed without using bedrails?: None Help needed moving from lying on your back to sitting on the side of a flat bed without using bedrails?: None Help needed moving to and from a bed to a chair (including a wheelchair)?: A Little Help needed standing up from a chair using your arms (e.g., wheelchair or bedside chair)?: None Help needed to walk in hospital room?: A Little Help needed climbing 3-5 steps with a railing? : A Lot 6 Click Score: 20    End of Session   Activity Tolerance: Patient tolerated treatment well;Patient limited by fatigue(Patient limited by increasing HR) Patient left: in chair;with call bell/phone within reach;with nursing/sitter in room Nurse Communication: Mobility status PT Visit Diagnosis: Unsteadiness on feet (R26.81);Other abnormalities of gait and mobility (R26.89);Muscle weakness (generalized) (M62.81)    Time: [id number] PT Time Calculation (min) (ACUTE ONLY): 32 min   Charges:   PT Evaluation $PT Eval Moderate Complexity: 1 Mod PT Treatments $Therapeutic Activity: 23-37 mins        10:45 AM, 07/09/18 Ocie BobJames Tanveer Dobberstein, MPT Physical Therapist  with [facility] 336 [phone] office [phone] mobile phone

## 2018-07-09 NOTE — Plan of Care (Signed)
  Problem: Acute Rehab PT Goals(only PT should resolve) Goal: Pt Will Go Supine/Side To Sit Outcome: Progressing Flowsheets (Taken 07/09/2018 1046) Pt will go Supine/Side to Sit: with modified independence Goal: Patient Will Transfer Sit To/From Stand Outcome: Progressing Flowsheets (Taken 07/09/2018 1046) Patient will transfer sit to/from stand: with modified independence Goal: Pt Will Transfer Bed To Chair/Chair To Bed Outcome: Progressing Flowsheets (Taken 07/09/2018 1046) Pt will Transfer Bed to Chair/Chair to Bed: with supervision Goal: Pt Will Ambulate Outcome: Progressing Flowsheets (Taken 07/09/2018 1046) Pt will Ambulate: 50 feet; with supervision; with rolling walker   10:47 AM, 07/09/18 Ocie Bob, MPT Physical Therapist with [facility] [phone] office [phone] mobile phone

## 2018-07-09 NOTE — Progress Notes (Signed)
Pharmacy Antibiotic Note  [name] is a 73 y.o. female admitted on 07/07/2018 with pneumonia.  Pharmacy has been consulted for Cefepime dosing.  Plan: Cefepime 2000 mg IV every 8 hours. Vancomycin 1500 mg IV every 24 hours.  Goal trough 15-20 mcg/mL.  Monitor labs, c/s, and vanco levels as indicated.  Height: 5\' 7"  (170.2 cm) Weight: 147 lb 0.8 oz (66.7 kg) IBW/kg (Calculated) : 61.6  Temp (24hrs), Avg:98.8 F (37.1 C), Min:98.6 F (37 C), Max:99.1 F (37.3 C)  Recent Labs  Lab 07/07/18 1845 07/07/18 2002 07/07/18 2148 07/08/18 0052 07/08/18 0406 07/09/18 0536  WBC 27.4*  --   --   --  26.6* 23.2*  CREATININE 0.76  --   --   --  0.48 0.51  LATICACIDVEN  --  2.3* 1.6 1.4 1.3 1.1    Estimated Creatinine Clearance: 62.7 mL/min (by C-G formula based on SCr of 0.51 mg/dL).    No Known Allergies  Antimicrobials this admission: Cefepime 4/7 >>  Vanco 4/7 >> 4/8  Dose adjustments this admission: N/A  Microbiology results: 4/7 BCx: ng x 2 days  4/8 mrsa pcr negative    Thank you for allowing pharmacy to be a part of this patient's care.  Gerre Pebbles Adana Marik 07/09/2018 8:04 AM

## 2018-07-09 NOTE — Consult Note (Signed)
WOC Nurse wound consult note Reason for Consult:Left great and second toe necrotic since surgery and subsequent PVD.  Dry gangrene present.   Eliquis on hold and following up with vascular she states.  Wound type:vascular Pressure Injury POA: NA Wound EUM:PNTI of toes  Drainage (amount, consistency, odor) dry  Necrotic odor Periwound:intact Dressing procedure/placement/frequency:Leave toes open to air.  Dry dressing if patient prefers. Will not follow at this time.  Please re-consult if needed.  Maple Hudson MSN, RN, [name]-BC CWON Wound, Ostomy, Continence Nurse Pager [phone]

## 2018-07-09 NOTE — Progress Notes (Signed)
ANTICOAGULATION CONSULT NOTE   Pharmacy Consult for heparin  Indication: atrial fibrillation  No Known Allergies  Patient Measurements: Height: 5\' 7"  (170.2 cm) Weight: 147 lb 0.8 oz (66.7 kg) IBW/kg (Calculated) : 61.6 Heparin Dosing Weight: HEPARIN DW (KG): 66.1   Vital Signs: Temp: 98.2 F (36.8 C) (04/09 2000) Temp Source: Oral (04/09 2000) BP: 144/78 (04/09 2300) Pulse Rate: 78 (04/09 2300)  Labs: Recent Labs    07/07/18 1845 07/07/18 1846 07/08/18 0406 07/09/18 0536 07/09/18 1340 07/09/18 2239  HGB 10.9*  --  9.9* 10.5*  --   --   HCT 35.7*  --  33.0* 35.0*  --   --   PLT 424*  --  376 441*  --   --   APTT  --  41*  --   --  69* 66*  LABPROT  --  19.9*  --   --   --   --   INR  --  1.7*  --   --   --   --   HEPARINUNFRC  --   --   --   --  1.88*  --   CREATININE 0.76  --  0.48 0.51  --   --   CKTOTAL 30*  --   --   --   --   --   TROPONINI <0.03  --   --   --   --   --     Estimated Creatinine Clearance: 62.7 mL/min (by C-G formula based on SCr of 0.51 mg/dL).   Assessment: 73 y.o. female with h/o Afib, Eliquis on hold, for heparin  Goal of Therapy:  aPTT 66-102 seconds Heparin level 0.3-0.7 units/ml Monitor platelets by anticoagulation protocol: Yes   Plan:  Continue Heparin at current rate  Follow-up am labs.   Eddie Candle 07/09/2018,11:54 PM

## 2018-07-09 NOTE — Progress Notes (Signed)
Subjective:  Hurt last night. Some left lower abd pain relieved with gas. Last BM two days ago. No N/V. Doesn't like breakfast this morning.   Objective: Vital signs in last 24 hours: Temp:  [98.6 F (37 C)-99.1 F (37.3 C)] 98.6 F (37 C) (04/09 0405) Pulse Rate:  [93-124] 110 (04/09 0600) Resp:  [15-29] 26 (04/09 0600) BP: (111-160)/(52-98) 134/80 (04/09 0600) SpO2:  [91 %-100 %] 93 % (04/09 0600) Weight:  [66.7 kg] 66.7 kg (04/09 0500) Last BM Date: 07/07/18 General:   Alert,  Well-developed, well-nourished, pleasant and cooperative in NAD Head:  Normocephalic and atraumatic. Eyes:  Sclera clear, no icterus.   Abdomen:  Soft, nontender and nondistended.  Normal bowel sounds, without guarding, and without rebound.   Extremities:  Without clubbing,   Edema. Left great toe and second toe black, dry gangrene (per patient present since surgery 05/2018). Neurologic:  Alert and  oriented x4;  grossly normal neurologically. Skin:  Intact without significant lesions or rashes. Psych:  Alert and cooperative. Normal mood and affect.  Intake/Output from previous day: 04/08 0701 - 04/09 0700 In: 848.8 [I.V.:541.6; IV Piggyback:307.2] Out: 2900 [Urine:2900] Intake/Output this shift: No intake/output data recorded.  Lab Results: CBC Recent Labs    07/07/18 1845 07/08/18 0406 07/09/18 0536  WBC 27.4* 26.6* 23.2*  HGB 10.9* 9.9* 10.5*  HCT 35.7* 33.0* 35.0*  MCV 84.6 86.4 84.7  PLT 424* 376 441*   BMET Recent Labs    07/07/18 1845 07/08/18 0406 07/09/18 0536  NA 132* 138 133*  K 3.4* 3.8 2.5*  CL 100 109 102  CO2 22 22 21*  GLUCOSE 166* 130* 94  BUN 12 10 12   CREATININE 0.76 0.48 0.51  CALCIUM 8.3* 8.1* 8.1*   LFTs Recent Labs    07/07/18 1845 07/08/18 0406 07/09/18 0536  BILITOT 0.9 0.8 0.8  ALKPHOS 150* 131* 134*  AST 50* 41 44*  ALT 21 18 18   PROT 6.3* 5.6* 5.9*  ALBUMIN 2.1* 1.9* 1.9*   Recent Labs    07/08/18 1325  LIPASE 65*   PT/INR Recent Labs    07/07/18 1846  LABPROT 19.9*  INR 1.7*      Imaging Studies: Ct Head Wo Contrast  Result Date: 07/07/2018 CLINICAL DATA:  Fall today.  Head injury. EXAM: CT HEAD WITHOUT CONTRAST TECHNIQUE: Contiguous axial images were obtained from the base of the skull through the vertex without intravenous contrast. COMPARISON:  CT head 05/30/2018 FINDINGS: Brain: Ventricle size normal. Small hypodensity in the corona radiata on the right is unchanged compatible with chronic ischemia. No acute infarct, hemorrhage, or mass lesion. Vascular: Negative for hyperdense vessel Skull: Negative for fracture Sinuses/Orbits: Chronic mucoperiosteal thickening of the left sphenoid sinus. 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   On: 07/07/2018 20:01   Dg Chest Portable 1 View  Result Date: 07/07/2018 CLINICAL DATA:  Pain following fall EXAM: PORTABLE CHEST 1 VIEW COMPARISON:  June 03, 2018 FINDINGS: Atelectatic change in the medial right base noted. Lungs elsewhere clear. Heart size and pulmonary vascularity are normal. No adenopathy. No pneumothorax. No bone lesions. Thoracic stimulator lead tips are in the lower thoracic region. IMPRESSION: Atelectasis medial right base. Early pneumonia in this area cannot be excluded. Lungs elsewhere clear. Stable cardiac silhouette. Electronically Signed   By:  Bretta Bang III M.D.   On: 07/07/2018 19:59   US Abdomen Limited Ruq  Result Date: 07/08/2018 CLINICAL DATA:  Abnormal liver function test. EXAM: ULTRASOUND ABDOMEN LIMITED RIGHT UPPER QUADRANT COMPARISON:  None. FINDINGS: Gallbladder: Significant sludge within the gallbladder, but no shadowing stones, no wall thickening and no pericholecystic fluid. Common bile duct: Diameter: 5 mm Liver: No focal lesion  identified. Within normal limits in parenchymal echogenicity. Portal vein is patent on color Doppler imaging with normal direction of blood flow towards the liver. IMPRESSION: 1. No acute findings.  No acute cholecystitis or bile duct dilation. 2. Gallbladder sludge without stones. 3. Normal sonographic appearance of the liver. Electronically Signed   By: Amie Portland M.D.   On: 07/08/2018 10:23  [2 weeks]   Assessment: 73 year old female with multiple comorbidities presenting to the emergency department after a fall at home.  Patient reports episodes of presyncope which is been ongoing.  Concerns for possible sepsis related to possible pneumonia on admission.  Blood cultures pending.  Urinalysis unremarkable.  Currently on cefepime.  We were asked see the patient regarding chronic nausea, poor appetite and weight loss.  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  Patient has a history of remote aortobifemoral bypass.  Developed acute ischemia of the right lower extremity and required right lower extremity thromboembolectomy, right common femoral artery patch angioplasty on May 31, 2018. DRY GANGRENE NOTED LEFT GREAT TOE/SECOND DIGIT. Patient reports present since surgery. Has not followed up with vascular yet.   Inflammatory changes of the tail the pancreas most consistent with acute pancreatitis along with a 3.2 x 3.2 cm lesion most consistent with pseudocyst, neoplastic process cannot be excluded but felt to be unlikely.  Findings found incidentally on CT angio 6 weeks ago.  Cannot rule out ischemic process as etiology.  Leukocytosis improving.  White blood cell count 23,200.  White blood cell count got up into the 35,000 range back in March postoperatively.  Hemoglobin nadired to 7.9.  Hemoglobin currently stable at 10.5.  From normal.  Hypokalemia-potassium runs ordered  A. Fib - new onset., now on heparin  drip.  She was on Eliquis for peripheral vascular disease which we stopped yesterday in preparation for EGD.  Plan: 1. Plans for EGD tomorrow.  Need to hold heparin drip prior to procedure. 2. Clear liquids at 8pm, NPO after Midnight. 3. Replete potassium. 4. Continue PPI. 5. Continue supportive measures.  Leanna Battles. [facility] [phone] 4/9/20208:26 AM     LOS: 2 days

## 2018-07-09 NOTE — TOC Progression Note (Signed)
Transition of Care [facility]) - Progression Note    Patient Details  Name: [name] MRN: [id number] Date of Birth: 12-Feb-1946  Transition of Care [facility]) CM/SW Contact  Theda Belfast Era Skeen, RN Phone Number: 07/09/2018, 11:28 AM  Clinical Narrative:   CM spoke with daughter [name] regarding DC plan. Per PT pt okay to return home with [facility], pt planning to stay with daughters. Pt walked 5 feet with minimum assistance. Per daughter they do not want patient returning to [facility], feels she was discharged in poor condition. Daughter admits they had been considering pt staying at her home after DC. [name] now concerned about pt's potential exposure to COVID-19 from being in the hospital and taking it back to her small children and husband who has diabetes. After mush discussion daughter does seem to still plan for DC home with [facility]. CM left phone number for daughter to call with final decision. [name] will discuss with pt via phone today. [name], [facility] rep, updated on PT's recommendations.      Expected Discharge Plan: Home w Home Health Services Barriers to Discharge: Continued Medical Work up  Expected Discharge Plan and Services Expected Discharge Plan: Home w Home Health Services       Living arrangements for the past 2 months: Skilled Nursing Facility, Single Family Home                       HH Agency: [facility]

## 2018-07-10 ENCOUNTER — Encounter (HOSPITAL_COMMUNITY)
Admission: EM | Disposition: A | Discharge status: Hospice | Payer: Self-pay | Source: Home / Self Care | Attending: Internal Medicine

## 2018-07-10 LAB — PROCALCITONIN: Procalcitonin: 0.76 ng/mL

## 2018-07-10 LAB — BASIC METABOLIC PANEL
Anion gap: 12 (ref 5–15)
BUN: 8 mg/dL (ref 8–23)
CO2: 20 mmol/L — ABNORMAL LOW (ref 22–32)
Calcium: 8.5 mg/dL — ABNORMAL LOW (ref 8.9–10.3)
Chloride: 102 mmol/L (ref 98–111)
Creatinine, Ser: 0.54 mg/dL (ref 0.44–1.00)
GFR calc Af Amer: >60 mL/min (ref >60)
GFR calc non Af Amer: >60 mL/min (ref >60)
Glucose, Bld: 110 mg/dL — ABNORMAL HIGH (ref 70–99)
Potassium: 3.3 mmol/L — ABNORMAL LOW (ref 3.5–5.1)
Sodium: 134 mmol/L — ABNORMAL LOW (ref 135–145)

## 2018-07-10 LAB — APTT
aPTT: 61 seconds — ABNORMAL HIGH (ref 24–36)
aPTT: 36 seconds (ref 24–36)
aPTT: 69 seconds — ABNORMAL HIGH (ref 24–36)

## 2018-07-10 LAB — CBC
HCT: 35.3 % — ABNORMAL LOW (ref 36.0–46.0)
Hemoglobin: 10.8 g/dL — ABNORMAL LOW (ref 12.0–15.0)
MCH: 25.8 pg — ABNORMAL LOW (ref 26.0–34.0)
MCHC: 30.6 g/dL (ref 30.0–36.0)
MCV: 84.2 fL (ref 80.0–100.0)
Platelets: 474 10*3/uL — ABNORMAL HIGH (ref 150–400)
RBC: 4.19 MIL/uL (ref 3.87–5.11)
RDW: 18.3 % — ABNORMAL HIGH (ref 11.5–15.5)
WBC: 25.6 10*3/uL — ABNORMAL HIGH (ref 4.0–10.5)
nRBC: 0 % (ref 0.0–0.2)

## 2018-07-10 LAB — PROTIME-INR
INR: 1.3 — ABNORMAL HIGH (ref 0.8–1.2)
Prothrombin Time: 16.1 seconds — ABNORMAL HIGH (ref 11.4–15.2)

## 2018-07-10 LAB — HEPARIN LEVEL (UNFRACTIONATED)
Heparin Unfractionated: 1.02 IU/mL — ABNORMAL HIGH (ref 0.30–0.70)
Heparin Unfractionated: 0.61 IU/mL (ref 0.30–0.70)

## 2018-07-10 LAB — MAGNESIUM: Magnesium: 1.7 mg/dL (ref 1.7–2.4)

## 2018-07-10 SURGERY — EGD (ESOPHAGOGASTRODUODENOSCOPY)
Anesthesia: Moderate Sedation

## 2018-07-10 MED ORDER — POTASSIUM CHLORIDE 10 MEQ/100ML IV SOLN
10.0000 meq | INTRAVENOUS | Status: AC
  Administered 2018-07-10 (×4): 10 meq via INTRAVENOUS
  Filled 2018-07-10 (×4): qty 100

## 2018-07-10 NOTE — Progress Notes (Signed)
Physical Therapy Treatment Patient Details Name: Danielle FilbertLinda S Mcsorley MRN: [id number] DOB: [birth year] Today's Date: 07/10/2018    History of Present Illness  [name] is a 73 y.o. female with medical history significant of chronic back pain, GERD, hyperlipidemia, hypertension, left knee osteoarthritis, palpitations, PVD h/o Aortofem bypass, s/p RLE thromboembolectomy and R common emoral artery patch angioplasty 05/31/18, history of tobacco use who presents to ER due to fall at home.  [facility]    PT Comments    Pt supine in bed and willing to participate with therapy today.  Pt c/o nausea through session, RN aware and medication given prior treatment.   Pt able to keep O2 saturation WNL on room air, though reports lightheadedness once removed nasal canal with O2 saturation range from 93-96% on room air.  Reapplied nasal canal per symptoms and able to complete all transfer and gait training without c/o.  Pt with bowel movement during gait, [facility] and left in chair with call bell within reach and NT in room.  Average heart rate around 98bpm, did increase to 118 during STS and around 110 during gait.     Follow Up Recommendations  Home health PT;Supervision - Intermittent;Supervision for mobility/OOB     Equipment Recommendations  None recommended by PT    Recommendations for Other Services       Precautions / Restrictions Precautions Precautions: Fall Restrictions Weight Bearing Restrictions: No    Mobility  Bed Mobility Overal bed mobility: Modified Independent Bed Mobility: Supine to Sit     Supine to sit: Supervision     General bed mobility comments: increased time  Transfers Overall transfer level: Modified independent Equipment used: Rolling walker (2 wheeled) Transfers: Sit to/from Stand;Stand Pivot Transfers([facility] and [facility]) Sit to Stand: Supervision Stand pivot transfers: Supervision        General transfer comment: slow labored movement  Ambulation/Gait Ambulation/Gait assistance: Min guard Gait Distance (Feet): 8 Feet Assistive device: Rolling walker (2 wheeled) Gait Pattern/deviations: Decreased step length - left;Decreased stride length;Decreased step length - right Gait velocity: slow   General Gait Details: HR range from 89-118 bpm during transfer and gait training   Stairs             Wheelchair Mobility    Modified Rankin (Stroke Patients Only)       Balance                                            Cognition Arousal/Alertness: Awake/alert Behavior During Therapy: WFL for tasks assessed/performed Overall Cognitive Status: Within Functional Limits for tasks assessed                                        Exercises      General Comments        Pertinent Vitals/Pain Pain Assessment: No/denies pain    Home Living                      Prior Function            PT Goals (current goals can now be found in the care plan section)      Frequency    Min 3X/week  PT Plan      Co-evaluation              AM-PAC PT "6 Clicks" Mobility   Outcome Measure  Help needed turning from your back to your side while in a flat bed without using bedrails?: None Help needed moving from lying on your back to sitting on the side of a flat bed without using bedrails?: None Help needed moving to and from a bed to a chair (including a wheelchair)?: A Little Help needed standing up from a chair using your arms (e.g., wheelchair or bedside chair)?: None Help needed to walk in hospital room?: A Little Help needed climbing 3-5 steps with a railing? : A Lot 6 Click Score: 20    End of Session Equipment Utilized During Treatment: Gait belt;Oxygen(nasal canal) Activity Tolerance: Patient tolerated treatment well;Patient limited by fatigue Patient left: in chair;with call bell/phone within  reach;with nursing/sitter in room Nurse Communication: Mobility status PT Visit Diagnosis: Unsteadiness on feet (R26.81);Other abnormalities of gait and mobility (R26.89);Muscle weakness (generalized) (M62.81)     Time: 3568-6168 PT Time Calculation (min) (ACUTE ONLY): 25 min  Charges:  $Therapeutic Activity: 23-37 mins                     [address] LPTA; CBIS [phone]   Juel Burrow 07/10/2018, 11:44 AM

## 2018-07-10 NOTE — Progress Notes (Deleted)
Heparin dri[

## 2018-07-10 NOTE — Progress Notes (Signed)
MD Fields at bedside

## 2018-07-10 NOTE — Progress Notes (Signed)
ANTICOAGULATION CONSULT NOTE   Pharmacy Consult for heparin  Indication: atrial fibrillation  No Known Allergies  Patient Measurements: Height: 5\' 7"  (170.2 cm) Weight: 142 lb 6.7 oz (64.6 kg) IBW/kg (Calculated) : 61.6 Heparin Dosing Weight: HEPARIN DW (KG): 66.1   Vital Signs: Temp: 98 F (36.7 C) (04/10 0400) Temp Source: Oral (04/10 0400) BP: 142/78 (04/10 0800) Pulse Rate: 97 (04/10 0800)  Labs: Recent Labs    07/07/18 1845  07/07/18 1846 07/08/18 0406 07/09/18 0536 07/09/18 1340 07/09/18 2235 07/09/18 2239 07/10/18 0536  HGB 10.9*  --   --  9.9* 10.5*  --   --   --  10.8*  HCT 35.7*  --   --  33.0* 35.0*  --   --   --  35.3*  PLT 424*  --   --  376 441*  --   --   --  474*  APTT  --    < > 41*  --   --  69*  --  66* 61*  LABPROT  --   --  19.9*  --   --   --   --   --   --   INR  --   --  1.7*  --   --   --   --   --   --   HEPARINUNFRC  --   --   --   --   --  1.88* 1.30*  --  1.02*  CREATININE 0.76  --   --  0.48 0.51  --   --   --  0.54  CKTOTAL 30*  --   --   --   --   --   --   --   --   TROPONINI <0.03  --   --   --   --   --   --   --   --    < > = values in this interval not displayed.    Estimated Creatinine Clearance: 62.7 mL/min (by C-G formula based on SCr of 0.54 mg/dL).   Assessment: 73 y.o. female with h/o Afib, Eliquis on hold, for heparin  Goal of Therapy:  aPTT 66-102 seconds Heparin level 0.3-0.7 units/ml Monitor platelets by anticoagulation protocol: Yes   Plan:  Increase heparin to 1100 units/hr for subtherapeutic aPTT of 61  Follow-up am labs.   Jeovanni Heuring 07/10/2018,8:11 AM

## 2018-07-10 NOTE — Progress Notes (Signed)
Assessment/Plan: ADMITTED WITH PERSISTENT NAUSEA/VOMITING/ABDOMINAL PAIN DUE TO ISCHEMIC PANCREATITIS/COLITIS AND MILD SB ILEUS ON CT APR 9.  PLAN:  1. 1017: I PERSONALLY REVIEWED CT APR 9 DR.HOSS(IR). 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 HE WILL ARRANGE TO HAVE STENT PLACED MON AT [facility]. SHE WILL NEED AMBULANCE TRANSFER AND IT IS OK TO RETURN RO [facility]. 2. OK TO TRIAL FULL LIQUIDS 3. CONTINUE TO MONITOR SYMPTOMS/SIGNS OF BOWEL INFARCT. 4. CONTINUE HEPARIN GTT FOR MESENTERIC ISCHEMIA/A FIB. 5. CONTINUE CEFEPIME.  NO GI COVERAGE AFTER 5 PM APR 10. COVERAGE RESUMES MON APR 13 _0 .  GREATER THAN 50% WAS SPENT IN COUNSELING & COORDINATION OF CARE WITH THE PATIENT: DISCUSSED DIFFERENTIAL DIAGNOSIS, PROCEDURE, BENEFITS, RISKS, AND MANAGEMENT OF MESENTERIC ISCHEMIA. TOTAL ENCOUNTER TIME: 40 MINS.    Subjective: PT HAS BEEN EXPERIENCING NAUSEA/VOMITING/GAGGING 10 MINS AFTER SHE EATS FOR THE PAST 5-6 WEEKS. SHE IS ABLE TO TOLERATE CREAM OF POTATOES, MAC & CHEESE, AND GRILLED CHEESE. HER APPETITE HAS BEEN THE SAME OVER THE LAST 5-6 WEEKS BUT DOESN'T LIKE TO EAT ANYTHING BECAUSE OF THE NAUSEA/VOMITING. SHE HAS DOCUMENTED WEIGHT LOSS FROM 172 LBS(MAY 2019) TO 142 LBS IN APR 2020. SHE HAS MILD/VAGUE ABDOMINAL PAIN WHICH SHE DESCRIBES AS SORE  TO TOUCH IN THE LOWER AND UPPER ABDOMEN. SHE HAS DIARRHEA THAT STARTED TODAY. SHE QUIT SMOKING 5-6 WEEKS AGO BUT HAS A 60 PK-YR HISTORY. SHE HAD A AORTO-FEM BYPASS AT [facility] YEARS AGO. TODAY SHE THREW UP AFTER TAKING HER PILLS, SHE LAST HAD SOLID FOOD ~ 2 WEEKS AGO. DURING THIS HOSPITAL STAY SHE DEVELOPS NEW ONSET AFIB AND IS NOW ON A HEPARIN GTT.  PT DENIES FEVER, CHILLS, HEMATOCHEZIA, HEMATEMESIS, melena, CHEST PAIN, SHORTNESS OF BREATH, constipation, problems swallowing, OR heartburn or indigestion.  Objective: Vital signs in last 24 hours: Vitals:   07/10/18 1147 07/10/18 1200  BP: (!) 142/66 135/71  Pulse:  68  Resp:  18   Temp:    SpO2:  96%   General appearance: alert, cooperative and mild distress Resp: clear to auscultation bilaterally Cardio: irregularly irregular rhythm GI: soft, MODERATE tenderness in LUQ, & RLQ, AND MILDLY TENDER IN EPIGASTRIUM, NO REBOUND OR GUARDING; bowel sounds normal; no masses OR BRUITS Extremities: extremities normal, atraumatic, no edema NEURO: NO  NEW FOCAL DEFICITS  Lab Results: WBC 25.6 Hb 10.8 PLT CT 474  K 3.3 Cr 0.54 LACTIC ACID 1.1   Studies/Results: Ct Abdomen Pelvis W Wo Contrast  Result Date: 07/09/2018 CLINICAL DATA:  Generalized weakness with nausea and left lower abdominal pain. Known mesenteric ischemia pancreatitis. EXAM: CT ABDOMEN AND PELVIS WITHOUT AND WITH CONTRAST TECHNIQUE: Multidetector CT imaging of the abdomen and pelvis was performed following the standard protocol before and following the bolus administration of intravenous contrast. CONTRAST:  123m OMNIPAQUE IOHEXOL 300 MG/ML  SOLN COMPARISON:  05/27/2018 FINDINGS: Lower chest: Dependent atelectasis noted posterior right lower lobe. Hepatobiliary: 4 mm low-density lesion medial segment left liver too small to characterize but likely benign etiology such as cyst. There is no evidence for gallstones, gallbladder wall thickening, or pericholecystic fluid. No intrahepatic or extrahepatic biliary dilation. Pancreas: Interval organization of the fluid collection seen previously the pancreatic tail. Dominant component in the pancreatic tail demonstrates rim enhancement and measures 2.5 x 3.3 cm (36/4). This fluid than dissects posteriorly into the left perirenal space and is contiguous with a 3.1 x 1.0 cm rim enhancing fluid collection along the anterior margin of the upper pole left kidney. Peripancreatic edema/inflammation in  the region of the pancreatic tail is evident. 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 Adrenals/Urinary Tract: No adrenal nodule or mass. 6 mm  nonobstructing stone identified interpolar right kidney. Mild right hydronephrosis is associated with distention of the proximal right ureter. Similar mild left hydroureteronephrosis evident. Right ureter transitions to normal diameter at the level of the right external iliac artery. Left ureter remains distended down to the left pelvic sidewall as well. Bladder is distended. Stomach/Bowel: Small hiatal hernia. Stomach otherwise unremarkable. Duodenum is normally positioned as is the ligament of Treitz. Small bowel loops are fluid-filled and distended up to 2.7 cm diameter. No discrete transition zone can be identified, but distal small bowel and terminal ileum are decompressed. The appendix is not visualized, but there is no edema or inflammation in the region of the cecum. Right colon is unremarkable. Transverse colon is distended. Distal transverse colon and splenic flexure shows circumferential wall thickening. Descending colon is abnormal, demonstrating pericolonic edema/inflammation. Lateral and posterior wall of the descending colon is thickened but anterior and medial wall is imperceptible. Distal descending colon into the sigmoid colon shows circumferential wall thickening again. There is fluid in the left paracolic gutter and edema/fluid tracks into the left omentum. 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 3.9 x 1.8 cm fluid collection is identified adjacent to the distal aspect of the right external iliac limb (119/4). 2.6 x 1.1 cm collection in the right groin may be a seroma. Celiac axis remains occluded. SMA is opacified. Portal vein is patent. Splenic vein is markedly attenuated but remains patent. The SMV is patent. Reproductive: Uterus surgically absent. 2.2 x 3.6 cm cystic lesion along the left pelvic sidewall is presumably ovarian and unchanged since prior study. Other: No  substantial intraperitoneal free fluid although there is a small amount of fluid in the para colic gutters bilaterally. No intraperitoneal free air. Musculoskeletal: No worrisome lytic or sclerotic osseous abnormality. Status post lumbosacral fusion. Spinal stimulator device evident. Intramuscular lipoma noted anterior aspect proximal right thigh. IMPRESSION: 1. Interval development of abnormal splenic flexure and descending colon. Circumferential wall thickening noted distal transverse colon and splenic flexure with proximal and mid descending colon showing posterior wall thickening but anterior colonic wall is imperceptible in this region. Distal descending colon into the sigmoid colon again shows circumferential wall thickening. There is prominent edema/inflammation around the proximal and mid descending colon with fluid/edema tracking into the left mesentery and pooling in the left paracolic gutter. Given celiac axis and IMA occlusion, ischemic disease a concern and watershed event would be a consideration. Infectious/inflammatory colitis also possibility. 2. Mild distention of proximal and mid small bowel with decompressed small bowel loops. Features probably related to ileus from the above described colonic pathology. 3. Interval evolution of pancreatic tail pseudocyst, now with organized, enhancing rim and evidence of extension into the perinephric space, along the anterior capsule of the upper left kidney. 4. Status post aortobifemoral bypass graft. Right iliac limb now opacifies. Small fluid collection identified around the distal aspect of the right external iliac limb with increased attenuation, suggesting hematoma. 5. Probable small seroma or hematoma in the right groin. These results will be called to the ordering clinician or representative by the Radiologist Assistant, and communication documented in the PACS or zVision Dashboard. Electronically Signed   By: Misty Stanley M.D.   On: 07/09/2018 19:41  Medications: I have reviewed the patient's current medications.

## 2018-07-10 NOTE — Progress Notes (Signed)
ANTICOAGULATION CONSULT NOTE   Pharmacy Consult for heparin  Indication: atrial fibrillation  No Known Allergies  Patient Measurements: Height: 5\' 7"  (170.2 cm) Weight: 142 lb 6.7 oz (64.6 kg) IBW/kg (Calculated) : 61.6 Heparin Dosing Weight: HEPARIN DW (KG): 66.1   Vital Signs: Temp: 98.1 F (36.7 C) (04/10 1620) Temp Source: Oral (04/10 1620) BP: 138/73 (04/10 1800) Pulse Rate: 71 (04/10 1800)  Labs: Recent Labs    07/08/18 0406 07/09/18 0536  07/09/18 2235  07/10/18 0536 07/10/18 1410 07/10/18 1830  HGB 9.9* 10.5*  --   --   --  10.8*  --   --   HCT 33.0* 35.0*  --   --   --  35.3*  --   --   PLT 376 441*  --   --   --  474*  --   --   APTT  --   --    < >  --    < > 61* 36 69*  LABPROT  --   --   --   --   --   --  16.1*  --   INR  --   --   --   --   --   --  1.3*  --   HEPARINUNFRC  --   --    < > 1.30*  --  1.02*  --  0.61  CREATININE 0.48 0.51  --   --   --  0.54  --   --    < > = values in this interval not displayed.    Estimated Creatinine Clearance: 62.7 mL/min (by C-G formula based on SCr of 0.54 mg/dL).   Assessment: 73 y.o. female with h/o Afib, Eliquis on hold, for heparin. APTT 69, therapeutic, HL 0.61, these levels may now begin to correlate. Last apixaban dose was 10am on 07/08/18.Hgb stable, plt stable.   Goal of Therapy:  aPTT 66-102 seconds Heparin level 0.3-0.7 units/ml Monitor platelets by anticoagulation protocol: Yes   Plan:  Continue heparin @ 1450 units/hr Follow-up am labs. HL and aPTT  Monitor for signs and symptoms of bleeding  Gerre Pebbles Tawfiq Favila 07/10/2018,7:24 PM

## 2018-07-10 NOTE — Progress Notes (Signed)
Aware of IR request for an image-guided mesenteric arteriogram with possible revascularization of superior mesenteric artery.  Procedure is tentatively scheduled for Monday, 07/13/2018 at 1200. Called patient's RN at [facility] who states patient can consent herself. Will consent at IR RN station at [facility] when patient arrives Monday. Will make NPO 07/13/2018 at [phone]. INR pending. Ok to proceed with procedure with continuous Heparin. [facility] RN aware to set up carelink to arrive to [facility] at 11 for 12 procedure time.  Please call IR with questions/concerns.  Waylan Boga Jove Beyl, PA-C 07/10/2018, 1:57 PM

## 2018-07-10 NOTE — Progress Notes (Signed)
PROGRESS NOTE    [name]  WUJ:[id number] DOB: [birth year] DOA: 07/07/2018 PCP: Junie Spencer, [name]   Brief Narrative:  Per HPI: [name] a 73 y.o.femalewith medical history significant ofchronic back pain, GERD, hyperlipidemia, hypertension, left knee osteoarthritis, palpitations, PVDh/o Aortofem bypass, s/p RLE thromboembolectomy and R common emoral artery patch angioplasty 05/31/18, history of tobacco usewho presents to ER due to fall at home. She just was discharged from [facility] yesterday.   Pt is very somnolent , and difficult to elicit history from.  Patient was admitted with generalized weakness secondary to suspected sepsis with questionable source of pneumonia. She was also noted to be hypotensive on admission, but this has improved.  She is noted to have significant GI issues to include loss of appetite and weight loss recently for which CT of the abdomen was performed on 4/9 demonstrating evolution of pancreatic tail pseudocyst as well as concerns for mesenteric ischemia.  Assessment & Plan:   Principal Problem:   Sepsis (HCC) Active Problems:   Hyperlipidemia with target LDL less than 100   Essential hypertension   Peripheral vascular disease (HCC)   Severe protein-calorie malnutrition (HCC)   Abnormal liver function   Loss of weight   Nausea without vomiting  1. Sepsis secondary to questionable pneumonia. Continue treatment on cefepime with blood culture results demonstrating no growth as of yet.Anticipate conversion to Augmentin prior to discharge.   Will maintain on gentle IV fluid.  Continues to have elevated leukocytosis on CBC. 2. Hypotension secondary to above-resolved. Continue on Cardizem and metoprolol as scheduled. 3. Poor oral intake due to intractable nausea and vomiting and abdominal pain with associated weight loss.  Suspected secondary to mesenteric ischemia and pancreatic pseudocyst.  Further recommendations per GI with  possible endoscopy. 4. Severe hypokalemia-improved.  Replete IV and oral today and recheck in a.m.  Maintain in stepdown unit for close telemetry monitoring. 5. PVD with dry gangrene.     Continue on Eliquis on discharge. 6. Dyslipidemia. Continue on fenofibrate and atorvastatin. 7. COPD with tobacco abuse. Duo nebs every 6 hours as needed and nicotine patch. Smoking cessation advised. 8. Essential hypertension. Continue double metoprolol dose as well as Cardizem with good heart rate and blood pressure control noted. 9. GERD. Maalox as needed.  Started on PPI. 10. Abnormal LFTs-stable.   Right upper quadrant ultrasound with no acute findings.Will need outpatient follow-up as she is on statin. 11. Fall at home with weakness. PT recommendations for home health on discharge. 12. Atrial fibrillation- likely new onset.  Maintain on heparin drip for now and 2D echocardiogram with LVEF 50 to 55%.  Cardizem added for additional rate control with good response noted.   DVT prophylaxis: Eliquis held for now; will place on heparin drip due to atrial fibrillation. Code Status:Full Family Communication:Spoke with Daughter [name] [phone] Disposition Plan:Continue treatment of pneumonia with cefepime and IV fluid. PT evaluation pending. Further management per GI.   Consultants:  GI  Procedures:  None  Antimicrobials:  Vancomycin 4/7-4/8  Cefepime 4/7->  Subjective: Patient seen and evaluated today with no new acute complaints or concerns. No acute concerns or events noted overnight.  She is complaining of bilateral lower extremity pain, but this is not severe.  Objective: Vitals:   07/10/18 0400 07/10/18 0500 07/10/18 0600 07/10/18 0700  BP: (!) 154/77 (!) 162/70 (!) 141/76 (!) 150/74  Pulse: 80 (!) 112 89 81  Resp: (!) 23 (!) 22 18 (!) 21  Temp: 98 F (  36.7 C)     TempSrc: Oral     SpO2: 95% 94% 96% 98%  Weight:  64.6 kg    Height:         Intake/Output Summary (Last 24 hours) at 07/10/2018 0718 Last data filed at 07/10/2018 0400 Gross per 24 hour  Intake 1682.79 ml  Output 1500 ml  Net 182.79 ml   Filed Weights   07/08/18 0100 07/09/18 0500 07/10/18 0500  Weight: 66.1 kg 66.7 kg 64.6 kg    Examination:  General exam: Appears calm and comfortable  Respiratory system: Clear to auscultation. Respiratory effort normal. Cardiovascular system: S1 & S2 heard, RRR. No JVD, murmurs, rubs, gallops or clicks. No pedal edema. Gastrointestinal system: Abdomen is nondistended, soft and nontender. No organomegaly or masses felt. Normal bowel sounds heard. Central nervous system: Alert and oriented. No focal neurological deficits. Extremities: Symmetric 5 x 5 power. Skin: No rashes, lesions or ulcers Psychiatry: Judgement and insight appear normal. Mood & affect appropriate.     Data Reviewed: I have personally reviewed following labs and imaging studies  CBC: Recent Labs  Lab 07/07/18 1845 07/08/18 0406 07/09/18 0536 07/10/18 0536  WBC 27.4* 26.6* 23.2* 25.6*  NEUTROABS 22.6*  --   --   --   HGB 10.9* 9.9* 10.5* 10.8*  HCT 35.7* 33.0* 35.0* 35.3*  MCV 84.6 86.4 84.7 84.2  PLT 424* 376 441* 474*   Basic Metabolic Panel: Recent Labs  Lab 07/07/18 1845 07/08/18 0406 07/09/18 0536 07/10/18 0536  NA 132* 138 133* 134*  K 3.4* 3.8 2.5* 3.3*  CL 100 109 102 102  CO2 22 22 21* 20*  GLUCOSE 166* 130* 94 110*  BUN 12 10 12 8   CREATININE 0.76 0.48 0.51 0.54  CALCIUM 8.3* 8.1* 8.1* 8.5*  MG  --   --   --  1.7   GFR: Estimated Creatinine Clearance: 62.7 mL/min (by C-G formula based on SCr of 0.54 mg/dL). Liver Function Tests: Recent Labs  Lab 07/07/18 1845 07/08/18 0406 07/09/18 0536  AST 50* 41 44*  ALT 21 18 18   ALKPHOS 150* 131* 134*  BILITOT 0.9 0.8 0.8  PROT 6.3* 5.6* 5.9*  ALBUMIN 2.1* 1.9* 1.9*   Recent Labs  Lab 07/08/18 1325  LIPASE 65*   No results for input(s): AMMONIA in the last 168  hours. Coagulation Profile: Recent Labs  Lab 07/07/18 1846  INR 1.7*   Cardiac Enzymes: Recent Labs  Lab 07/07/18 1845  CKTOTAL 30*  TROPONINI <0.03   BNP (last 3 results) No results for input(s): PROBNP in the last 8760 hours. HbA1C: No results for input(s): HGBA1C in the last 72 hours. CBG: Recent Labs  Lab 07/07/18 1951  GLUCAP 150*   Lipid Profile: No results for input(s): CHOL, HDL, LDLCALC, TRIG, CHOLHDL, LDLDIRECT in the last 72 hours. Thyroid Function Tests: Recent Labs    07/08/18 0052  TSH 0.938   Anemia Panel: No results for input(s): VITAMINB12, FOLATE, FERRITIN, TIBC, IRON, RETICCTPCT in the last 72 hours. Sepsis Labs: Recent Labs  Lab 07/07/18 2148 07/08/18 0052 07/08/18 0406 07/09/18 0536 07/10/18 0536  PROCALCITON  --   --  0.69 0.47 0.76  LATICACIDVEN 1.6 1.4 1.3 1.1  --     Recent Results (from the past 240 hour(s))  Blood culture (routine x 2)     Status: None (Preliminary result)   Collection Time: 07/07/18  8:02 PM  Result Value Ref Range Status   Specimen Description BLOOD RIGHT ANTECUBITAL  Final  Special Requests   Final    BOTTLES DRAWN AEROBIC AND ANAEROBIC Blood Culture adequate volume   Culture   Final    NO GROWTH 2 DAYS Performed at [facility], [address], [place], [place] [address]    Report Status PENDING  Incomplete  Blood culture (routine x 2)     Status: None (Preliminary result)   Collection Time: 07/07/18  8:07 PM  Result Value Ref Range Status   Specimen Description BLOOD RIGHT HAND  Final   Special Requests   Final    BOTTLES DRAWN AEROBIC AND ANAEROBIC Blood Culture adequate volume   Culture   Final    NO GROWTH 2 DAYS Performed at [facility], [address], [place], [place] [address]    Report Status PENDING  Incomplete  MRSA PCR Screening     Status: None   Collection Time: 07/08/18 12:37 AM  Result Value Ref Range Status   MRSA by PCR NEGATIVE NEGATIVE Final    Comment:        The GeneXpert  MRSA Assay (FDA approved for NASAL specimens only), is one component of a comprehensive MRSA colonization surveillance program. It is not intended to diagnose MRSA infection nor to guide or monitor treatment for MRSA infections. Performed at [facility], [address] [place], [place] [address]          Radiology Studies: Ct Abdomen Pelvis W Wo Contrast  Result Date: 07/09/2018 CLINICAL DATA:  Generalized weakness with nausea and left lower abdominal pain. Known mesenteric ischemia pancreatitis. EXAM: CT ABDOMEN AND PELVIS WITHOUT AND WITH CONTRAST TECHNIQUE: Multidetector CT imaging of the abdomen and pelvis was performed following the standard protocol before and following the bolus administration of intravenous contrast. CONTRAST:  OMNIPAQUE IOHEXOL 300 MG/ML  SOLN COMPARISON:  05/27/2018 FINDINGS: Lower chest: Dependent atelectasis noted posterior right lower lobe. Hepatobiliary: 4 mm low-density lesion medial segment left liver too small to characterize but likely benign etiology such as cyst. There is no evidence for gallstones, gallbladder wall thickening, or pericholecystic fluid. No intrahepatic or extrahepatic biliary dilation. Pancreas: Interval organization of the fluid collection seen previously the pancreatic tail. Dominant component in the pancreatic tail demonstrates rim enhancement and measures 2.5 x 3.3 cm (36/4). This fluid than dissects posteriorly into the left perirenal space and is contiguous with a 3.1 x 1.0 cm rim enhancing fluid collection along the anterior margin of the upper pole left kidney. Peripancreatic edema/inflammation in the region of the pancreatic tail is evident. 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 Adrenals/Urinary Tract: No adrenal nodule or mass. 6 mm nonobstructing stone identified interpolar right kidney. Mild right hydronephrosis is associated with distention of the proximal right ureter. Similar  mild left hydroureteronephrosis evident. Right ureter transitions to normal diameter at the level of the right external iliac artery. Left ureter remains distended down to the left pelvic sidewall as well. Bladder is distended. Stomach/Bowel: Small hiatal hernia. Stomach otherwise unremarkable. Duodenum is normally positioned as is the ligament of Treitz. Small bowel loops are fluid-filled and distended up to 2.7 cm diameter. No discrete transition zone can be identified, but distal small bowel and terminal ileum are decompressed. The appendix is not visualized, but there is no edema or inflammation in the region of the cecum. Right colon is unremarkable. Transverse colon is distended. Distal transverse colon and splenic flexure shows circumferential wall thickening. Descending colon is abnormal, demonstrating pericolonic edema/inflammation. Lateral and posterior wall of the descending colon  is thickened but anterior and medial wall is imperceptible. Distal descending colon into the sigmoid colon shows circumferential wall thickening again. There is fluid in the left paracolic gutter and edema/fluid tracks into the left omentum. 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 3.9 x 1.8 cm fluid collection is identified adjacent to the distal aspect of the right external iliac limb (119/4). 2.6 x 1.1 cm collection in the right groin may be a seroma. Celiac axis remains occluded. SMA is opacified. Portal vein is patent. Splenic vein is markedly attenuated but remains patent. The SMV is patent. Reproductive: Uterus surgically absent. 2.2 x 3.6 cm cystic lesion along the left pelvic sidewall is presumably ovarian and unchanged since prior study. Other: No substantial intraperitoneal free fluid although there is a small amount of fluid in the para colic gutters bilaterally. No intraperitoneal free air.  Musculoskeletal: No worrisome lytic or sclerotic osseous abnormality. Status post lumbosacral fusion. Spinal stimulator device evident. Intramuscular lipoma noted anterior aspect proximal right thigh. IMPRESSION: 1. Interval development of abnormal splenic flexure and descending colon. Circumferential wall thickening noted distal transverse colon and splenic flexure with proximal and mid descending colon showing posterior wall thickening but anterior colonic wall is imperceptible in this region. Distal descending colon into the sigmoid colon again shows circumferential wall thickening. There is prominent edema/inflammation around the proximal and mid descending colon with fluid/edema tracking into the left mesentery and pooling in the left paracolic gutter. Given celiac axis and IMA occlusion, ischemic disease a concern and watershed event would be a consideration. Infectious/inflammatory colitis also possibility. 2. Mild distention of proximal and mid small bowel with decompressed small bowel loops. Features probably related to ileus from the above described colonic pathology. 3. Interval evolution of pancreatic tail pseudocyst, now with organized, enhancing rim and evidence of extension into the perinephric space, along the anterior capsule of the upper left kidney. 4. Status post aortobifemoral bypass graft. Right iliac limb now opacifies. Small fluid collection identified around the distal aspect of the right external iliac limb with increased attenuation, suggesting hematoma. 5. Probable small seroma or hematoma in the right groin. These results will be called to the ordering clinician or representative by the Radiologist Assistant, and communication documented in the PACS or zVision Dashboard. Electronically Signed   By: Kennith Center M.D.   On: 07/09/2018 19:41   US Abdomen Limited Ruq  Result Date: 07/08/2018 CLINICAL DATA:  Abnormal liver function test. EXAM: ULTRASOUND ABDOMEN LIMITED RIGHT UPPER  QUADRANT COMPARISON:  None. FINDINGS: Gallbladder: Significant sludge within the gallbladder, but no shadowing stones, no wall thickening and no pericholecystic fluid. Common bile duct: Diameter: 5 mm Liver: No focal lesion identified. Within normal limits in parenchymal echogenicity. Portal vein is patent on color Doppler imaging with normal direction of blood flow towards the liver. IMPRESSION: 1. No acute findings.  No acute cholecystitis or bile duct dilation. 2. Gallbladder sludge without stones. 3. Normal sonographic appearance of the liver. Electronically Signed   By: Amie Portland M.D.   On: 07/08/2018 10:23        Scheduled Meds:  atorvastatin  40 mg Oral Daily   Chlorhexidine Gluconate Cloth  6 each Topical Q0600   diltiazem  30 mg Oral Q6H   DULoxetine  60 mg Oral Daily   feeding supplement (PRO-STAT SUGAR FREE 64)  30 mL Oral BID   fenofibrate  160 mg Oral  Daily   lidocaine  1 patch Transdermal Q24H   metoprolol tartrate  25 mg Oral BID   nicotine  21 mg Transdermal Daily   pantoprazole  40 mg Oral Q1200   potassium chloride  10 mEq Oral Daily   pregabalin  200 mg Oral TID   Continuous Infusions:  sodium chloride 100 mL (07/09/18 0639)   ceFEPime (MAXIPIME) IV 2 g (07/10/18 0541)   heparin 1,000 Units/hr (07/10/18 0542)   lactated ringers 75 mL/hr at 07/09/18 2204   potassium chloride       LOS: 3 days    Time spent: 30 minutes    Seth Higginbotham Hoover Brunette Shaney Deckman, DO [facility] Pager [phone]  If 7PM-7AM, please contact night-coverage [email or web address] Password [email or web address] 07/10/2018, 7:18 AM

## 2018-07-10 NOTE — Progress Notes (Signed)
ANTICOAGULATION CONSULT NOTE   Pharmacy Consult for heparin  Indication: atrial fibrillation  No Known Allergies  Patient Measurements: Height: 5\' 7"  (170.2 cm) Weight: 142 lb 6.7 oz (64.6 kg) IBW/kg (Calculated) : 61.6 Heparin Dosing Weight: HEPARIN DW (KG): 66.1   Vital Signs: Temp: 97.1 F (36.2 C) (04/10 1125) Temp Source: Oral (04/10 1125) BP: 120/56 (04/10 1500) Pulse Rate: 73 (04/10 1500)  Labs: Recent Labs    07/07/18 1845  07/07/18 1846 07/08/18 0406 07/09/18 0536 07/09/18 1340 07/09/18 2235 07/09/18 2239 07/10/18 0536 07/10/18 1410  HGB 10.9*  --   --  9.9* 10.5*  --   --   --  10.8*  --   HCT 35.7*  --   --  33.0* 35.0*  --   --   --  35.3*  --   PLT 424*  --   --  376 441*  --   --   --  474*  --   APTT  --    < > 41*  --   --  69*  --  66* 61* 36  LABPROT  --   --  19.9*  --   --   --   --   --   --  16.1*  INR  --   --  1.7*  --   --   --   --   --   --  1.3*  HEPARINUNFRC  --   --   --   --   --  1.88* 1.30*  --  1.02*  --   CREATININE 0.76  --   --  0.48 0.51  --   --   --  0.54  --   CKTOTAL 30*  --   --   --   --   --   --   --   --   --   TROPONINI <0.03  --   --   --   --   --   --   --   --   --    < > = values in this interval not displayed.    Estimated Creatinine Clearance: 62.7 mL/min (by C-G formula based on SCr of 0.54 mg/dL).   Assessment: 73 y.o. female with h/o Afib, Eliquis on hold, for heparin  Goal of Therapy:  aPTT 66-102 seconds Heparin level 0.3-0.7 units/ml Monitor platelets by anticoagulation protocol: Yes   Plan:  Increase heparin to 1450 units/hr for subtherapeutic aPTT of 36, will recheck in 2 hours   Follow-up am labs.   Sammye Staff 07/10/2018,3:58 PM

## 2018-07-10 NOTE — Progress Notes (Signed)
Patient to have stent placed at [facility] on Monday 4/13 at 1200. [facility] to transport and [facility] at 1100 prior to procedure and then back to [facility] after procedure. Called [facility] to schedule transport.  Patient needs to be NPO at midnight prior to the procedure. Also, the heparin does not need to be stopped.

## 2018-07-11 ENCOUNTER — Inpatient Hospital Stay (HOSPITAL_COMMUNITY): Payer: Medicare Other

## 2018-07-11 DIAGNOSIS — K559 Vascular disorder of intestine, unspecified: Secondary | ICD-10-CM

## 2018-07-11 LAB — COMPREHENSIVE METABOLIC PANEL
ALT: 14 U/L (ref 0–44)
AST: 36 U/L (ref 15–41)
Albumin: 1.6 g/dL — ABNORMAL LOW (ref 3.5–5.0)
Alkaline Phosphatase: 119 U/L (ref 38–126)
Anion gap: 11 (ref 5–15)
BUN: 7 mg/dL — ABNORMAL LOW (ref 8–23)
CO2: 21 mmol/L — ABNORMAL LOW (ref 22–32)
Calcium: 8.1 mg/dL — ABNORMAL LOW (ref 8.9–10.3)
Chloride: 98 mmol/L (ref 98–111)
Creatinine, Ser: 0.62 mg/dL (ref 0.44–1.00)
GFR calc Af Amer: >60 mL/min (ref >60)
GFR calc non Af Amer: >60 mL/min (ref >60)
Glucose, Bld: 116 mg/dL — ABNORMAL HIGH (ref 70–99)
Potassium: 5 mmol/L (ref 3.5–5.1)
Sodium: 130 mmol/L — ABNORMAL LOW (ref 135–145)
Total Bilirubin: 0.8 mg/dL (ref 0.3–1.2)
Total Protein: 5.3 g/dL — ABNORMAL LOW (ref 6.5–8.1)

## 2018-07-11 LAB — CBC
HCT: 31.9 % — ABNORMAL LOW (ref 36.0–46.0)
HCT: 30 % — ABNORMAL LOW (ref 36.0–46.0)
Hemoglobin: 9.8 g/dL — ABNORMAL LOW (ref 12.0–15.0)
Hemoglobin: 9.4 g/dL — ABNORMAL LOW (ref 12.0–15.0)
MCH: 25.5 pg — ABNORMAL LOW (ref 26.0–34.0)
MCH: 25.8 pg — ABNORMAL LOW (ref 26.0–34.0)
MCHC: 30.7 g/dL (ref 30.0–36.0)
MCHC: 31.3 g/dL (ref 30.0–36.0)
MCV: 83.1 fL (ref 80.0–100.0)
MCV: 82.4 fL (ref 80.0–100.0)
Platelets: 471 10*3/uL — ABNORMAL HIGH (ref 150–400)
Platelets: 438 10*3/uL — ABNORMAL HIGH (ref 150–400)
RBC: 3.84 MIL/uL — ABNORMAL LOW (ref 3.87–5.11)
RBC: 3.64 MIL/uL — ABNORMAL LOW (ref 3.87–5.11)
RDW: 18.2 % — ABNORMAL HIGH (ref 11.5–15.5)
RDW: 17.8 % — ABNORMAL HIGH (ref 11.5–15.5)
WBC: 19.9 10*3/uL — ABNORMAL HIGH (ref 4.0–10.5)
WBC: 29.1 10*3/uL — ABNORMAL HIGH (ref 4.0–10.5)
nRBC: 0.2 % (ref 0.0–0.2)
nRBC: 0.3 % — ABNORMAL HIGH (ref 0.0–0.2)

## 2018-07-11 LAB — BASIC METABOLIC PANEL
Anion gap: 11 (ref 5–15)
BUN: 7 mg/dL — ABNORMAL LOW (ref 8–23)
CO2: 23 mmol/L (ref 22–32)
Calcium: 7.8 mg/dL — ABNORMAL LOW (ref 8.9–10.3)
Chloride: 98 mmol/L (ref 98–111)
Creatinine, Ser: 0.48 mg/dL (ref 0.44–1.00)
GFR calc Af Amer: >60 mL/min (ref >60)
GFR calc non Af Amer: >60 mL/min (ref >60)
Glucose, Bld: 89 mg/dL (ref 70–99)
Potassium: 2.9 mmol/L — ABNORMAL LOW (ref 3.5–5.1)
Sodium: 132 mmol/L — ABNORMAL LOW (ref 135–145)

## 2018-07-11 LAB — LACTIC ACID, PLASMA: Lactic Acid, Venous: 1 mmol/L (ref 0.5–1.9)

## 2018-07-11 LAB — MAGNESIUM: Magnesium: 1.5 mg/dL — ABNORMAL LOW (ref 1.7–2.4)

## 2018-07-11 LAB — HEPARIN LEVEL (UNFRACTIONATED): Heparin Unfractionated: 0.63 IU/mL (ref 0.30–0.70)

## 2018-07-11 LAB — GLUCOSE, CAPILLARY: Glucose-Capillary: 124 mg/dL — ABNORMAL HIGH (ref 70–99)

## 2018-07-11 LAB — APTT: aPTT: 100 seconds — ABNORMAL HIGH (ref 24–36)

## 2018-07-11 MED ORDER — PREGABALIN 100 MG PO CAPS
100.0000 mg | ORAL_CAPSULE | Freq: Three times a day (TID) | ORAL | Status: AC
  Administered 2018-07-11: 100 mg via ORAL
  Filled 2018-07-11: qty 1

## 2018-07-11 MED ORDER — POTASSIUM CHLORIDE 10 MEQ/100ML IV SOLN
10.0000 meq | INTRAVENOUS | Status: AC
  Administered 2018-07-11 (×6): 10 meq via INTRAVENOUS
  Filled 2018-07-11 (×6): qty 100

## 2018-07-11 MED ORDER — FOLIC ACID 1 MG PO TABS: 1.0000 mg | ORAL_TABLET | Freq: Every day | ORAL | Status: DC

## 2018-07-11 MED ORDER — LORAZEPAM 2 MG/ML IJ SOLN
0.0000 mg | Freq: Four times a day (QID) | INTRAMUSCULAR | Status: AC
  Administered 2018-07-11: 1 mg via INTRAVENOUS
  Filled 2018-07-11: qty 1

## 2018-07-11 MED ORDER — VITAMIN B-1 100 MG PO TABS
100.0000 mg | ORAL_TABLET | Freq: Every day | ORAL | Status: DC
  Administered 2018-07-12 – 2018-07-20 (×4): 100 mg via ORAL
  Filled 2018-07-11 (×4): qty 1

## 2018-07-11 MED ORDER — FOLIC ACID 1 MG PO TABS
1.0000 mg | ORAL_TABLET | Freq: Every day | ORAL | Status: DC
  Administered 2018-07-12 – 2018-07-20 (×6): 1 mg via ORAL
  Filled 2018-07-11 (×7): qty 1

## 2018-07-11 MED ORDER — POTASSIUM CHLORIDE CRYS ER 20 MEQ PO TBCR
40.0000 meq | EXTENDED_RELEASE_TABLET | Freq: Every day | ORAL | Status: DC
  Administered 2018-07-11 – 2018-07-15 (×4): 40 meq via ORAL
  Filled 2018-07-11 (×5): qty 2

## 2018-07-11 MED ORDER — LORAZEPAM 2 MG/ML IJ SOLN: 2.0000 mg | INTRAMUSCULAR | Status: DC | PRN

## 2018-07-11 MED ORDER — HYDRALAZINE HCL 20 MG/ML IJ SOLN: 10.0000 mg | INTRAMUSCULAR | Status: DC | PRN

## 2018-07-11 MED ORDER — LORAZEPAM 2 MG/ML IJ SOLN: 1.0000 mg | Freq: Four times a day (QID) | INTRAMUSCULAR | Status: AC | PRN

## 2018-07-11 MED ORDER — ADULT MULTIVITAMIN W/MINERALS CH
1.0000 | ORAL_TABLET | Freq: Every day | ORAL | Status: DC
  Administered 2018-07-11 – 2018-07-18 (×5): 1 via ORAL
  Filled 2018-07-11 (×6): qty 1

## 2018-07-11 MED ORDER — PREGABALIN 100 MG PO CAPS
200.0000 mg | ORAL_CAPSULE | Freq: Three times a day (TID) | ORAL | Status: DC
  Administered 2018-07-12 – 2018-07-26 (×37): 200 mg via ORAL
  Filled 2018-07-11 (×40): qty 2

## 2018-07-11 MED ORDER — LORAZEPAM 2 MG/ML IJ SOLN: 0.0000 mg | Freq: Two times a day (BID) | INTRAMUSCULAR | Status: AC

## 2018-07-11 MED ORDER — THIAMINE HCL 100 MG/ML IJ SOLN: 100.0000 mg | Freq: Once | INTRAMUSCULAR | Status: DC

## 2018-07-11 MED ORDER — METOPROLOL TARTRATE 5 MG/5ML IV SOLN: 5.0000 mg | INTRAVENOUS | Status: DC | PRN

## 2018-07-11 MED ORDER — SODIUM CHLORIDE 0.9 % IV SOLN: INTRAVENOUS | Status: DC

## 2018-07-11 MED ORDER — CALCIUM GLUCONATE-NACL 1-0.675 GM/50ML-% IV SOLN
1.0000 g | Freq: Once | INTRAVENOUS | Status: AC
  Administered 2018-07-11: 1000 mg via INTRAVENOUS
  Filled 2018-07-11: qty 50

## 2018-07-11 MED ORDER — LORAZEPAM 1 MG PO TABS: 1.0000 mg | ORAL_TABLET | Freq: Four times a day (QID) | ORAL | Status: AC | PRN

## 2018-07-11 MED ORDER — THIAMINE HCL 100 MG/ML IJ SOLN
100.0000 mg | Freq: Every day | INTRAMUSCULAR | Status: DC
  Administered 2018-07-14 – 2018-07-19 (×4): 100 mg via INTRAVENOUS
  Filled 2018-07-11 (×4): qty 2

## 2018-07-11 MED ORDER — LACTATED RINGERS IV BOLUS
500.0000 mL | Freq: Once | INTRAVENOUS | Status: AC
  Administered 2018-07-11: 500 mL via INTRAVENOUS

## 2018-07-11 MED ORDER — TIZANIDINE HCL 4 MG PO TABS: 2.0000 mg | ORAL_TABLET | Freq: Three times a day (TID) | ORAL | Status: DC | PRN

## 2018-07-11 NOTE — Progress Notes (Signed)
Attending provider Dr. Lynden Oxford, MD at bedside to assess patient.  Orders obtained for 500 cc LR bolus, calcium gluconate infusion, and CIWA assessment, and other orders as specified by provider.  Will continue to monitor patient.

## 2018-07-11 NOTE — Progress Notes (Signed)
Received call back from Dr. Sherryll Burger, DO at [facility], notifying me that the provider at [facility] would be Dr. Lynden Oxford, MD.

## 2018-07-11 NOTE — Progress Notes (Signed)
Placed on [facility]. Pt sats below 92 on RA. Sats better [place]. Will continue to monitor.

## 2018-07-11 NOTE — Progress Notes (Addendum)
PROGRESS NOTE    [name]  ZOX:[id number] DOB: 1945/09/19 DOA: 07/07/2018 PCP: [name], [name], [name]   Brief Narrative:  Per HPI: Alvina FilbertJefferey PicaLinda S Cardwellis a 73 y.o.femalewith medical history significant ofchronic back pain, GERD, hyperlipidemia, hypertension, left knee osteoarthritis, palpitations, PVDh/o Aortofem bypass, s/p RLE thromboembolectomy and R common emoral artery patch angioplasty 05/31/18, history of tobacco usewho presents to ER due to fall at home. [facility].   Pt is very somnolent , and difficult to elicit history from.  Patient was admitted with generalized weakness secondary to suspected sepsis with questionable source of pneumonia. She was also noted to be hypotensiveon admission, but this has improved. She is noted to have significant GI issues to include loss of appetite and weight loss recently for which CT of the abdomen was performed on 4/9 demonstrating evolution of pancreatic tail pseudocyst as well as concerns for mesenteric ischemia.  Original plan was for mesenteric arteriogram and potential revascularization of SMA by IR, but after receiving a call from IR this morning they recommended the vascular surgery see the patient and perform the procedure as needed.  I have spoken with Dr. Darrick PennaFields of vascular surgery who agrees to see the patient in consultation for potential mesenteric arteriogram and revascularization of SMA after examination at [facility].  Assessment & Plan:   Principal Problem:   Sepsis (HCC) Active Problems:   Hyperlipidemia with target LDL less than 100   Essential hypertension   Peripheral vascular disease (HCC)   Severe protein-calorie malnutrition (HCC)   Abnormal liver function   Loss of weight   Nausea without vomiting  1. Sepsis secondary to questionable pneumonia-improving. Continue treatment on cefepimewith blood culture results demonstrating no growth as of yet.Anticipate  conversion to Augmentin prior to discharge.Will hold IV fluid for now as she is tolerating some full liquids.  Will check chest x-ray today. Continues to have elevated leukocytosis on CBC, but this is improving today.  Will recheck CBC in a.m. 2. Poor oral intake due to intractable nausea and vomiting and abdominal pain with associated weight loss.  Suspected secondary to mesenteric ischemia and pancreatic pseudocyst.  Appreciate GI recommendations with full liquid diet ordered which patient is tolerating. 3. Severe hypokalemia- persistent. Replete IV and oral today and recheck in a.m. Maintain in stepdown unit for close telemetry monitoring. 4. PVD with dry gangrene.  Continue on Eliquis on discharge. 5. Dyslipidemia. Continue on fenofibrate and atorvastatin. 6. COPD with tobacco abuse. Duo nebs every6hours as needed and nicotine patch. Smoking cessation advised. 7. Essential hypertension. Continue double metoprolol dose as well as Cardizem with good heart rate, but blood pressure remains elevated.  We will add hydralazine IV as needed. 8. GERD. Maalox as needed.Started on PPI. 9. Abnormal LFTs-stable.Right upper quadrant ultrasound with no acute findings.Will need outpatient follow-up as she is on statin. 10. Fall at home with weakness. PT recommendations for home health on discharge. 11. Atrial fibrillation- likely new onset. Maintain on heparin drip for now and 2D echocardiogram with LVEF 50 to 55%. Cardizem added for additional rate control with good response noted.   DVT prophylaxis:Eliquis heldfor now;will place on heparin drip due to atrial fibrillation. Code Status:Full Family Communication:Spoke with Daughter Dellis Filbertammy Kallam [phone] Disposition Plan:Continue treatment of pneumonia with cefepime and follow CBC. Potential plan for mesenteric angiogram and possible revascularization of SMA per vascular surgery if needed.  [facility] evaluation by vascular team to better define  the etiology of her GI issues.  Check chest x-ray today and then consider reordering fluid as needed.   Consultants:  GI  Spoke with Dr. Darrick Penna of vascular surgery  Procedures:  None  Antimicrobials:  Vancomycin 4/7-4/8  Cefepime 4/7->  Subjective: Patient seen and evaluated today with no new acute complaints or concerns. No acute concerns or events noted overnight.  She is tolerating her full liquid diet with very minimal abdominal pain and no further nausea or vomiting.  She remains on 2 L nasal cannula with no respiratory distress.  Objective: Vitals:   07/11/18 0411 07/11/18 0500 07/11/18 0600 07/11/18 0733  BP: 130/71 134/62 136/63   Pulse: 65 80 72 77  Resp: Temp:    98.2 F (36.8 C)  TempSrc:    Oral  SpO2: 97% 97% 97% 97%  Weight:  66.9 kg    Height:        Intake/Output Summary (Last 24 hours) at 07/11/2018 0813 Last data filed at 07/11/2018 0553 Gross per 24 hour  Intake 3937.69 ml  Output 741 ml  Net 3196.69 ml   Filed Weights   07/09/18 0500 07/10/18 0500 07/11/18 0500  Weight: 66.7 kg 64.6 kg 66.9 kg    Examination:  General exam: Appears calm and comfortable  Respiratory system: Clear to auscultation. Respiratory effort normal.  Currently on 2 L nasal cannula. Cardiovascular system: S1 & S2 heard, RRR. No JVD, murmurs, rubs, gallops or clicks. No pedal edema. Gastrointestinal system: Abdomen is nondistended, soft and nontender. No organomegaly or masses felt. Normal bowel sounds heard. Central nervous system: Alert and oriented. No focal neurological deficits. Extremities: Symmetric 5 x 5 power. Skin: No rashes, lesions or ulcers Psychiatry: Judgement and insight appear normal. Mood & affect appropriate.     Data Reviewed: I have personally reviewed following labs and imaging studies  CBC: Recent Labs  Lab 07/07/18 1845 07/08/18 0406 07/09/18 0536  07/10/18 0536 07/11/18 0446  WBC 27.4* 26.6* 23.2* 25.6* 19.9*  NEUTROABS 22.6*  --   --   --   --   HGB 10.9* 9.9* 10.5* 10.8* 9.8*  HCT 35.7* 33.0* 35.0* 35.3* 31.9*  MCV 84.6 86.4 84.7 84.2 83.1  PLT 424* 376 441* 474* 471*   Basic Metabolic Panel: Recent Labs  Lab 07/07/18 1845 07/08/18 0406 07/09/18 0536 07/10/18 0536 07/11/18 0446  NA 132* 138 133* 134* 132*  K 3.4* 3.8 2.5* 3.3* 2.9*  CL 100 109 102 102 98  CO2 22 22 21* 20* 23  GLUCOSE 166* 130* 94 110* 89  BUN 7*  CREATININE 0.76 0.48 0.51 0.54 0.48  CALCIUM 8.3* 8.1* 8.1* 8.5* 7.8*  MG  --   --   --  1.7  --    GFR: Estimated Creatinine Clearance: 62.7 mL/min (by C-G formula based on SCr of 0.48 mg/dL). Liver Function Tests: Recent Labs  Lab 07/07/18 1845 07/08/18 0406 07/09/18 0536  AST 50* 41 44*  ALT ALKPHOS 150* 131* 134*  BILITOT 0.9 0.8 0.8  PROT 6.3* 5.6* 5.9*  ALBUMIN 2.1* 1.9* 1.9*   Recent Labs  Lab 07/08/18 1325  LIPASE 65*   No results for input(s): AMMONIA in the last 168 hours. Coagulation Profile: Recent Labs  Lab 07/07/18 1846 07/10/18 1410  INR 1.7* 1.3*   Cardiac Enzymes: Recent Labs  Lab 07/07/18 1845  CKTOTAL 30*  TROPONINI <0.03   BNP (last 3 results) No results for  input(s): PROBNP in the last 8760 hours. HbA1C: No results for input(s): HGBA1C in the last 72 hours. CBG: Recent Labs  Lab 07/07/18 1951  GLUCAP 150*   Lipid Profile: No results for input(s): CHOL, HDL, LDLCALC, TRIG, CHOLHDL, LDLDIRECT in the last 72 hours. Thyroid Function Tests: No results for input(s): TSH, T4TOTAL, FREET4, T3FREE, THYROIDAB in the last 72 hours. Anemia Panel: No results for input(s): VITAMINB12, FOLATE, FERRITIN, TIBC, IRON, RETICCTPCT in the last 72 hours. Sepsis Labs: Recent Labs  Lab 07/08/18 0052 07/08/18 0406 07/09/18 0536 07/10/18 0536 07/11/18 0446  PROCALCITON  --  0.69 0.47 0.76  --   LATICACIDVEN 1.4 1.3 1.1  --  1.0    Recent  Results (from the past 240 hour(s))  Blood culture (routine x 2)     Status: None (Preliminary result)   Collection Time: 07/07/18  8:02 PM  Result Value Ref Range Status   Specimen Description BLOOD RIGHT ANTECUBITAL  Final   Special Requests   Final    BOTTLES DRAWN AEROBIC AND ANAEROBIC Blood Culture adequate volume   Culture   Final    NO GROWTH 4 DAYS Performed at [facility], [address], [place], [place] [address]    Report Status PENDING  Incomplete  Blood culture (routine x 2)     Status: None (Preliminary result)   Collection Time: 07/07/18  8:07 PM  Result Value Ref Range Status   Specimen Description BLOOD RIGHT HAND  Final   Special Requests   Final    BOTTLES DRAWN AEROBIC AND ANAEROBIC Blood Culture adequate volume   Culture   Final    NO GROWTH 4 DAYS Performed at [facility], [address], [place], [place] [address]    Report Status PENDING  Incomplete  MRSA PCR Screening     Status: None   Collection Time: 07/08/18 12:37 AM  Result Value Ref Range Status   MRSA by PCR NEGATIVE NEGATIVE Final    Comment:        The GeneXpert MRSA Assay (FDA approved for NASAL specimens only), is one component of a comprehensive MRSA colonization surveillance program. It is not intended to diagnose MRSA infection nor to guide or monitor treatment for MRSA infections. Performed at [facility], [address], [place], [place] [address]          Radiology Studies: Ct Abdomen Pelvis W Wo Contrast  Result Date: 07/09/2018 CLINICAL DATA:  Generalized weakness with nausea and left lower abdominal pain. Known mesenteric ischemia pancreatitis. EXAM: CT ABDOMEN AND PELVIS WITHOUT AND WITH CONTRAST TECHNIQUE: Multidetector CT imaging of the abdomen and pelvis was performed following the standard protocol before and following the bolus administration of intravenous contrast. CONTRAST:  OMNIPAQUE IOHEXOL 300 MG/ML  SOLN COMPARISON:  05/27/2018 FINDINGS: Lower chest:  Dependent atelectasis noted posterior right lower lobe. Hepatobiliary: 4 mm low-density lesion medial segment left liver too small to characterize but likely benign etiology such as cyst. There is no evidence for gallstones, gallbladder wall thickening, or pericholecystic fluid. No intrahepatic or extrahepatic biliary dilation. Pancreas: Interval organization of the fluid collection seen previously the pancreatic tail. Dominant component in the pancreatic tail demonstrates rim enhancement and measures 2.5 x 3.3 cm (36/4). This fluid than dissects posteriorly into the left perirenal space and is contiguous with a 3.1 x 1.0 cm rim enhancing fluid collection along the anterior margin of the upper pole left kidney. Peripancreatic edema/inflammation in the region of the pancreatic tail is evident. Spleen: Irregular splenic  contour with multiple probable splenic infarcts, as before. Adrenals/Urinary Tract: No adrenal nodule or mass. 6 mm nonobstructing stone identified interpolar right kidney. Mild right hydronephrosis is associated with distention of the proximal right ureter. Similar mild left hydroureteronephrosis evident. Right ureter transitions to normal diameter at the level of the right external iliac artery. Left ureter remains distended down to the left pelvic sidewall as well. Bladder is distended. Stomach/Bowel: Small hiatal hernia. Stomach otherwise unremarkable. Duodenum is normally positioned as is the ligament of Treitz. Small bowel loops are fluid-filled and distended up to 2.7 cm diameter. No discrete transition zone can be identified, but distal small bowel and terminal ileum are decompressed. The appendix is not visualized, but there is no edema or inflammation in the region of the cecum. Right colon is unremarkable. Transverse colon is distended. Distal transverse colon and splenic flexure shows circumferential wall thickening. Descending colon is abnormal, demonstrating pericolonic  edema/inflammation. Lateral and posterior wall of the descending colon is thickened but anterior and medial wall is imperceptible. Distal descending colon into the sigmoid colon shows circumferential wall thickening again. There is fluid in the left paracolic gutter and edema/fluid tracks into the left omentum. 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 3.9 x 1.8 cm fluid collection is identified adjacent to the distal aspect of the right external iliac limb (119/4). 2.6 x 1.1 cm collection in the right groin may be a seroma. Celiac axis remains occluded. SMA is opacified. Portal vein is patent. Splenic vein is markedly attenuated but remains patent. The SMV is patent. Reproductive: Uterus surgically absent. 2.2 x 3.6 cm cystic lesion along the left pelvic sidewall is presumably ovarian and unchanged since prior study. Other: No substantial intraperitoneal free fluid although there is a small amount of fluid in the para colic gutters bilaterally. No intraperitoneal free air. Musculoskeletal: No worrisome lytic or sclerotic osseous abnormality. Status post lumbosacral fusion. Spinal stimulator device evident. Intramuscular lipoma noted anterior aspect proximal right thigh. IMPRESSION: 1. Interval development of abnormal splenic flexure and descending colon. Circumferential wall thickening noted distal transverse colon and splenic flexure with proximal and mid descending colon showing posterior wall thickening but anterior colonic wall is imperceptible in this region. Distal descending colon into the sigmoid colon again shows circumferential wall thickening. There is prominent edema/inflammation around the proximal and mid descending colon with fluid/edema tracking into the left mesentery and pooling in the left paracolic gutter. Given celiac axis and IMA occlusion, ischemic disease a concern and  watershed event would be a consideration. Infectious/inflammatory colitis also possibility. 2. Mild distention of proximal and mid small bowel with decompressed small bowel loops. Features probably related to ileus from the above described colonic pathology. 3. Interval evolution of pancreatic tail pseudocyst, now with organized, enhancing rim and evidence of extension into the perinephric space, along the anterior capsule of the upper left kidney. 4. Status post aortobifemoral bypass graft. Right iliac limb now opacifies. Small fluid collection identified around the distal aspect of the right external iliac limb with increased attenuation, suggesting hematoma. 5. Probable small seroma or hematoma in the right groin. These results will be called to the ordering clinician or representative by the Radiologist Assistant, and communication documented in the PACS or zVision Dashboard. Electronically Signed   By: Kennith Center M.D.   On: 07/09/2018 19:41        Scheduled Meds:  atorvastatin  40 mg  Oral Daily   Chlorhexidine Gluconate Cloth  6 each Topical Q0600   diltiazem  30 mg Oral Q6H   DULoxetine  60 mg Oral Daily   feeding supplement (PRO-STAT SUGAR FREE 64)  30 mL Oral BID   fenofibrate  160 mg Oral Daily   lidocaine  1 patch Transdermal Q24H   metoprolol tartrate  25 mg Oral BID   nicotine  21 mg Transdermal Daily   pantoprazole  40 mg Oral Q1200   potassium chloride  40 mEq Oral Daily   pregabalin  200 mg Oral TID   Continuous Infusions:  sodium chloride 100 mL (07/09/18 0639)   ceFEPime (MAXIPIME) IV 2 g (07/11/18 0553)   heparin 1,450 Units/hr (07/11/18 0149)   potassium chloride       LOS: 4 days    Time spent: 30 minutes    Keyuna Cuthrell Hoover Brunette, DO [facility] Pager [phone]  If 7PM-7AM, please contact night-coverage [email or web address] Password [facility] 07/11/2018, 8:13 AM

## 2018-07-11 NOTE — Progress Notes (Addendum)
[facility] PROGRESS NOTE  Patient: [name] CWU:[id number]   PCP: Junie Spencer, [name] DOB: October 14, 1945   DOA: 07/07/2018   DOS: 07/11/2018    Subjective: I was called with the concern by RN that the patient is confused and hypotensive. At the time of my arrival patient reported no complaints.  No headache no dizziness no chest pain or shortness of breath no abdominal pain no nausea no vomiting.  No recent fevers.  Objective:  Vitals:   07/11/18 1651 07/11/18 1711  BP: (!) 89/52 (!) 90/48  Pulse:  (!) 108  Resp: 20 17  Temp:    SpO2:  91%    Alert awake and oriented x3. No acute distress. Pupils are equal and round and reactive. No abdominal tenderness. No focal deficit on examination.  Assessment and plan: Acute toxic encephalopathy. Secondary to polypharmacy. Patient did receive some Flexeril at 2 PM and has been on Lyrica 200 mg 3 times daily chronically. Currently I will reduce the dose of the Flexeril as well as Lyrica for today and monitor. Discontinue Cymbalta for now.  History of alcohol abuse. Do not think the patient is actually having withdrawals right now but we will base the patient on MedSurg CIWA protocol. We will also provide IV thiamine x1 as well as folic acid and multivitamins.  Hypotension. Bradycardia. Atrial fibrillation. Patient was started on Cardizem tablet as well as metoprolol. Likely the bradycardia and hypotension is secondary to both at the same time. We will discontinue both medicine. Will use PRN IV Lopressor in case of tachycardia but currently do not think that it is required. We will provide LR bolus. Also 1 dose of IV calcium gluconate will be ordered.  Hypokalemia. Hypocalcemia. 1 dose of IV calcium gluconate. Already received potassium replacement.  We will recheck the potassium and replace as needed. Maintain potassium more than 4, magnesium more than 2.  Labs ordered need to be followed. CBC BMP magnesium. EKG  evaluated shows atrial fibrillation with low heart rate.  QT is normal.  Author: Lynden Oxford, MD [facility] 07/11/2018 6:21 PM   If 7PM-7AM, please contact night-coverage at [email or web address]

## 2018-07-11 NOTE — Progress Notes (Addendum)
Patient ID: [name], female   DOB: [birth year], 73 y.o.   MRN: [id number]   Dr Bonnielee Haff was contacted about this pt by Dr Jonette Eva regarding Mesenteric ischemia  Plan was for mesenteric arteriogram with possible SMA angioplasty/stent placement in IR [facility] 4/13. After closer review of pt status and chart--- realize she is a Vascular Surgery pt with most recent vascular surgery just 05/31/18 - Dr Randie Heinz.  Dr Bonnielee Haff has spoken with Dr Darrick Penna on call for Vasc Surgery IR will HOLD off on our involvement at this time Vascular Surgery to re visit this pt and make determination as to next plan  [facility] Dr Sherryll Burger aware and agreeable.  We will cancel all IR orders and plans at this point RN aware

## 2018-07-11 NOTE — Progress Notes (Signed)
ANTICOAGULATION CONSULT NOTE   Pharmacy Consult for heparin  Indication: atrial fibrillation  No Known Allergies  Patient Measurements: Height: 5\' 7"  (170.2 cm) Weight: 147 lb 7.8 oz (66.9 kg) IBW/kg (Calculated) : 61.6  HEPARIN DW (KG): 66.1   Vital Signs: Temp: 98.2 F (36.8 C) (04/11 0733) Temp Source: Oral (04/11 0733) BP: 136/63 (04/11 0600) Pulse Rate: 77 (04/11 0733)  Labs: Recent Labs    07/09/18 0536  07/10/18 0536 07/10/18 1410 07/10/18 1830 07/11/18 0446  HGB 10.5*  --  10.8*  --   --  9.8*  HCT 35.0*  --  35.3*  --   --  31.9*  PLT 441*  --  474*  --   --  471*  APTT  --    < > 61* 36 69* 100*  LABPROT  --   --   --  16.1*  --   --   INR  --   --   --  1.3*  --   --   HEPARINUNFRC  --    < > 1.02*  --  0.61 0.63  CREATININE 0.51  --  0.54  --   --  0.48   < > = values in this interval not displayed.    Estimated Creatinine Clearance: 62.7 mL/min (by C-G formula based on SCr of 0.48 mg/dL).   Assessment: 73 y.o. female with h/o Afib, Eliquis on hold, for heparin. Last apixaban dose was 10am on 07/08/18.Hgb stable, plt stable. Heparin level and APTT appear to be correlating and HL and APTT are therapeutic.  Goal of Therapy:  aPTT 66-102 seconds Heparin level 0.3-0.7 units/ml Monitor platelets by anticoagulation protocol: Yes   Plan:  Continue heparin @ 1450 units/hr Monitor anti-Xa level daily Monitor for S/S of bleeding   Elder Cyphers, BS Loura Back, BCPS Clinical Pharmacist Pager [phone] 07/11/2018,7:40 AM

## 2018-07-11 NOTE — Significant Event (Signed)
Rapid Response Event Note  Overview:  RN called for HR dropping to 40's, pt a transfer from [facility] with no TH at [facility] assigned to her.     Initial Focused Assessment: On arrival pt lying supine in bed, alert and oriented to name, DOB and president but disoriented to location. Pt fidgeting with bed sheets and pulse ox cord. HR 40's BP 115/60, RR 18, 98 RA  Pt picking at pulse ox and unable to get a good read at times. Prior to my arrival SBP 80-90's  Dr. Allena Katz to bedside. New orders given and to be completed.   Interventions: EKG Plan of Care (if not transferred): Continue to monitor pt call RRT as needed Event Summary:   at      at          [facility], Sandi Carne

## 2018-07-11 NOTE — Progress Notes (Signed)
Patient hypotensive, B/P 86/61, HR in the 40's dipping into the upper 30's, altered mental status. Contacted provider Dr. Maurilio Lovely, DO from [facility].  Awaiting to hear back from Dr. Sherryll Burger who is the attending at [facility].

## 2018-07-11 NOTE — Consult Note (Signed)
Referring Physician: Dr Maurilio LovelyPratik Shah  Patient name: [name] MRN: [id number] DOB: Jan 01, 1946 Sex: female  REASON FOR CONSULT: ischemic colitis  HPI: Danielle FilbertLinda S Shambley is a 73 y.o. female, with a several year history of frequent nausea and vomiting after meals.  She states that in the past she was told to take rolaids or xanax.  She states the rolaids help the xanax didn't.  She denies food fear.  She has had 30lb wt loss over the last several months.  She states that two out of every 3 meals she will become nauseous and sometimes vomit.  She has also had some diarrhea recently. She does have afib and has been on chronic anticoagulation therapy. She was admitted to [facility] days ago with somnolence and a diagnosis of sepsis. Albumin was 2.1.  She has had a prior aortobifem by Dr Hart RochesterLawson in 2002 and had thrombectomy of the right limb of this by Dr Randie Heinzain 3/20.  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 Her left internal iliac artery was patent at that point.  Repeat CT on 07/09/18 is unchanged but not dedicated arterial phase so difficult to know if internal iliac arteries are patent.    [facility] was consulted and consideration was being given for stenting her SMA which has about a (30-50% stenosis) to improve mesenteric blood flow.  Other medical problems include   Past Medical History:  Diagnosis Date  . Chronic back pain   . GERD (gastroesophageal reflux disease)   . Hypercholesterolemia   . Hyperlipidemia   . Hypertension   . Osteoarthritis of left knee    End-stage  . Palpitations   . PVD (peripheral vascular disease) (HCC)   . Tobacco user    Past Surgical History:  Procedure Laterality Date  . Angiogram-type unspecified  06/13/00   Dr. Hart RochesterLawson  . FEMORAL-POPLITEAL BYPASS GRAFT    . FINGER TENDON REPAIR  1988   Right mallet  . KNEE ARTHROSCOPY   1996   Left  . LUMBAR FUSION  1998   Dr. Jacqulyn BathLong  . PATCH ANGIOPLASTY Right 05/31/2018   Procedure: PATCH ANGIOPLASTY RIGHT FEMORAL ARTERY WITH DECRON PATCH;  Surgeon: Maeola Harmanain, Brandon Christopher, MD;  [facility];  Service: Vascular;  Laterality: Right;  . THROMBECTOMY FEMORAL ARTERY Right 05/31/2018   Procedure: RIGHT FEMORAL THROMBECTOMY;  Surgeon: Maeola Harmanain, Brandon Christopher, MD;  [facility];  Service: Vascular;  Laterality: Right;    Family History  Problem Relation Age of Onset  . Heart failure Father        CHF  . Hypertension Father   . Heart disease Father   . Hypertension Paternal Aunt   . Hypertension Paternal Uncle   . Hypertension Paternal Grandmother   . Hypertension Paternal Grandfather   . Coronary artery disease Neg Hx        Premature    SOCIAL HISTORY: Social History   Socioeconomic History  . Marital status: Divorced    Spouse name: Not on file  . Number of children: Not on file  . Years of education: Not on file  . Highest education level: Not on file  Occupational History  . Occupation: Charity fundraiserWaiter    Employer: [employer]  Social Needs  . Financial resource strain: Not on file  . Food insecurity:    Worry: Not on file  Inability: Not on file  . Transportation needs:    Medical: Not on file    Non-medical: Not on file  Tobacco Use  . Smoking status: Current Every Day Smoker    Packs/day: 1.00    Years: 52.00    Pack years: 52.00    Types: Cigarettes  . Smokeless tobacco: Never Used  Substance and Sexual Activity  . Alcohol use: No    Comment: Occasionally  . Drug use: No  . Sexual activity: Not Currently  Lifestyle  . Physical activity:    Days per week: Not on file    Minutes per session: Not on file  . Stress: Not on file  Relationships  . Social connections:    Talks on phone: Not on file    Gets together: Not on file    Attends religious service: Not on file    Active member of club or organization: Not on file    Attends  meetings of clubs or organizations: Not on file    Relationship status: Not on file  . Intimate partner violence:    Fear of current or ex partner: Not on file    Emotionally abused: Not on file    Physically abused: Not on file    Forced sexual activity: Not on file  Other Topics Concern  . Not on file  Social History Narrative  . Not on file    No Known Allergies  Current Facility-Administered Medications  Medication Dose Route Frequency Provider Last Rate Last Dose  . 0.9 %  sodium chloride infusion   Intravenous PRN Maurilio Lovely D, DO 10 mL/hr at 07/09/18 0639 100 mL at 07/09/18 0639  . acetaminophen (TYLENOL) tablet 650 mg  650 mg Oral Q6H PRN Pearson Grippe, MD   650 mg at 07/09/18 1354   Or  . acetaminophen (TYLENOL) suppository 650 mg  650 mg Rectal Q6H PRN Pearson Grippe, MD      . alum & mag hydroxide-simeth (MAALOX/MYLANTA) 200-200-20 MG/5ML suspension 15-30 mL  15-30 mL Oral Q2H PRN Pearson Grippe, MD      . atorvastatin (LIPITOR) tablet 40 mg  40 mg Oral Daily Pearson Grippe, MD   40 mg at 07/11/18 0865  . ceFEPIme (MAXIPIME) 2 g in sodium chloride 0.9 % 100 mL IVPB  2 g Intravenous Q8H Vanetta Mulders, MD 200 mL/hr at 07/11/18 1450 2 g at 07/11/18 1450  . Chlorhexidine Gluconate Cloth 2 % PADS 6 each  6 each Topical Q0600 Maurilio Lovely D, DO   6 each at 07/11/18 0554  . diltiazem (CARDIZEM) tablet 30 mg  30 mg Oral Q6H Shah, Pratik D, DO   30 mg at 07/11/18 1151  . DULoxetine (CYMBALTA) DR capsule 60 mg  60 mg Oral Daily Pearson Grippe, MD   60 mg at 07/11/18 0927  . feeding supplement (PRO-STAT SUGAR FREE 64) liquid 30 mL  30 mL Oral BID Pearson Grippe, MD   30 mL at 07/09/18 0934  . fenofibrate tablet 160 mg  160 mg Oral Daily Pearson Grippe, MD   160 mg at 07/11/18 0941  . heparin ADULT infusion 100 units/mL (25000 units/23mL sodium chloride 0.45%)  1,450 Units/hr Intravenous Continuous Coffee, Gerre Pebbles, RPH 14.5 mL/hr at 07/11/18 0149 1,450 Units/hr at 07/11/18 0149  . hydrALAZINE (APRESOLINE)  injection 10 mg  10 mg Intravenous Q4H PRN Sherryll Burger, Pratik D, DO      . ipratropium-albuterol (DUONEB) 0.5-2.5 (3) MG/3ML nebulizer solution 3 mL  3 mL Nebulization Q6H PRN  Sherryll Burger, Pratik D, DO      . lidocaine (LIDODERM) 5 % 1 patch  1 patch Transdermal Q24H Pearson Grippe, MD   1 patch at 07/10/18 0003  . metoprolol tartrate (LOPRESSOR) tablet 25 mg  25 mg Oral BID Sherryll Burger, Pratik D, DO   25 mg at 07/11/18 5110  . nicotine (NICODERM CQ - dosed in mg/24 hours) patch 21 mg  21 mg Transdermal Daily Pearson Grippe, MD   21 mg at 07/11/18 0933  . ondansetron (ZOFRAN) tablet 4 mg  4 mg Oral Q4H PRN Pearson Grippe, MD   4 mg at 07/10/18 1028  . pantoprazole (PROTONIX) EC tablet 40 mg  40 mg Oral Q1200 Tiffany Kocher, PA-C   40 mg at 07/11/18 0554  . potassium chloride SA (K-DUR,KLOR-CON) CR tablet 40 mEq  40 mEq Oral Daily Sherryll Burger, Pratik D, DO   40 mEq at 07/11/18 2111  . pregabalin (LYRICA) capsule 200 mg  200 mg Oral TID Sherryll Burger, Pratik D, DO   200 mg at 07/11/18 7356  . senna-docusate (Senokot-S) tablet 2 tablet  2 tablet Oral QHS PRN Pearson Grippe, MD      . tiZANidine (ZANAFLEX) tablet 4-8 mg  4-8 mg Oral Q8H PRN Sherryll Burger, Pratik D, DO   4 mg at 07/11/18 1451    ROS:   General:  + weight loss,- Fever,- chills  HEENT: No recent headaches, no nasal bleeding, no visual changes, no sore throat  Neurologic: No dizziness, blackouts, seizures. No recent symptoms of stroke or mini- stroke. No recent episodes of slurred speech, or temporary blindness.  Cardiac: No recent episodes of chest pain/pressure, no shortness of breath at rest.  No shortness of breath with exertion.    Vascular: No history of rest pain in feet.  No history of claudication.  No history of non-healing ulcer, No history of DVT   Pulmonary: No home oxygen, no productive cough, no hemoptysis,  No asthma or wheezing  Musculoskeletal:  [ ]  Arthritis, [ ]  Low back pain,  [ ]  Joint pain  Hematologic:No history of hypercoagulable state.  No history of easy  bleeding.  No history of anemia  Gastrointestinal: No hematochezia or melena,  + gastroesophageal reflux, no trouble swallowing  Urinary: [ ]  chronic Kidney disease, [ ]  on HD - [ ]  MWF or [ ]  TTHS, [ ]  Burning with urination, [ ]  Frequent urination, [ ]  Difficulty urinating;   Skin: No rashes  Psychological: No history of anxiety,  No history of depression   Physical Examination  Vitals:   07/11/18 0900 07/11/18 1100 07/11/18 1141 07/11/18 1410  BP: (!) 148/64 139/79  (!) 111/93  Pulse: 67 60 76 76  Resp: 19 13 13 19   Temp:   98.2 F (36.8 C) 98 F (36.7 C)  TempSrc:   Oral Oral  SpO2: 97% 99% 97% 94%  Weight:      Height:        Body mass index is 23.1 kg/m.  General:  Alert and oriented, no acute distress has some confusion regarding her medical history HEENT: Normal Neck: No JVD Pulmonary: Clear to auscultation bilaterally Cardiac: irregularly irregular Abdomen: Soft, non-tender, non-distended, no mass Skin: No rash, 2 dry gangrenous toe tips left foot Extremity Pulses:  2+ radial, brachial, femoral, absent dorsalis pedis, posterior tibial pulses bilaterally Musculoskeletal: No deformity or edema  Neurologic: Upper and lower extremity motor 5/5 and symmetric  DATA:  I reviewed the CT images from February and April with findings  as per HPI above.  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  ASSESSMENT:   1. Chronic nausea vomiting weight loss but not really classic symptoms of post prandial pain or food fear.  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  She does have about a 30-50% stenosis of the SMA but 50% or less stenosis should not be flow limiting.  She has mainly irritated descending and transverse colon which is IMA and SMA distribution.  It would be  exceedingly rare for celiac occlusion to cause colon ischemia.  Since her main symptoms are nausea and vomiting I would favor original plan of GI [facility] with upper endoscopy.  If this is negative we could consider contrast angiogram of the mesenteric vessels to further define anatomy.  However, the mainstay of therapy for ischemic colitis is medical not surgical with bowel rest and antibiotics with colon resection if the segment infarcts, not vascular intervention.  Will follow.  If GI workup/endoscopy turns up negative will consider angio.   Fabienne Bruns, MD [facility] Office: [phone] Pager: [phone]

## 2018-07-12 DIAGNOSIS — R945 Abnormal results of liver function studies: Secondary | ICD-10-CM

## 2018-07-12 DIAGNOSIS — W19XXXA Unspecified fall, initial encounter: Secondary | ICD-10-CM

## 2018-07-12 LAB — CBC
HCT: 29.4 % — ABNORMAL LOW (ref 36.0–46.0)
Hemoglobin: 8.8 g/dL — ABNORMAL LOW (ref 12.0–15.0)
MCH: 24.7 pg — ABNORMAL LOW (ref 26.0–34.0)
MCHC: 29.9 g/dL — ABNORMAL LOW (ref 30.0–36.0)
MCV: 82.6 fL (ref 80.0–100.0)
Platelets: 426 10*3/uL — ABNORMAL HIGH (ref 150–400)
RBC: 3.56 MIL/uL — ABNORMAL LOW (ref 3.87–5.11)
RDW: 18 % — ABNORMAL HIGH (ref 11.5–15.5)
WBC: 23.7 10*3/uL — ABNORMAL HIGH (ref 4.0–10.5)
nRBC: 0.3 % — ABNORMAL HIGH (ref 0.0–0.2)

## 2018-07-12 LAB — BASIC METABOLIC PANEL
Anion gap: 14 (ref 5–15)
BUN: 8 mg/dL (ref 8–23)
CO2: 20 mmol/L — ABNORMAL LOW (ref 22–32)
Calcium: 8.3 mg/dL — ABNORMAL LOW (ref 8.9–10.3)
Chloride: 99 mmol/L (ref 98–111)
Creatinine, Ser: 0.72 mg/dL (ref 0.44–1.00)
GFR calc Af Amer: >60 mL/min (ref >60)
GFR calc non Af Amer: >60 mL/min (ref >60)
Glucose, Bld: 87 mg/dL (ref 70–99)
Potassium: 4.5 mmol/L (ref 3.5–5.1)
Sodium: 133 mmol/L — ABNORMAL LOW (ref 135–145)

## 2018-07-12 LAB — CULTURE, BLOOD (ROUTINE X 2)
Culture: NO GROWTH
Culture: NO GROWTH
Special Requests: ADEQUATE
Special Requests: ADEQUATE

## 2018-07-12 LAB — MAGNESIUM: Magnesium: 1.7 mg/dL (ref 1.7–2.4)

## 2018-07-12 LAB — HEPARIN LEVEL (UNFRACTIONATED): Heparin Unfractionated: 0.6 IU/mL (ref 0.30–0.70)

## 2018-07-12 NOTE — Progress Notes (Signed)
ANTICOAGULATION CONSULT NOTE   Pharmacy Consult for heparin  Indication: atrial fibrillation  No Known Allergies  Patient Measurements: Height: 5\' 7"  (170.2 cm) Weight: 145 lb 11.6 oz (66.1 kg) IBW/kg (Calculated) : 61.6  HEPARIN DW (KG): 66.1   Vital Signs: Temp: 98.1 F (36.7 C) (04/12 0311) Temp Source: Oral (04/12 0311) BP: 111/59 (04/12 0311) Pulse Rate: 61 (04/12 0311)  Labs: Recent Labs    07/10/18 1410 07/10/18 1830 07/11/18 0446 07/11/18 1949 07/12/18 0356  HGB  --   --  9.8* 9.4* 8.8*  HCT  --   --  31.9* 30.0* 29.4*  PLT  --   --  471* 438* 426*  APTT 36 69* 100*  --   --   LABPROT 16.1*  --   --   --   --   INR 1.3*  --   --   --   --   HEPARINUNFRC  --  0.61 0.63  --  0.60  CREATININE  --   --  0.48 0.62 0.72    Estimated Creatinine Clearance: 62.7 mL/min (by C-G formula based on SCr of 0.72 mg/dL).   Assessment: 73 y.o. female with h/o Afib, Eliquis on hold, for heparin. Last apixaban dose was 10am on 07/08/18.Hgb stable, plt stable. Heparin level and APTT appear to be correlating.  Heparin level therapeutic at 0.60, H/H low, plts 426, no bleeding reported at this time.  Goal of Therapy:  Heparin level 0.3-0.7 units/ml Monitor platelets by anticoagulation protocol: Yes   Plan:  Continue heparin gtt at 1450 units/hr Daily heparin level, CBC, s/s bleeding  Daylene Posey, PharmD Clinical Pharmacist Please check AMION for all [facility] numbers 07/12/2018 8:06 AM

## 2018-07-12 NOTE — Progress Notes (Signed)
This RN spoke with patients daughter a few times over the course of the afternoon. Patients daughter with concerns about transfer back to [facility] . This RN spoke with AC/house coverage and MD on call.  Decision was made to keep patient here at [facility] today. Daughter updated and understood. Will continue to monitor patient. Dakhari Zuver, Randall An RN

## 2018-07-12 NOTE — Progress Notes (Signed)
Physical Therapy Treatment Patient Details Name: [name] MRN: [id number] DOB: Mar 12, 1946 Today's Date: 07/12/2018    History of Present Illness  [name] is a 73 y.o. female with medical history significant of chronic back pain, GERD, hyperlipidemia, hypertension, left knee osteoarthritis, palpitations, PVD h/o Aortofem bypass, s/p RLE thromboembolectomy and R common emoral artery patch angioplasty 05/31/18, history of tobacco use who presents to ER due to fall at home.  She just was discharged from [facility] yesterday.    PT Comments    Patient sleeping upon entry, agreeable to work with therapy. Stood EOB and side stepped with premature sit and loss of balance. Will need to progress to HHPT over subsequent PT visits. Per MD transferring back to [facility] today.     Follow Up Recommendations  Home health PT;Supervision - Intermittent;Supervision for mobility/OOB     Equipment Recommendations  None recommended by PT    Recommendations for Other Services       Precautions / Restrictions Precautions Precautions: Fall Restrictions Weight Bearing Restrictions: No    Mobility  Bed Mobility Overal bed mobility: Modified Independent Bed Mobility: Supine to Sit     Supine to sit: Supervision        Transfers Overall transfer level: Modified independent Equipment used: Rolling walker (2 wheeled) Transfers: Sit to/from Stand Sit to Stand: Min guard Stand pivot transfers: Min guard       General transfer comment: pt side steping along bed with premature sit and LOB   Ambulation/Gait                 Stairs             Wheelchair Mobility    Modified Rankin (Stroke Patients Only)       Balance Overall balance assessment: Needs assistance Sitting-balance support: Feet supported;No upper extremity supported Sitting balance-Leahy Scale: Good     Standing balance support: During functional activity;No upper extremity supported Standing  balance-Leahy Scale: Fair Standing balance comment: fair/good using RW                            Cognition Arousal/Alertness: Awake/alert Behavior During Therapy: WFL for tasks assessed/performed Overall Cognitive Status: Within Functional Limits for tasks assessed                                        Exercises      General Comments        Pertinent Vitals/Pain Pain Assessment: Faces Faces Pain Scale: Hurts little more Pain Location: RLE Pain Descriptors / Indicators: Sore;Discomfort Pain Intervention(s): Limited activity within patient's tolerance    Home Living                      Prior Function            PT Goals (current goals can now be found in the care plan section) Acute Rehab PT Goals Patient Stated Goal: return home with family to assist PT Goal Formulation: With patient Time For Goal Achievement: 07/26/18 Progress towards PT goals: Progressing toward goals    Frequency    Min 3X/week      PT Plan Current plan remains appropriate    Co-evaluation              AM-PAC PT "6 Clicks" Mobility   Outcome Measure  Help needed turning from your back to your side while in a flat bed without using bedrails?: None Help needed moving from lying on your back to sitting on the side of a flat bed without using bedrails?: None Help needed moving to and from a bed to a chair (including a wheelchair)?: A Little Help needed standing up from a chair using your arms (e.g., wheelchair or bedside chair)?: None Help needed to walk in hospital room?: A Little Help needed climbing 3-5 steps with a railing? : A Lot 6 Click Score: 20    End of Session Equipment Utilized During Treatment: Gait belt Activity Tolerance: Patient tolerated treatment well;Patient limited by fatigue Patient left: with call bell/phone within reach;in bed Nurse Communication: Mobility status PT Visit Diagnosis: Unsteadiness on feet (R26.81);Other  abnormalities of gait and mobility (R26.89);Muscle weakness (generalized) (M62.81)     Time: 1140-1200 PT Time Calculation (min) (ACUTE ONLY): 20 min  Charges:  $Therapeutic Activity: 8-22 mins                    Etta GrandchildSean Zhanae Proffit, PT, DPT Acute Rehabilitation Services Pager: [phone] Office: [phone]     Etta GrandchildSean Lois Ostrom 07/12/2018, 11:54 AM

## 2018-07-12 NOTE — Progress Notes (Signed)
Pt did not void all night long, when asked if she needed to, pt said yes but she was unable to urinate. Bladder scan showed >397.  Pt was feeling pressure, in and out cath done with 750 cc urine output. Will continue to monitor.

## 2018-07-12 NOTE — Progress Notes (Addendum)
PROGRESS NOTE    [name]  ZOX:[id number] DOB: 01-30-1946 DOA: 07/07/2018 PCP: Junie Spencer, [name]    Brief Narrative:  73 y.o. female, with a several year history of frequent nausea and vomiting after meals.  She states that in the past she was told to take rolaids or xanax.  She states the rolaids help the xanax didn't.  She denies food fear.  She has had 30lb wt loss over the last several months.  She states that two out of every 3 meals she will become nauseous and sometimes vomit.  She has also had some diarrhea recently. She does have afib and has been on chronic anticoagulation therapy. She was admitted to [facility] 5 days ago with somnolence and a diagnosis of sepsis. Albumin was 2.1.  She has had a prior aortobifem by Dr Hart Rochester in 2002 and had thrombectomy of the right limb of this by Dr Randie Heinz 3/20.  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 Her left internal iliac artery was patent at that point.  Repeat CT on 07/09/18 is unchanged but not dedicated arterial phase so difficult to know if internal iliac arteries are patent.    Interventional radiology at [facility] was consulted and consideration was being given for stenting her SMA which has about a (30-50% stenosis) to improve mesenteric blood flow.  Other medical problems include    Assessment & Plan:   Principal Problem:   Sepsis (HCC) Active Problems:   Hyperlipidemia with target LDL less than 100   Essential hypertension   Peripheral vascular disease (HCC)   Severe protein-calorie malnutrition (HCC)   Abnormal liver function   Loss of weight   Nausea without vomiting  1. Sepsis secondary to questionable pneumonia-improving. Continue with treatment on cefepimewith blood culture results demonstrating no growth as of yet. 1. Afebrile this AM 2. WBC improved to 23k from 29k yesterday 2. Poor oral intake due to  intractable nausea and vomiting and abdominal pain with associated weight loss. 1. Suspected secondary to mesenteric ischemia and pancreatic pseudocyst. 2. [facility] initially discussed with IR who initially planned stent/revascularization on 4/13 on transfer to [facility] 3. IR later realized patient is well known to Vascular Surgery service and Vascular Surgery consulted and evaluated 4. Vascular Surgery recommended EGD/GI work up before considering revascularization since location and degree of SMA stenosis did not correlate with patient's symptoms or presentation 5. On 4/12, I discussed case with Dr. Marina Goodell, GI on call at [facility] for possible EGD while here. Per GI, since pt was already seen and is known by GI at [facility] and since patient was already planned to return to [facility] later, recommendation was to return to [facility] for further GI work up by [facility] 6. Updated family member over phone. Will place transfer to return to [facility] 3. Severe hypokalemia-corrected. Would repeat bmet in AM 4. PVD with dry gangrene.Continued on Eliquis on discharge. 5. Dyslipidemia. Continue on fenofibrate and atorvastatin.Stable at present 6. COPD with tobacco abuse. Duo nebs every6hours as needed and nicotine patch. Smoking cessation advised. 7. Essential hypertension. Hypotensive overnight with metoprolol and cardizem held. BP now improved. HR stable. Continue to monitor on tele 8. GERD. Maalox as needed.Started on PPI. 9. Abnormal LFTs-stable.Right upper quadrant ultrasound with no acute findings.Recommend continued oupt w/u.  10. Fall at home with weakness. PT recommendations for home health  on discharge. Seems stable 11. Atrial fibrillation- likely new onset. Maintain on heparin drip for now and 2D echocardiogramwith LVEF 50 to 55%. Cardizem and beta blocker held for now given hypotension and bradycardia overnight. HR and BP now much improved  DVT prophylaxis: Heparin gtt Code Status: Full Family  Communication: Updated family over phone Disposition Plan: Uncertain at this time  Consultants:   GI  Vascular Surgery  IR  Discussed with Dr. Marina GoodellPerry, [facility]  Procedures:     Antimicrobials: Anti-infectives (From admission, onward)   Start     Dose/Rate Route Frequency Ordered Stop   07/08/18 2300  vancomycin (VANCOCIN) 1,500 mg in sodium chloride 0.9 % 500 mL IVPB  Status:  Discontinued     1,500 mg 250 mL/hr over 120 Minutes Intravenous  Once 07/07/18 2128 07/07/18 2129   07/08/18 2300  vancomycin (VANCOCIN) 1,500 mg in sodium chloride 0.9 % 500 mL IVPB  Status:  Discontinued     1,500 mg 250 mL/hr over 120 Minutes Intravenous Every 24 hours 07/07/18 2129 07/08/18 0726   07/08/18 0600  ceFEPIme (MAXIPIME) 2 g in sodium chloride 0.9 % 100 mL IVPB     2 g 200 mL/hr over 30 Minutes Intravenous Every 8 hours 07/07/18 2124     07/07/18 2130  vancomycin (VANCOCIN) 1,500 mg in sodium chloride 0.9 % 500 mL IVPB     1,500 mg 250 mL/hr over 120 Minutes Intravenous  Once 07/07/18 2123 07/08/18 0028   07/07/18 2115  vancomycin (VANCOCIN) IVPB 1000 mg/200 mL premix  Status:  Discontinued     1,000 mg 200 mL/hr over 60 Minutes Intravenous  Once 07/07/18 2110 07/07/18 2128   07/07/18 2115  ceFEPIme (MAXIPIME) 2 g in sodium chloride 0.9 % 100 mL IVPB     2 g 200 mL/hr over 30 Minutes Intravenous  Once 07/07/18 2110 07/07/18 2203       Subjective: Confused this AM  Objective: Vitals:   07/11/18 2043 07/12/18 0311 07/12/18 0400 07/12/18 1008  BP: (!) 98/59 (!) 111/59  132/76  Pulse: 63 61  77  Resp: 20 20  20   Temp: 99.6 F (37.6 C) 98.1 F (36.7 C)  97.8 F (36.6 C)  TempSrc: Axillary Oral  Oral  SpO2: 98% 98%  96%  Weight:   66.1 kg   Height:        Intake/Output Summary (Last 24 hours) at 07/12/2018 1020 Last data filed at 07/12/2018 0604 Gross per 24 hour  Intake 481 ml  Output 750 ml  Net -269 ml   Filed Weights   07/10/18 0500 07/11/18 0500 07/12/18 0400   Weight: 64.6 kg 66.9 kg 66.1 kg    Examination:  General exam: Appears calm and comfortable, laying in bed  Respiratory system: Clear to auscultation. Respiratory effort normal. Cardiovascular system: S1 & S2 heard, RRR. Gastrointestinal system: Abdomen is nondistended, soft and nontender. No organomegaly or masses felt. Normal bowel sounds heard. Central nervous system: Alert and oriented. No focal neurological deficits. Extremities: Symmetric 5 x 5 power. Skin: No rashes, lesions  Psychiatry: Appears confused  Data Reviewed: I have personally reviewed following labs and imaging studies  CBC: Recent Labs  Lab 07/07/18 1845  07/09/18 0536 07/10/18 0536 07/11/18 0446 07/11/18 1949 07/12/18 0356  WBC 27.4*   < > 23.2* 25.6* 19.9* 29.1* 23.7*  NEUTROABS 22.6*  --   --   --   --   --   --   HGB 10.9*   < >  10.5* 10.8* 9.8* 9.4* 8.8*  HCT 35.7*   < > 35.0* 35.3* 31.9* 30.0* 29.4*  MCV 84.6   < > 84.7 84.2 83.1 82.4 82.6  PLT 424*   < > 441* 474* 471* 438* 426*   < > = values in this interval not displayed.   Basic Metabolic Panel: Recent Labs  Lab 07/09/18 0536 07/10/18 0536 07/11/18 0446 07/11/18 1949 07/12/18 0356  NA 133* 134* 132* 130* 133*  K 2.5* 3.3* 2.9* 5.0 4.5  CL 102 102 98 98 99  CO2 21* 20* 23 21* 20*  GLUCOSE 94 110* 89 116* 87  BUN 12 8 7* 7* 8  CREATININE 0.51 0.54 0.48 0.62 0.72  CALCIUM 8.1* 8.5* 7.8* 8.1* 8.3*  MG  --  1.7  --  1.5* 1.7   GFR: Estimated Creatinine Clearance: 62.7 mL/min (by C-G formula based on SCr of 0.72 mg/dL). Liver Function Tests: Recent Labs  Lab 07/07/18 1845 07/08/18 0406 07/09/18 0536 07/11/18 1949  AST 50* 41 44* 36  ALT 21 18 18 14   ALKPHOS 150* 131* 134* 119  BILITOT 0.9 0.8 0.8 0.8  PROT 6.3* 5.6* 5.9* 5.3*  ALBUMIN 2.1* 1.9* 1.9* 1.6*   Recent Labs  Lab 07/08/18 1325  LIPASE 65*   No results for input(s): AMMONIA in the last 168 hours. Coagulation Profile: Recent Labs  Lab 07/07/18 1846 07/10/18  1410  INR 1.7* 1.3*   Cardiac Enzymes: Recent Labs  Lab 07/07/18 1845  CKTOTAL 30*  TROPONINI <0.03   BNP (last 3 results) No results for input(s): PROBNP in the last 8760 hours. HbA1C: No results for input(s): HGBA1C in the last 72 hours. CBG: Recent Labs  Lab 07/07/18 1951 07/11/18 1715  GLUCAP 150* 124*   Lipid Profile: No results for input(s): CHOL, HDL, LDLCALC, TRIG, CHOLHDL, LDLDIRECT in the last 72 hours. Thyroid Function Tests: No results for input(s): TSH, T4TOTAL, FREET4, T3FREE, THYROIDAB in the last 72 hours. Anemia Panel: No results for input(s): VITAMINB12, FOLATE, FERRITIN, TIBC, IRON, RETICCTPCT in the last 72 hours. Sepsis Labs: Recent Labs  Lab 07/08/18 0052 07/08/18 0406 07/09/18 0536 07/10/18 0536 07/11/18 0446  PROCALCITON  --  0.69 0.47 0.76  --   LATICACIDVEN 1.4 1.3 1.1  --  1.0    Recent Results (from the past 240 hour(s))  Blood culture (routine x 2)     Status: None   Collection Time: 07/07/18  8:02 PM  Result Value Ref Range Status   Specimen Description BLOOD RIGHT ANTECUBITAL  Final   Special Requests   Final    BOTTLES DRAWN AEROBIC AND ANAEROBIC Blood Culture adequate volume   Culture   Final    NO GROWTH 5 DAYS Performed at [facility], [address] [place], [place] [address]    Report Status 07/12/2018 FINAL  Final  Blood culture (routine x 2)     Status: None   Collection Time: 07/07/18  8:07 PM  Result Value Ref Range Status   Specimen Description BLOOD RIGHT HAND  Final   Special Requests   Final    BOTTLES DRAWN AEROBIC AND ANAEROBIC Blood Culture adequate volume   Culture   Final    NO GROWTH 5 DAYS Performed at [facility], [address], [place], [place] [address]    Report Status 07/12/2018 FINAL  Final  MRSA PCR Screening     Status: None   Collection Time: 07/08/18 12:37 AM  Result Value Ref Range Status   MRSA by  PCR NEGATIVE NEGATIVE Final    Comment:        The GeneXpert MRSA Assay (FDA approved  for NASAL specimens only), is one component of a comprehensive MRSA colonization surveillance program. It is not intended to diagnose MRSA infection nor to guide or monitor treatment for MRSA infections. Performed at [facility], [address] [place], [place] [address]      Radiology Studies: Dg [facility] 1 View  Result Date: 07/11/2018 CLINICAL DATA:  Hypoxemia. EXAM: PORTABLE CHEST 1 VIEW COMPARISON:  Radiograph of July 07, 2018. FINDINGS: The heart size and mediastinal contours are within normal limits. Both lungs are clear. No pneumothorax or pleural effusion is noted. The visualized skeletal structures are unremarkable. IMPRESSION: No active disease. Electronically Signed   By: Lupita Raider, M.D.   On: 07/11/2018 16:31    Scheduled Meds: . atorvastatin  40 mg Oral Daily  . Chlorhexidine Gluconate Cloth  6 each Topical Q0600  . feeding supplement (PRO-STAT SUGAR FREE 64)  30 mL Oral BID  . folic acid  1 mg Oral Daily  . lidocaine  1 patch Transdermal Q24H  . LORazepam  0-4 mg Intravenous Q6H   Followed by  . [START ON 07/13/2018] LORazepam  0-4 mg Intravenous Q12H  . multivitamin with minerals  1 tablet Oral Daily  . nicotine  21 mg Transdermal Daily  . pantoprazole  40 mg Oral Q1200  . potassium chloride  40 mEq Oral Daily  . pregabalin  200 mg Oral TID  . thiamine  100 mg Oral Daily   Or  . thiamine  100 mg Intravenous Daily   Continuous Infusions: . sodium chloride 100 mL (07/09/18 0639)  . ceFEPime (MAXIPIME) IV 2 g (07/12/18 0604)  . heparin 1,450 Units/hr (07/11/18 1935)     LOS: 5 days   Rickey Barbara, MD [facility] Pager On Amion  If 7PM-7AM, please contact night-coverage 07/12/2018, 10:20 AM

## 2018-07-13 DIAGNOSIS — E43 Unspecified severe protein-calorie malnutrition: Secondary | ICD-10-CM

## 2018-07-13 DIAGNOSIS — K551 Chronic vascular disorders of intestine: Secondary | ICD-10-CM

## 2018-07-13 DIAGNOSIS — K559 Vascular disorder of intestine, unspecified: Secondary | ICD-10-CM

## 2018-07-13 LAB — CBC
HCT: 34.3 % — ABNORMAL LOW (ref 36.0–46.0)
Hemoglobin: 10.2 g/dL — ABNORMAL LOW (ref 12.0–15.0)
MCH: 24.9 pg — ABNORMAL LOW (ref 26.0–34.0)
MCHC: 29.7 g/dL — ABNORMAL LOW (ref 30.0–36.0)
MCV: 83.9 fL (ref 80.0–100.0)
Platelets: 438 10*3/uL — ABNORMAL HIGH (ref 150–400)
RBC: 4.09 MIL/uL (ref 3.87–5.11)
RDW: 18 % — ABNORMAL HIGH (ref 11.5–15.5)
WBC: 19.8 10*3/uL — ABNORMAL HIGH (ref 4.0–10.5)
nRBC: 0.4 % — ABNORMAL HIGH (ref 0.0–0.2)

## 2018-07-13 LAB — HEPARIN LEVEL (UNFRACTIONATED): Heparin Unfractionated: 0.54 IU/mL (ref 0.30–0.70)

## 2018-07-13 MED ORDER — MAGNESIUM CITRATE PO SOLN
1.0000 | Freq: Once | ORAL | Status: AC
  Administered 2018-07-14: 1 via ORAL
  Filled 2018-07-13: qty 296

## 2018-07-13 MED ORDER — METOPROLOL TARTRATE 25 MG PO TABS
25.0000 mg | ORAL_TABLET | Freq: Two times a day (BID) | ORAL | Status: DC
  Administered 2018-07-13 (×2): 25 mg via ORAL
  Filled 2018-07-13 (×2): qty 1

## 2018-07-13 MED ORDER — LABETALOL HCL 5 MG/ML IV SOLN: 5.0000 mg | INTRAVENOUS | Status: DC | PRN

## 2018-07-13 NOTE — Progress Notes (Signed)
PROGRESS NOTE    [name]  RUE:[id number] DOB: 1945/11/07 DOA: 07/07/2018 PCP: Junie Spencer, [name]    Brief Narrative:  73 y.o. female, with a several year history of frequent nausea and vomiting after meals.  She states that in the past she was told to take rolaids or xanax.  She states the rolaids help the xanax didn't.  She denies food fear.  She has had 30lb wt loss over the last several months.  She states that two out of every 3 meals she will become nauseous and sometimes vomit.  She has also had some diarrhea recently. She does have afib and has been on chronic anticoagulation therapy. She was admitted to [facility] 5 days ago with somnolence and a diagnosis of sepsis. Albumin was 2.1.  She has had a prior aortobifem by Dr Hart Rochester in 2002 and had thrombectomy of the right limb of this by Dr Randie Heinz 3/20.  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 Her left internal iliac artery was patent at that point.  Repeat CT on 07/09/18 is unchanged but not dedicated arterial phase so difficult to know if internal iliac arteries are patent.    Interventional radiology at [facility] was consulted and consideration was being given for stenting her SMA which has about a (30-50% stenosis) to improve mesenteric blood flow.  Other medical problems include    Assessment & Plan:   Principal Problem:   Sepsis (HCC) Active Problems:   Hyperlipidemia with target LDL less than 100   Essential hypertension   Peripheral vascular disease (HCC)   Severe protein-calorie malnutrition (HCC)   Abnormal liver function   Loss of weight   Nausea without vomiting  1. Sepsis secondary to questionable pneumonia-improving.. 1. Remains afebrile 2. WBC improved to 19.8k 3. Completed 7 days of cefepime 2. Poor oral intake due to intractable nausea and vomiting and abdominal pain with associated weight loss. 1.  Suspected secondary to mesenteric ischemia and pancreatic pseudocyst. 2. [facility] initially discussed with IR who initially planned stent/revascularization on 4/13 on transfer to [facility] 3. IR later realized patient is well known to Vascular Surgery service and Vascular Surgery consulted and evaluated 4. Vascular Surgery recommended EGD/GI work up before considering revascularization since location and degree of SMA stenosis did not correlate with patient's symptoms or presentation 5. On 4/12, I discussed case with Dr. Marina Goodell, GI on call at [facility] for possible EGD while here. Per GI, since pt was already seen and is known by GI at [facility] and since patient was already planned to return to [facility] later, recommendation was to return to [facility] for further GI work up by [facility] 6. Pt remained overnight. Discussed with GI today, who will see formally in consultation 3. Severe hypokalemia-corrected. 1. Continue to follow bmet in AM 4. PVD with dry gangrene. 1. Continued on Eliquis on discharge. 5. Dyslipidemia.  1. Continue on fenofibrate and atorvastatin. 2. Stable at present 6. COPD with tobacco abuse.  1. Duo nebs every6hours as needed and nicotine patch. Smoking cessation advised. 2. Stable at present 7. Essential hypertension.  1. Hypotensive overnight with metoprolol and cardizem held. BP now improved. HR stable.  2. Cont current regimen 8. GERD.  1. Maalox as needed. 2. Started on PPI. 9. Abnormal LFTs-stable. 1. Right upper quadrant ultrasound with no acute findings. 2. Recommend continued oupt  w/u.  10. Fall at home with weakness.  1. PT recommendations for home health on discharge.  2. Seems stable 11. Atrial fibrillation 1. likely new onset.  2. Maintain on heparin drip for now and 2D echocardiogramwith LVEF 50 to 55%.  3. Cardizem and beta blocker initially given hypotension and bradycardia   DVT prophylaxis: Heparin gtt Code Status: Full Family Communication:  Updated family over phone Disposition Plan: Uncertain at this time  Consultants:   GI  Vascular Surgery  IR  [facility]  Procedures:     Antimicrobials: Anti-infectives (From admission, onward)   Start     Dose/Rate Route Frequency Ordered Stop   07/08/18 2300  vancomycin (VANCOCIN) 1,500 mg in sodium chloride 0.9 % 500 mL IVPB  Status:  Discontinued     1,500 mg 250 mL/hr over 120 Minutes Intravenous  Once 07/07/18 2128 07/07/18 2129   07/08/18 2300  vancomycin (VANCOCIN) 1,500 mg in sodium chloride 0.9 % 500 mL IVPB  Status:  Discontinued     1,500 mg 250 mL/hr over 120 Minutes Intravenous Every 24 hours 07/07/18 2129 07/08/18 0726   07/08/18 0600  ceFEPIme (MAXIPIME) 2 g in sodium chloride 0.9 % 100 mL IVPB     2 g 200 mL/hr over 30 Minutes Intravenous Every 8 hours 07/07/18 2124     07/07/18 2130  vancomycin (VANCOCIN) 1,500 mg in sodium chloride 0.9 % 500 mL IVPB     1,500 mg 250 mL/hr over 120 Minutes Intravenous  Once 07/07/18 2123 07/08/18 0028   07/07/18 2115  vancomycin (VANCOCIN) IVPB 1000 mg/200 mL premix  Status:  Discontinued     1,000 mg 200 mL/hr over 60 Minutes Intravenous  Once 07/07/18 2110 07/07/18 2128   07/07/18 2115  ceFEPIme (MAXIPIME) 2 g in sodium chloride 0.9 % 100 mL IVPB     2 g 200 mL/hr over 30 Minutes Intravenous  Once 07/07/18 2110 07/07/18 2203      Subjective: Denies abd pain  Objective: Vitals:   07/12/18 2145 07/13/18 0519 07/13/18 0959 07/13/18 1201  BP: 132/63 (!) 158/70 (!) 156/91 137/73  Pulse: 95  (!) 109 93  Resp: 20   14  Temp: 98.1 F (36.7 C) 98 F (36.7 C)  98.1 F (36.7 C)  TempSrc: Oral Oral  Oral  SpO2: 90%     Weight:  66.3 kg    Height:  5\' 7"  (1.702 m)      Intake/Output Summary (Last 24 hours) at 07/13/2018 1329 Last data filed at 07/13/2018 0600 Gross per 24 hour  Intake 591.5 ml  Output -  Net 591.5 ml   Filed Weights   07/11/18 0500 07/12/18 0400 07/13/18 0519  Weight: 66.9 kg 66.1 kg 66.3 kg     Examination: General exam: Conversant, in no acute distress Respiratory system: normal chest rise, clear, no audible wheezing Cardiovascular system: regular rhythm, s1-s2 Gastrointestinal system: Nondistended, nontender, pos BS Central nervous system: No seizures, no tremors Extremities: No cyanosis, no joint deformities Skin: No rashes, no pallor Psychiatry: Affect normal // no auditory hallucinations   Data Reviewed: I have personally reviewed following labs and imaging studies  CBC: Recent Labs  Lab 07/07/18 1845  07/10/18 0536 07/11/18 0446 07/11/18 1949 07/12/18 0356 07/13/18 0401  WBC 27.4*   < > 25.6* 19.9* 29.1* 23.7* 19.8*  NEUTROABS 22.6*  --   --   --   --   --   --   HGB 10.9*   < > 10.8* 9.8*  9.4* 8.8* 10.2*  HCT 35.7*   < > 35.3* 31.9* 30.0* 29.4* 34.3*  MCV 84.6   < > 84.2 83.1 82.4 82.6 83.9  PLT 424*   < > 474* 471* 438* 426* 438*   < > = values in this interval not displayed.   Basic Metabolic Panel: Recent Labs  Lab 07/09/18 0536 07/10/18 0536 07/11/18 0446 07/11/18 1949 07/12/18 0356  NA 133* 134* 132* 130* 133*  K 2.5* 3.3* 2.9* 5.0 4.5  CL 102 102 98 98 99  CO2 21* 20* 23 21* 20*  GLUCOSE 94 110* 89 116* 87  BUN 12 8 7* 7* 8  CREATININE 0.51 0.54 0.48 0.62 0.72  CALCIUM 8.1* 8.5* 7.8* 8.1* 8.3*  MG  --  1.7  --  1.5* 1.7   GFR: Estimated Creatinine Clearance: 62.7 mL/min (by C-G formula based on SCr of 0.72 mg/dL). Liver Function Tests: Recent Labs  Lab 07/07/18 1845 07/08/18 0406 07/09/18 0536 07/11/18 1949  AST 50* 41 44* 36  ALT 21 18 18 14   ALKPHOS 150* 131* 134* 119  BILITOT 0.9 0.8 0.8 0.8  PROT 6.3* 5.6* 5.9* 5.3*  ALBUMIN 2.1* 1.9* 1.9* 1.6*   Recent Labs  Lab 07/08/18 1325  LIPASE 65*   No results for input(s): AMMONIA in the last 168 hours. Coagulation Profile: Recent Labs  Lab 07/07/18 1846 07/10/18 1410  INR 1.7* 1.3*   Cardiac Enzymes: Recent Labs  Lab 07/07/18 1845  CKTOTAL 30*  TROPONINI <0.03    BNP (last 3 results) No results for input(s): PROBNP in the last 8760 hours. HbA1C: No results for input(s): HGBA1C in the last 72 hours. CBG: Recent Labs  Lab 07/07/18 1951 07/11/18 1715  GLUCAP 150* 124*   Lipid Profile: No results for input(s): CHOL, HDL, LDLCALC, TRIG, CHOLHDL, LDLDIRECT in the last 72 hours. Thyroid Function Tests: No results for input(s): TSH, T4TOTAL, FREET4, T3FREE, THYROIDAB in the last 72 hours. Anemia Panel: No results for input(s): VITAMINB12, FOLATE, FERRITIN, TIBC, IRON, RETICCTPCT in the last 72 hours. Sepsis Labs: Recent Labs  Lab 07/08/18 0052 07/08/18 0406 07/09/18 0536 07/10/18 0536 07/11/18 0446  PROCALCITON  --  0.69 0.47 0.76  --   LATICACIDVEN 1.4 1.3 1.1  --  1.0    Recent Results (from the past 240 hour(s))  Blood culture (routine x 2)     Status: None   Collection Time: 07/07/18  8:02 PM  Result Value Ref Range Status   Specimen Description BLOOD RIGHT ANTECUBITAL  Final   Special Requests   Final    BOTTLES DRAWN AEROBIC AND ANAEROBIC Blood Culture adequate volume   Culture   Final    NO GROWTH 5 DAYS Performed at [facility], [address] [place], [place] [address]    Report Status 07/12/2018 FINAL  Final  Blood culture (routine x 2)     Status: None   Collection Time: 07/07/18  8:07 PM  Result Value Ref Range Status   Specimen Description BLOOD RIGHT HAND  Final   Special Requests   Final    BOTTLES DRAWN AEROBIC AND ANAEROBIC Blood Culture adequate volume   Culture   Final    NO GROWTH 5 DAYS Performed at [facility], [address] [place], [place] [address]    Report Status 07/12/2018 FINAL  Final  MRSA PCR Screening     Status: None   Collection Time: 07/08/18 12:37 AM  Result Value Ref Range Status   MRSA by PCR NEGATIVE  NEGATIVE Final    Comment:        The GeneXpert MRSA Assay (FDA approved for NASAL specimens only), is one component of a comprehensive MRSA colonization surveillance program. It is  not intended to diagnose MRSA infection nor to guide or monitor treatment for MRSA infections. Performed at [facility], [address] [place], [place] [address]      Radiology Studies: No results found.  Scheduled Meds: . atorvastatin  40 mg Oral Daily  . Chlorhexidine Gluconate Cloth  6 each Topical Q0600  . feeding supplement (PRO-STAT SUGAR FREE 64)  30 mL Oral BID  . folic acid  1 mg Oral Daily  . lidocaine  1 patch Transdermal Q24H  . LORazepam  0-4 mg Intravenous Q6H   Followed by  . LORazepam  0-4 mg Intravenous Q12H  . metoprolol tartrate  25 mg Oral BID  . multivitamin with minerals  1 tablet Oral Daily  . nicotine  21 mg Transdermal Daily  . pantoprazole  40 mg Oral Q1200  . potassium chloride  40 mEq Oral Daily  . pregabalin  200 mg Oral TID  . thiamine  100 mg Oral Daily   Or  . thiamine  100 mg Intravenous Daily   Continuous Infusions: . sodium chloride 100 mL (07/09/18 0639)  . ceFEPime (MAXIPIME) IV 2 g (07/13/18 0522)  . heparin 1,450 Units/hr (07/13/18 0758)     LOS: 6 days   Rickey Barbara, MD [facility] Pager On Amion  If 7PM-7AM, please contact night-coverage 07/13/2018, 1:29 PM

## 2018-07-13 NOTE — Consult Note (Signed)
[facility]  Subjective  - eating some crackers still some nausea   Objective 137/73 93 98.1 F (36.7 C) (Oral) 14 90%  Intake/Output Summary (Last 24 hours) at 07/13/2018 1652 Last data filed at 07/13/2018 1500 Gross per 24 hour  Intake 1082 ml  Output -  Net 1082 ml   Mild abdominal pain  Assessment/Planning: Awaiting GI eval if endo is negative will proceed with agram   Fabienne Bruns 07/13/2018 4:52 PM --  Laboratory Lab Results: Recent Labs    07/12/18 0356 07/13/18 0401  WBC 23.7* 19.8*  HGB 8.8* 10.2*  HCT 29.4* 34.3*  PLT 426* 438*   BMET Recent Labs    07/11/18 1949 07/12/18 0356  NA 130* 133*  K 5.0 4.5  CL 98 99  CO2 21* 20*  GLUCOSE 116* 87  BUN 7* 8  CREATININE 0.62 0.72  CALCIUM 8.1* 8.3*    COAG Lab Results  Component Value Date   INR 1.3 (H) 07/10/2018   INR 1.7 (H) 07/07/2018   INR 1.05 05/17/2009   No results found for: PTT

## 2018-07-13 NOTE — Care Management Important Message (Signed)
Important Message  Patient Details  Name: [name] MRN: [id number] Date of Birth: [birth year]   Medicare Important Message Given:  Yes    Dorena Bodo 07/13/2018, 4:36 PM

## 2018-07-13 NOTE — Progress Notes (Signed)
Physical Therapy Treatment Patient Details Name: [name] MRN: [id number] DOB: 16-Jan-1946 Today's Date: 07/13/2018    History of Present Illness  [name] is a 73 y.o. female with medical history significant of chronic back pain, GERD, hyperlipidemia, hypertension, left knee osteoarthritis, palpitations, PVD h/o Aortofem bypass, s/p RLE thromboembolectomy and R common emoral artery patch angioplasty 05/31/18, history of tobacco use who presents to ER due to fall at home.  She just was [facility].    PT Comments    Pt limited this session by extreme tachycardia, with HR up to 157 bpm with transfer to recliner. Physically, pt is moving well with bed mobility and transfers. Pt unable to ambulate this session due to tachycardia. During LE exercises, pt presenting with RLE weakness, especially in hip flexion and DF/PF. Pt also with complaints of R foot burning. Pt with limited mobility status at this time. PT to continue to follow and progress mobility as able.    Follow Up Recommendations  Home health PT;Supervision - Intermittent;Supervision for mobility/OOB     Equipment Recommendations  None recommended by PT    Recommendations for Other Services       Precautions / Restrictions Precautions Precautions: Fall Restrictions Weight Bearing Restrictions: No    Mobility  Bed Mobility Overal bed mobility: Modified Independent Bed Mobility: Supine to Sit     Supine to sit: Supervision     General bed mobility comments: Increased time, use of bedrails to come to sitting.   Transfers Overall transfer level: Needs assistance Equipment used: 1 person hand held assist Transfers: Sit to/from Stand Sit to Stand: Min assist Stand pivot transfers: Min assist       General transfer comment: Min assist for steadying upon standing with HHA. Min assist for steadying for transfer to recliner, pt able to take several small steps to get to recliner at  EOB.   Ambulation/Gait Ambulation/Gait assistance: (NT - Pt in extreme tachycardia at 150s bpm)               Stairs             Wheelchair Mobility    Modified Rankin (Stroke Patients Only)       Balance Overall balance assessment: Needs assistance Sitting-balance support: Feet supported;No upper extremity supported Sitting balance-Leahy Scale: Good     Standing balance support: During functional activity;No upper extremity supported Standing balance-Leahy Scale: Fair Standing balance comment: able to stand without UE support, pt fairly unsteady dynamically                             Cognition Arousal/Alertness: Awake/alert Behavior During Therapy: WFL for tasks assessed/performed Overall Cognitive Status: Within Functional Limits for tasks assessed                                        Exercises General Exercises - Lower Extremity Ankle Circles/Pumps: Both;AROM;AAROM;15 reps Quad Sets: AROM;Both;10 reps;Seated Hip ABduction/ADduction: AROM;Both;10 reps;Seated Straight Leg Raises: AROM;AAROM;Both;10 reps;Seated    General Comments General comments (skin integrity, edema, etc.): HR up to 157 bpm with transfer to chair, and SpO2 89-93% on RA during session.       Pertinent Vitals/Pain Pain Assessment: Faces Faces Pain Scale: Hurts even more Pain Location: RLE, especially in the bottom of her foot, with mobility Pain Descriptors / Indicators:  Burning;Discomfort Pain Intervention(s): Limited activity within patient's tolerance;Monitored during session;Repositioned    Home Living                      Prior Function            PT Goals (current goals can now be found in the care plan section) Acute Rehab PT Goals Patient Stated Goal: return home with family to assist PT Goal Formulation: With patient Time For Goal Achievement: 07/26/18 Potential to Achieve Goals: Good Progress towards PT goals: Progressing  toward goals    Frequency    Min 3X/week      PT Plan Current plan remains appropriate    Co-evaluation              AM-PAC PT "6 Clicks" Mobility   Outcome Measure  Help needed turning from your back to your side while in a flat bed without using bedrails?: A Little Help needed moving from lying on your back to sitting on the side of a flat bed without using bedrails?: A Little Help needed moving to and from a bed to a chair (including a wheelchair)?: A Little Help needed standing up from a chair using your arms (e.g., wheelchair or bedside chair)?: A Little Help needed to walk in hospital room?: A Lot Help needed climbing 3-5 steps with a railing? : A Lot 6 Click Score: 16    End of Session Equipment Utilized During Treatment: Gait belt Activity Tolerance: Patient limited by fatigue;Treatment limited secondary to medical complications (Comment)(tachycardia) Patient left: in chair;with chair alarm set;with call bell/phone within reach Nurse Communication: Other (comment)(Unable to locate RN, RN in another room. Notified MD of mobility status) PT Visit Diagnosis: Unsteadiness on feet (R26.81);Other abnormalities of gait and mobility (R26.89);Muscle weakness (generalized) (M62.81)     Time: 2353-6144 PT Time Calculation (min) (ACUTE ONLY): 18 min  Charges:  $Therapeutic Exercise: 8-22 mins                     [name] [name], PT Acute Rehabilitation Services Pager [phone]  Office [phone]   [name] 07/13/2018, 11:37 AM

## 2018-07-13 NOTE — Consult Note (Signed)
Referring Provider: [facility]  Primary Care Physician:  Junie Spencer, [name] Primary Gastroenterologist: unassigned     Reason for Consultation:  Nausea, weight loss, abdominal pain and abnormal CT scan    ASSESSMENT / PLAN:    1. 73 yo female with severe PVD with nausea, 30-40 pound weight loss since February and diffuse lower abdominal pain. Recent finding of left sided colitis on imaging ( ? Ischemic). She is being considered for revascularization of but vascular surgery requesting GI evaluation beforehand as symptoms atypical for intestinal ischemia.  -Patient will be scheduled for EGD and Flexible sigmoidoscopy with biopsies to evaluate above sx.The risks and benefits of procedures were discussed and the patient agrees to proceed.    2. New onset AFIB. Holding heparin gtt for 6 hours prior to endoscopic procedures.   3. Recent episode of acute pancreatitis, now with evolving pseudocyst on CTAP 07/09/18. Etiology? Ischemic?      HPI:    [name] is a 73 y.o. female with multiple medical problems not limited to HTN, PVD, AFIB, COPD with ongoing tobacco use, chronic low back pain.  She was admitted to [facility] hospital late February following a fall. She had RLE discoloration / coldness. 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 There was complete occlusion of right iliac limb of the bypass graft with reconstitution of flow to right CFA. Initially plan was to evaluate outpatient but Vascular felt this was acute on chronic ischemic, she was underwent RLE thromboembolectomy and RCF artery patch on 05/31/18. Started on Eliquis that admission.  Also during that admission CT angio revealed inflammatory changes of the tail of the pancreas consistent with acute pancreatitis.  There was a 3.2 x 3.2 cm low attenuating lesion/collection in the tail of the pancreas consistent with a pseudocyst. She had no  associated abdominal pain at that time the best she can remember.  WBC elevated during that admission, attributed to steroids given for COPD.   Patient taken back to [facility] ED July 07, 2018 after friend found her on the floor at home where she had apparently been for nearly 4 hours.  She was hypotensive , noted be in atrial fibrillation when EMS arrived. In ED. No acute findings on CT brain.  She was admitted for sepsis possibly secondary to pneumonia . Her white count was 27K.  She reported weight loss (30-40 pounds), nausea and abdominal pain for which GI was consulted ( Dr. Jena Gauss) .  RUQ ultrasound showed gallbladder sludge, no bile duct dilation or acute cholecystitis.  Plan was for EGD but this was postponed due to new onset atrial fibrillation requiring heparin as well as concern for mesenteric ischemia and / or complicated pancreatitis. In lieu of EGD, CT scan with pancreatic protocol was ordered with multiple abnormalities being found including colonic wall thickening suspicious for ischemia, interval evolution of the pancreatic tail pseudocyst which had become organized with an enhancing rim and evidence of extension into the perinephric space along with the anterior capsule of the upper left kidney.    Patient transferred to [facility] for revascularization of Celiac but Vascular wants GI workup before.  Her weight loss / nausea and abdominal pain are not really postprandial as occurs with celiac occlusion. The distribution of inflammation in colon on CT angio isn't suggestive of celiac but rather IMA and SMA occlusion.   Patient described diffuse lower abdominal pain, can't clarify how long ago it  started. Pain intermittent, food does make it worse (sometimes). Pain not relieved with BM. She has frequent nausea, no significant vomiting. No history of PUD. No NSAID use. She has never had a colonoscopy. No blood in stools   Past Medical History:  Diagnosis Date   Chronic back pain    GERD  (gastroesophageal reflux disease)    Hypercholesterolemia    Hyperlipidemia    Hypertension    Osteoarthritis of left knee    End-stage   Palpitations    PVD (peripheral vascular disease) (HCC)    Tobacco user     Past Surgical History:  Procedure Laterality Date   Angiogram-type unspecified  06/13/00   Dr. Hart [place]   FEMORAL-POPLITEAL BYPASS GRAFT     FINGER TENDON REPAIR  1988   Right mallet   KNEE ARTHROSCOPY  1996   Left   LUMBAR FUSION  1998   Dr. Threasa Heads ANGIOPLASTY Right 05/31/2018   Procedure: PATCH ANGIOPLASTY RIGHT FEMORAL ARTERY WITH DECRON PATCH;  Surgeon: Maeola Harman, MD;  Location: [facility] OR;  Service: Vascular;  Laterality: Right;   THROMBECTOMY FEMORAL ARTERY Right 05/31/2018   Procedure: RIGHT FEMORAL THROMBECTOMY;  Surgeon: Maeola Harman, MD;  Location: [facility] OR;  Service: Vascular;  Laterality: Right;    Prior to Admission medications   Medication Sig Start Date End Date Taking? Authorizing Provider  alum & mag hydroxide-simeth (MAALOX/MYLANTA) 200-200-20 MG/5ML suspension Take 15-30 mLs by mouth every 2 (two) hours as needed for indigestion. 06/08/18  Yes Delano Metz, MD  amLODipine (NORVASC) 10 MG tablet Take 1 tablet (10 mg total) by mouth daily. 06/08/18  Yes Delano Metz, MD  apixaban (ELIQUIS) 5 MG TABS tablet Take 1 tablet (5 mg total) by mouth 2 (two) times daily. 06/08/18  Yes Delano Metz, MD  atorvastatin (LIPITOR) 40 MG tablet Take 1 tablet (40 mg total) by mouth daily. 10/23/17  Yes Martin, Mary-Margaret, [name]  DULoxetine (CYMBALTA) 60 MG capsule Take 1 capsule (60 mg total) by mouth daily. 10/23/17  Yes Martin, Mary-Margaret, [name]  fenofibrate 160 MG tablet TAKE 1 TABLET BY MOUTH ONCE DAILY Patient taking differently: Take 160 mg by mouth daily.  11/25/17  Yes Martin, Mary-Margaret, [name]  lidocaine (LIDODERM) 5 % Place 1 patch onto the skin daily. Remove & Discard patch within 12 hours or as directed by MD 06/08/18  Yes  Delano Metz, MD  metoprolol tartrate (LOPRESSOR) 25 MG tablet Take 0.5 tablets (12.5 mg total) by mouth 2 (two) times daily. Patient taking differently: Take 12.5 mg by mouth 3 (three) times daily.  06/08/18  Yes Delano Metz, MD  nicotine (NICODERM CQ - DOSED IN MG/24 HOURS) 21 mg/24hr patch Place 1 patch (21 mg total) onto the skin daily. 06/08/18  Yes Delano Metz, MD  ondansetron (ZOFRAN) 4 MG tablet Take 4 mg by mouth every 4 (four) hours as needed for nausea or vomiting.   Yes [provider]  potassium chloride (K-DUR) 10 MEQ tablet Take 10 mEq by mouth daily.   Yes [provider]  pregabalin (LYRICA) 200 MG capsule Take 1 capsule by mouth 3 (three) times daily. 05/20/18  Yes [provider]  senna-docusate (SENOKOT-S) 8.6-50 MG tablet Take 2 tablets by mouth at bedtime as needed for mild constipation or moderate constipation. Patient taking differently: Take 1 tablet by mouth daily.  06/08/18  Yes Delano Metz, MD  tiZANidine (ZANAFLEX) 4 MG tablet Take 1-2 tablets (4-8 mg total) by mouth every 8 (eight) hours as  needed for muscle spasms. 09/02/16  Yes Daphine Deutscher, Mary-Margaret, [name]    Current Facility-Administered Medications  Medication Dose Route Frequency Provider Last Rate Last Dose   0.9 %  sodium chloride infusion   Intravenous PRN Maurilio Lovely D, DO 10 mL/hr at 07/09/18 0639 100 mL at 07/09/18 1191   acetaminophen (TYLENOL) tablet 650 mg  650 mg Oral Q6H PRN Pearson Grippe, MD   650 mg at 07/13/18 1158   Or   acetaminophen (TYLENOL) suppository 650 mg  650 mg Rectal Q6H PRN Pearson Grippe, MD       alum & mag hydroxide-simeth (MAALOX/MYLANTA) 200-200-20 MG/5ML suspension 15-30 mL  15-30 mL Oral Q2H PRN Pearson Grippe, MD       atorvastatin (LIPITOR) tablet 40 mg  40 mg Oral Daily Pearson Grippe, MD   40 mg at 07/13/18 0959   ceFEPIme (MAXIPIME) 2 g in sodium chloride 0.9 % 100 mL IVPB  2 g Intravenous Q8H Vanetta Mulders, MD 200 mL/hr at 07/13/18 0522 2 g at  07/13/18 0522   Chlorhexidine Gluconate Cloth 2 % PADS 6 each  6 each Topical Q0600 Maurilio Lovely D, DO   6 each at 07/13/18 4782   feeding supplement (PRO-STAT SUGAR FREE 64) liquid 30 mL  30 mL Oral BID Pearson Grippe, MD   30 mL at 07/09/18 0934   folic acid (FOLVITE) tablet 1 mg  1 mg Oral Daily Rolly Salter, MD   1 mg at 07/13/18 1000   heparin ADULT infusion 100 units/mL (25000 units/275mL sodium chloride 0.45%)  1,450 Units/hr Intravenous Continuous Coffee, Garrett, RPH 14.5 mL/hr at 07/13/18 0758 1,450 Units/hr at 07/13/18 0758   hydrALAZINE (APRESOLINE) injection 10 mg  10 mg Intravenous Q4H PRN Sherryll Burger, Pratik D, DO       ipratropium-albuterol (DUONEB) 0.5-2.5 (3) MG/3ML nebulizer solution 3 mL  3 mL Nebulization Q6H PRN Sherryll Burger, Pratik D, DO       labetalol (NORMODYNE,TRANDATE) injection 5 mg  5 mg Intravenous Q2H PRN Jerald Kief, MD       lidocaine (LIDODERM) 5 % 1 patch  1 patch Transdermal Q24H Pearson Grippe, MD   1 patch at 07/13/18 0015   LORazepam (ATIVAN) injection 0-4 mg  0-4 mg Intravenous Q6H Rolly Salter, MD   1 mg at 07/11/18 1853   Followed by   LORazepam (ATIVAN) injection 0-4 mg  0-4 mg Intravenous Q12H Rolly Salter, MD       LORazepam (ATIVAN) tablet 1 mg  1 mg Oral Q6H PRN Rolly Salter, MD       Or   LORazepam (ATIVAN) injection 1 mg  1 mg Intravenous Q6H PRN Rolly Salter, MD       metoprolol tartrate (LOPRESSOR) tablet 25 mg  25 mg Oral BID Jerald Kief, MD   25 mg at 07/13/18 1000   multivitamin with minerals tablet 1 tablet  1 tablet Oral Daily Rolly Salter, MD   1 tablet at 07/13/18 1000   nicotine (NICODERM CQ - dosed in mg/24 hours) patch 21 mg  21 mg Transdermal Daily Pearson Grippe, MD   21 mg at 07/13/18 1002   ondansetron (ZOFRAN) tablet 4 mg  4 mg Oral Q4H PRN Pearson Grippe, MD   4 mg at 07/10/18 1028   pantoprazole (PROTONIX) EC tablet 40 mg  40 mg Oral Q1200 Tiffany Kocher, PA-C   40 mg at 07/13/18 0959   potassium chloride SA  (K-DUR,KLOR-CON) CR tablet 40 mEq  40 mEq Oral Daily Maurilio LovelyShah, Pratik D, DO   40 mEq at 07/13/18 [id number]   pregabalin (LYRICA) capsule 200 mg  200 mg Oral TID Rolly SalterPatel, Pranav M, MD   200 mg at 07/13/18 1000   senna-docusate (Senokot-S) tablet 2 tablet  2 tablet Oral QHS PRN Pearson GrippeKim, James, MD       thiamine (VITAMIN B-1) tablet 100 mg  100 mg Oral Daily Lynden OxfordPatel, Pranav M, MD   100 mg at 07/13/18 1000   Or   thiamine (B-1) injection 100 mg  100 mg Intravenous Daily Rolly SalterPatel, Pranav M, MD       tiZANidine (ZANAFLEX) tablet 2 mg  2 mg Oral Q8H PRN Rolly SalterPatel, Pranav M, MD        Allergies as of 07/07/2018   (No Known Allergies)    Family History  Problem Relation Age of Onset   Heart failure Father        CHF   Hypertension Father    Heart disease Father    Hypertension Paternal Aunt    Hypertension Paternal Uncle    Hypertension Paternal Grandmother    Hypertension Paternal Grandfather    Coronary artery disease Neg Hx        Premature    Social History   Socioeconomic History   Marital status: Divorced    Spouse name: Not on file   Number of children: Not on file   Years of education: Not on file   Highest education level: Not on file  Occupational History   Occupation: Charity fundraiserWaiter    Employer: [employer]  Social Needs   Physicist, medicalinancial resource strain: Not on file   Food insecurity:    Worry: Not on file    Inability: Not on file   Transportation needs:    Medical: Not on file    Non-medical: Not on file  Tobacco Use   Smoking status: Current Every Day Smoker    Packs/day: 1.00    Years: 52.00    Pack years: 52.00    Types: Cigarettes   Smokeless tobacco: Never Used  Substance and Sexual Activity   Alcohol use: No    Comment: Occasionally   Drug use: No   Sexual activity: Not Currently  Lifestyle   Physical activity:    Days per week: Not on file    Minutes per session: Not on file   Stress: Not on file  Relationships   Social connections:    Talks  on phone: Not on file    Gets together: Not on file    Attends religious service: Not on file    Active member of club or organization: Not on file    Attends meetings of clubs or organizations: Not on file    Relationship status: Not on file   Intimate partner violence:    Fear of current or ex partner: Not on file    Emotionally abused: Not on file    Physically abused: Not on file    Forced sexual activity: Not on file  Other Topics Concern   Not on file  Social History Narrative   Not on file    Review of Systems: All systems reviewed and negative except where noted in HPI.  Physical Exam: Vital signs in last 24 hours: Temp:  [98 F (36.7 C)-98.1 F (36.7 C)] 98.1 F (36.7 C) (04/13 1201) Pulse Rate:  [93-109] 93 (04/13 1201) Resp:  [14-20] 14 (04/13 1201) BP: (132-158)/(63-91) 137/73 (04/13 1201) SpO2:  [90 %] 90 % (  04/12 2145) Weight:  [66.3 kg] 66.3 kg (04/13 0519) Last BM Date: 07/13/18 General:   Alert, well-developed female in NAD Psych:  Pleasant, cooperative. Normal mood and affect. Eyes:  Pupils equal, sclera clear, no icterus.   Conjunctiva pink. Ears:  Normal auditory acuity. Nose:  No deformity, discharge,  or lesions. Neck:  Supple; no masses Lungs:  Clear throughout to auscultation.   No wheezes, crackles, or rhonchi.  Heart:  Regular rate and rhythm, no lower extremity edema Abdomen:  Soft, non-distended, diffuse lower tenderness.  BS active, no palp mass    Rectal:  Deferred  Msk:  Symmetrical without gross deformities. . Neurologic:  Alert and  oriented x4;  grossly normal neurologically. Skin:  Intact without significant lesions or rashes.   Intake/Output from previous day: 04/12 0701 - 04/13 0700 In: 591.5 [I.V.:391.5; IV Piggyback:200] Out: -  Intake/Output this shift: No intake/output data recorded.  Lab Results: Recent Labs    07/11/18 1949 07/12/18 0356 07/13/18 0401  WBC 29.1* 23.7* 19.8*  HGB 9.4* 8.8* 10.2*  HCT 30.0*  29.4* 34.3*  PLT 438* 426* 438*   BMET Recent Labs    07/11/18 0446 07/11/18 1949 07/12/18 0356  NA 132* 130* 133*  K 2.9* 5.0 4.5  CL 98 98 99  CO2 23 21* 20*  GLUCOSE 89 116* 87  BUN 7* 7* 8  CREATININE 0.48 0.62 0.72  CALCIUM 7.8* 8.1* 8.3*   LFT Recent Labs    07/11/18 1949  PROT 5.3*  ALBUMIN 1.6*  AST 36  ALT 14  ALKPHOS 119  BILITOT 0.8   PT/INR Recent Labs    07/10/18 1410  LABPROT 16.1*  INR 1.3*   Hepatitis Panel No results for input(s): HEPBSAG, HCVAB, HEPAIGM, HEPBIGM in the last 72 hours.    Studies/Results: No results found.   Willette Cluster, NP-C @  07/13/2018, 12:26 PM

## 2018-07-13 NOTE — Progress Notes (Signed)
Patient's daughter called to get an update.

## 2018-07-13 NOTE — H&P (View-Only) (Signed)
° °Referring Provider: [facility]  °Primary Care Physician:  [name], [name], [name] °Primary Gastroenterologist: unassigned     °Reason for Consultation:  Nausea, weight loss, abdominal pain and abnormal CT scan  ° ° °ASSESSMENT / PLAN:   ° °1. 73 yo female with severe PVD with nausea, 30-40 pound weight loss since February and diffuse lower abdominal pain. Recent finding of left sided colitis on imaging ( ? Ischemic). She is being considered for revascularization of but vascular surgery requesting GI evaluation beforehand as symptoms atypical for intestinal ischemia.  °-Patient will be scheduled for EGD and Flexible sigmoidoscopy with biopsies to evaluate above sx.The risks and benefits of procedures were discussed and the patient agrees to proceed.  ° ° °2. New onset AFIB. Holding heparin gtt for 6 hours prior to endoscopic procedures.  ° °3. Recent episode of acute pancreatitis, now with evolving pseudocyst on CTAP 07/09/18. Etiology? Ischemic?   ° ° ° °HPI:  ° ° °[name] is a 73 y.o. female with multiple medical problems not limited to HTN, PVD, AFIB, COPD with ongoing tobacco use, chronic low back pain.  She was admitted to [facility] late February following a fall. She had RLE discoloration / coldness. 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 There was complete occlusion of right iliac limb of the bypass graft with reconstitution of flow to right CFA. Initially plan was to evaluate outpatient but Vascular felt this was acute on chronic ischemic, she was underwent RLE thromboembolectomy and RCF artery patch on 05/31/18. Started on Eliquis that admission.  Also during that admission CT angio revealed inflammatory changes of the tail of the pancreas consistent with acute pancreatitis.  There was a 3.2 x 3.2 cm low attenuating lesion/collection in the tail of the pancreas consistent with a pseudocyst. She had no  associated abdominal pain at that time the best she can remember.  WBC elevated during that admission, attributed to steroids given for COPD.  ° °Patient taken back to [place] ED July 07, 2018 after friend found her on the floor at home where she had apparently been for nearly 4 hours.  She was hypotensive , noted be in atrial fibrillation when EMS arrived. In ED. No acute findings on CT brain.  She was admitted for sepsis possibly secondary to pneumonia . Her white count was 27K.  She reported weight loss (30-40 pounds), nausea and abdominal pain for which GI was consulted ( Dr. Rourk) .  RUQ ultrasound showed gallbladder sludge, no bile duct dilation or acute cholecystitis.  Plan was for EGD but this was postponed due to new onset atrial fibrillation requiring heparin as well as concern for mesenteric ischemia and / or complicated pancreatitis. In lieu of EGD, CT scan with pancreatic protocol was ordered with multiple abnormalities being found including colonic wall thickening suspicious for ischemia, interval evolution of the pancreatic tail pseudocyst which had become organized with an enhancing rim and evidence of extension into the perinephric space along with the anterior capsule of the upper left kidney.   ° °Patient transferred to [facility] for revascularization of Celiac but Vascular wants GI workup before.  Her weight loss / nausea and abdominal pain are not really postprandial as occurs with celiac occlusion. The distribution of inflammation in colon on CT angio isn't suggestive of celiac but rather IMA and SMA occlusion.  ° °Patient described diffuse lower abdominal pain, can't clarify how long ago it   started. Pain intermittent, food does make it worse (sometimes). Pain not relieved with BM. She has frequent nausea, no significant vomiting. No history of PUD. No NSAID use. She has never had a colonoscopy. No blood in stools ° ° °Past Medical History:  °Diagnosis Date  °• Chronic back pain   °• GERD  (gastroesophageal reflux disease)   °• Hypercholesterolemia   °• Hyperlipidemia   °• Hypertension   °• Osteoarthritis of left knee   ° End-stage  °• Palpitations   °• PVD (peripheral vascular disease) (HCC)   °• Tobacco user   ° ° °Past Surgical History:  °Procedure Laterality Date  °• Angiogram-type unspecified  06/13/00  ° Dr. Lawson  °• FEMORAL-POPLITEAL BYPASS GRAFT    °• FINGER TENDON REPAIR  1988  ° Right mallet  °• KNEE ARTHROSCOPY  1996  ° Left  °• LUMBAR FUSION  1998  ° Dr. Long  °• PATCH ANGIOPLASTY Right 05/31/2018  ° Procedure: PATCH ANGIOPLASTY RIGHT FEMORAL ARTERY WITH DECRON PATCH;  Surgeon: Cain, Brandon Christopher, MD;  Location: [facility] OR;  Service: Vascular;  Laterality: Right;  °• THROMBECTOMY FEMORAL ARTERY Right 05/31/2018  ° Procedure: RIGHT FEMORAL THROMBECTOMY;  Surgeon: Cain, Brandon Christopher, MD;  Location: [facility] OR;  Service: Vascular;  Laterality: Right;  ° ° °Prior to Admission medications   °Medication Sig Start Date End Date Taking? Authorizing Provider  °alum & mag hydroxide-simeth (MAALOX/MYLANTA) 200-200-20 MG/5ML suspension Take 15-30 mLs by mouth every 2 (two) hours as needed for indigestion. 06/08/18  Yes Schertz, Robert, MD  °amLODipine (NORVASC) 10 MG tablet Take 1 tablet (10 mg total) by mouth daily. 06/08/18  Yes Schertz, Robert, MD  °apixaban (ELIQUIS) 5 MG TABS tablet Take 1 tablet (5 mg total) by mouth 2 (two) times daily. 06/08/18  Yes Schertz, Robert, MD  °atorvastatin (LIPITOR) 40 MG tablet Take 1 tablet (40 mg total) by mouth daily. 10/23/17  Yes Martin, Mary-Margaret, [name]  °DULoxetine (CYMBALTA) 60 MG capsule Take 1 capsule (60 mg total) by mouth daily. 10/23/17  Yes Martin, Mary-Margaret, [name]  °fenofibrate 160 MG tablet TAKE 1 TABLET BY MOUTH ONCE DAILY °Patient taking differently: Take 160 mg by mouth daily.  11/25/17  Yes Martin, Mary-Margaret, [name]  °lidocaine (LIDODERM) 5 % Place 1 patch onto the skin daily. Remove & Discard patch within 12 hours or as directed by MD 06/08/18  Yes  Schertz, Robert, MD  °metoprolol tartrate (LOPRESSOR) 25 MG tablet Take 0.5 tablets (12.5 mg total) by mouth 2 (two) times daily. °Patient taking differently: Take 12.5 mg by mouth 3 (three) times daily.  06/08/18  Yes Schertz, Robert, MD  °nicotine (NICODERM CQ - DOSED IN MG/24 HOURS) 21 mg/24hr patch Place 1 patch (21 mg total) onto the skin daily. 06/08/18  Yes Schertz, Robert, MD  °ondansetron (ZOFRAN) 4 MG tablet Take 4 mg by mouth every 4 (four) hours as needed for nausea or vomiting.   Yes [provider]  °potassium chloride (K-DUR) 10 MEQ tablet Take 10 mEq by mouth daily.   Yes [provider]  °pregabalin (LYRICA) 200 MG capsule Take 1 capsule by mouth 3 (three) times daily. 05/20/18  Yes [provider]  °senna-docusate (SENOKOT-S) 8.6-50 MG tablet Take 2 tablets by mouth at bedtime as needed for mild constipation or moderate constipation. °Patient taking differently: Take 1 tablet by mouth daily.  06/08/18  Yes Schertz, Robert, MD  °tiZANidine (ZANAFLEX) 4 MG tablet Take 1-2 tablets (4-8 mg total) by mouth every 8 (eight) hours as   needed for muscle spasms. 09/02/16  Yes Martin, Mary-Margaret, [name]  ° ° °Current Facility-Administered Medications  °Medication Dose Route Frequency Provider Last Rate Last Dose  °• 0.9 %  sodium chloride infusion   Intravenous PRN Shah, Pratik D, DO 10 mL/hr at 07/09/18 0639 100 mL at 07/09/18 0639  °• acetaminophen (TYLENOL) tablet 650 mg  650 mg Oral Q6H PRN Kim, James, MD   650 mg at 07/13/18 1158  ° Or  °• acetaminophen (TYLENOL) suppository 650 mg  650 mg Rectal Q6H PRN Kim, James, MD      °• alum & mag hydroxide-simeth (MAALOX/MYLANTA) 200-200-20 MG/5ML suspension 15-30 mL  15-30 mL Oral Q2H PRN Kim, James, MD      °• atorvastatin (LIPITOR) tablet 40 mg  40 mg Oral Daily Kim, James, MD   40 mg at 07/13/18 0959  °• ceFEPIme (MAXIPIME) 2 g in sodium chloride 0.9 % 100 mL IVPB  2 g Intravenous Q8H Zackowski, Scott, MD 200 mL/hr at 07/13/18 0522 2 g at  07/13/18 0522  °• Chlorhexidine Gluconate Cloth 2 % PADS 6 each  6 each Topical Q0600 Shah, Pratik D, DO   6 each at 07/13/18 0605  °• feeding supplement (PRO-STAT SUGAR FREE 64) liquid 30 mL  30 mL Oral BID Kim, James, MD   30 mL at 07/09/18 0934  °• folic acid (FOLVITE) tablet 1 mg  1 mg Oral Daily Patel, Pranav M, MD   1 mg at 07/13/18 1000  °• heparin ADULT infusion 100 units/mL (25000 units/250mL sodium chloride 0.45%)  1,450 Units/hr Intravenous Continuous Coffee, Garrett, RPH 14.5 mL/hr at 07/13/18 0758 1,450 Units/hr at 07/13/18 0758  °• hydrALAZINE (APRESOLINE) injection 10 mg  10 mg Intravenous Q4H PRN Shah, Pratik D, DO      °• ipratropium-albuterol (DUONEB) 0.5-2.5 (3) MG/3ML nebulizer solution 3 mL  3 mL Nebulization Q6H PRN Shah, Pratik D, DO      °• labetalol (NORMODYNE,TRANDATE) injection 5 mg  5 mg Intravenous Q2H PRN Chiu, Stephen K, MD      °• lidocaine (LIDODERM) 5 % 1 patch  1 patch Transdermal Q24H Kim, James, MD   1 patch at 07/13/18 0015  °• LORazepam (ATIVAN) injection 0-4 mg  0-4 mg Intravenous Q6H Patel, Pranav M, MD   1 mg at 07/11/18 1853  ° Followed by  °• LORazepam (ATIVAN) injection 0-4 mg  0-4 mg Intravenous Q12H Patel, Pranav M, MD      °• LORazepam (ATIVAN) tablet 1 mg  1 mg Oral Q6H PRN Patel, Pranav M, MD      ° Or  °• LORazepam (ATIVAN) injection 1 mg  1 mg Intravenous Q6H PRN Patel, Pranav M, MD      °• metoprolol tartrate (LOPRESSOR) tablet 25 mg  25 mg Oral BID Chiu, Stephen K, MD   25 mg at 07/13/18 1000  °• multivitamin with minerals tablet 1 tablet  1 tablet Oral Daily Patel, Pranav M, MD   1 tablet at 07/13/18 1000  °• nicotine (NICODERM CQ - dosed in mg/24 hours) patch 21 mg  21 mg Transdermal Daily Kim, James, MD   21 mg at 07/13/18 1002  °• ondansetron (ZOFRAN) tablet 4 mg  4 mg Oral Q4H PRN Kim, James, MD   4 mg at 07/10/18 1028  °• pantoprazole (PROTONIX) EC tablet 40 mg  40 mg Oral Q1200 Lewis, Leslie S, PA-C   40 mg at 07/13/18 0959  °• potassium chloride SA  (K-DUR,KLOR-CON) CR tablet 40 mEq    40 mEq Oral Daily Shah, Pratik D, DO   40 mEq at 07/13/18 0959  °• pregabalin (LYRICA) capsule 200 mg  200 mg Oral TID Patel, Pranav M, MD   200 mg at 07/13/18 1000  °• senna-docusate (Senokot-S) tablet 2 tablet  2 tablet Oral QHS PRN Kim, James, MD      °• thiamine (VITAMIN B-1) tablet 100 mg  100 mg Oral Daily Patel, Pranav M, MD   100 mg at 07/13/18 1000  ° Or  °• thiamine (B-1) injection 100 mg  100 mg Intravenous Daily Patel, Pranav M, MD      °• tiZANidine (ZANAFLEX) tablet 2 mg  2 mg Oral Q8H PRN Patel, Pranav M, MD      ° ° °Allergies as of 07/07/2018  °• (No Known Allergies)  ° ° °Family History  °Problem Relation Age of Onset  °• Heart failure Father   °     CHF  °• Hypertension Father   °• Heart disease Father   °• Hypertension Paternal Aunt   °• Hypertension Paternal Uncle   °• Hypertension Paternal Grandmother   °• Hypertension Paternal Grandfather   °• Coronary artery disease Neg Hx   °     Premature  ° ° °Social History  ° °Socioeconomic History  °• Marital status: Divorced  °  Spouse name: Not on file  °• Number of children: Not on file  °• Years of education: Not on file  °• Highest education level: Not on file  °Occupational History  °• Occupation: Waiter  °  Employer: [employer]  °Social Needs  °• Financial resource strain: Not on file  °• Food insecurity:  °  Worry: Not on file  °  Inability: Not on file  °• Transportation needs:  °  Medical: Not on file  °  Non-medical: Not on file  °Tobacco Use  °• Smoking status: Current Every Day Smoker  °  Packs/day: 1.00  °  Years: 52.00  °  Pack years: 52.00  °  Types: Cigarettes  °• Smokeless tobacco: Never Used  °Substance and Sexual Activity  °• Alcohol use: No  °  Comment: Occasionally  °• Drug use: No  °• Sexual activity: Not Currently  °Lifestyle  °• Physical activity:  °  Days per week: Not on file  °  Minutes per session: Not on file  °• Stress: Not on file  °Relationships  °• Social connections:  °  Talks  on phone: Not on file  °  Gets together: Not on file  °  Attends religious service: Not on file  °  Active member of club or organization: Not on file  °  Attends meetings of clubs or organizations: Not on file  °  Relationship status: Not on file  °• Intimate partner violence:  °  Fear of current or ex partner: Not on file  °  Emotionally abused: Not on file  °  Physically abused: Not on file  °  Forced sexual activity: Not on file  °Other Topics Concern  °• Not on file  °Social History Narrative  °• Not on file  ° ° °Review of Systems: °All systems reviewed and negative except where noted in HPI. ° °Physical Exam: °Vital signs in last 24 hours: °Temp:  [98 °F (36.7 °C)-98.1 °F (36.7 °C)] 98.1 °F (36.7 °C) (04/13 1201) °Pulse Rate:  [93-109] 93 (04/13 1201) °Resp:  [14-20] 14 (04/13 1201) °BP: (132-158)/(63-91) 137/73 (04/13 1201) °SpO2:  [90 %] 90 % (  04/12 2145) °Weight:  [66.3 kg] 66.3 kg (04/13 0519) °Last BM Date: 07/13/18 °General:   Alert, well-developed female in NAD °Psych:  Pleasant, cooperative. Normal mood and affect. °Eyes:  Pupils equal, sclera clear, no icterus.   Conjunctiva pink. °Ears:  Normal auditory acuity. °Nose:  No deformity, discharge,  or lesions. °Neck:  Supple; no masses °Lungs:  Clear throughout to auscultation.   No wheezes, crackles, or rhonchi.  °Heart:  Regular rate and rhythm, no lower extremity edema °Abdomen:  Soft, non-distended, diffuse lower tenderness.  BS active, no palp mass    °Rectal:  Deferred  °Msk:  Symmetrical without gross deformities. . °Neurologic:  Alert and  oriented x4;  grossly normal neurologically. °Skin:  Intact without significant lesions or rashes. ° ° °Intake/Output from previous day: °04/12 0701 - 04/13 0700 °In: 591.5 [I.V.:391.5; IV Piggyback:200] °Out: -  °Intake/Output this shift: °No intake/output data recorded. ° °Lab Results: °Recent Labs  °  07/11/18 °1949 07/12/18 °0356 07/13/18 °0401  °WBC 29.1* 23.7* 19.8*  °HGB 9.4* 8.8* 10.2*  °HCT 30.0*  29.4* 34.3*  °PLT 438* 426* 438*  ° °BMET °Recent Labs  °  07/11/18 °0446 07/11/18 °1947 07/12/18 °0356  °NA 132* 130* 133*  °K 2.9* 5.0 4.5  °CL 98 98 99  °CO2 23 21* 20*  °GLUCOSE 89 116* 87  °BUN 7* 7* 8  °CREATININE 0.48 0.62 0.72  °CALCIUM 7.8* 8.1* 8.3*  ° °LFT °Recent Labs  °  07/11/18 °1949  °PROT 5.3*  °ALBUMIN 1.6*  °AST 36  °ALT 14  °ALKPHOS 119  °BILITOT 0.8  ° °PT/INR °Recent Labs  °  07/10/18 °1410  °LABPROT 16.1*  °INR 1.3*  ° °Hepatitis Panel °No results for input(s): HEPBSAG, HCVAB, HEPAIGM, HEPBIGM in the last 72 hours. ° ° ° °Studies/Results: °No results found. ° ° °Loree Shehata, NP-C @  07/13/2018, 12:26 PM ° ° ° ° °

## 2018-07-14 ENCOUNTER — Inpatient Hospital Stay (HOSPITAL_COMMUNITY): Payer: Medicare Other | Admitting: Anesthesiology

## 2018-07-14 ENCOUNTER — Encounter (HOSPITAL_COMMUNITY): Payer: Self-pay

## 2018-07-14 ENCOUNTER — Encounter (HOSPITAL_COMMUNITY)
Admission: EM | Disposition: A | Discharge status: Hospice | Payer: Self-pay | Source: Home / Self Care | Attending: Internal Medicine

## 2018-07-14 DIAGNOSIS — K2970 Gastritis, unspecified, without bleeding: Secondary | ICD-10-CM

## 2018-07-14 DIAGNOSIS — R1030 Lower abdominal pain, unspecified: Secondary | ICD-10-CM

## 2018-07-14 DIAGNOSIS — R933 Abnormal findings on diagnostic imaging of other parts of digestive tract: Secondary | ICD-10-CM

## 2018-07-14 DIAGNOSIS — K621 Rectal polyp: Secondary | ICD-10-CM

## 2018-07-14 HISTORY — PX: ESOPHAGOGASTRODUODENOSCOPY: SHX5428

## 2018-07-14 HISTORY — PX: FLEXIBLE SIGMOIDOSCOPY: SHX5431

## 2018-07-14 HISTORY — PX: BIOPSY: SHX5522

## 2018-07-14 LAB — HEPARIN LEVEL (UNFRACTIONATED): Heparin Unfractionated: 0.44 IU/mL (ref 0.30–0.70)

## 2018-07-14 LAB — BASIC METABOLIC PANEL
Anion gap: 10 (ref 5–15)
BUN: 5 mg/dL — ABNORMAL LOW (ref 8–23)
CO2: 23 mmol/L (ref 22–32)
Calcium: 8.1 mg/dL — ABNORMAL LOW (ref 8.9–10.3)
Chloride: 102 mmol/L (ref 98–111)
Creatinine, Ser: 0.58 mg/dL (ref 0.44–1.00)
GFR calc Af Amer: >60 mL/min (ref >60)
GFR calc non Af Amer: >60 mL/min (ref >60)
Glucose, Bld: 79 mg/dL (ref 70–99)
Potassium: 3.5 mmol/L (ref 3.5–5.1)
Sodium: 135 mmol/L (ref 135–145)

## 2018-07-14 LAB — CBC
HCT: 32.9 % — ABNORMAL LOW (ref 36.0–46.0)
Hemoglobin: 9.9 g/dL — ABNORMAL LOW (ref 12.0–15.0)
MCH: 24.6 pg — ABNORMAL LOW (ref 26.0–34.0)
MCHC: 30.1 g/dL (ref 30.0–36.0)
MCV: 81.8 fL (ref 80.0–100.0)
Platelets: 476 10*3/uL — ABNORMAL HIGH (ref 150–400)
RBC: 4.02 MIL/uL (ref 3.87–5.11)
RDW: 18.2 % — ABNORMAL HIGH (ref 11.5–15.5)
WBC: 21.1 10*3/uL — ABNORMAL HIGH (ref 4.0–10.5)
nRBC: 0.3 % — ABNORMAL HIGH (ref 0.0–0.2)

## 2018-07-14 SURGERY — EGD (ESOPHAGOGASTRODUODENOSCOPY)
Anesthesia: General

## 2018-07-14 MED ORDER — PROPOFOL 10 MG/ML IV BOLUS
INTRAVENOUS | Status: DC | PRN
  Administered 2018-07-14: 70 mg via INTRAVENOUS

## 2018-07-14 MED ORDER — LACTATED RINGERS IV SOLN
INTRAVENOUS | Status: DC | PRN
  Administered 2018-07-14: 15:00:00 via INTRAVENOUS

## 2018-07-14 MED ORDER — METOPROLOL TARTRATE 50 MG PO TABS
50.0000 mg | ORAL_TABLET | Freq: Two times a day (BID) | ORAL | Status: DC
  Administered 2018-07-14: 50 mg via ORAL
  Filled 2018-07-14: qty 1

## 2018-07-14 MED ORDER — PANTOPRAZOLE SODIUM 40 MG PO TBEC
40.0000 mg | DELAYED_RELEASE_TABLET | Freq: Two times a day (BID) | ORAL | Status: DC
  Administered 2018-07-14 – 2018-07-16 (×4): 40 mg via ORAL
  Filled 2018-07-14 (×4): qty 1

## 2018-07-14 MED ORDER — HEPARIN (PORCINE) 25000 UT/250ML-% IV SOLN
1450.0000 [IU]/h | INTRAVENOUS | Status: AC
  Administered 2018-07-14 – 2018-07-15 (×3): 1450 [IU]/h via INTRAVENOUS
  Filled 2018-07-14 (×2): qty 250

## 2018-07-14 MED ORDER — LIDOCAINE 2% (20 MG/ML) 5 ML SYRINGE
INTRAMUSCULAR | Status: DC | PRN
  Administered 2018-07-14: 100 mg via INTRAVENOUS

## 2018-07-14 MED ORDER — SUCCINYLCHOLINE CHLORIDE 200 MG/10ML IV SOSY
PREFILLED_SYRINGE | INTRAVENOUS | Status: DC | PRN
  Administered 2018-07-14: 100 mg via INTRAVENOUS

## 2018-07-14 MED ORDER — SODIUM CHLORIDE 0.9 % IV SOLN: INTRAVENOUS | Status: DC

## 2018-07-14 MED ORDER — DEXAMETHASONE SODIUM PHOSPHATE 10 MG/ML IJ SOLN
INTRAMUSCULAR | Status: DC | PRN
  Administered 2018-07-14: 10 mg via INTRAVENOUS

## 2018-07-14 MED ORDER — METOPROLOL TARTRATE 50 MG PO TABS
75.0000 mg | ORAL_TABLET | Freq: Two times a day (BID) | ORAL | Status: DC
  Administered 2018-07-14 – 2018-08-08 (×45): 75 mg via ORAL
  Filled 2018-07-14 (×45): qty 1

## 2018-07-14 MED ORDER — LACTATED RINGERS IV SOLN
INTRAVENOUS | Status: DC
  Administered 2018-07-14: 14:00:00 via INTRAVENOUS

## 2018-07-14 MED ORDER — ONDANSETRON HCL 4 MG/2ML IJ SOLN
4.0000 mg | Freq: Four times a day (QID) | INTRAMUSCULAR | Status: DC | PRN
  Administered 2018-07-14 – 2018-07-18 (×3): 4 mg via INTRAVENOUS
  Filled 2018-07-14 (×4): qty 2

## 2018-07-14 MED ORDER — ONDANSETRON HCL 4 MG/2ML IJ SOLN
INTRAMUSCULAR | Status: DC | PRN
  Administered 2018-07-14: 4 mg via INTRAVENOUS

## 2018-07-14 NOTE — Progress Notes (Signed)
    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  If unremarkable, will plan for angiogram this week  Wells Brabham 07/14/2018 10:12 AM --  Vitals:   07/14/18 0416 07/14/18 0730  BP: (!) 147/80   Pulse: 87   Resp: 19 (!) 23  Temp: 98.2 F (36.8 C)   SpO2: 93%     Intake/Output Summary (Last 24 hours) at 07/14/2018 1012 Last data filed at 07/14/2018 0800 Gross per 24 hour  Intake 1217.57 ml  Output 300 ml  Net 917.57 ml     Laboratory CBC    Component Value Date/Time   WBC 21.1 (H) 07/14/2018 0457   HGB 9.9 (L) 07/14/2018 0457   HGB 12.5 05/22/2018 1039   HCT 32.9 (L) 07/14/2018 0457   HCT 39.4 05/22/2018 1039   PLT 476 (H) 07/14/2018 0457   PLT 437 05/22/2018 1039    BMET    Component Value Date/Time   NA 135 07/14/2018 0457   NA 142 05/22/2018 1039   K 3.5 07/14/2018 0457   CL 102 07/14/2018 0457   CO2 23 07/14/2018 0457   GLUCOSE 79 07/14/2018 0457   BUN 5 (L) 07/14/2018 0457   BUN 10 05/22/2018 1039   CREATININE 0.58 07/14/2018 0457   CREATININE 0.72 08/07/2012 1016   CALCIUM 8.1 (L) 07/14/2018 0457   GFRNONAA >60 07/14/2018 0457   GFRNONAA 88 08/07/2012 1016   GFRAA >60 07/14/2018 0457   GFRAA >89 08/07/2012 1016    COAG Lab Results  Component Value Date   INR 1.3 (H) 07/10/2018   INR 1.7 (H) 07/07/2018   INR 1.05 05/17/2009   No results found for: PTT  Antibiotics Anti-infectives (From admission, onward)   Start     Dose/Rate Route Frequency Ordered Stop   07/08/18 2300  vancomycin (VANCOCIN) 1,500 mg in sodium chloride 0.9 % 500 mL IVPB  Status:  Discontinued     1,500 mg 250 mL/hr over 120 Minutes Intravenous  Once 07/07/18 2128 07/07/18 2129   07/08/18 2300  vancomycin (VANCOCIN) 1,500 mg in sodium chloride 0.9 % 500 mL IVPB  Status:   Discontinued     1,500 mg 250 mL/hr over 120 Minutes Intravenous Every 24 hours 07/07/18 2129 07/08/18 0726   07/08/18 0600  ceFEPIme (MAXIPIME) 2 g in sodium chloride 0.9 % 100 mL IVPB  Status:  Discontinued     2 g 200 mL/hr over 30 Minutes Intravenous Every 8 hours 07/07/18 2124 07/13/18 1333   07/07/18 2130  vancomycin (VANCOCIN) 1,500 mg in sodium chloride 0.9 % 500 mL IVPB     1,500 mg 250 mL/hr over 120 Minutes Intravenous  Once 07/07/18 2123 07/08/18 0028   07/07/18 2115  vancomycin (VANCOCIN) IVPB 1000 mg/200 mL premix  Status:  Discontinued     1,000 mg 200 mL/hr over 60 Minutes Intravenous  Once 07/07/18 2110 07/07/18 2128   07/07/18 2115  ceFEPIme (MAXIPIME) 2 g in sodium chloride 0.9 % 100 mL IVPB     2 g 200 mL/hr over 30 Minutes Intravenous  Once 07/07/18 2110 07/07/18 2203       V. Charlena Cross, M.D., [facility] [place] Office: [phone] Pager:  [phone]

## 2018-07-14 NOTE — Anesthesia Preprocedure Evaluation (Addendum)
Anesthesia Evaluation  Patient identified by MRN, date of birth, ID band Patient awake    Reviewed: Allergy & Precautions, NPO status , Patient's Chart, lab work & pertinent test results  Airway Mallampati: II  TM Distance: >3 FB Neck ROM: Full    Dental no notable dental hx.    Pulmonary pneumonia, unresolved, COPD, Current Smoker,    Pulmonary exam normal breath sounds clear to auscultation       Cardiovascular hypertension, + Peripheral Vascular Disease  + dysrhythmias Atrial Fibrillation  Rhythm:Irregular Rate:Normal     Neuro/Psych negative neurological ROS  negative psych ROS   GI/Hepatic negative GI ROS, Neg liver ROS,   Endo/Other  negative endocrine ROS  Renal/GU negative Renal ROS  negative genitourinary   Musculoskeletal negative musculoskeletal ROS (+)   Abdominal   Peds negative pediatric ROS (+)  Hematology  (+) anemia ,   Anesthesia Other Findings   Reproductive/Obstetrics negative OB ROS                            Anesthesia Physical Anesthesia Plan  ASA: III and emergent  Anesthesia Plan: General   Post-op Pain Management:    Induction: Intravenous and Rapid sequence  PONV Risk Score and Plan: 2 and Ondansetron and Dexamethasone  Airway Management Planned: Oral ETT  Additional Equipment:   Intra-op Plan:   Post-operative Plan: Extubation in OR  Informed Consent: I have reviewed the patients History and Physical, chart, labs and discussed the procedure including the risks, benefits and alternatives for the proposed anesthesia with the patient or authorized representative who has indicated his/her understanding and acceptance.     Dental advisory given  Plan Discussed with: CRNA and Surgeon  Anesthesia Plan Comments:         Anesthesia Quick Evaluation

## 2018-07-14 NOTE — Op Note (Signed)
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 Rhea BeltonPyrtle, MD, Clearnce Sorrelarrie Baker, RN, Vicki MalletBrandy Grace,                            RN, Matthew FolksBrooke Cummings, Technician, Verita SchneidersPrasun Sharma,                            Technician Referring MD:             [facility],                           Janetta Horaharles E. Fields, MD Medicines:                General Anesthesia Complications:            No immediate complications. Estimated Blood Loss:     Estimated blood loss: none. Procedure:                Pre-Anesthesia Assessment:                           - Prior to the procedure, a History and Physical                            was performed, and patient medications and                            allergies were reviewed. The patient's tolerance of                            previous anesthesia was also reviewed. The risks                            and benefits of the procedure and the sedation                            options and risks were discussed with the patient.                            All questions were answered, and informed consent                            was obtained. Prior Anticoagulants: The patient has  taken heparin, last dose was day of procedure. ASA                            Grade Assessment: III - A patient with severe                            systemic disease. After reviewing the risks and                            benefits, the patient was deemed in satisfactory                            condition to undergo the procedure.                           After obtaining  informed consent, the scope was                            passed under direct vision. The PCF-H190DL                            ([id number]) Olympus pediatric colonoscope was                            introduced through the anus and advanced to the                            sigmoid colon. The GIF-H190 ([id number]) Olympus                            gastroscope was introduced through the and advanced                            to the. The flexible sigmoidoscopy was accomplished                            without difficulty. The patient tolerated the                            procedure well. The quality of the bowel                            preparation was poor. Scope In: Scope Out: Findings:      The digital rectal exam was normal.      A 7 mm polyp was found in the rectum. The polyp was sessile. Polypectomy       was not attempted due to inadequate bowel preparation.      Copious quantities of semi-solid stool was found in the rectum and in       the sigmoid colon, interfering with visualization.      Retroflexion in the rectum was not performed. Impression:               - Preparation of the colon was poor.                           -  One 7 mm polyp in the rectum. Resection not                            attempted.                           - Stool in the rectum and in the sigmoid colon.                           - Descending colon/splenic flexure not reached due                            to poor preparation. See below.                           - No specimens collected. Moderate Sedation:      N/A Recommendation:           - Return patient to hospital ward for ongoing care.                           - Vascular surgery following and planning                            intervention.                           - Area of probable ischemia in the descending colon                            and neck splenic flexure not able to be visualized                            today due to poor  prep. However given findings at                            EGD, known significant mesenteric vascular disease,                            the CT findings and presentation is consistent with                            ischemic colitis.                           - We will defer to vascular surgery regarding                            revascularization.                           - Eventual outpatient complete colonoscopy is                            recommended to follow-up ischemic colitis to ensure  healing and also for screening purposes. Procedure Code(s):        --- Professional ---                           [phone], Sigmoidoscopy, flexible; diagnostic,                            including collection of specimen(s) by brushing or                            washing, when performed (separate procedure) Diagnosis Code(s):        --- Professional ---                           K62.1, Rectal polyp                           R10.30, Lower abdominal pain, unspecified                           R93.3, Abnormal findings on diagnostic imaging of                            other parts of digestive tract CPT copyright 2019 American Medical Association. All rights reserved. The codes documented in this report are preliminary and upon coder review may  be revised to meet current compliance requirements. Beverley Fiedler, MD 07/14/2018 3:41:18 PM This report has been signed electronically. Number of Addenda: 0

## 2018-07-14 NOTE — Progress Notes (Addendum)
PROGRESS NOTE    [name]  AYO:[id number] DOB: Aug 22, 1945 DOA: 07/07/2018 PCP: Junie Spencer, [name]    Brief Narrative:  73 y.o. female, with a several year history of frequent nausea and vomiting after meals.  She states that in the past she was told to take rolaids or xanax.  She states the rolaids help the xanax didn't.  She denies food fear.  She has had 30lb wt loss over the last several months.  She states that two out of every 3 meals she will become nauseous and sometimes vomit.  She has also had some diarrhea recently. She does have afib and has been on chronic anticoagulation therapy. She was admitted to [facility] 5 days ago with somnolence and a diagnosis of sepsis. Albumin was 2.1.  She has had a prior aortobifem by Dr Hart Rochester in 2002 and had thrombectomy of the right limb of this by Dr Randie Heinz 3/20.  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 Her left internal iliac artery was patent at that point.  Repeat CT on 07/09/18 is unchanged but not dedicated arterial phase so difficult to know if internal iliac arteries are patent.    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  Other medical problems include    Assessment & Plan:   Principal Problem:   Sepsis (HCC) Active Problems:   Hyperlipidemia with target LDL less than 100   Essential hypertension   Peripheral vascular disease (HCC)   Severe protein-calorie malnutrition (HCC)   Abnormal liver function   Loss of weight   Nausea without vomiting  1. Sepsis secondary to questionable pneumonia-improving.. 1. Remains afebrile 2. WBC improved to 19.8k 3. Completed 7 days of cefepime 2. Poor oral intake due to intractable nausea and vomiting and abdominal pain with associated weight loss. 1.  Suspected secondary to mesenteric ischemia and pancreatic pseudocyst. 2. [facility] initially discussed with IR who initially planned stent/revascularization on 4/13 on transfer to [facility] 3. IR later realized patient is well known to Vascular Surgery service and Vascular Surgery consulted and evaluated 4. Vascular Surgery recommended EGD/GI work up before considering revascularization since location and degree of SMA stenosis did not correlate with patient's symptoms or presentation 5. On 4/12, I discussed case with Dr. Marina Goodell, GI on call at [facility] for possible EGD while here. Per GI, since pt was already seen and is known by GI at [facility] and since patient was already planned to return to [facility] later, recommendation was to return to [facility] for further GI work up by [facility] 6. Formally consulted GI. Plan for EGD and flex sig today 3. Severe hypokalemia-corrected. 1. Repeat bmet in AM 4. PVD with dry gangrene. 1. Continued on Eliquis on discharge. 2. Stable at this time 5. Dyslipidemia.  1. Continue on fenofibrate and atorvastatin. 2. Currently stable 6. COPD with tobacco abuse.  1. Duo nebs every6hours as needed and nicotine patch. Smoking cessation advised. 2. Remains stable 7. Essential hypertension.  1. Hypotensive recently with metoprolol and cardizem held. BP now improved. HR stable.  2. Tolerating 50mg  bid metoprolol, will increase to 75mg  bid per below 8. GERD.  1. Maalox as needed. 2. Started on PPI. 9. Abnormal LFTs-stable. 1. Right upper quadrant ultrasound with no  acute findings. 2. Recommend continued oupt w/u.  10. Fall at home with weakness.  1. PT recommendations for home health on discharge.  2. Stable at this time 11. Atrial fibrillation 1. likely new onset.  2. Maintain on heparin drip for now and 2D echocardiogramwith LVEF 50 to 55%.  3. Cardizem and beta blocker initially held given hypotension and bradycardia  4. Tolerating metoprolol  bid with  hr remaining in the low 100's. Will increase to  BID  DVT prophylaxis: Heparin gtt Code Status: Full Family Communication: Updated patient's daughter over phone Disposition Plan: Uncertain at this time  Consultants:   GI  Vascular Surgery  IR  [facility]  Procedures:     Antimicrobials: Anti-infectives (From admission, onward)   Start     Dose/Rate Route Frequency Ordered Stop   07/08/18 2300  vancomycin (VANCOCIN) 1,500 mg in sodium chloride 0.9 % 500 mL IVPB  Status:  Discontinued     1,500 mg 250 mL/hr over 120 Minutes Intravenous  Once 07/07/18 2128 07/07/18 2129   07/08/18 2300  vancomycin (VANCOCIN) 1,500 mg in sodium chloride 0.9 % 500 mL IVPB  Status:  Discontinued     1,500 mg 250 mL/hr over 120 Minutes Intravenous Every 24 hours 07/07/18 2129 07/08/18 0726   07/08/18 0600  ceFEPIme (MAXIPIME) 2 g in sodium chloride 0.9 % 100 mL IVPB  Status:  Discontinued     2 g 200 mL/hr over 30 Minutes Intravenous Every 8 hours 07/07/18 2124 07/13/18 1333   07/07/18 2130  vancomycin (VANCOCIN) 1,500 mg in sodium chloride 0.9 % 500 mL IVPB     1,500 mg 250 mL/hr over 120 Minutes Intravenous  Once 07/07/18 2123 07/08/18 0028   07/07/18 2115  vancomycin (VANCOCIN) IVPB 1000 mg/200 mL premix  Status:  Discontinued     1,000 mg 200 mL/hr over 60 Minutes Intravenous  Once 07/07/18 2110 07/07/18 2128   07/07/18 2115  ceFEPIme (MAXIPIME) 2 g in sodium chloride 0.9 % 100 mL IVPB     2 g 200 mL/hr over 30 Minutes Intravenous  Once 07/07/18 2110 07/07/18 2203      Subjective: Without complaints  Objective: Vitals:   07/14/18 0416 07/14/18 0730 07/14/18 1021 07/14/18 1116  BP: (!) 147/80  111/69 111/69  Pulse: 87  (!) 108 (!) 108  Resp: 19 (!) 23 14   Temp: 98.2 F (36.8 C)  99.7 F (37.6 C)   TempSrc: Oral  Oral   SpO2: 93%  94%   Weight: 62.3 kg     Height:        Intake/Output Summary (Last 24 hours) at 07/14/2018 1146 Last data filed at 07/14/2018 0800 Gross per  24 hour  Intake 1217.57 ml  Output 300 ml  Net 917.57 ml   Filed Weights   07/12/18 0400 07/13/18 0519 07/14/18 0416  Weight: 66.1 kg 66.3 kg 62.3 kg    Examination: General exam: Awake, laying in bed, in nad Respiratory system: Normal respiratory effort, no wheezing Cardiovascular system: regular rate, s1, s2 Gastrointestinal system: Soft, nondistended, positive BS Central nervous system: CN2-12 grossly intact, strength intact Extremities: Perfused, no clubbing Skin: Normal skin turgor, no notable skin lesions seen Psychiatry: Mood normal // no visual hallucinations   Data Reviewed: I have personally reviewed following labs and imaging studies  CBC: Recent Labs  Lab 07/07/18 1845  07/11/18 0446 07/11/18 1949 07/12/18 0356 07/13/18 0401 07/14/18 0457  WBC 27.4*   < > 19.9* 29.1* 23.7* 19.8* 21.1*  NEUTROABS 22.6*  --   --   --   --   --   --   HGB 10.9*   < > 9.8* 9.4* 8.8* 10.2* 9.9*  HCT 35.7*   < > 31.9* 30.0* 29.4* 34.3* 32.9*  MCV 84.6   < > 83.1 82.4 82.6 83.9 81.8  PLT 424*   < > 471* 438* 426* 438* 476*   < > = values in this interval not displayed.   Basic Metabolic Panel: Recent Labs  Lab 07/10/18 0536 07/11/18 0446 07/11/18 1949 07/12/18 0356 07/14/18 0457  NA 134* 132* 130* 133* 135  K 3.3* 2.9* 5.0 4.5 3.5  CL 102 98 98 99 102  CO2 20* 23 21* 20* 23  GLUCOSE 110* 89 116* 87 79  BUN 8 7* 7* 8 5*  CREATININE 0.54 0.48 0.62 0.72 0.58  CALCIUM 8.5* 7.8* 8.1* 8.3* 8.1*  MG 1.7  --  1.5* 1.7  --    GFR: Estimated Creatinine Clearance: 62.7 mL/min (by C-G formula based on SCr of 0.58 mg/dL). Liver Function Tests: Recent Labs  Lab 07/07/18 1845 07/08/18 0406 07/09/18 0536 07/11/18 1949  AST 50* 41 44* 36  ALT 21 18 18 14   ALKPHOS 150* 131* 134* 119  BILITOT 0.9 0.8 0.8 0.8  PROT 6.3* 5.6* 5.9* 5.3*  ALBUMIN 2.1* 1.9* 1.9* 1.6*   Recent Labs  Lab 07/08/18 1325  LIPASE 65*   No results for input(s): AMMONIA in the last 168 hours.  Coagulation Profile: Recent Labs  Lab 07/07/18 1846 07/10/18 1410  INR 1.7* 1.3*   Cardiac Enzymes: Recent Labs  Lab 07/07/18 1845  CKTOTAL 30*  TROPONINI <0.03   BNP (last 3 results) No results for input(s): PROBNP in the last 8760 hours. HbA1C: No results for input(s): HGBA1C in the last 72 hours. CBG: Recent Labs  Lab 07/07/18 1951 07/11/18 1715  GLUCAP 150* 124*   Lipid Profile: No results for input(s): CHOL, HDL, LDLCALC, TRIG, CHOLHDL, LDLDIRECT in the last 72 hours. Thyroid Function Tests: No results for input(s): TSH, T4TOTAL, FREET4, T3FREE, THYROIDAB in the last 72 hours. Anemia Panel: No results for input(s): VITAMINB12, FOLATE, FERRITIN, TIBC, IRON, RETICCTPCT in the last 72 hours. Sepsis Labs: Recent Labs  Lab 07/08/18 0052 07/08/18 0406 07/09/18 0536 07/10/18 0536 07/11/18 0446  PROCALCITON  --  0.69 0.47 0.76  --   LATICACIDVEN 1.4 1.3 1.1  --  1.0    Recent Results (from the past 240 hour(s))  Blood culture (routine x 2)     Status: None   Collection Time: 07/07/18  8:02 PM  Result Value Ref Range Status   Specimen Description BLOOD RIGHT ANTECUBITAL  Final   Special Requests   Final    BOTTLES DRAWN AEROBIC AND ANAEROBIC Blood Culture adequate volume   Culture   Final    NO GROWTH 5 DAYS Performed at [facility], [address] [place], [place] [address]    Report Status 07/12/2018 FINAL  Final  Blood culture (routine x 2)     Status: None   Collection Time: 07/07/18  8:07 PM  Result Value Ref Range Status   Specimen Description BLOOD RIGHT HAND  Final   Special Requests   Final    BOTTLES DRAWN AEROBIC AND ANAEROBIC Blood Culture adequate volume   Culture   Final    NO GROWTH 5 DAYS Performed at [facility], [address] [place], [place] [address]    Report Status 07/12/2018 FINAL  Final  MRSA PCR Screening     Status: None   Collection Time: 07/08/18 12:37 AM  Result Value Ref Range Status   MRSA by PCR NEGATIVE NEGATIVE  Final    Comment:        The GeneXpert MRSA Assay (FDA approved for NASAL specimens only), is one component of a comprehensive MRSA colonization surveillance program. It is not intended to diagnose MRSA infection nor to guide or monitor treatment for MRSA infections. Performed at [facility], [address], [place], [place] [address]      Radiology Studies: No results found.  Scheduled Meds: . atorvastatin  40 mg Oral Daily  . Chlorhexidine Gluconate Cloth  6 each Topical Q0600  . feeding supplement (PRO-STAT SUGAR FREE 64)  30 mL Oral BID  . folic acid  1 mg Oral Daily  . lidocaine  1 patch Transdermal Q24H  . LORazepam  0-4 mg Intravenous Q12H  . metoprolol tartrate  50 mg Oral BID  . multivitamin with minerals  1 tablet Oral Daily  . nicotine  21 mg Transdermal Daily  . pantoprazole  40 mg Oral Q1200  . potassium chloride  40 mEq Oral Daily  . pregabalin  200 mg Oral TID  . thiamine  100 mg Oral Daily   Or  . thiamine  100 mg Intravenous Daily   Continuous Infusions: . sodium chloride 100 mL (07/09/18 0639)     LOS: 7 days   Rickey Barbara, MD [facility] Pager On Amion  If 7PM-7AM, please contact night-coverage 07/14/2018, 11:46 AM

## 2018-07-14 NOTE — Consult Note (Signed)
   [facility] CM Inpatient Consult   07/14/2018  [name] [birth year] [id number]    Patient was evaluated for [facility] Care Management services as benefit of Medicare/ Next Gen plan.   Noted that patient is not currently a beneficiary of the attributed ACO Registry in the [employer]     This patient is Not eligible for [facility] Care Management Services.  Latest membership roster was used to verify non- eligible status.  Will sign off for now.   For questions and additional information, please contact:  Jadalyn Oliveri A. Sura Canul, BSN, RN-BC [facility] Liaison Cell: [phone]

## 2018-07-14 NOTE — Op Note (Signed)
[facility] Patient Name: [name] Procedure Date : 07/14/2018 MRN: [id number] Attending MD: Beverley Fiedler , MD Date of Birth: [birth year] CSN: [id number] Age: 73 Admit Type: Inpatient Procedure:                Upper GI endoscopy Indications:              Lower abdominal pain, Nausea, Weight loss Providers:                Carie Caddy. Rhea Belton, MD, Vicki Mallet, RN, Clearnce Sorrel,                            RN, Matthew Folks, Technician, Verita Schneiders,                            Technician Referring MD:             Janetta Hora. Fields, MD; [facility] Medicines:                General Anesthesia Complications:            No immediate complications. Estimated Blood Loss:     Estimated blood loss was minimal. Procedure:                Pre-Anesthesia Assessment:                           - Prior to the procedure, a History and Physical                            was performed, and patient medications and                            allergies were reviewed. The patient's tolerance of                            previous anesthesia was also reviewed. The risks                            and benefits of the procedure and the sedation                            options and risks were discussed with the patient.                            All questions were answered, and informed consent                            was obtained. Prior Anticoagulants: The patient has                            taken heparin, last dose was day of procedure. ASA                            Grade Assessment: III - A patient with severe  systemic disease. After reviewing the risks and                            benefits, the patient was deemed in satisfactory                            condition to undergo the procedure.                           After obtaining informed consent, the endoscope was                            passed under direct vision. Throughout the                         procedure, the patient's blood pressure, pulse, and                            oxygen saturations were monitored continuously. The                            GIF-H190 ([id number] Olympus gastroscope was                            introduced through the mouth, and advanced to the                            second part of duodenum. The upper GI endoscopy was                            accomplished without difficulty. The patient                            tolerated the procedure well. Scope In: Scope Out: Findings:      The examined esophagus was normal.      Diffuse severe inflammation characterized by congestion (edema),       erythema, deep ulcerations and shallow ulcerations was found in the       entire examined stomach. The mucosa was very pale in the gastric body       and antrum. The ulcerations are primarily located in gastric body. These       findings are most suspicious for ischemia. Biopsies were taken with a       cold forceps for histology.      The examined duodenum was normal. Impression:               - Normal esophagus.                           - Severe ulcerative gastritis most consistent with                            ischemia. Biopsied.                           - Normal examined duodenum. Moderate Sedation:  N/A Recommendation:           - Return patient to hospital ward for ongoing care.                           - Clear liquid diet.                           - Advance diet as tolerated.                           - Await pathology results.                           - Twice daily PPI and NSAID avoidance.                           - Vascular surgery planning angiography. Procedure Code(s):        --- Professional ---                           [phone], Esophagogastroduodenoscopy, flexible,                            transoral; with biopsy, single or multiple Diagnosis Code(s):        --- Professional ---                           K29.70,  Gastritis, unspecified, without bleeding                           R10.30, Lower abdominal pain, unspecified                           R11.0, Nausea                           R63.4, Abnormal weight loss CPT copyright 2019 American Medical Association. All rights reserved. The codes documented in this report are preliminary and upon coder review may  be revised to meet current compliance requirements. Beverley FiedlerJay M Markese Bloxham, MD 07/14/2018 3:35:58 PM This report has been signed electronically. Number of Addenda: 0

## 2018-07-14 NOTE — Interval H&P Note (Signed)
History and Physical Interval Note: EGD/flex sig today for nausea, weight loss, abnl GI imaging The nature of the procedure, as well as the risks, benefits, and alternatives were carefully and thoroughly reviewed with the patient. Ample time for discussion and questions allowed. The patient understood, was satisfied, and agreed to proceed.   CBC Latest Ref Rng & Units 07/14/2018 07/13/2018 07/12/2018  WBC 4.0 - 10.5 K/uL 21.1(H) 19.8(H) 23.7(H)  Hemoglobin 12.0 - 15.0 g/dL 0.6(T) 10.2(L) 8.8(L)  Hematocrit 36.0 - 46.0 % 32.9(L) 34.3(L) 29.4(L)  Platelets 150 - 400 K/uL 476(H) 438(H) 426(H)     07/14/2018 2:15 PM  [name]  has presented today for surgery, with the diagnosis of EGD and flexible sigmoidoscopy.  The various methods of treatment have been discussed with the patient and family. After consideration of risks, benefits and other options for treatment, the patient has consented to  Procedure(s): ESOPHAGOGASTRODUODENOSCOPY (EGD) (N/A) FLEXIBLE SIGMOIDOSCOPY (N/A) as a surgical intervention.  The patient's history has been reviewed, patient examined, no change in status, stable for surgery.  I have reviewed the patient's chart and labs.  Questions were answered to the patient's satisfaction.     [name]

## 2018-07-14 NOTE — Transfer of Care (Signed)
Immediate Anesthesia Transfer of Care Note  Patient: [name]  Procedure(s) Performed: ESOPHAGOGASTRODUODENOSCOPY (EGD) (N/A ) FLEXIBLE SIGMOIDOSCOPY (N/A ) BIOPSY  Patient Location: Endoscopy Unit  Anesthesia Type:General  Level of Consciousness: awake, alert  and oriented  Airway & Oxygen Therapy: Patient Spontanous Breathing and Patient connected to face mask oxygen  Post-op Assessment: Report given to RN and Post -op Vital signs reviewed and stable  Post vital signs: Reviewed and stable  Last Vitals:  Vitals Value Taken Time  BP 173/56 07/14/2018  3:18 PM  Temp    Pulse 70 07/14/2018  3:20 PM  Resp 24 07/14/2018  3:20 PM  SpO2 99 % 07/14/2018  3:20 PM  Vitals shown include unvalidated device data.  Last Pain:  Vitals:   07/14/18 1406  TempSrc: Oral  PainSc: 7       Patients Stated Pain Goal: 0 (07/13/18 0400)  Complications: No apparent anesthesia complications

## 2018-07-14 NOTE — Anesthesia Procedure Notes (Signed)
Procedure Name: Intubation Date/Time: 07/14/2018 2:53 PM Performed by: Teressa Lower., CRNA Pre-anesthesia Checklist: Patient identified, Emergency Drugs available, Suction available and Patient being monitored Patient Re-evaluated:Patient Re-evaluated prior to induction Oxygen Delivery Method: Circle system utilized Preoxygenation: Pre-oxygenation with 100% oxygen Induction Type: IV induction and Rapid sequence Laryngoscope Size: Mac and 3 Grade View: Grade I Tube type: Oral Tube size: 7.0 mm Number of attempts: 1 Airway Equipment and Method: Stylet Placement Confirmation: ETT inserted through vocal cords under direct vision,  positive ETCO2 and breath sounds checked- equal and bilateral Secured at: 21 cm Tube secured with: Tape Dental Injury: Teeth and Oropharynx as per pre-operative assessment

## 2018-07-15 ENCOUNTER — Inpatient Hospital Stay (HOSPITAL_COMMUNITY): Payer: Medicare Other

## 2018-07-15 DIAGNOSIS — E785 Hyperlipidemia, unspecified: Secondary | ICD-10-CM

## 2018-07-15 DIAGNOSIS — I739 Peripheral vascular disease, unspecified: Secondary | ICD-10-CM

## 2018-07-15 DIAGNOSIS — R630 Anorexia: Secondary | ICD-10-CM

## 2018-07-15 DIAGNOSIS — K2960 Other gastritis without bleeding: Secondary | ICD-10-CM

## 2018-07-15 DIAGNOSIS — Z8719 Personal history of other diseases of the digestive system: Secondary | ICD-10-CM

## 2018-07-15 DIAGNOSIS — K631 Perforation of intestine (nontraumatic): Secondary | ICD-10-CM

## 2018-07-15 DIAGNOSIS — R188 Other ascites: Secondary | ICD-10-CM

## 2018-07-15 DIAGNOSIS — D72829 Elevated white blood cell count, unspecified: Secondary | ICD-10-CM

## 2018-07-15 DIAGNOSIS — Z8701 Personal history of pneumonia (recurrent): Secondary | ICD-10-CM

## 2018-07-15 DIAGNOSIS — I10 Essential (primary) hypertension: Secondary | ICD-10-CM

## 2018-07-15 DIAGNOSIS — Z95820 Peripheral vascular angioplasty status with implants and grafts: Secondary | ICD-10-CM

## 2018-07-15 DIAGNOSIS — I2510 Atherosclerotic heart disease of native coronary artery without angina pectoris: Secondary | ICD-10-CM

## 2018-07-15 DIAGNOSIS — Z7901 Long term (current) use of anticoagulants: Secondary | ICD-10-CM

## 2018-07-15 DIAGNOSIS — J449 Chronic obstructive pulmonary disease, unspecified: Secondary | ICD-10-CM

## 2018-07-15 DIAGNOSIS — F17210 Nicotine dependence, cigarettes, uncomplicated: Secondary | ICD-10-CM

## 2018-07-15 LAB — CBC
HCT: 36.1 % (ref 36.0–46.0)
Hemoglobin: 10.8 g/dL — ABNORMAL LOW (ref 12.0–15.0)
MCH: 24.7 pg — ABNORMAL LOW (ref 26.0–34.0)
MCHC: 29.9 g/dL — ABNORMAL LOW (ref 30.0–36.0)
MCV: 82.6 fL (ref 80.0–100.0)
Platelets: 454 10*3/uL — ABNORMAL HIGH (ref 150–400)
RBC: 4.37 MIL/uL (ref 3.87–5.11)
RDW: 18.4 % — ABNORMAL HIGH (ref 11.5–15.5)
WBC: 24.6 10*3/uL — ABNORMAL HIGH (ref 4.0–10.5)
nRBC: 0.2 % (ref 0.0–0.2)

## 2018-07-15 LAB — CBC WITH DIFFERENTIAL/PLATELET
Abs Immature Granulocytes: 0.36 10*3/uL — ABNORMAL HIGH (ref 0.00–0.07)
Basophils Absolute: 0.1 10*3/uL (ref 0.0–0.1)
Basophils Relative: 0 %
Eosinophils Absolute: 0 10*3/uL (ref 0.0–0.5)
Eosinophils Relative: 0 %
HCT: 36.8 % (ref 36.0–46.0)
Hemoglobin: 10.9 g/dL — ABNORMAL LOW (ref 12.0–15.0)
Immature Granulocytes: 1 %
Lymphocytes Relative: 8 %
Lymphs Abs: 2 10*3/uL (ref 0.7–4.0)
MCH: 24.9 pg — ABNORMAL LOW (ref 26.0–34.0)
MCHC: 29.6 g/dL — ABNORMAL LOW (ref 30.0–36.0)
MCV: 84 fL (ref 80.0–100.0)
Monocytes Absolute: 0.8 10*3/uL (ref 0.1–1.0)
Monocytes Relative: 3 %
Neutro Abs: 21.9 10*3/uL — ABNORMAL HIGH (ref 1.7–7.7)
Neutrophils Relative %: 88 %
Platelets: 448 10*3/uL — ABNORMAL HIGH (ref 150–400)
RBC: 4.38 MIL/uL (ref 3.87–5.11)
RDW: 18.3 % — ABNORMAL HIGH (ref 11.5–15.5)
WBC: 25 10*3/uL — ABNORMAL HIGH (ref 4.0–10.5)
nRBC: 0.2 % (ref 0.0–0.2)

## 2018-07-15 LAB — COMPREHENSIVE METABOLIC PANEL
ALT: 13 U/L (ref 0–44)
AST: 28 U/L (ref 15–41)
Albumin: 1.8 g/dL — ABNORMAL LOW (ref 3.5–5.0)
Alkaline Phosphatase: 121 U/L (ref 38–126)
Anion gap: 13 (ref 5–15)
BUN: 9 mg/dL (ref 8–23)
CO2: 24 mmol/L (ref 22–32)
Calcium: 8.6 mg/dL — ABNORMAL LOW (ref 8.9–10.3)
Chloride: 99 mmol/L (ref 98–111)
Creatinine, Ser: 0.58 mg/dL (ref 0.44–1.00)
GFR calc Af Amer: >60 mL/min (ref >60)
GFR calc non Af Amer: >60 mL/min (ref >60)
Glucose, Bld: 125 mg/dL — ABNORMAL HIGH (ref 70–99)
Potassium: 4.3 mmol/L (ref 3.5–5.1)
Sodium: 136 mmol/L (ref 135–145)
Total Bilirubin: 0.7 mg/dL (ref 0.3–1.2)
Total Protein: 6 g/dL — ABNORMAL LOW (ref 6.5–8.1)

## 2018-07-15 LAB — HEPARIN LEVEL (UNFRACTIONATED): Heparin Unfractionated: 0.61 [IU]/mL (ref 0.30–0.70)

## 2018-07-15 LAB — GLUCOSE, CAPILLARY: Glucose-Capillary: 133 mg/dL — ABNORMAL HIGH (ref 70–99)

## 2018-07-15 MED ORDER — ENSURE ENLIVE PO LIQD
237.0000 mL | Freq: Three times a day (TID) | ORAL | Status: DC
  Administered 2018-07-15 – 2018-07-17 (×3): 237 mL via ORAL

## 2018-07-15 MED ORDER — SODIUM CHLORIDE 0.9 % IV SOLN
1.0000 g | Freq: Three times a day (TID) | INTRAVENOUS | Status: DC
  Administered 2018-07-15 – 2018-08-08 (×73): 1 g via INTRAVENOUS
  Filled 2018-07-15 (×79): qty 1

## 2018-07-15 MED ORDER — IOPAMIDOL (ISOVUE-300) INJECTION 61%
100.0000 mL | Freq: Once | INTRAVENOUS | Status: AC | PRN
  Administered 2018-07-15: 100 mL via INTRAVENOUS

## 2018-07-15 NOTE — Progress Notes (Signed)
Initial Nutrition Assessment  DOCUMENTATION CODES:   Severe malnutrition in context of acute illness/injury  INTERVENTION:   D/C Prostat   Continue Ensure Enlive po TID, each supplement provides 350 kcal and 20 grams of protein  Continue MVI daily  NUTRITION DIAGNOSIS:   Severe Malnutrition related to acute illness(probable ischemic colitis) as evidenced by percent weight loss, energy intake < or equal to 50% for > or equal to 5 days.  GOAL:   Patient will meet greater than or equal to 90% of their needs  MONITOR:   PO intake, Supplement acceptance, Labs, Skin, I & O's, Weight trends  REASON FOR ASSESSMENT:   Consult Assessment of nutrition requirement/status  ASSESSMENT:   Patient with PMH significant for GERD, HLD, HTN, PVD s/p aortofem bypass, s/p RLE thromboembolectomy and R common emoral artery patch angioplasty 05/31/18, and tobacco abuse. Presents this admission with sepsis secondary to PNA and probable ischemic colitis.   4/14- flex sig  4/15- diet advanced to heart healthy  RD working remotely.  Spoke with pt via phone. Pt endorses having a loss in appetite since January due to extreme feelings of heartburn. States during this time she would "pick" throughout the day on food options like creamed potatoes, broccoli, and pimento cheese sandwiches. This week her intake decreased to bites. Meal completions charted this admission as 0-25% for pt's last five meals. She attempted to eat eggs this am but started to gag so she stopped. Discussed the importance of protein intake for preservation of lean body mass. Pt amendable to Ensure.   Pt reports a UBW of 150-155 lb and an unintentional wt loss of 40 lb since January. Records indicate pt weighed 173 lb on 1/17 and 137 lb this admission (21% wt loss in 4 months, significant for time frame).   Unable to complete Nutrition-Focused physical exam at this time.   Medications: folic acid, MVI with minerals, 40 mEq KCL once  daily, thiamine Labs: CBG 79-125  Diet Order:   Diet Order            Diet Heart Room service appropriate? Yes; Fluid consistency: Thin  Diet effective now              EDUCATION NEEDS:   Education needs have been addressed  Skin:  Skin Assessment: Skin Integrity Issues: Skin Integrity Issues:: Incisions, Other (Comment) Incisions: left groin  Other: left toe  Last BM:  4/14  Height:   Ht Readings from Last 1 Encounters:  07/14/18 5\' 7"  (1.702 m)    Weight:   Wt Readings from Last 1 Encounters:  07/15/18 63 kg    Ideal Body Weight:  61.4 kg  BMI:  Body mass index is 21.77 kg/m.  Estimated Nutritional Needs:   Kcal:  1800-2000 kcal  Protein:  90-105 grams  Fluid:  >/= 1.8 L/day   Vanessa Kick RD, LDN Clinical Nutrition Pager # - [phone]

## 2018-07-15 NOTE — Consult Note (Signed)
Date of Admission:  07/07/2018          Reason for Consult: Leukocytosis and intra-abdominal fluid collections    Referring Provider: Dr. Alanda Slim   Assessment:  1. Colonic perforation likely due to ischemia along with  2. Severe ischemic gastritis, with severe abdominal pain, anorexia and 40 pound weight loss 3. Pancreatic pseudocyst which had evolved 4. Severe peripheral vascular disease status post aortobifemoral bypass grafting in March 2020, during which time she was also found to have  5.  CFA SFA for profunda femoris clot and underwent thrombectomy patch angioplasty and was started on Eliquis 6. Chronic dry gangrene of left toes 7. Fluid collection near Vascular surgical site and in groin 8. Recent treatment for PNA though I suspect this was really atelectasis  Plan:  1. Surgery appear to be seeing patient and would think she would need emergent ex-lap and colectomy 2. I would start IV merrem to cover her abdominal perforation  Dr. Lorenso Courier to pick up service tomorrow.  Principal Problem:   Sepsis (HCC) Active Problems:   Hyperlipidemia with target LDL less than 100   Essential hypertension   Peripheral vascular disease (HCC)   Severe protein-calorie malnutrition (HCC)   Abnormal liver function   Loss of weight   Nausea without vomiting   Lower abdominal pain   Abnormal CT scan, colon   Scheduled Meds: . atorvastatin  40 mg Oral Daily  . Chlorhexidine Gluconate Cloth  6 each Topical Q0600  . feeding supplement (ENSURE ENLIVE)  237 mL Oral TID BM  . folic acid  1 mg Oral Daily  . lidocaine  1 patch Transdermal Q24H  . LORazepam  0-4 mg Intravenous Q12H  . metoprolol tartrate  75 mg Oral BID  . multivitamin with minerals  1 tablet Oral Daily  . nicotine  21 mg Transdermal Daily  . pantoprazole  40 mg Oral BID AC  . potassium chloride  40 mEq Oral Daily  . pregabalin  200 mg Oral TID  . thiamine  100 mg Oral Daily   Or  . thiamine  100 mg Intravenous Daily   Continuous Infusions: . sodium chloride 100 mL (07/09/18 0639)  . heparin 1,450 Units/hr (07/15/18 0533)   PRN Meds:.sodium chloride, acetaminophen **OR** acetaminophen, alum & mag hydroxide-simeth, hydrALAZINE, ipratropium-albuterol, labetalol, ondansetron (ZOFRAN) IV, senna-docusate, tiZANidine  HPI: [name] is a 73 y.o. female  with hypertension, hyperlipidemia, continued tobacco use, peripheral vascular disease status post remote femoral-popliteal bypass, CAD, COPD, pancreatitis (CT evidence February 2020). In February 2020 she presented to the ED after a fall and during her hospitalization she developed discoloration and coldness to the right lower extremity.  CT angiogram showed complete occlusion to the right iliac limb bypass, she is found to have a clot CFA, SFA profundus femoris and underwent thrombectomy, patch angioplasty May 31, 2018.  She was started on Eliquis.  [facility] was [facility] to [facility] for [facility].  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  She had findings consistent with pancreatitis specifically in the tail the pancreas.  There was a 3.2 x 3.2 cm low-attenuation lesion/collection in the tail the pancreas most consistent with a pseudocyst although neoplastic process not excluded entirely but felt to be less likely.  2.8 x 2 cm fluid collection along the greater curvature the stomach likely a pseudocyst.  Not too long after she was discharged to home she had an apparent syncopal event at home and was hospitalized [facility]  There chest x-ray had shown atelectasis versus early pneumonia and she was initiated on antibiotics for pneumonia and possible sepsis with vancomycin and cefepime followed by cefepime alone.  In the interim noted to have elevated elevated liver function tests and severe abdominal pain.  CT  scan of the abdomen pelvis performed 9 April which showed:  wall thickening noted distal transverse colon and splenic flexure with proximal and mid descending colon showing posterior wall thickening but anterior colonic wall is imperceptible in this region. Distal descending colon into the sigmoid colon again shows circumferential wall thickening. There is prominent edema/inflammation around the proximal and mid descending colon with fluid/edema tracking into the left mesentery and pooling in the left paracolic gutter.   2. Mild distention of proximal and mid small bowel with decompressed small bowel loops. Features probably related to ileus from the above described colonic pathology . 3. Interval evolution of pancreatic tail pseudocyst, now with organized, enhancing rim and evidence of extension into the perinephric space, along the anterior capsule of the upper left Kidney.  4. Status post aortobifemoral bypass graft. Right iliac limb now opacifies. Small fluid collection identified around the distal aspect of the right external iliac limb with increased attenuation, suggesting hematoma.  5. Probable small seroma or hematoma in the right groin.  Was transferred [facility] and seen by y both gastroenterology and vascular surgery.  GI performed an upper EGD which showed severe erosive gastritis thought to be due to ischemia.  Today I was called to the hospital because he was concerned by the fact that the patient had worsening leukocytosis and he was concerned that some of this intra-abdominal pathology including fluid collections could represent abscesses.  I had suggested trying to have someone sample 1 of these fluid collections.  In the interim however she has had a repeat CT scan which shows now that she now has frank perforation of her colon.  General surgery of been consulted nursing the patient.  I am initiating meropenem   Review of Systems: Review of  Systems  Constitutional: Positive for malaise/fatigue and weight loss. Negative for chills, diaphoresis and fever.  HENT: Negative for congestion, hearing loss, sore throat and tinnitus.   Eyes: Negative for blurred vision and double vision.  Respiratory: Negative for cough, sputum production, shortness of breath and wheezing.   Cardiovascular: Positive for claudication. Negative for chest pain, palpitations and leg swelling.  Gastrointestinal: Positive for abdominal pain, heartburn, nausea and vomiting. Negative for blood in stool, constipation, diarrhea and melena.  Genitourinary: Negative for dysuria, flank pain and hematuria.  Musculoskeletal: Positive for falls. Negative for back pain, joint pain and myalgias.  Skin: Negative for itching and rash.  Neurological: Negative for dizziness, sensory change, focal weakness, loss of consciousness, weakness and headaches.  Endo/Heme/Allergies: Bruises/bleeds easily.  Psychiatric/Behavioral: Negative for depression, memory loss and suicidal ideas. The patient is not nervous/anxious.     Past Medical History:  Diagnosis Date  . Chronic back pain   . GERD (gastroesophageal reflux disease)   . Hypercholesterolemia   . Hyperlipidemia   . Hypertension   . Osteoarthritis of left knee    End-stage  . Palpitations   . PVD (peripheral vascular disease) (HCC)   . Tobacco user     Social History   Tobacco Use  . Smoking status: Current Every Day Smoker  Packs/day: 1.00    Years: 52.00    Pack years: 52.00    Types: Cigarettes  . Smokeless tobacco: Never Used  Substance Use Topics  . Alcohol use: No    Comment: Occasionally  . Drug use: No    Family History  Problem Relation Age of Onset  . Heart failure Father        CHF  . Hypertension Father   . Heart disease Father   . Hypertension Paternal Aunt   . Hypertension Paternal Uncle   . Hypertension Paternal Grandmother   . Hypertension Paternal Grandfather   . Coronary artery  disease Neg Hx        Premature   No Known Allergies  OBJECTIVE: Blood pressure 130/62, pulse 63, temperature 98 F (36.7 C), temperature source Oral, resp. rate 17, height  (1.702 m), weight 63 kg, SpO2 96 %.  Physical Exam Constitutional:      Appearance: She is ill-appearing.  HENT:     Head: Normocephalic and atraumatic.     Nose: No congestion.  Eyes:     Extraocular Movements: Extraocular movements intact.  Neck:     Musculoskeletal: Normal range of motion.  Cardiovascular:     Rate and Rhythm: Tachycardia present.  Pulmonary:     Effort: Pulmonary effort is normal. No respiratory distress.     Breath sounds: No stridor. No wheezing or rhonchi.  Abdominal:     Tenderness: There is abdominal tenderness. There is guarding.  Musculoskeletal: Normal range of motion.  Neurological:     General: No focal deficit present.     Mental Status: She is alert and oriented to person, place, and time.  Psychiatric:        Attention and Perception: Attention normal.        Mood and Affect: Mood normal.        Speech: Speech normal.        Behavior: Behavior normal.        Thought Content: Thought content normal.     Lab Results Lab Results  Component Value Date   WBC 24.6 (H) 07/15/2018   WBC 25.0 (H) 07/15/2018   HGB 10.8 (L) 07/15/2018   HGB 10.9 (L) 07/15/2018   HCT 36.1 07/15/2018   HCT 36.8 07/15/2018   MCV 82.6 07/15/2018   MCV 84.0 07/15/2018   PLT 454 (H) 07/15/2018   PLT 448 (H) 07/15/2018    Lab Results  Component Value Date   CREATININE 0.58 07/15/2018   BUN 9 07/15/2018   NA 136 07/15/2018   K 4.3 07/15/2018   CL 99 07/15/2018   CO2 24 07/15/2018    Lab Results  Component Value Date   ALT 13 07/15/2018   AST 28 07/15/2018   ALKPHOS 121 07/15/2018   BILITOT 0.7 07/15/2018     Microbiology: Recent Results (from the past 240 hour(s))  Blood culture (routine x 2)     Status: None   Collection Time: 07/07/18  8:02 PM  Result Value Ref Range  Status   Specimen Description BLOOD RIGHT ANTECUBITAL  Final   Special Requests   Final    BOTTLES DRAWN AEROBIC AND ANAEROBIC Blood Culture adequate volume   Culture   Final    NO GROWTH 5 DAYS Performed at [facility], [address], [place], [place] [address]    Report Status 07/12/2018 FINAL  Final  Blood culture (routine x 2)     Status: None   Collection Time: 07/07/18  8:07 PM  Result Value Ref Range Status   Specimen Description BLOOD RIGHT HAND  Final   Special Requests   Final    BOTTLES DRAWN AEROBIC AND ANAEROBIC Blood Culture adequate volume   Culture   Final    NO GROWTH 5 DAYS Performed at [facility], [address], [place], [place] [address]    Report Status 07/12/2018 FINAL  Final  MRSA PCR Screening     Status: None   Collection Time: 07/08/18 12:37 AM  Result Value Ref Range Status   MRSA by PCR NEGATIVE NEGATIVE Final    Comment:        The GeneXpert MRSA Assay (FDA approved for NASAL specimens only), is one component of a comprehensive MRSA colonization surveillance program. It is not intended to diagnose MRSA infection nor to guide or monitor treatment for MRSA infections. Performed at [facility], [address], [place], [place] [address]     Acey Lav, MD [facility] [phone] pager  07/15/2018, 3:46 PM

## 2018-07-15 NOTE — Progress Notes (Signed)
ANTICOAGULATION CONSULT NOTE   Pharmacy Consult for heparin  Indication: atrial fibrillation  No Known Allergies  Patient Measurements: Height: 5\' 7"  (170.2 cm) Weight: 139 lb (63 kg) IBW/kg (Calculated) : 61.6  HEPARIN DW (KG): 62.3   Vital Signs: Temp: 97.8 F (36.6 C) (04/15 0837) Temp Source: Oral (04/15 0837) BP: 136/64 (04/15 0845) Pulse Rate: 64 (04/15 0845)  Labs: Recent Labs    07/13/18 0401 07/14/18 0457 07/15/18 0442 07/15/18 0739  HGB 10.2* 9.9* 10.8*  --   HCT 34.3* 32.9* 36.1  --   PLT 438* 476* 454*  --   HEPARINUNFRC 0.54 0.44 0.61  --   CREATININE  --  0.58  --  0.58    Estimated Creatinine Clearance: 62.7 mL/min (by C-G formula based on SCr of 0.58 mg/dL).   Assessment: 73 y.o. female with h/o Afib, Eliquis on hold, for heparin. Last apixaban dose was 10am on 07/08/18.Marland Kitchen Heparin level and APTT began correlating ~ 4/10, thus monitoring heparin via heparin levels.   S/p upper endoscopy 07/14/18 found Severe ulcerative gastritis most consistent ischemia, biopsied.  Heparin resumed s/p EGD.   Heparin level therapeutic at 0.61 this AM, on heparin rate 1450 units/hr.  Hgb low/stable, pltc 454, no bleeding reported at this time.  Goal of Therapy:  Heparin level 0.3-0.7 units/ml Monitor platelets by anticoagulation protocol: Yes   Plan:  Continue heparin gtt at 1450 units/hr Daily heparin level, CBC, s/s bleeding  Noah Delaine, RPh Clinical Pharmacist Please check AMION for all [facility] Pharmacy numbers 07/15/2018 10:24 AM

## 2018-07-15 NOTE — Progress Notes (Signed)
PROGRESS NOTE  [name] ZOX:[id number] DOB: 1946/01/07 DOA: 07/07/2018 PCP: [name], [name], [name]   LOS: 8 days   Brief Narrative / Interim history:  73 year old female with history of chronic N/V, A. fib, PVD status post right femoral thrombectomy on 05/31/2018, hypertension and chronic back pain presenting with intractable nausea, vomiting, abdominal pain, new onset of diarrhea and about 30 pounds weight loss in the last several months.   She was admitted to [facility] 5 days ago with somnolence and a diagnosis of sepsis. She has had a prior aortobifem by Dr Hart RochesterLawson in 2002 and had thrombectomy of the right limb of this by Dr Randie Heinzain 3/20. 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 Her left internal iliac artery was patent at that point.   Repeat CT on 07/09/18 is unchanged but not dedicated arterial phase so difficult to know if internal iliac arteries are patent. 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   Patient was [facility] management.  Gastroenterology and vascular surgery consulted.   Subjective: No major events overnight of this morning.  Continues to endorse low abdominal pain which is worse with palpation.  Denies chest pain, dyspnea or dysuria.  Had EGD yesterday that showed severe ulcerative gastritis most consistent with ischemia.  Biopsy taken.  Flex sigmoidoscopy showed only a 7 mm sessile polyp in rectum but poor prep.  Assessment & Plan: Principal Problem:   Sepsis (HCC) Active Problems:   Hyperlipidemia with target LDL less than 100   Essential hypertension   Peripheral vascular disease (HCC)   Severe protein-calorie malnutrition (HCC)   Abnormal liver function   Loss of weight   Nausea without vomiting   Lower abdominal pain   Abnormal CT scan, colon  Intractable nausea/vomiting/abdominal pain: differential diagnosis include ulcerative gastritis, mesenteric ischemia, inflammatory/ischemic colitis and malignancy -EGD on 4/14 showed severe ulcerative gastritis most consistent with ischemia -VSS on board for SMA stenosis but does not believe her symptoms are due to SMA -High-dose Cymbalta can cause GI symptoms. -PRN Zofran.  Possible ischemic colitis versus infectious/inflammatory colitis: CT abdomen and pelvis showed distal transverse colon and splenic flexure circumferential wall thickening and prominent edema/inflammation around the proximal to mid descending colon with fluid/edema concerning for ischemic colitis in the setting of celiac axis and IMA occlusion.  -GI and vascular surgery following.  Leukocytosis: Patient with persistent leukocytosis after  7 days course of cefepime for presumed pneumonia.  She denies respiratory symptoms at the present.  Concern about intra-abdominal infection/inflammatory process.  She has no fever but mild temp to 99.7 on 4/11 and 4/14.  She has no diarrhea to suggest C. Difficile.  She has tenderness to palpation.  CT shows colonic wall thickening as above, rim enhancing 2.5 x 3.3 cm fluid collection around pancreatic tail continuous with a 3.1 x 1.0 cm rim-enhancing fluid collection along the anterior margin of the upper pole of left kidney. Also there is a 3.9 x 1.8 cm and 2.6 x 1.1 cm fluid collection in right pelvis/groin thought to be seroma from prior surgery. -Check differential. -ID consulted-recommended starting Zosyn -IR, Dr. Carlynn PurlHen consulted and recommended repeat CT abdomen and pelvis with contrast -Per audiology, repeat CT abdomen showed contained perforated descending colon.  Formal reading. -  General surgery consulted  -May consider antibiotic with anaerobic coverage  Significant weight loss/moderate protein calorie malnutrition: Likely due to poor  p.o. intake from the above. -Appreciate nutrition input  History of COPD: Stable -Continue DuoNeb  Peripheral vascular disease with dry gangrene -Continue home statin -Outpatient vascular surgery follow-up  Essential hypertension: Normotensive. -Continue home meds  Chronic atrial fibrillation: Not in RVR.  [place] vas score 6.  On Eliquis and metoprolol at home. -Heparin and metoprolol  Sepsis secondary to questionable pneumonia: Sepsis physiology resolved. -Completed 6 days of cefepime.  Chronic pain: -Continue home medications (Lyrica, Zanaflex).  Tobacco use disorder: -Cessation counseling -Nicotine patch  Scheduled Meds: . atorvastatin  40 mg Oral Daily  . Chlorhexidine Gluconate Cloth  6 each Topical Q0600  . feeding supplement (PRO-STAT SUGAR FREE 64)  30 mL Oral BID  . folic acid  1 mg Oral Daily  . lidocaine  1 patch Transdermal Q24H  . LORazepam  0-4 mg Intravenous Q12H  . metoprolol tartrate  75 mg Oral BID  . multivitamin with minerals  1 tablet Oral Daily  . nicotine  21 mg Transdermal Daily  . pantoprazole  40 mg Oral BID AC  . potassium chloride  40 mEq Oral Daily  . pregabalin  200 mg Oral TID  . thiamine  100 mg Oral Daily   Or  . thiamine  100 mg Intravenous Daily   Continuous Infusions: . sodium chloride 100 mL (07/09/18 0639)  . heparin 1,450 Units/hr (07/15/18 0533)   PRN Meds:.sodium chloride, acetaminophen **OR** acetaminophen, alum & mag hydroxide-simeth, hydrALAZINE, ipratropium-albuterol, labetalol, ondansetron (ZOFRAN) IV, senna-docusate, tiZANidine  DVT prophylaxis: On heparin drip for atrial fibrillation. Code Status: Full code Family Communication: None at bedside.  Updated patient's daughter over the phone Disposition Plan: Remains inpatient  Consultants:   Vascular surgery  Gastroenterology  Procedures:   EGD on 4/14  Flex sigmoidoscopy on 4/14  Antimicrobials:  Vancomycin on 4/7  Cefepime 4/7-4/12  Objective:  Vitals:   07/14/18 2128 07/15/18 0515 07/15/18 0837 07/15/18 0845  BP: 137/64  136/64 136/64  Pulse: 70 70 64 64  Resp: 16 (!) 21 16   Temp:   97.8 F (36.6 C)   TempSrc:   Oral   SpO2: 90% 94% 94%   Weight:  63 kg    Height:        Intake/Output Summary (Last 24 hours) at 07/15/2018 1042 Last data filed at 07/15/2018 0837 Gross per 24 hour  Intake 1055.55 ml  Output 0 ml  Net 1055.55 ml   Filed Weights   07/14/18 0416 07/14/18 1406 07/15/18 0515  Weight: 62.3 kg 62.3 kg 63 kg    Examination:  GENERAL: Appears well. No acute distress.  EYES - vision grossly intact. Sclera anicteric.  NOSE- no gross deformity or drainage MOUTH - no oral lesions noted THROAT- no swelling or erythema LUNGS:  No IWOB.  Fair air movement bilaterally. HEART:   RRR. Heart sounds normal. ABD: Bowel sounds present. Soft.  Tenderness across lower abdomen MSK/EXT: Moves extremities. No obvious deformity. SKIN: no apparent skin lesion.  NEURO: Awake, alert and oriented appropriately.  No gross deficit.  PSYCH: Calm. Normal affect.    Data Reviewed: I have independently reviewed following labs and imaging studies  CBC: Recent Labs  Lab 07/11/18 1949 07/12/18 0356 07/13/18 0401 07/14/18 0457 07/15/18 0442  WBC 29.1* 23.7* 19.8* 21.1* 24.6*  HGB 9.4* 8.8* 10.2* 9.9* 10.8*  HCT 30.0* 29.4* 34.3* 32.9* 36.1  MCV 82.4  82.6 83.9 81.8 82.6  PLT 438* 426* 438* 476* 454*   Basic Metabolic Panel: Recent Labs  Lab 07/10/18 0536 07/11/18 0446 07/11/18 1949 07/12/18 0356 07/14/18 0457 07/15/18 0739  NA 134* 132* 130* 133* 135 136  K 3.3* 2.9* 5.0 4.5 3.5 4.3  CL 102 98 98 99 102 99  CO2 20* 23 21* 20* 23 24  GLUCOSE 110* 89 116* 87 79 125*  BUN 8 7* 7* 8 5* 9  CREATININE 0.54 0.48 0.62 0.72 0.58 0.58  CALCIUM 8.5* 7.8* 8.1* 8.3* 8.1* 8.6*  MG 1.7  --  1.5* 1.7  --   --    GFR: Estimated Creatinine Clearance: 62.7 mL/min (by C-G formula based on SCr of 0.58 mg/dL). Liver Function  Tests: Recent Labs  Lab 07/09/18 0536 07/11/18 1949 07/15/18 0739  AST 44* 36 28  ALT ALKPHOS 134* 119 121  BILITOT 0.8 0.8 0.7  PROT 5.9* 5.3* 6.0*  ALBUMIN 1.9* 1.6* 1.8*   Recent Labs  Lab 07/08/18 1325  LIPASE 65*   No results for input(s): AMMONIA in the last 168 hours. Coagulation Profile: Recent Labs  Lab 07/10/18 1410  INR 1.3*   Cardiac Enzymes: No results for input(s): CKTOTAL, CKMB, CKMBINDEX, TROPONINI in the last 168 hours. BNP (last 3 results) No results for input(s): PROBNP in the last 8760 hours. HbA1C: No results for input(s): HGBA1C in the last 72 hours. CBG: Recent Labs  Lab 07/11/18 1715  GLUCAP 124*   Lipid Profile: No results for input(s): CHOL, HDL, LDLCALC, TRIG, CHOLHDL, LDLDIRECT in the last 72 hours. Thyroid Function Tests: No results for input(s): TSH, T4TOTAL, FREET4, T3FREE, THYROIDAB in the last 72 hours. Anemia Panel: No results for input(s): VITAMINB12, FOLATE, FERRITIN, TIBC, IRON, RETICCTPCT in the last 72 hours. Urine analysis:    Component Value Date/Time   COLORURINE AMBER (A) 07/07/2018 1826   APPEARANCEUR HAZY (A) 07/07/2018 1826   LABSPEC 1.014 07/07/2018 1826   PHURINE 5.0 07/07/2018 1826   GLUCOSEU 50 (A) 07/07/2018 1826   HGBUR NEGATIVE 07/07/2018 1826   BILIRUBINUR NEGATIVE 07/07/2018 1826   KETONESUR NEGATIVE 07/07/2018 1826   PROTEINUR NEGATIVE 07/07/2018 1826   NITRITE NEGATIVE 07/07/2018 1826   LEUKOCYTESUR NEGATIVE 07/07/2018 1826   Sepsis Labs: Invalid input(s): PROCALCITONIN, LACTICIDVEN  Recent Results (from the past 240 hour(s))  Blood culture (routine x 2)     Status: None   Collection Time: 07/07/18  8:02 PM  Result Value Ref Range Status   Specimen Description BLOOD RIGHT ANTECUBITAL  Final   Special Requests   Final    BOTTLES DRAWN AEROBIC AND ANAEROBIC Blood Culture adequate volume   Culture   Final    NO GROWTH 5 DAYS Performed at [facility], [address] [place],  [place] [address]    Report Status 07/12/2018 FINAL  Final  Blood culture (routine x 2)     Status: None   Collection Time: 07/07/18  8:07 PM  Result Value Ref Range Status   Specimen Description BLOOD RIGHT HAND  Final   Special Requests   Final    BOTTLES DRAWN AEROBIC AND ANAEROBIC Blood Culture adequate volume   Culture   Final    NO GROWTH 5 DAYS Performed at [facility], [address] [place], [place] [address]    Report Status 07/12/2018 FINAL  Final  MRSA PCR Screening     Status: None   Collection Time: 07/08/18 12:37 AM  Result Value Ref Range  Status   MRSA by PCR NEGATIVE NEGATIVE Final    Comment:        The GeneXpert MRSA Assay (FDA approved for NASAL specimens only), is one component of a comprehensive MRSA colonization surveillance program. It is not intended to diagnose MRSA infection nor to guide or monitor treatment for MRSA infections. Performed at [facility], [address] [place], [place] [address]       Radiology Studies: No results found.  [name] T. [facility] Pager [phone]  If 7PM-7AM, please contact night-coverage [email or web address] Password [facility] 07/15/2018, 10:42 AM

## 2018-07-15 NOTE — Consult Note (Signed)
[facility] Surgery Consult Note  [name] November 07, 1945  [id number].    Requesting MD: Candelaria Stagers Chief Complaint/Reason for Consult: CT with perforation of descending colon  HPI:  [name] is a 73yo female PMH chronic N/V, A. 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 A friend found her down nearly 4 hours after falling. EMS found her to be orthostatic a mid found to have pneumonia and she was started on cefepime/vancomycin and admitted to the hospitalist service.  Patient has a history of prior aortobifem by Dr. Hart Rochester in 2002 and right femoral thrombectomy by Dr. Randie Heinz on 3/20.  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  Repeat CT that was done on 4/9 with circumferential wall thickening of the distal transverse colon and splenic flexure with proximal and mid descending colon.  The distal descending colon and sigmoid colon also showed circumferential wall thickening.  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  IR was consulted and patient was transferred to [facility].  GI was consulted. Patient underwent a EGD and a flex sig by Dr. Rhea Belton of GI yesterday.  Patient's EGD showed severe ulcerative gastritis consistent with ischemia.  Biopsies were taken. These are pending. On flex sig the patients area of probable ischemia in the descending colon and neck splenic flexure was not able be visualized today due to poor prep. Vascular planned to perform angio this week. Another CT scan was obtained today. This demonstrated perforation of the descending colon which appears to be predominantly contained within a portion of the omentum in the left side of the abdomen. General surgery was asked to consult.  Patient reports that she began having  decreased appetite ~4 months ago and nausea after eating. She reports bilateral lower abdominal pain, worse on the right for the last several months. She notes that this has not changed since admission.  She denies any fever, chills, melena, hematochezia. She reports normal BM's prior to bowel prep yesterday. She is continues to pass flatus. She reports nausea after eating but no emesis. She has no other complaints.   ROS: Review of Systems  Constitutional: Positive for malaise/fatigue and weight loss. Negative for chills and fever.  Gastrointestinal: Positive for abdominal pain, diarrhea (from bowel prep) and nausea. Negative for blood in stool, constipation, melena and vomiting.  Genitourinary: Negative for dysuria.  All other systems reviewed and are negative.   Family History  Problem Relation Age of Onset  . Heart failure Father        CHF  . Hypertension Father   . Heart disease Father   . Hypertension Paternal Aunt   . Hypertension Paternal Uncle   . Hypertension Paternal Grandmother   . Hypertension Paternal Grandfather   . Coronary artery disease Neg Hx        Premature    Past Medical History:  Diagnosis Date  . Chronic back pain   . GERD (gastroesophageal reflux disease)   . Hypercholesterolemia   . Hyperlipidemia   . Hypertension   . Osteoarthritis of left knee    End-stage  . Palpitations   . PVD (peripheral vascular disease) (HCC)   . Tobacco user     Past Surgical History:  Procedure Laterality Date  . Angiogram-type unspecified  06/13/00   Dr. Hart Rochester  . FEMORAL-POPLITEAL BYPASS GRAFT    . FINGER TENDON REPAIR  1988   Right mallet  . KNEE ARTHROSCOPY  1996   Left  . LUMBAR FUSION  1998   Dr. Jacqulyn Bath  . PATCH ANGIOPLASTY Right 05/31/2018   Procedure: PATCH ANGIOPLASTY RIGHT FEMORAL ARTERY WITH DECRON PATCH;  Surgeon: Maeola Harman, MD;  Location: [facility] OR;  Service: Vascular;  Laterality: Right;  . THROMBECTOMY FEMORAL ARTERY Right 05/31/2018    Procedure: RIGHT FEMORAL THROMBECTOMY;  Surgeon: Maeola Harman, MD;  Location: [facility] OR;  Service: Vascular;  Laterality: Right;    Social History:  reports that she has been smoking cigarettes. She has a 52.00 pack-year smoking history. She has never used smokeless tobacco. She reports that she does not drink alcohol or use drugs.  Allergies: No Known Allergies  Medications Prior to Admission  Medication Sig Dispense Refill  . alum & mag hydroxide-simeth (MAALOX/MYLANTA) 200-200-20 MG/5ML suspension Take 15-30 mLs by mouth every 2 (two) hours as needed for indigestion. 355 mL 0  . amLODipine (NORVASC) 10 MG tablet Take 1 tablet (10 mg total) by mouth daily. 30 tablet 0  . apixaban (ELIQUIS) 5 MG TABS tablet Take 1 tablet (5 mg total) by mouth 2 (two) times daily. 60 tablet   . atorvastatin (LIPITOR) 40 MG tablet Take 1 tablet (40 mg total) by mouth daily. 30 tablet 5  . DULoxetine (CYMBALTA) 60 MG capsule Take 1 capsule (60 mg total) by mouth daily. 30 capsule 5  . fenofibrate 160 MG tablet TAKE 1 TABLET BY MOUTH ONCE DAILY (Patient taking differently: Take 160 mg by mouth daily. ) 90 tablet 1  . lidocaine (LIDODERM) 5 % Place 1 patch onto the skin daily. Remove & Discard patch within 12 hours or as directed by MD 30 patch 0  . metoprolol tartrate (LOPRESSOR) 25 MG tablet Take 0.5 tablets (12.5 mg total) by mouth 2 (two) times daily. (Patient taking differently: Take 12.5 mg by mouth 3 (three) times daily. )    . nicotine (NICODERM CQ - DOSED IN MG/24 HOURS) 21 mg/24hr patch Place 1 patch (21 mg total) onto the skin daily. 28 patch 0  . ondansetron (ZOFRAN) 4 MG tablet Take 4 mg by mouth every 4 (four) hours as needed for nausea or vomiting.    . potassium chloride (K-DUR) 10 MEQ tablet Take 10 mEq by mouth daily.    . pregabalin (LYRICA) 200 MG capsule Take 1 capsule by mouth 3 (three) times daily.    Marland Kitchen senna-docusate (SENOKOT-S) 8.6-50 MG tablet Take 2 tablets by mouth at bedtime as  needed for mild constipation or moderate constipation. (Patient taking differently: Take 1 tablet by mouth daily. )    . tiZANidine (ZANAFLEX) 4 MG tablet Take 1-2 tablets (4-8 mg total) by mouth every 8 (eight) hours as needed for muscle spasms. 90 tablet 1    Prior to Admission medications   Medication Sig Start Date End Date Taking? Authorizing Provider  alum & mag hydroxide-simeth (MAALOX/MYLANTA) 200-200-20 MG/5ML suspension Take 15-30 mLs by mouth every 2 (two) hours as needed for indigestion. 06/08/18  Yes Delano Metz, MD  amLODipine (NORVASC) 10 MG tablet Take 1 tablet (10 mg total) by mouth daily. 06/08/18  Yes Delano Metz, MD  apixaban (ELIQUIS) 5 MG TABS tablet Take 1 tablet (5 mg total) by mouth 2 (two) times daily. 06/08/18  Yes Delano Metz, MD  atorvastatin (LIPITOR) 40 MG tablet Take 1 tablet (40  mg total) by mouth daily. 10/23/17  Yes Martin, Mary-Margaret, [name]  DULoxetine (CYMBALTA) 60 MG capsule Take 1 capsule (60 mg total) by mouth daily. 10/23/17  Yes Martin, Mary-Margaret, [name]  fenofibrate 160 MG tablet TAKE 1 TABLET BY MOUTH ONCE DAILY Patient taking differently: Take 160 mg by mouth daily.  11/25/17  Yes Martin, Mary-Margaret, [name]  lidocaine (LIDODERM) 5 % Place 1 patch onto the skin daily. Remove & Discard patch within 12 hours or as directed by MD 06/08/18  Yes Delano MetzSchertz, Robert, MD  metoprolol tartrate (LOPRESSOR) 25 MG tablet Take 0.5 tablets (12.5 mg total) by mouth 2 (two) times daily. Patient taking differently: Take 12.5 mg by mouth 3 (three) times daily.  06/08/18  Yes Delano MetzSchertz, Robert, MD  nicotine (NICODERM CQ - DOSED IN MG/24 HOURS) 21 mg/24hr patch Place 1 patch (21 mg total) onto the skin daily. 06/08/18  Yes Delano MetzSchertz, Robert, MD  ondansetron (ZOFRAN) 4 MG tablet Take 4 mg by mouth every 4 (four) hours as needed for nausea or vomiting.   Yes [provider]  potassium chloride (K-DUR) 10 MEQ tablet Take 10 mEq by mouth daily.   Yes [provider]   pregabalin (LYRICA) 200 MG capsule Take 1 capsule by mouth 3 (three) times daily. 05/20/18  Yes [provider]  senna-docusate (SENOKOT-S) 8.6-50 MG tablet Take 2 tablets by mouth at bedtime as needed for mild constipation or moderate constipation. Patient taking differently: Take 1 tablet by mouth daily.  06/08/18  Yes Delano MetzSchertz, Robert, MD  tiZANidine (ZANAFLEX) 4 MG tablet Take 1-2 tablets (4-8 mg total) by mouth every 8 (eight) hours as needed for muscle spasms. 09/02/16  Yes Martin, Mary-Margaret, [name]    Blood pressure 130/62, pulse 63, temperature 98 F (36.7 C), temperature source Oral, resp. rate 17, height 5\' 7"  (1.702 m), weight 63 kg, SpO2 96 %. Physical Exam: General: pleasant, frail appearing, elderly white female who is laying in bed in NAD HEENT: head is normocephalic, atraumatic.  Sclera are noninjected.  Pupils equal and round.  Ears and nose without any masses or lesions.  Mouth is pink and moist. Dentition fair Heart: irregular rhythm with regular rate.    Lungs: CTAB, no wheezes, rhonchi, or rales noted.  Respiratory effort nonlabored Abd: Soft, ND, minimal distension of the RLQ that she rates as a 5/10. Very minimal tenderness of the LLQ that she rates as a 3/10 with palpation. No rebound, rigidity or guarding. No evidence of peritonitis. +BS, no masses, hernias, or organomegaly MS: all 4 extremities are symmetrical with no cyanosis, clubbing, or edema. Skin: warm and dry with no masses, lesions, or rashes Psych: A&Ox3 with an appropriate affect. Neuro: cranial nerves grossly intact, extremity CSM intact bilaterally, normal speech  Results for orders placed or performed during the hospital encounter of 07/07/18 (from the past 48 hour(s))  Heparin level (unfractionated)     Status: None   Collection Time: 07/14/18  4:57 AM  Result Value Ref Range   Heparin Unfractionated 0.44 0.30 - 0.70 IU/mL    Comment: (NOTE) If heparin results are below expected values, and  patient dosage has  been confirmed, suggest follow up testing of antithrombin III levels. Performed at [facility], [address] [place], [place] [id number]   CBC     Status: Abnormal   Collection Time: 07/14/18  4:57 AM  Result Value Ref Range   WBC 21.1 (H) 4.0 - 10.5 K/uL   RBC 4.02 3.87 - 5.11 MIL/uL  Hemoglobin 9.9 (L) 12.0 - 15.0 g/dL   HCT 40.9 (L) 81.1 - 91.4 %   MCV 81.8 80.0 - 100.0 fL   MCH 24.6 (L) 26.0 - 34.0 pg   MCHC 30.1 30.0 - 36.0 g/dL   RDW 78.2 (H) 95.6 - 21.3 %   Platelets 476 (H) 150 - 400 K/uL   nRBC 0.3 (H) 0.0 - 0.2 %    Comment: Performed at [facility], [address] [place], [place] [address]  Basic metabolic panel     Status: Abnormal   Collection Time: 07/14/18  4:57 AM  Result Value Ref Range   Sodium 135 135 - 145 mmol/L   Potassium 3.5 3.5 - 5.1 mmol/L   Chloride 102 98 - 111 mmol/L   CO2 23 22 - 32 mmol/L   Glucose, Bld 79 70 - 99 mg/dL   BUN 5 (L) 8 - 23 mg/dL   Creatinine, Ser 8.46 0.44 - 1.00 mg/dL   Calcium 8.1 (L) 8.9 - 10.3 mg/dL   GFR calc non Af Amer >60 >60 mL/min   GFR calc Af Amer >60 >60 mL/min   Anion gap 10 5 - 15    Comment: Performed at [facility], [address] [place], [place] [address]  Heparin level (unfractionated)     Status: None   Collection Time: 07/15/18  4:42 AM  Result Value Ref Range   Heparin Unfractionated 0.61 0.30 - 0.70 IU/mL    Comment: (NOTE) If heparin results are below expected values, and patient dosage has  been confirmed, suggest follow up testing of antithrombin III levels. Performed at [facility], [address], [place], [place] [address]   CBC     Status: Abnormal   Collection Time: 07/15/18  4:42 AM  Result Value Ref Range   WBC 24.6 (H) 4.0 - 10.5 K/uL   RBC 4.37 3.87 - 5.11 MIL/uL   Hemoglobin 10.8 (L) 12.0 - 15.0 g/dL   HCT 24.4 01.0 - 27.2 %   MCV 82.6 80.0 - 100.0 fL   MCH 24.7 (L) 26.0 - 34.0 pg   MCHC 29.9 (L) 30.0 - 36.0 g/dL   RDW 53.6 (H)  64.4 - 15.5 %   Platelets 454 (H) 150 - 400 K/uL   nRBC 0.2 0.0 - 0.2 %    Comment: Performed at [facility], [address] [place], [place] [address]  CBC with Differential/Platelet     Status: Abnormal   Collection Time: 07/15/18  4:42 AM  Result Value Ref Range   WBC 25.0 (H) 4.0 - 10.5 K/uL   RBC 4.38 3.87 - 5.11 MIL/uL   Hemoglobin 10.9 (L) 12.0 - 15.0 g/dL   HCT 25.9 56.3 - 87.5 %   MCV 84.0 80.0 - 100.0 fL   MCH 24.9 (L) 26.0 - 34.0 pg   MCHC 29.6 (L) 30.0 - 36.0 g/dL   RDW 64.3 (H) 32.9 - 51.8 %   Platelets 448 (H) 150 - 400 K/uL   nRBC 0.2 0.0 - 0.2 %   Neutrophils Relative % 88 %   Neutro Abs 21.9 (H) 1.7 - 7.7 K/uL   Lymphocytes Relative 8 %   Lymphs Abs 2.0 0.7 - 4.0 K/uL   Monocytes Relative 3 %   Monocytes Absolute 0.8 0.1 - 1.0 K/uL   Eosinophils Relative 0 %   Eosinophils Absolute 0.0 0.0 - 0.5 K/uL   Basophils Relative 0 %   Basophils Absolute 0.1  0.0 - 0.1 K/uL   Immature Granulocytes 1 %   Abs Immature Granulocytes 0.36 (H) 0.00 - 0.07 K/uL    Comment: Performed at [facility], [address] [place], [place] [address]  Comprehensive metabolic panel     Status: Abnormal   Collection Time: 07/15/18  7:39 AM  Result Value Ref Range   Sodium 136 135 - 145 mmol/L   Potassium 4.3 3.5 - 5.1 mmol/L   Chloride 99 98 - 111 mmol/L   CO2 24 22 - 32 mmol/L   Glucose, Bld 125 (H) 70 - 99 mg/dL   BUN 9 8 - 23 mg/dL   Creatinine, Ser 6.04 0.44 - 1.00 mg/dL   Calcium 8.6 (L) 8.9 - 10.3 mg/dL   Total Protein 6.0 (L) 6.5 - 8.1 g/dL   Albumin 1.8 (L) 3.5 - 5.0 g/dL   AST 28 15 - 41 U/L   ALT 13 0 - 44 U/L   Alkaline Phosphatase 121 38 - 126 U/L   Total Bilirubin 0.7 0.3 - 1.2 mg/dL   GFR calc non Af Amer >60 >60 mL/min   GFR calc Af Amer >60 >60 mL/min   Anion gap 13 5 - 15    Comment: Performed at [facility], [address] [place], [place] [address]   Ct Abdomen Pelvis W Contrast  Result Date: 07/15/2018 CLINICAL DATA:  73 year old  female with history of nausea and inability to eat for the past 6 months. 60 pounds of weight loss. EXAM: CT ABDOMEN AND PELVIS WITH CONTRAST TECHNIQUE: Multidetector CT imaging of the abdomen and pelvis was performed using the standard protocol following bolus administration of intravenous contrast. CONTRAST:  ISOVUE-300 IOPAMIDOL (ISOVUE-300) INJECTION 61% COMPARISON:  CT the abdomen and pelvis 07/09/2018. FINDINGS: Lower chest: Areas of scarring in the inferior segment of the lingula and posterior aspect of the right lower lobe. Aortic atherosclerosis. Calcifications of the mitral annulus. Hepatobiliary: Subcentimeter low-attenuation lesion in segment 4A of the liver, too small to characterize, but statistically likely to represent a tiny cyst. No other suspicious hepatic lesions. No intra or extrahepatic biliary ductal dilatation. Amorphous intermediate attenuation material lying dependently in the gallbladder, new compared to the prior study, favored to reflect vicarious excretion of contrast related to recent CT examination. Pancreas: Low-attenuation lesion in the tail of the pancreas, slightly decreased compared to the prior study, currently measuring 2.4 x 2.8 cm (axial image 26 of series 3), with persistent surrounding inflammatory changes. No other pancreatic mass. No pancreatic ductal dilatation. 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 Adrenals/Urinary Tract: There is a small amount of subcapsular fluid associated with the anterior and lateral aspect of the upper and interpolar region of the left kidney, which appears similar to the prior study and has likely dissected posteriorly from the pancreatic fluid collection mentioned above. Mild multifocal cortical thinning in the right kidney, most severe in the lower pole. 5 mm nonobstructive calculus in the interpolar collecting system of the right kidney. No  hydroureteronephrosis. Urinary bladder is normal in appearance. Bilateral adrenal glands are normal in appearance. Stomach/Bowel: Normal appearance of the stomach. No pathologic dilatation of small bowel or colon. In the region of the descending colon there is again some mural thickening proximally. In the mid to distal descending colon there is discontinuity of the anterior colonic wall with gas extending anteriorly into the adjacent fatty soft tissues (presumably omentum), indicative of  frank perforation. Normal appendix. Vascular/Lymphatic: Aortic atherosclerosis with postoperative changes of aortobifemoral bypass procedure. 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 Superior mesenteric artery appears grossly patent. IMA is not confidently identified. No lymphadenopathy noted in the abdomen or pelvis. Reproductive: Status post hysterectomy. Right ovary is atrophic. 3.4 x 2.5 x 3.1 cm low-attenuation lesion in the left adnexa is likely to represent a cyst. Other: Despite the apparent perforation of the descending colon and gas which appears to be contained within a portion of the omentum in the left side of the abdomen, there is no other definite pneumoperitoneum. No significant volume of ascites. Musculoskeletal: There are no aggressive appearing lytic or blastic lesions noted in the visualized portions of the skeleton. Status post PLIF from L3-L5 with interbody grafts at L3-L4 and L4-L5. IMPRESSION: 1. Compared to the prior examination there is now frank perforation of the descending colon which appears to be predominantly contained within a portion of the omentum in the left side of the abdomen. Surgical consultation is strongly recommended. 2. 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 A  small portion of this collection appears to have dissected posteriorly now involving the subcapsular space associated with the upper and interpolar regions of the left kidney. 3. 5 mm nonobstructive calculus in the interpolar collecting system of the right kidney. 4. Additional incidental findings, as above, similar to the prior examination. Critical Value/emergent results were called by telephone at the time of interpretation on 07/15/2018 at 3:46 pm to Dr. Candelaria Stagers , who verbally acknowledged these results. Electronically Signed   By: Trudie Reed M.D.   On: 07/15/2018 15:47    Anti-infectives (From admission, onward)   Start     Dose/Rate Route Frequency Ordered Stop   07/08/18 2300  vancomycin (VANCOCIN) 1,500 mg in sodium chloride 0.9 % 500 mL IVPB  Status:  Discontinued     1,500 mg 250 mL/hr over 120 Minutes Intravenous  Once 07/07/18 2128 07/07/18 2129   07/08/18 2300  vancomycin (VANCOCIN) 1,500 mg in sodium chloride 0.9 % 500 mL IVPB  Status:  Discontinued     1,500 mg 250 mL/hr over 120 Minutes Intravenous Every 24 hours 07/07/18 2129 07/08/18 0726   07/08/18 0600  ceFEPIme (MAXIPIME) 2 g in sodium chloride 0.9 % 100 mL IVPB  Status:  Discontinued     2 g 200 mL/hr over 30 Minutes Intravenous Every 8 hours 07/07/18 2124 07/13/18 1333   07/07/18 2130  vancomycin (VANCOCIN) 1,500 mg in sodium chloride 0.9 % 500 mL IVPB     1,500 mg 250 mL/hr over 120 Minutes Intravenous  Once 07/07/18 2123 07/08/18 0028   07/07/18 2115  vancomycin (VANCOCIN) IVPB 1000 mg/200 mL premix  Status:  Discontinued     1,000 mg 200 mL/hr over 60 Minutes Intravenous  Once 07/07/18 2110 07/07/18 2128   07/07/18 2115  ceFEPIme (MAXIPIME) 2 g in sodium chloride 0.9 % 100 mL IVPB     2 g 200 mL/hr over 30 Minutes Intravenous  Once 07/07/18 2110 07/07/18 2203       Assessment/Plan A. 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 - Benign abdominal exam. More pain on right side, than left. No peritonitis. No indication for emergent surgey - ID following, IV abx per ID - Recommend IR consultation for drain placement.  - We will continue to follow   ID - none currently, per ID VTE - SCDs, heparin gtt FEN - NPO  Foley - wick Follow up - TBD  [facility] Surgery 4:38 PM 07/15/2018 [phone]

## 2018-07-15 NOTE — Progress Notes (Signed)
Physical Therapy Treatment Patient Details Name: Danielle FilbertLinda S Grant MRN: [id number] DOB: 1946-01-28 Today's Date: 07/15/2018    History of Present Illness  Danielle FilbertLinda S Grant is a 73 y.o. female with medical history significant of chronic back pain, GERD, hyperlipidemia, hypertension, left knee osteoarthritis, palpitations, PVD h/o Aortofem bypass, s/p RLE thromboembolectomy and R common emoral artery patch angioplasty 05/31/18, history of tobacco use who presents to ER due to fall at home.  She just was [facility] from [facility] yesterday.    PT Comments    Session limited by pt's abdominal discomfort, then increasing nausea with time up.    Follow Up Recommendations  Home health PT;Supervision - Intermittent;Supervision for mobility/OOB     Equipment Recommendations  None recommended by PT    Recommendations for Other Services       Precautions / Restrictions Precautions Precautions: Fall    Mobility  Bed Mobility Overal bed mobility: Modified Independent Bed Mobility: Supine to Sit           General bed mobility comments: increased time only, no rail  Transfers Overall transfer level: Needs assistance   Transfers: Sit to/from Stand Sit to Stand: Min guard;Min assist         General transfer comment: no assist needed for standing, but steadying assist once up  Ambulation/Gait Ambulation/Gait assistance: Min assist Gait Distance (Feet): 25 Feet Assistive device: IV Pole Gait Pattern/deviations: Step-through pattern Gait velocity: slow Gait velocity interpretation: <1.31 ft/sec, indicative of household ambulator General Gait Details: short guarded steps,  EHR stayed in the low 90's.  Pt deferred further ambulation due to starting to get nauseated.   Stairs             Wheelchair Mobility    Modified Rankin (Stroke Patients Only)       Balance Overall balance assessment: Needs assistance Sitting-balance support: Feet supported;No upper extremity  supported Sitting balance-Leahy Scale: Good       Standing balance-Leahy Scale: Fair Standing balance comment: mildly unsteady, but can maintain stance without assist.                            Cognition Arousal/Alertness: Awake/alert Behavior During Therapy: WFL for tasks assessed/performed Overall Cognitive Status: Within Functional Limits for tasks assessed                                        Exercises      General Comments        Pertinent Vitals/Pain Pain Assessment: Faces Faces Pain Scale: Hurts little more Pain Location: legs, stomach Pain Descriptors / Indicators: Burning;Discomfort(gassy) Pain Intervention(s): Monitored during session;Limited activity within patient's tolerance    Home Living                      Prior Function            PT Goals (current goals can now be found in the care plan section) Acute Rehab PT Goals Patient Stated Goal: return home with family to assist PT Goal Formulation: With patient Time For Goal Achievement: 07/26/18 Potential to Achieve Goals: Good Progress towards PT goals: Progressing toward goals    Frequency    Min 3X/week      PT Plan Current plan remains appropriate    Co-evaluation  AM-PAC PT "6 Clicks" Mobility   Outcome Measure  Help needed turning from your back to your side while in a flat bed without using bedrails?: None Help needed moving from lying on your back to sitting on the side of a flat bed without using bedrails?: None Help needed moving to and from a bed to a chair (including a wheelchair)?: A Little Help needed standing up from a chair using your arms (e.g., wheelchair or bedside chair)?: A Little Help needed to walk in hospital room?: A Little Help needed climbing 3-5 steps with a railing? : A Lot 6 Click Score: 19    End of Session   Activity Tolerance: Patient limited by fatigue(and abdominal discomfort) Patient left: in  bed;with call bell/phone within reach Nurse Communication: Mobility status PT Visit Diagnosis: Unsteadiness on feet (R26.81);Other abnormalities of gait and mobility (R26.89);Difficulty in walking, not elsewhere classified (R26.2)     Time: 8144-8185 PT Time Calculation (min) (ACUTE ONLY): 18 min  Charges:  $Gait Training: 8-22 mins                     07/15/2018  [place] Bing, PT Acute Rehabilitation Services [phone]  (pager) [phone]  (office)   Eliseo Gum Gagandeep Kossman 07/15/2018, 5:38 PM

## 2018-07-15 NOTE — Progress Notes (Signed)
I spoke with general surgery following the CT scan this afternoon.  There does appear to be perforation of the descending colon.  Currently the plan is for percutaneous drainage by interventional radiology and surgical intervention if this is unsuccessful.  We discussed the obvious concern of the aortobifemoral graft.  This does not appear to be infected at this time.  We will hold off on arteriogram until the above issues have resolved.  [name]

## 2018-07-15 NOTE — Progress Notes (Addendum)
[facility]   Subjective  - No N/V over night.   Objective 137/64 70 98 F (36.7 C) (Oral) (!) 21 94%  Intake/Output Summary (Last 24 hours) at 07/15/2018 0730 Last data filed at 07/15/2018 0400 Gross per 24 hour  Intake 955.55 ml  Output 0 ml  Net 955.55 ml    Comfortable, resting  Lungs non labored breathing Upper Endoscopy Impression:               - Normal esophagus.                           - Severe ulcerative gastritis most consistent with                            ischemia. Biopsied.                           - Normal examined duodenum. Unable to complete lower Endoscopy inadequate bowel prep.   Assessment/Planning: Ischemic gastritis suspected per upper endoscopy Tolerated clear liquids yesterday no N/V Pending angiogram per MD    [name] 07/15/2018 7:30 AM --  Laboratory Lab Results: Recent Labs    07/14/18 0457 07/15/18 0442  WBC 21.1* 24.6*  HGB 9.9* 10.8*  HCT 32.9* 36.1  PLT 476* 454*   BMET Recent Labs    07/14/18 0457  NA 135  K 3.5  CL 102  CO2 23  GLUCOSE 79  BUN 5*  CREATININE 0.58  CALCIUM 8.1*    COAG Lab Results  Component Value Date   INR 1.3 (H) 07/10/2018   INR 1.7 (H) 07/07/2018   INR 1.05 05/17/2009   No results found for: PTT   I agree with the above.  She feels better this am.  Will await bx results, but anticipate angio this week.    WElls [name]

## 2018-07-15 NOTE — Progress Notes (Signed)
Pharmacy Antibiotic Note  [name] is a 73 y.o. female admitted on 07/07/2018 with pneumonia.    Patient now with leukocytosis and intra-abdominal fluid collections. Likely colonic perforation. New orders received from ID service to start meropenem for abdominal perforation.  Tmax 99.7, wbc 25, scr normal at 0.6.   Plan: Meropenem 1g q8 hours  Height: 5\' 7"  (170.2 cm) Weight: 139 lb (63 kg) IBW/kg (Calculated) : 61.6  Temp (24hrs), Avg:97.9 F (36.6 C), Min:97.8 F (36.6 C), Max:98 F (36.7 C)  Recent Labs  Lab 07/09/18 0536  07/11/18 0446 07/11/18 1949 07/12/18 0356 07/13/18 0401 07/14/18 0457 07/15/18 0442 07/15/18 0739  WBC 23.2*   < > 19.9* 29.1* 23.7* 19.8* 21.1* 25.0*  24.6*  --   CREATININE 0.51   < > 0.48 0.62 0.72  --  0.58  --  0.58  LATICACIDVEN 1.1  --  1.0  --   --   --   --   --   --    < > = values in this interval not displayed.    Estimated Creatinine Clearance: 62.7 mL/min (by C-G formula based on SCr of 0.58 mg/dL).    No Known Allergies  Antimicrobials this admission: Cefepime 4/7 >> 4/13 Vanco 4/7 >> 4/8 Meropenem 4/15>   Microbiology results: 4/7 BCx: ng  4/8 mrsa pcr negative    Thank you for allowing pharmacy to be a part of this patient's care.  Sheppard Coil PharmD., BCPS Clinical Pharmacist 07/15/2018 4:18 PM

## 2018-07-15 NOTE — Anesthesia Postprocedure Evaluation (Signed)
Anesthesia Post Note  Patient: [name]  Procedure(s) Performed: ESOPHAGOGASTRODUODENOSCOPY (EGD) (N/A ) FLEXIBLE SIGMOIDOSCOPY (N/A ) BIOPSY     Patient location during evaluation: PACU Anesthesia Type: General Level of consciousness: awake and alert Pain management: pain level controlled Vital Signs Assessment: post-procedure vital signs reviewed and stable Respiratory status: spontaneous breathing, nonlabored ventilation, respiratory function stable and patient connected to nasal cannula oxygen Cardiovascular status: blood pressure returned to baseline and stable Postop Assessment: no apparent nausea or vomiting Anesthetic complications: no    Last Vitals:  Vitals:   07/14/18 2128 07/15/18 0515  BP: 137/64   Pulse: 70 70  Resp: 16 (!) 21  Temp:    SpO2: 90% 94%    Last Pain:  Vitals:   07/14/18 2128  TempSrc:   PainSc: 8                  Maanvi Lecompte S

## 2018-07-16 ENCOUNTER — Inpatient Hospital Stay (HOSPITAL_COMMUNITY): Payer: Medicare Other | Admitting: Certified Registered Nurse Anesthetist

## 2018-07-16 ENCOUNTER — Encounter (HOSPITAL_COMMUNITY)
Admission: EM | Disposition: A | Discharge status: Hospice | Payer: Self-pay | Source: Home / Self Care | Attending: Internal Medicine

## 2018-07-16 ENCOUNTER — Inpatient Hospital Stay (HOSPITAL_COMMUNITY): Payer: Medicare Other

## 2018-07-16 ENCOUNTER — Encounter (HOSPITAL_COMMUNITY): Payer: Self-pay | Admitting: Certified Registered Nurse Anesthetist

## 2018-07-16 DIAGNOSIS — F17200 Nicotine dependence, unspecified, uncomplicated: Secondary | ICD-10-CM

## 2018-07-16 DIAGNOSIS — K631 Perforation of intestine (nontraumatic): Secondary | ICD-10-CM

## 2018-07-16 HISTORY — PX: COLON RESECTION: SHX5231

## 2018-07-16 LAB — BASIC METABOLIC PANEL
Anion gap: 9 (ref 5–15)
BUN: 11 mg/dL (ref 8–23)
CO2: 25 mmol/L (ref 22–32)
Calcium: 8.5 mg/dL — ABNORMAL LOW (ref 8.9–10.3)
Chloride: 104 mmol/L (ref 98–111)
Creatinine, Ser: 0.56 mg/dL (ref 0.44–1.00)
GFR calc Af Amer: >60 mL/min (ref >60)
GFR calc non Af Amer: >60 mL/min (ref >60)
Glucose, Bld: 118 mg/dL — ABNORMAL HIGH (ref 70–99)
Potassium: 4.3 mmol/L (ref 3.5–5.1)
Sodium: 138 mmol/L (ref 135–145)

## 2018-07-16 LAB — CBC
HCT: 31.1 % — ABNORMAL LOW (ref 36.0–46.0)
Hemoglobin: 9.8 g/dL — ABNORMAL LOW (ref 12.0–15.0)
MCH: 25.8 pg — ABNORMAL LOW (ref 26.0–34.0)
MCHC: 31.5 g/dL (ref 30.0–36.0)
MCV: 81.8 fL (ref 80.0–100.0)
Platelets: 460 10*3/uL — ABNORMAL HIGH (ref 150–400)
RBC: 3.8 MIL/uL — ABNORMAL LOW (ref 3.87–5.11)
RDW: 18.5 % — ABNORMAL HIGH (ref 11.5–15.5)
WBC: 21.8 10*3/uL — ABNORMAL HIGH (ref 4.0–10.5)
nRBC: 0.4 % — ABNORMAL HIGH (ref 0.0–0.2)

## 2018-07-16 LAB — HEPARIN LEVEL (UNFRACTIONATED): Heparin Unfractionated: 0.74 IU/mL — ABNORMAL HIGH (ref 0.30–0.70)

## 2018-07-16 LAB — TYPE AND SCREEN
ABO/RH(D): A NEG
Antibody Screen: NEGATIVE

## 2018-07-16 LAB — GLUCOSE, CAPILLARY: Glucose-Capillary: 98 mg/dL (ref 70–99)

## 2018-07-16 LAB — ABO/RH: ABO/RH(D): A NEG

## 2018-07-16 SURGERY — COLON RESECTION
Anesthesia: General | Site: Abdomen

## 2018-07-16 MED ORDER — 0.9 % SODIUM CHLORIDE (POUR BTL) OPTIME
TOPICAL | Status: DC | PRN
  Administered 2018-07-16: 1000 mL

## 2018-07-16 MED ORDER — SUCCINYLCHOLINE CHLORIDE 200 MG/10ML IV SOSY
PREFILLED_SYRINGE | INTRAVENOUS | Status: DC | PRN
  Administered 2018-07-16: 120 mg via INTRAVENOUS

## 2018-07-16 MED ORDER — LACTATED RINGERS IV SOLN: INTRAVENOUS | Status: DC

## 2018-07-16 MED ORDER — DIPHENHYDRAMINE HCL 50 MG/ML IJ SOLN: 12.5000 mg | Freq: Four times a day (QID) | INTRAMUSCULAR | Status: DC | PRN

## 2018-07-16 MED ORDER — PROPOFOL 10 MG/ML IV BOLUS
INTRAVENOUS | Status: AC
  Filled 2018-07-16: qty 20

## 2018-07-16 MED ORDER — HEPARIN (PORCINE) 25000 UT/250ML-% IV SOLN: 1350.0000 [IU]/h | INTRAVENOUS | Status: DC

## 2018-07-16 MED ORDER — MIDAZOLAM HCL 2 MG/2ML IJ SOLN
INTRAMUSCULAR | Status: DC | PRN
  Administered 2018-07-16: 2 mg via INTRAVENOUS

## 2018-07-16 MED ORDER — FENTANYL CITRATE (PF) 250 MCG/5ML IJ SOLN
INTRAMUSCULAR | Status: AC
  Filled 2018-07-16: qty 5

## 2018-07-16 MED ORDER — DEXAMETHASONE SODIUM PHOSPHATE 10 MG/ML IJ SOLN
INTRAMUSCULAR | Status: DC | PRN
  Administered 2018-07-16: 5 mg via INTRAVENOUS

## 2018-07-16 MED ORDER — ROCURONIUM BROMIDE 50 MG/5ML IV SOSY
PREFILLED_SYRINGE | INTRAVENOUS | Status: AC
  Filled 2018-07-16: qty 5

## 2018-07-16 MED ORDER — FENTANYL CITRATE (PF) 100 MCG/2ML IJ SOLN
INTRAMUSCULAR | Status: AC | PRN
  Administered 2018-07-16: 50 ug via INTRAVENOUS
  Administered 2018-07-16: 25 ug via INTRAVENOUS

## 2018-07-16 MED ORDER — ROCURONIUM BROMIDE 10 MG/ML (PF) SYRINGE
PREFILLED_SYRINGE | INTRAVENOUS | Status: DC | PRN
  Administered 2018-07-16: 20 mg via INTRAVENOUS

## 2018-07-16 MED ORDER — ACETAMINOPHEN 325 MG PO TABS: 325.0000 mg | ORAL_TABLET | Freq: Once | ORAL | Status: DC

## 2018-07-16 MED ORDER — ONDANSETRON HCL 4 MG/2ML IJ SOLN
INTRAMUSCULAR | Status: AC
  Filled 2018-07-16: qty 2

## 2018-07-16 MED ORDER — LACTATED RINGERS IV SOLN
INTRAVENOUS | Status: DC
  Administered 2018-07-16 (×2): via INTRAVENOUS

## 2018-07-16 MED ORDER — SODIUM CHLORIDE 0.9% FLUSH: 9.0000 mL | INTRAVENOUS | Status: DC | PRN

## 2018-07-16 MED ORDER — SUCCINYLCHOLINE CHLORIDE 200 MG/10ML IV SOSY
PREFILLED_SYRINGE | INTRAVENOUS | Status: AC
  Filled 2018-07-16: qty 10

## 2018-07-16 MED ORDER — ACETAMINOPHEN 10 MG/ML IV SOLN: 1000.0000 mg | Freq: Once | INTRAVENOUS | Status: DC | PRN

## 2018-07-16 MED ORDER — OXYCODONE HCL 5 MG PO TABS
5.0000 mg | ORAL_TABLET | ORAL | Status: DC | PRN
  Administered 2018-07-18 – 2018-07-22 (×4): 10 mg via ORAL
  Administered 2018-07-23 – 2018-07-25 (×4): 5 mg via ORAL
  Administered 2018-07-25 – 2018-07-26 (×2): 10 mg via ORAL
  Administered 2018-07-27: 21:00:00 5 mg via ORAL
  Administered 2018-07-27 – 2018-08-07 (×10): 10 mg via ORAL
  Filled 2018-07-16: qty 2
  Filled 2018-07-16 (×2): qty 1
  Filled 2018-07-16 (×2): qty 2
  Filled 2018-07-16 (×2): qty 1
  Filled 2018-07-16 (×5): qty 2
  Filled 2018-07-16: qty 1
  Filled 2018-07-16 (×3): qty 2
  Filled 2018-07-16: qty 1
  Filled 2018-07-16 (×5): qty 2

## 2018-07-16 MED ORDER — NALOXONE HCL 0.4 MG/ML IJ SOLN: 0.4000 mg | INTRAMUSCULAR | Status: DC | PRN

## 2018-07-16 MED ORDER — HEPARIN (PORCINE) 25000 UT/250ML-% IV SOLN
1450.0000 [IU]/h | INTRAVENOUS | Status: DC
  Administered 2018-07-17 – 2018-07-19 (×4): 1350 [IU]/h via INTRAVENOUS
  Administered 2018-07-20 – 2018-07-21 (×3): 1500 [IU]/h via INTRAVENOUS
  Administered 2018-07-23 (×2): 1550 [IU]/h via INTRAVENOUS
  Administered 2018-07-24: 16:00:00 1350 [IU]/h via INTRAVENOUS
  Administered 2018-07-25 – 2018-07-29 (×5): 1200 [IU]/h via INTRAVENOUS
  Administered 2018-07-30: 03:00:00 1400 [IU]/h via INTRAVENOUS
  Filled 2018-07-16 (×18): qty 250

## 2018-07-16 MED ORDER — MIDAZOLAM HCL 2 MG/2ML IJ SOLN
INTRAMUSCULAR | Status: AC
  Filled 2018-07-16: qty 2

## 2018-07-16 MED ORDER — SODIUM CHLORIDE 0.9 % IV SOLN
INTRAVENOUS | Status: DC
  Administered 2018-07-16 – 2018-07-17 (×3): via INTRAVENOUS

## 2018-07-16 MED ORDER — DIPHENHYDRAMINE HCL 12.5 MG/5ML PO ELIX
12.5000 mg | ORAL_SOLUTION | Freq: Four times a day (QID) | ORAL | Status: DC | PRN
  Filled 2018-07-16: qty 5

## 2018-07-16 MED ORDER — DEXAMETHASONE SODIUM PHOSPHATE 10 MG/ML IJ SOLN
INTRAMUSCULAR | Status: AC
  Filled 2018-07-16: qty 1

## 2018-07-16 MED ORDER — PHENYLEPHRINE 40 MCG/ML (10ML) SYRINGE FOR IV PUSH (FOR BLOOD PRESSURE SUPPORT)
PREFILLED_SYRINGE | INTRAVENOUS | Status: AC
  Filled 2018-07-16: qty 10

## 2018-07-16 MED ORDER — LIDOCAINE 2% (20 MG/ML) 5 ML SYRINGE
INTRAMUSCULAR | Status: AC
  Filled 2018-07-16: qty 5

## 2018-07-16 MED ORDER — ACETAMINOPHEN 160 MG/5ML PO SOLN: 325.0000 mg | Freq: Once | ORAL | Status: DC

## 2018-07-16 MED ORDER — LIDOCAINE 2% (20 MG/ML) 5 ML SYRINGE
INTRAMUSCULAR | Status: DC | PRN
  Administered 2018-07-16: 50 mg via INTRAVENOUS

## 2018-07-16 MED ORDER — ROCURONIUM BROMIDE 50 MG/5ML IV SOSY
PREFILLED_SYRINGE | INTRAVENOUS | Status: AC
  Filled 2018-07-16: qty 10

## 2018-07-16 MED ORDER — MORPHINE SULFATE 2 MG/ML IV SOLN
INTRAVENOUS | Status: DC
  Administered 2018-07-16: 21:00:00 via INTRAVENOUS
  Filled 2018-07-16: qty 60

## 2018-07-16 MED ORDER — METHOCARBAMOL 1000 MG/10ML IJ SOLN
500.0000 mg | Freq: Four times a day (QID) | INTRAVENOUS | Status: DC | PRN
  Filled 2018-07-16: qty 5

## 2018-07-16 MED ORDER — FENTANYL CITRATE (PF) 100 MCG/2ML IJ SOLN
INTRAMUSCULAR | Status: AC
  Filled 2018-07-16: qty 2

## 2018-07-16 MED ORDER — HYDROMORPHONE HCL 1 MG/ML IJ SOLN
0.2500 mg | INTRAMUSCULAR | Status: DC | PRN
  Administered 2018-07-16: 18:00:00 0.25 mg via INTRAVENOUS

## 2018-07-16 MED ORDER — MEPERIDINE HCL 50 MG/ML IJ SOLN: 6.2500 mg | INTRAMUSCULAR | Status: DC | PRN

## 2018-07-16 MED ORDER — ONDANSETRON HCL 4 MG/2ML IJ SOLN
INTRAMUSCULAR | Status: DC | PRN
  Administered 2018-07-16: 4 mg via INTRAVENOUS

## 2018-07-16 MED ORDER — SUGAMMADEX SODIUM 200 MG/2ML IV SOLN
INTRAVENOUS | Status: DC | PRN
  Administered 2018-07-16: 124.8 mg via INTRAVENOUS

## 2018-07-16 MED ORDER — EPHEDRINE 5 MG/ML INJ
INTRAVENOUS | Status: AC
  Filled 2018-07-16: qty 10

## 2018-07-16 MED ORDER — FENTANYL CITRATE (PF) 250 MCG/5ML IJ SOLN
INTRAMUSCULAR | Status: DC | PRN
  Administered 2018-07-16: 50 ug via INTRAVENOUS
  Administered 2018-07-16: 100 ug via INTRAVENOUS

## 2018-07-16 MED ORDER — EPHEDRINE SULFATE-NACL 50-0.9 MG/10ML-% IV SOSY
PREFILLED_SYRINGE | INTRAVENOUS | Status: DC | PRN
  Administered 2018-07-16: 10 mg via INTRAVENOUS

## 2018-07-16 MED ORDER — PROPOFOL 10 MG/ML IV BOLUS
INTRAVENOUS | Status: DC | PRN
  Administered 2018-07-16: 30 mg via INTRAVENOUS
  Administered 2018-07-16: 80 mg via INTRAVENOUS

## 2018-07-16 MED ORDER — PHENYLEPHRINE 40 MCG/ML (10ML) SYRINGE FOR IV PUSH (FOR BLOOD PRESSURE SUPPORT)
PREFILLED_SYRINGE | INTRAVENOUS | Status: DC | PRN
  Administered 2018-07-16: 120 ug via INTRAVENOUS
  Administered 2018-07-16: 80 ug via INTRAVENOUS

## 2018-07-16 MED ORDER — MIDAZOLAM HCL 2 MG/2ML IJ SOLN
INTRAMUSCULAR | Status: AC | PRN
  Administered 2018-07-16: 0.5 mg via INTRAVENOUS
  Administered 2018-07-16: 1 mg via INTRAVENOUS
  Administered 2018-07-16: 0.5 mg via INTRAVENOUS

## 2018-07-16 MED ORDER — PROMETHAZINE HCL 25 MG/ML IJ SOLN: 6.2500 mg | INTRAMUSCULAR | Status: DC | PRN

## 2018-07-16 MED ORDER — HYDROMORPHONE HCL 1 MG/ML IJ SOLN
INTRAMUSCULAR | Status: AC
  Filled 2018-07-16: qty 1

## 2018-07-16 MED ORDER — SODIUM CHLORIDE 0.9 % IV SOLN
INTRAVENOUS | Status: DC | PRN
  Administered 2018-07-16: 25 ug/min via INTRAVENOUS

## 2018-07-16 SURGICAL SUPPLY — 57 items
BLADE CLIPPER SURG (BLADE) IMPLANT
BNDG GAUZE ELAST 4 BULKY (GAUZE/BANDAGES/DRESSINGS) ×2 IMPLANT
CANISTER SUCT 3000ML PPV (MISCELLANEOUS) ×3 IMPLANT
COVER SURGICAL LIGHT HANDLE (MISCELLANEOUS) ×4 IMPLANT
COVER WAND RF STERILE (DRAPES) ×1 IMPLANT
DRAIN CHANNEL 19F RND (DRAIN) ×4 IMPLANT
DRSG OPSITE POSTOP 4X10 (GAUZE/BANDAGES/DRESSINGS) IMPLANT
DRSG OPSITE POSTOP 4X8 (GAUZE/BANDAGES/DRESSINGS) IMPLANT
ELECT BLADE 6.5 EXT (BLADE) ×2 IMPLANT
ELECT CAUTERY BLADE 6.4 (BLADE) ×3 IMPLANT
ELECT REM PT RETURN 9FT ADLT (ELECTROSURGICAL) ×3
ELECTRODE REM PT RTRN 9FT ADLT (ELECTROSURGICAL) ×1 IMPLANT
EVACUATOR SILICONE 100CC (DRAIN) ×4 IMPLANT
GAUZE SPONGE 4X4 12PLY STRL (GAUZE/BANDAGES/DRESSINGS) ×2 IMPLANT
GAUZE SPONGE 4X4 12PLY STRL LF (GAUZE/BANDAGES/DRESSINGS) ×4 IMPLANT
GLOVE BIO SURGEON STRL SZ7.5 (GLOVE) ×6 IMPLANT
GLOVE BIOGEL PI IND STRL 6 (GLOVE) IMPLANT
GLOVE BIOGEL PI IND STRL 6.5 (GLOVE) IMPLANT
GLOVE BIOGEL PI IND STRL 8 (GLOVE) ×2 IMPLANT
GLOVE BIOGEL PI INDICATOR 6 (GLOVE) ×2
GLOVE BIOGEL PI INDICATOR 6.5 (GLOVE) ×4
GLOVE BIOGEL PI INDICATOR 8 (GLOVE) ×4
GOWN STRL REUS W/ TWL LRG LVL3 (GOWN DISPOSABLE) ×4 IMPLANT
GOWN STRL REUS W/ TWL XL LVL3 (GOWN DISPOSABLE) ×2 IMPLANT
GOWN STRL REUS W/TWL LRG LVL3 (GOWN DISPOSABLE) ×12
GOWN STRL REUS W/TWL XL LVL3 (GOWN DISPOSABLE) ×6
HANDLE SUCTION POOLE (INSTRUMENTS) IMPLANT
KIT OSTOMY DRAINABLE 2.75 STR (WOUND CARE) ×2 IMPLANT
KIT TURNOVER KIT B (KITS) ×3 IMPLANT
LIGASURE IMPACT 36 18CM CVD LR (INSTRUMENTS) ×2 IMPLANT
NS IRRIG 1000ML POUR BTL (IV SOLUTION) ×6 IMPLANT
PACK COLON (CUSTOM PROCEDURE TRAY) ×3 IMPLANT
PAD ABD 8X10 STRL (GAUZE/BANDAGES/DRESSINGS) ×2 IMPLANT
PAD ARMBOARD 7.5X6 YLW CONV (MISCELLANEOUS) ×3 IMPLANT
PENCIL SMOKE EVACUATOR (MISCELLANEOUS) ×3 IMPLANT
SLEEVE SUCTION CATH 165 (SLEEVE) ×2 IMPLANT
SPONGE LAP 18X18 RF (DISPOSABLE) ×4 IMPLANT
STAPLER CUT CVD 40MM BLUE (STAPLE) ×2 IMPLANT
STAPLER PROXIMATE 75MM BLUE (STAPLE) ×2 IMPLANT
STAPLER VISISTAT 35W (STAPLE) ×3 IMPLANT
SUCTION POOLE HANDLE (INSTRUMENTS) ×3
SURGILUBE 2OZ TUBE FLIPTOP (MISCELLANEOUS) IMPLANT
SUT ETHILON 2 0 FS 18 (SUTURE) ×4 IMPLANT
SUT PDS AB 1 TP1 96 (SUTURE) ×4 IMPLANT
SUT PROLENE 2 0 CT2 30 (SUTURE) IMPLANT
SUT PROLENE 2 0 KS (SUTURE) IMPLANT
SUT PROLENE 2 0 SH DA (SUTURE) ×2 IMPLANT
SUT SILK 2 0 SH CR/8 (SUTURE) ×3 IMPLANT
SUT SILK 2 0 TIES 10X30 (SUTURE) ×3 IMPLANT
SUT SILK 3 0 SH CR/8 (SUTURE) ×3 IMPLANT
SUT SILK 3 0 TIES 10X30 (SUTURE) ×3 IMPLANT
SUT VIC AB 3-0 SH 18 (SUTURE) ×2 IMPLANT
TAPE CLOTH SURG 4X10 WHT LF (GAUZE/BANDAGES/DRESSINGS) ×2 IMPLANT
TRAY FOLEY MTR SLVR 16FR STAT (SET/KITS/TRAYS/PACK) ×3 IMPLANT
TRAY PROCTOSCOPIC FIBER OPTIC (SET/KITS/TRAYS/PACK) IMPLANT
TUBE CONNECTING 12'X1/4 (SUCTIONS) ×2
TUBE CONNECTING 12X1/4 (SUCTIONS) ×4 IMPLANT

## 2018-07-16 NOTE — Progress Notes (Signed)
Pt with what appears to a large left colon defect per IR on drain placement. Will plan on taking her to the OR for ex lap. I don't think this will heal with out surgical intervention. I d/w her the need for L colectomy and and ostomy I d/w her the risks and benefits of the procedure to include but not limited to: infection, bleeding, damage to surrounding structure, possible prolonged intubation, possible need for more surgery.  Pt voices understanding and wishes to proceed.

## 2018-07-16 NOTE — Progress Notes (Signed)
Received patient from IR.  Patient responds appropriately to voice, denies pain at this time.   Placed on cardiac monitor.  O2 Sat low, 84-86 %, placed on 2 l [place]. Gauze dressing to right lower quadrant of abdomen, CDI, with drain tube to leg bag, small amount of light brown drainage noted in tubing, bag is empty.  Abdomen soft with BS x 4 quadrants.  Patient is resting in bed in no apparent distress.

## 2018-07-16 NOTE — Progress Notes (Signed)
[name] transported to IR .

## 2018-07-16 NOTE — Progress Notes (Signed)
Attempted to contact Mrs Goren's daughter.  Left message on phone.  Received a call back from [name], daughter.  Update given with the days events and that Mrs. Whitcher is currently in surgery.  Verbalized understanding.  Left nurse contact information with family.

## 2018-07-16 NOTE — Anesthesia Procedure Notes (Signed)
Procedure Name: Intubation Date/Time: 07/16/2018 2:10 PM Performed by: Mayer Camel, CRNA Pre-anesthesia Checklist: Patient identified, Emergency Drugs available, Suction available and Patient being monitored Patient Re-evaluated:Patient Re-evaluated prior to induction Oxygen Delivery Method: Circle System Utilized Preoxygenation: Pre-oxygenation with 100% oxygen Induction Type: IV induction and Rapid sequence Ventilation: Mask ventilation without difficulty Laryngoscope Size: Miller and 2 Grade View: Grade I Tube type: Oral Tube size: 7.5 mm Number of attempts: 1 Airway Equipment and Method: Stylet and Oral airway Placement Confirmation: ETT inserted through vocal cords under direct vision,  positive ETCO2 and breath sounds checked- equal and bilateral Secured at: 21 cm Tube secured with: Tape Dental Injury: Teeth and Oropharynx as per pre-operative assessment

## 2018-07-16 NOTE — Procedures (Signed)
Interventional Radiology Procedure:   Indications: Colonic perforation and will try to manage with percutaneous drainage catheter  Procedure: CT guided drain placement in intra-abdominal extraluminal gas collection  Findings: 12 Fr drain placed in gas collection that communicates with descending colon.  Wire and drain preferentially went into colon and drain was positioned just outside of colon lumen.  No fluid coming out of drain at time of placement.  Complications: None     EBL: Less than 10 ml  Plan: Follow output. Anticipate minimal output and any output will likely be stool.   Discussed with Mattie Marlin, PA-C.     Evia Goldsmith R. Lowella Dandy, MD  Pager: [phone]

## 2018-07-16 NOTE — Anesthesia Preprocedure Evaluation (Addendum)
Anesthesia Evaluation  Patient identified by MRN, date of birth, ID band Patient awake    Reviewed: Allergy & Precautions, NPO status , Patient's Chart, lab work & pertinent test results  Airway Mallampati: I  TM Distance: >3 FB Neck ROM: Full    Dental  (+) Edentulous Upper, Edentulous Lower   Pulmonary COPD, Current Smoker,    breath sounds clear to auscultation       Cardiovascular hypertension, Pt. on home beta blockers and Pt. on medications + Peripheral Vascular Disease   Rhythm:Regular Rate:Normal     Neuro/Psych Anxiety Depression    GI/Hepatic Neg liver ROS, GERD  ,  Endo/Other  negative endocrine ROS  Renal/GU negative Renal ROS     Musculoskeletal  (+) Arthritis ,   Abdominal (+)  Abdomen: tender.    Peds  Hematology negative hematology ROS (+)   Anesthesia Other Findings   Reproductive/Obstetrics                            Lab Results  Component Value Date   WBC 21.8 (H) 07/16/2018   HGB 9.8 (L) 07/16/2018   HCT 31.1 (L) 07/16/2018   MCV 81.8 07/16/2018   PLT 460 (H) 07/16/2018   Lab Results  Component Value Date   CREATININE 0.56 07/16/2018   BUN 11 07/16/2018   NA 138 07/16/2018   K 4.3 07/16/2018   CL 104 07/16/2018   CO2 25 07/16/2018   Lab Results  Component Value Date   INR 1.3 (H) 07/10/2018   INR 1.7 (H) 07/07/2018   INR 1.05 05/17/2009   Echo: 2. The left ventricle has low normal systolic function, with an ejection fraction of 50-55%. The cavity size was normal. Left ventricular diastolic Doppler parameters are indeterminate in the setting of atrial fibrillation.  3. The right ventricle has normal systolic function. The cavity was normal. There is no increase in right ventricular wall thickness. Right ventricular systolic pressure could not be assessed.  4. The mitral valve is degenerative. Mild thickening of the mitral valve leaflet. Mild  calcification of the mitral valve leaflet. Trivial mitral regurgitation.  5. The tricuspid valve is grossly normal.  6. The aortic valve is tricuspid. Mild thickening of the aortic valve.  7. The aortic root is normal in size and structure.  8. The inferior vena cava was normal in size with <50% respiratory variability.  9. The interatrial septum was not assessed.  Anesthesia Physical Anesthesia Plan  ASA: III and emergent  Anesthesia Plan: General   Post-op Pain Management:    Induction: Intravenous and Rapid sequence  PONV Risk Score and Plan: 3 and Ondansetron, Dexamethasone and Treatment may vary due to age or medical condition  Airway Management Planned: Oral ETT  Additional Equipment: None  Intra-op Plan:   Post-operative Plan: Extubation in OR  Informed Consent: I have reviewed the patients History and Physical, chart, labs and discussed the procedure including the risks, benefits and alternatives for the proposed anesthesia with the patient or authorized representative who has indicated his/her understanding and acceptance.     Dental advisory given  Plan Discussed with: CRNA  Anesthesia Plan Comments: (Possible arterial line. )       Anesthesia Quick Evaluation

## 2018-07-16 NOTE — Transfer of Care (Signed)
Immediate Anesthesia Transfer of Care Note  Patient: [name]  Procedure(s) Performed: EXPLORATORY LAPAROTOMY, LEFT SIGMOID COLECTOMY,, SPLENIC FLEXOR,COLOSTOMY (N/A Abdomen)  Patient Location: PACU  Anesthesia Type:General  Level of Consciousness: drowsy  Airway & Oxygen Therapy: Patient Spontanous Breathing and Patient connected to face mask oxygen  Post-op Assessment: Report given to RN and Post -op Vital signs reviewed and stable  Post vital signs: Reviewed and stable  Last Vitals:  Vitals Value Taken Time  BP 117/61 07/16/2018  4:25 PM  Temp    Pulse 72 07/16/2018  4:31 PM  Resp 22 07/16/2018  4:31 PM  SpO2 97 % 07/16/2018  4:31 PM  Vitals shown include unvalidated device data.  Last Pain:  Vitals:   07/16/18 1331  TempSrc:   PainSc: 0-No pain      Patients Stated Pain Goal: 0 (07/14/18 2128)  Complications: No apparent anesthesia complications

## 2018-07-16 NOTE — Op Note (Signed)
07/16/2018  4:04 PM  PATIENT:  [name]  73 y.o. female  PRE-OPERATIVE DIAGNOSIS:  perforated descending colon  POST-OPERATIVE DIAGNOSIS:  perforated descending colon  PROCEDURE:  Procedure(s): EXPLORATORY LAPAROTOMY, lysis of adhesions X 45 minutes LEFT SIGMOID COLECTOMY, SPLENIC FLEXOR MOBILIZATION,DESCENDING COLOSTOMY (N/A)  SURGEON:  Surgeon(s) and Role:    * Axel Filleramirez, Heylee Tant, MD - Primary    Emelia Loron* Wakefield, Matthew, MD - Assisting   ANESTHESIA:   general  EBL:  100 mL   BLOOD ADMINISTERED:NONE  DRAINS: ([id number]) Jackson-Pratt drain(s) with closed bulb suction in the PELVIS AND LEFT UPPER QUADRANT   LOCAL MEDICATIONS USED:  NONE  SPECIMEN:  Source of Specimen: Left colon  DISPOSITION OF SPECIMEN:  PATHOLOGY  COUNTS:  YES  TOURNIQUET:  * No tourniquets in log *  DICTATION: .Dragon Dictation Indication procedure: Patient is a 73 year old female who we are consulted on secondary to left colon perforation, likely due to ischemia.  Secondary to IR findings and likely nonresolution of colonic perforation we decided proceed to the operating urgently for exploratory laparotomy.  Details of procedure: After the patient was consented she was taken back to the operating room placed in supine position with bilateral SCDs in place.  She underwent general trach intubation.  Patient then had a Foley catheter placed.  Patient was then prepped and draped in standard fashion.  Timeout was called and all facts verified.  At this time the midline incision was made using a #10 blade.  There was some omental adhesions as well as small bowel adhesions to the midline fascia.  These were taken down both with cautery and with sharp dissection.  Once these adhesions were freed up.  The fascia incision was extended inferiorly and superiorly.  At this time were able to lyse further adhesions laterally both right and left.  Lysis of adhesions was approximately 45 minutes.  At this time I was able  to mobilize the omentum that was adherent to the left colon.  Upon doing this was easily see that there was a perforation of the colon.  There was spillage of liquid stool into the abdominal cavity.  At this time we proceeded to mobilize the left colon both from a proximal distal direction.  The sigmoid colon was dissected as a white line of Toldt was incised.  This was dissected down to the pelvis.  The extent of dissection was to the sacral promontory.  At this time a mesenteric window was made.  A contour blue load stapler was used to transect the colon.  The mesentery was then ligated using LigaSure device.  We continued this dissection proximally.  This was done easily in manually with blunt dissection.  This was likely due to the ischemia of the left colon.  This was taken up to the splenic flexure.  At this time were able to mobilize from proximal and distal direction.  The omentum was ligated and sent to pathology in this area.  At this time an area of the transverse colon was chosen for transection.  A mesenteric window was made.  A 75 GIA stapler was then used to transect the colon.  At this time we were able to retract the colon medially.  Splenic flexure was mobilized completely.  At this time the specimen was sent off to pathology.  At this time the abdominal cavity was irrigated out with sterile saline.  All bleeders were suture-ligated at the mesentery.  At this time 2-0 Prolene's were then used to tack  the staple line of the rectum.  19 [place] Blake drains were then brought out his 2 stab incisions in the left lower quadrant.  Wound was placed into the pelvis and a second was placed into the left upper quadrant, paracolic gutter area.  At this time the skin was elevated in the right upper quadrant for the colostomy site.  The subcutaneous fat was removed.  A cruciate ligament was made over the anterior and posterior fascia the rectus muscle.  The colostomy was brought up through the colostomy  site.  This time the midline fascia was reapproximated using #1 PDS in a standard running fashion x2.  The midline wound was left open, packed with saline soaked Kerlix.  They colostomy staple line was transected using cautery.  The colostomy was matured using 3 oh silks in interrupted fashion.  Colostomy appliance was placed.  The patient taught the procedure well was taken to the recovery in stable condition.  PLAN OF CARE: Admit to inpatient   PATIENT DISPOSITION:  PACU - hemodynamically stable.   Delay start of Pharmacological VTE agent (>24hrs) due to surgical blood loss or risk of bleeding: no

## 2018-07-16 NOTE — Sedation Documentation (Signed)
Pt breathing through mouth etco2 not reading properly

## 2018-07-16 NOTE — Plan of Care (Signed)
Care plans reviewed and patient is progressing.  

## 2018-07-16 NOTE — Anesthesia Postprocedure Evaluation (Signed)
Anesthesia Post Note  Patient: [name]  Procedure(s) Performed: EXPLORATORY LAPAROTOMY, LEFT SIGMOID COLECTOMY,, SPLENIC FLEXOR, COLOSTOMY (N/A Abdomen)     Patient location during evaluation: PACU Anesthesia Type: General Level of consciousness: awake and alert Pain management: pain level controlled Vital Signs Assessment: post-procedure vital signs reviewed and stable Respiratory status: spontaneous breathing, nonlabored ventilation, respiratory function stable and patient connected to nasal cannula oxygen Cardiovascular status: blood pressure returned to baseline and stable Postop Assessment: no apparent nausea or vomiting Anesthetic complications: no    Last Vitals:  Vitals:   07/16/18 1740 07/16/18 1745  BP: (!) 127/57   Pulse:  71  Resp:  20  Temp:  36.5 C  SpO2:  99%    Last Pain:  Vitals:   07/16/18 1745  TempSrc:   PainSc: Asleep                 Shelton Silvas

## 2018-07-16 NOTE — Progress Notes (Signed)
[facility] [place]  Subjective  - Abdominal pain stable.   Objective (!) 118/59 65 98.1 F (36.7 C) (Oral) 18 (!) 86%  Intake/Output Summary (Last 24 hours) at 07/16/2018 1304 Last data filed at 07/16/2018 0800 Gross per 24 hour  Intake 785.71 ml  Output 1700 ml  Net -914.29 ml      Laboratory Lab Results: Recent Labs    07/15/18 0442 07/16/18 0344  WBC 25.0*  24.6* 21.8*  HGB 10.9*  10.8* 9.8*  HCT 36.8  36.1 31.1*  PLT 448*  454* 460*   BMET Recent Labs    07/15/18 0739 07/16/18 0344  NA 136 138  K 4.3 4.3  CL 99 104  CO2 24 25  GLUCOSE 125* 118*  BUN 9 11  CREATININE 0.58 0.56  CALCIUM 8.6* 8.5*    COAG Lab Results  Component Value Date   INR 1.3 (H) 07/10/2018   INR 1.7 (H) 07/07/2018   INR 1.05 05/17/2009   No results found for: PTT  Assessment/Planning:  Plan for mesenteric arteriogram tomorrow, but that has been delayed since perforation of descending colon and plan for IR drain today.  Dr. Myra Gianotti spoke with general surgery yesterday.  Potential OR if perc drain fails for colectomy.  Concern for risk of aortobifem infection.    Cephus Shelling 07/16/2018 1:04 PM --

## 2018-07-16 NOTE — Progress Notes (Signed)
[facility]/Trauma Progress Note  2 Days Post-Op   Assessment/Plan A. 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 - minimal pain on left, No peritonitis. No indication for emergent surgery at this time -  IV abx per ID - IR will try to place drain today.  - pt will need diverting colostomy and/or Hartman's procedure if she worsens - recommend bowel rest, NPO, ice chips okay, sips with meds  - We will continue to follow   ID - none currently, per ID VTE - SCDs, heparin gtt FEN - NPO  Follow up - TBD   LOS: 9 days    Subjective: CC: abdominal pain  Pt states no issues overnight. She has not needed any pain medicine overnight for her abdominal pain. She had copious flatus overnight with mild increase in pain. She denies nausea, vomiting or fever. Nurse at bedside.   Objective: Vital signs in last 24 hours: Temp:  [97.8 F (36.6 C)-98.6 F (37 C)] 97.9 F (36.6 C) (04/16 0800) Pulse Rate:  [63-68] 68 (04/16 0508) Resp:  [14-20] 20 (04/16 0800) BP: (130-138)/(62-68) 135/66 (04/16 0800) SpO2:  [94 %-97 %] 97 % (04/16 0800) Weight:  [62.4 kg] 62.4 kg (04/16 0508) Last BM Date: 07/14/18  Intake/Output from previous day: 04/15 0701 - 04/16 0700 In: 220 [P.O.:220] Out: 400 [Urine:400] Intake/Output this shift: Total I/O In: 785.7 [I.V.:785.7] Out: 1300 [Urine:1300]  PE: Gen:  Alert, NAD, pleasant, cooperative,  Pulm:  Rate and effort normal Abd: Soft, ND, +BS, no HSM, mild TTP of left hemiabdomen, 4/10 with palpation of LUQ with guarding, no peritonitis  Skin: warm and dry   Anti-infectives: Anti-infectives  (From admission, onward)   Start     Dose/Rate Route Frequency Ordered Stop   07/15/18 1700  meropenem (MERREM) 1 g in sodium chloride 0.9 % 100 mL IVPB     1 g 200 mL/hr over 30 Minutes Intravenous Every 8 hours 07/15/18 1620     07/08/18 2300  vancomycin (VANCOCIN) 1,500 mg in sodium chloride 0.9 % 500 mL IVPB  Status:  Discontinued     1,500 mg 250 mL/hr over 120 Minutes Intravenous  Once 07/07/18 2128 07/07/18 2129   07/08/18 2300  vancomycin (VANCOCIN) 1,500 mg in sodium chloride 0.9 % 500 mL IVPB  Status:  Discontinued     1,500 mg 250 mL/hr over 120 Minutes Intravenous Every 24 hours 07/07/18 2129 07/08/18 0726   07/08/18 0600  ceFEPIme (MAXIPIME) 2 g in sodium chloride 0.9 % 100 mL IVPB  Status:  Discontinued     2 g 200 mL/hr over 30 Minutes Intravenous Every 8 hours 07/07/18 2124 07/13/18 1333   07/07/18 2130  vancomycin (VANCOCIN) 1,500 mg in sodium chloride 0.9 % 500 mL IVPB     1,500 mg 250 mL/hr over 120 Minutes Intravenous  Once 07/07/18 2123 07/08/18 0028   07/07/18 2115  vancomycin (VANCOCIN) IVPB 1000 mg/200 mL premix  Status:  Discontinued     1,000 mg 200 mL/hr over 60 Minutes Intravenous  Once 07/07/18 2110 07/07/18 2128   07/07/18 2115  ceFEPIme (MAXIPIME) 2 g in sodium chloride 0.9 % 100 mL IVPB  2 g 200 mL/hr over 30 Minutes Intravenous  Once 07/07/18 2110 07/07/18 2203      Lab Results:  Recent Labs    07/15/18 0442 07/16/18 0344  WBC 25.0*  24.6* 21.8*  HGB 10.9*  10.8* 9.8*  HCT 36.8  36.1 31.1*  PLT 448*  454* 460*   BMET Recent Labs    07/15/18 0739 07/16/18 0344  NA 136 138  K 4.3 4.3  CL 99 104  CO2 24 25  GLUCOSE 125* 118*  BUN 9 11  CREATININE 0.58 0.56  CALCIUM 8.6* 8.5*   PT/INR No results for input(s): LABPROT, INR in the last 72 hours. CMP     Component Value Date/Time   NA 138 07/16/2018 0344   NA 142 05/22/2018 1039   K 4.3 07/16/2018 0344   CL 104 07/16/2018 0344   CO2 25 07/16/2018 0344   GLUCOSE 118 (H)  07/16/2018 0344   BUN 11 07/16/2018 0344   BUN 10 05/22/2018 1039   CREATININE 0.56 07/16/2018 0344   CREATININE 0.72 08/07/2012 1016   CALCIUM 8.5 (L) 07/16/2018 0344   PROT 6.0 (L) 07/15/2018 0739   PROT 6.8 05/22/2018 1039   ALBUMIN 1.8 (L) 07/15/2018 0739   ALBUMIN 3.6 (L) 05/22/2018 1039   AST 28 07/15/2018 0739   ALT 13 07/15/2018 0739   ALKPHOS 121 07/15/2018 0739   BILITOT 0.7 07/15/2018 0739   BILITOT 0.3 05/22/2018 1039   GFRNONAA >60 07/16/2018 0344   GFRNONAA 88 08/07/2012 1016   GFRAA >60 07/16/2018 0344   GFRAA >89 08/07/2012 1016   Lipase     Component Value Date/Time   LIPASE 65 (H) 07/08/2018 1325    Studies/Results: Ct Abdomen Pelvis W Contrast  Result Date: 07/15/2018 CLINICAL DATA:  73 year old female with history of nausea and inability to eat for the past 6 months. 60 pounds of weight loss. EXAM: CT ABDOMEN AND PELVIS WITH CONTRAST TECHNIQUE: Multidetector CT imaging of the abdomen and pelvis was performed using the standard protocol following bolus administration of intravenous contrast. CONTRAST:  100mL ISOVUE-300 IOPAMIDOL (ISOVUE-300) INJECTION 61% COMPARISON:  CT the abdomen and pelvis 07/09/2018. FINDINGS: Lower chest: Areas of scarring in the inferior segment of the lingula and posterior aspect of the right lower lobe. Aortic atherosclerosis. Calcifications of the mitral annulus. Hepatobiliary: Subcentimeter low-attenuation lesion in segment 4A of the liver, too small to characterize, but statistically likely to represent a tiny cyst. No other suspicious hepatic lesions. No intra or extrahepatic biliary ductal dilatation. Amorphous intermediate attenuation material lying dependently in the gallbladder, new compared to the prior study, favored to reflect vicarious excretion of contrast related to recent CT examination. Pancreas: Low-attenuation lesion in the tail of the pancreas, slightly decreased compared to the prior study, currently measuring 2.4 x 2.8 cm  (axial image 26 of series 3), with persistent surrounding inflammatory changes. No other pancreatic mass. No pancreatic ductal dilatation. 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 Adrenals/Urinary Tract: There is a small amount of subcapsular fluid associated with the anterior and lateral aspect of the upper and interpolar region of the left kidney, which appears similar to the prior study and has likely dissected posteriorly from the pancreatic fluid collection mentioned above. Mild multifocal cortical thinning in the right kidney, most severe in the lower pole. 5 mm nonobstructive calculus in the interpolar collecting system of the right kidney. No hydroureteronephrosis. Urinary bladder is normal in appearance. Bilateral  adrenal glands are normal in appearance. Stomach/Bowel: Normal appearance of the stomach. No pathologic dilatation of small bowel or colon. In the region of the descending colon there is again some mural thickening proximally. In the mid to distal descending colon there is discontinuity of the anterior colonic wall with gas extending anteriorly into the adjacent fatty soft tissues (presumably omentum), indicative of frank perforation. Normal appendix. Vascular/Lymphatic: Aortic atherosclerosis with postoperative changes of aortobifemoral bypass procedure. 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 Superior mesenteric artery appears grossly patent. IMA is not confidently identified. No lymphadenopathy noted in the abdomen or pelvis. Reproductive: Status post hysterectomy. Right ovary is atrophic. 3.4 x 2.5 x 3.1 cm low-attenuation lesion in the left adnexa is likely to represent a cyst. Other: Despite the apparent perforation of the descending colon and gas which appears to be contained within a portion of the omentum in the left side of the  abdomen, there is no other definite pneumoperitoneum. No significant volume of ascites. Musculoskeletal: There are no aggressive appearing lytic or blastic lesions noted in the visualized portions of the skeleton. Status post PLIF from L3-L5 with interbody grafts at L3-L4 and L4-L5. IMPRESSION: 1. Compared to the prior examination there is now frank perforation of the descending colon which appears to be predominantly contained within a portion of the omentum in the left side of the abdomen. Surgical consultation is strongly recommended. 2. 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 A small portion of this collection appears to have dissected posteriorly now involving the subcapsular space associated with the upper and interpolar regions of the left kidney. 3. 5 mm nonobstructive calculus in the interpolar collecting system of the right kidney. 4. Additional incidental findings, as above, similar to the prior examination. Critical Value/emergent results were called by telephone at the time of interpretation on 07/15/2018 at 3:46 pm to Dr. Candelaria Stagers , who verbally acknowledged these results. Electronically Signed   By: Trudie Reed M.D.   On: 07/15/2018 15:47      Jerre Simon , [facility] Surgery 07/16/2018, 8:16 AM  Pager: [phone] Mon-Wed, Friday 7:00am-4:30pm Thurs 7am-11:30am  Consults: [phone]

## 2018-07-16 NOTE — Progress Notes (Signed)
PROGRESS NOTE  [name] ZOX:[id number] DOB: [birth year] DOA: 07/07/2018 PCP: Junie Spencer, [name]   LOS: 9 days   Brief Narrative / Interim history:  73 year old female with history of chronic N/V/abdominal pain, A. fib, PVD status post right femoral thrombectomy on 05/31/2018, hypertension and chronic back pain presenting with intractable nausea, vomiting, abdominal pain, new onset of diarrhea and about 30 pounds weight loss in the last several months.   She was admitted to [facility] 5 days ago with somnolence and a diagnosis of sepsis. She has had a prior aortobifem by Dr Hart Rochester in 2002 and had thrombectomy of the right limb of this by Dr Randie Heinz 3/20. 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 Her left internal iliac artery was patent at that point.   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 Also there is a 3.9 x 1.8 cm and 2.6 x 1.1 cm fluid collection in right pelvis/groin thought to be seroma from prior surgery.   Per H&P, interventional radiology at [facility] was consulted and consideration was being given for stenting her SMA which has about a (30-50% stenosis) to improve mesenteric blood flow.   Patient was transferred to [facility] for further management.  Patient was also treated with cefepime for presumed pneumonia.  Gastroenterology and vascular surgery consulted.  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  Biopsies taken and pending.  She also had flexible sigmoidoscopy that showed only a 7 mm sessile polyp in the rectum  but poor prep.  On 4/15, and continued to have significant leukocytosis, mild temperature and abdominal tenderness.  This raised concern about intra-abdominal occult infection especially with the fluid collections noted on CT abdomen.  Infectious disease consulted and started patient on meropenem.  IR consulted and recommended getting repeat CT abdomen and pelvis which showed frank perforation of the descending colon appears to be contained within a portion of the omentum, or occluded celiac axis, slightly decreased pancreatic tail lesion but no previously noted fluid collections.  General surgery consulted and recommended drain placement by IR.     Subjective: No major events overnight of this morning.  No abdominal pain at rest.  Denies nausea or vomiting.  Denies chest pain or dyspnea.  CT findings as above.  Assessment & Plan: Principal Problem:   Lower abdominal pain Active Problems:   Hyperlipidemia with target LDL less than 100   TOBACCO ABUSE   Essential hypertension   Peripheral vascular disease (HCC)   Sepsis (HCC)   Severe protein-calorie malnutrition (HCC)   Abnormal liver function   Loss of weight   Nausea without vomiting   Abnormal CT scan, colon   Bowel perforation (HCC)  Intractable N/V/abdominal pain/contained bowel perforation/leukocytosis/possible contained perforated diverticulitis: CT findings as above.  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 -Pain management -Trend leukocytosis.  Significant weight loss/moderate protein calorie malnutrition:  Likely due to poor p.o. intake from the above. -Appreciate nutrition input  History of COPD: Stable -Continue DuoNeb  Peripheral vascular disease with dry gangrene -Continue home statin -Outpatient  vascular surgery follow-up  Essential hypertension: Normotensive. -Continue home meds  Chronic atrial fibrillation: Not in RVR.  [place] vas score 6.  On Eliquis and metoprolol at home. -Heparin and metoprolol-Heparin on hold for IR  Sepsis secondary to #1 above -Manage as above.  Chronic pain: -Continue home medications (Lyrica, Zanaflex).  Tobacco use disorder: -Cessation counseling -Nicotine patch  Scheduled Meds:  atorvastatin  40 mg Oral Daily   Chlorhexidine Gluconate Cloth  6 each Topical Q0600   feeding supplement (ENSURE ENLIVE)  237 mL Oral TID BM   folic acid  1 mg Oral Daily   lidocaine  1 patch Transdermal Q24H   metoprolol tartrate  75 mg Oral BID   multivitamin with minerals  1 tablet Oral Daily   nicotine  21 mg Transdermal Daily   pantoprazole  40 mg Oral BID AC   potassium chloride  40 mEq Oral Daily   pregabalin  200 mg Oral TID   thiamine  100 mg Oral Daily   Or   thiamine  100 mg Intravenous Daily   Continuous Infusions:  sodium chloride Stopped (07/16/18 0842)   meropenem (MERREM) IV 1 g (07/16/18 0652)   PRN Meds:.sodium chloride, acetaminophen **OR** acetaminophen, alum & mag hydroxide-simeth, fentaNYL, hydrALAZINE, ipratropium-albuterol, labetalol, midazolam, ondansetron (ZOFRAN) IV, senna-docusate, tiZANidine  DVT prophylaxis: On heparin drip for atrial fibrillation-but on hold for procedure Code Status: Full code Family Communication: None at bedside.  Updated patient's daughter over the phone on 4/15 Disposition Plan: Remains inpatient  Consultants:   Vascular surgery  Gastroenterology  Infectious disease  Interventional radiology  General surgery  Procedures:   EGD on 4/14  Flex sigmoidoscopy on 4/14  Antimicrobials:  Vancomycin on 4/7  Cefepime 4/7-4/12  Meropenem 4/15--  Objective: Vitals:   07/16/18 1105 07/16/18 1110 07/16/18 1115 07/16/18 1120  BP: 129/72 128/60 130/64 139/64  Pulse: 67 69 67 65    Resp: 15 19 15 19   Temp:      TempSrc:      SpO2: 96% 91% 97% 95%  Weight:      Height:        Intake/Output Summary (Last 24 hours) at 07/16/2018 1122 Last data filed at 07/16/2018 0800 Gross per 24 hour  Intake 905.71 ml  Output 1700 ml  Net -794.29 ml   Filed Weights   07/14/18 1406 07/15/18 0515 07/16/18 0508  Weight: 62.3 kg 63 kg 62.4 kg    Examination:  GENERAL: Appears well. No acute distress.  EYES - vision grossly intact. Sclera anicteric.  NOSE- no gross deformity or drainage MOUTH - no oral lesions noted THROAT- no swelling or erythema LUNGS:  No IWOB.  Fair air movement bilaterally. HEART:   RRR. Heart sounds normal. ABD: Bowel sounds present. Soft.  Tenderness over RLQ and LLQ.  Some guarding over those areas. MSK/EXT: Moves extremities. No obvious deformity. SKIN: no apparent skin lesion.  NEURO: Awake, alert and oriented appropriately.  No gross deficit.  PSYCH: Calm. Normal affect.    Data Reviewed: I have independently reviewed following labs and imaging studies  CBC: Recent Labs  Lab 07/12/18 0356 07/13/18 0401 07/14/18 0457 07/15/18 0442 07/16/18 0344  WBC 23.7* 19.8* 21.1* 25.0*   24.6* 21.8*  NEUTROABS  --   --   --  21.9*  --   HGB  8.8* 10.2* 9.9* 10.9*   10.8* 9.8*  HCT 29.4* 34.3* 32.9* 36.8   36.1 31.1*  MCV 82.6 83.9 81.8 84.0   82.6 81.8  PLT 426* 438* 476* 448*   454* 460*   Basic Metabolic Panel: Recent Labs  Lab 07/10/18 0536  07/11/18 1949 07/12/18 0356 07/14/18 0457 07/15/18 0739 07/16/18 0344  NA 134*   < > 130* 133* 135 136 138  K 3.3*   < > 5.0 4.5 3.5 4.3 4.3  CL 102   < > 98 99 102 99 104  CO2 20*   < > 21* 20* 23 24 25   GLUCOSE 110*   < > 116* 87 79 125* 118*  BUN 8   < > 7* 8 5* 9 11  CREATININE 0.54   < > 0.62 0.72 0.58 0.58 0.56  CALCIUM 8.5*   < > 8.1* 8.3* 8.1* 8.6* 8.5*  MG 1.7  --  1.5* 1.7  --   --   --    < > = values in this interval not displayed.   GFR: Estimated Creatinine Clearance: 62.7  mL/min (by C-G formula based on SCr of 0.56 mg/dL). Liver Function Tests: Recent Labs  Lab 07/11/18 1949 07/15/18 0739  AST 36 28  ALT 14 13  ALKPHOS 119 121  BILITOT 0.8 0.7  PROT 5.3* 6.0*  ALBUMIN 1.6* 1.8*   No results for input(s): LIPASE, AMYLASE in the last 168 hours. No results for input(s): AMMONIA in the last 168 hours. Coagulation Profile: Recent Labs  Lab 07/10/18 1410  INR 1.3*   Cardiac Enzymes: No results for input(s): CKTOTAL, CKMB, CKMBINDEX, TROPONINI in the last 168 hours. BNP (last 3 results) No results for input(s): PROBNP in the last 8760 hours. HbA1C: No results for input(s): HGBA1C in the last 72 hours. CBG: Recent Labs  Lab 07/11/18 1715 07/15/18 2053 07/16/18 0608  GLUCAP 124* 133* 98   Lipid Profile: No results for input(s): CHOL, HDL, LDLCALC, TRIG, CHOLHDL, LDLDIRECT in the last 72 hours. Thyroid Function Tests: No results for input(s): TSH, T4TOTAL, FREET4, T3FREE, THYROIDAB in the last 72 hours. Anemia Panel: No results for input(s): VITAMINB12, FOLATE, FERRITIN, TIBC, IRON, RETICCTPCT in the last 72 hours. Urine analysis:    Component Value Date/Time   COLORURINE AMBER (A) 07/07/2018 1826   APPEARANCEUR HAZY (A) 07/07/2018 1826   LABSPEC 1.014 07/07/2018 1826   PHURINE 5.0 07/07/2018 1826   GLUCOSEU 50 (A) 07/07/2018 1826   HGBUR NEGATIVE 07/07/2018 1826   BILIRUBINUR NEGATIVE 07/07/2018 1826   KETONESUR NEGATIVE 07/07/2018 1826   PROTEINUR NEGATIVE 07/07/2018 1826   NITRITE NEGATIVE 07/07/2018 1826   LEUKOCYTESUR NEGATIVE 07/07/2018 1826   Sepsis Labs: Invalid input(s): PROCALCITONIN, LACTICIDVEN  Recent Results (from the past 240 hour(s))  Blood culture (routine x 2)     Status: None   Collection Time: 07/07/18  8:02 PM  Result Value Ref Range Status   Specimen Description BLOOD RIGHT ANTECUBITAL  Final   Special Requests   Final    BOTTLES DRAWN AEROBIC AND ANAEROBIC Blood Culture adequate volume   Culture   Final     NO GROWTH 5 DAYS Performed at [facility], [address] [place], [place] [id number]    Report Status 07/12/2018 FINAL  Final  Blood culture (routine x 2)     Status: None   Collection Time: 07/07/18  8:07 PM  Result Value Ref Range Status   Specimen Description BLOOD RIGHT HAND  Final  Special Requests   Final    BOTTLES DRAWN AEROBIC AND ANAEROBIC Blood Culture adequate volume   Culture   Final    NO GROWTH 5 DAYS Performed at [facility], [address], [place], [place] [address]    Report Status 07/12/2018 FINAL  Final  MRSA PCR Screening     Status: None   Collection Time: 07/08/18 12:37 AM  Result Value Ref Range Status   MRSA by PCR NEGATIVE NEGATIVE Final    Comment:        The GeneXpert MRSA Assay (FDA approved for NASAL specimens only), is one component of a comprehensive MRSA colonization surveillance program. It is not intended to diagnose MRSA infection nor to guide or monitor treatment for MRSA infections. Performed at [facility], [address], [place], [place] [address]       Radiology Studies: Ct Abdomen Pelvis W Contrast  Result Date: 07/15/2018 CLINICAL DATA:  73 year old female with history of nausea and inability to eat for the past 6 months. 60 pounds of weight loss. EXAM: CT ABDOMEN AND PELVIS WITH CONTRAST TECHNIQUE: Multidetector CT imaging of the abdomen and pelvis was performed using the standard protocol following bolus administration of intravenous contrast. CONTRAST:  ISOVUE-300 IOPAMIDOL (ISOVUE-300) INJECTION 61% COMPARISON:  CT the abdomen and pelvis 07/09/2018. FINDINGS: Lower chest: Areas of scarring in the inferior segment of the lingula and posterior aspect of the right lower lobe. Aortic atherosclerosis. Calcifications of the mitral annulus. Hepatobiliary: Subcentimeter low-attenuation lesion in segment 4A of the liver, too small to characterize, but statistically likely to represent a tiny cyst. No other suspicious hepatic  lesions. No intra or extrahepatic biliary ductal dilatation. Amorphous intermediate attenuation material lying dependently in the gallbladder, new compared to the prior study, favored to reflect vicarious excretion of contrast related to recent CT examination. Pancreas: Low-attenuation lesion in the tail of the pancreas, slightly decreased compared to the prior study, currently measuring 2.4 x 2.8 cm (axial image 26 of series 3), with persistent surrounding inflammatory changes. No other pancreatic mass. No pancreatic ductal dilatation. 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 Adrenals/Urinary Tract: There is a small amount of subcapsular fluid associated with the anterior and lateral aspect of the upper and interpolar region of the left kidney, which appears similar to the prior study and has likely dissected posteriorly from the pancreatic fluid collection mentioned above. Mild multifocal cortical thinning in the right kidney, most severe in the lower pole. 5 mm nonobstructive calculus in the interpolar collecting system of the right kidney. No hydroureteronephrosis. Urinary bladder is normal in appearance. Bilateral adrenal glands are normal in appearance. Stomach/Bowel: Normal appearance of the stomach. No pathologic dilatation of small bowel or colon. In the region of the descending colon there is again some mural thickening proximally. In the mid to distal descending colon there is discontinuity of the anterior colonic wall with gas extending anteriorly into the adjacent fatty soft tissues (presumably omentum), indicative of frank perforation. Normal appendix. Vascular/Lymphatic: Aortic atherosclerosis with postoperative changes of aortobifemoral bypass procedure. 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 Superior mesenteric artery appears grossly patent.  IMA is not confidently identified. No lymphadenopathy noted in the abdomen or pelvis. Reproductive: Status post hysterectomy. Right ovary is atrophic. 3.4 x 2.5 x 3.1 cm low-attenuation lesion in the left adnexa is likely to represent a cyst. Other: Despite the apparent  perforation of the descending colon and gas which appears to be contained within a portion of the omentum in the left side of the abdomen, there is no other definite pneumoperitoneum. No significant volume of ascites. Musculoskeletal: There are no aggressive appearing lytic or blastic lesions noted in the visualized portions of the skeleton. Status post PLIF from L3-L5 with interbody grafts at L3-L4 and L4-L5. IMPRESSION: 1. Compared to the prior examination there is now frank perforation of the descending colon which appears to be predominantly contained within a portion of the omentum in the left side of the abdomen. Surgical consultation is strongly recommended. 2. 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 A small portion of this collection appears to have dissected posteriorly now involving the subcapsular space associated with the upper and interpolar regions of the left kidney. 3. 5 mm nonobstructive calculus in the interpolar collecting system of the right kidney. 4. Additional incidental findings, as above, similar to the prior examination. Critical Value/emergent results were called by telephone at the time of interpretation on 07/15/2018 at 3:46 pm to Dr. Candelaria Stagers , who verbally acknowledged these results. Electronically Signed   By: Trudie Reed M.D.   On: 07/15/2018 15:47    Avanell Banwart T. [facility] Pager [phone]  If 7PM-7AM, please contact night-coverage [email or web address] Password [facility] 07/16/2018, 11:22 AM

## 2018-07-16 NOTE — Progress Notes (Signed)
PT Cancellation Note  Patient Details Name: [name] MRN: [id number] DOB: [birth year]   Cancelled Treatment:    Reason Eval/Treat Not Completed: Patient at procedure or test/unavailable;Patient not medically ready.  Will see pt as able 4/17.   07/16/2018  Tremont Bing, PT Acute Rehabilitation Services [phone]  (pager) [phone]  (office)   [name] 07/16/2018, 1:34 PM

## 2018-07-16 NOTE — Progress Notes (Addendum)
Report called to [facility] in Short stay.  [name] was transported down at 1305 pm today.  Permit signed.

## 2018-07-16 NOTE — Consult Note (Signed)
Chief Complaint: Patient was seen in consultation today for intra abdominal abscess drain placement Chief Complaint  Patient presents with   Fall   at the request of Dr Grayling Congress   Supervising Physician: Richarda Overlie  Patient Status: [facility] - In-pt  History of Present Illness: DEMARI Grant is a 73 y.o. female   abd pain for months; N/V Tobacco abuse; SMA stenosis Decreased po intake Increased wt loss  CT yesterday: IMPRESSION: 1. Compared to the prior examination there is now frank perforation of the descending colon which appears to be predominantly contained within a portion of the omentum in the left side of the abdomen. Surgical consultation is strongly recommended. 2. 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 A small portion of this collection appears to have dissected posteriorly now involving the subcapsular space associated with the upper and interpolar regions of the left kidney. 3. 5 mm nonobstructive calculus in the interpolar collecting system of the right kidney. 4. Additional incidental findings, as above, similar to the prior examination.  Dr Derrell Lolling requesting drain placement Dr Lowella Dandy has discussed with Dr Derrell Lolling and approves procedure  Past Medical History:  Diagnosis Date   Chronic back pain    GERD (gastroesophageal reflux disease)    Hypercholesterolemia    Hyperlipidemia    Hypertension    Osteoarthritis of left knee    End-stage   Palpitations    PVD (peripheral vascular disease) (HCC)    Tobacco user     Past Surgical History:  Procedure Laterality Date   Angiogram-type unspecified  06/13/00   Dr. Hart [place]   FEMORAL-POPLITEAL BYPASS GRAFT     FINGER TENDON REPAIR  1988   Right mallet   KNEE ARTHROSCOPY  1996   Left   LUMBAR FUSION  1998   Dr. Threasa Heads ANGIOPLASTY Right 05/31/2018   Procedure: PATCH ANGIOPLASTY RIGHT FEMORAL ARTERY WITH DECRON PATCH;  Surgeon: Maeola Harman, MD;  Location: [facility] OR;  Service: Vascular;  Laterality: Right;   THROMBECTOMY FEMORAL ARTERY Right 05/31/2018   Procedure: RIGHT FEMORAL THROMBECTOMY;  Surgeon: Maeola Harman, MD;  Location: [facility] OR;  Service: Vascular;  Laterality: Right;    Allergies: Patient has no known allergies.  Medications: Prior to Admission medications   Medication Sig Start Date End Date Taking? Authorizing Provider  alum & mag hydroxide-simeth (MAALOX/MYLANTA) 200-200-20 MG/5ML suspension Take 15-30 mLs by mouth every 2 (two) hours as needed for indigestion. 06/08/18  Yes Delano Metz, MD  amLODipine (NORVASC) 10 MG tablet Take 1 tablet (10 mg total) by mouth daily. 06/08/18  Yes Delano Metz, MD  apixaban (ELIQUIS) 5 MG TABS tablet Take 1 tablet (5 mg total) by mouth 2 (two) times daily. 06/08/18  Yes Delano Metz, MD  atorvastatin (LIPITOR) 40 MG tablet Take 1 tablet (40 mg total) by mouth daily. 10/23/17  Yes Martin, Mary-Margaret, [name]  DULoxetine (CYMBALTA) 60 MG capsule Take 1 capsule (60 mg total) by mouth daily. 10/23/17  Yes Martin, Mary-Margaret, [name]  fenofibrate 160 MG tablet TAKE 1 TABLET BY MOUTH ONCE DAILY Patient taking differently: Take 160 mg by mouth daily.  11/25/17  Yes Martin, Mary-Margaret, [name]  lidocaine (LIDODERM) 5 % Place 1 patch onto the skin daily. Remove & Discard patch within 12 hours or as directed by MD 06/08/18  Yes Delano Metz, MD  metoprolol tartrate (LOPRESSOR) 25 MG tablet  Take 0.5 tablets (12.5 mg total) by mouth 2 (two) times daily. Patient taking differently: Take 12.5 mg by mouth 3 (three) times daily.  06/08/18  Yes Delano MetzSchertz, Robert, MD  nicotine (NICODERM CQ - DOSED IN MG/24 HOURS) 21 mg/24hr patch Place 1 patch (21 mg total) onto the skin daily. 06/08/18  Yes Delano MetzSchertz, Robert, MD  ondansetron (ZOFRAN) 4 MG tablet Take 4 mg by  mouth every 4 (four) hours as needed for nausea or vomiting.   Yes [provider]  potassium chloride (K-DUR) 10 MEQ tablet Take 10 mEq by mouth daily.   Yes [provider]  pregabalin (LYRICA) 200 MG capsule Take 1 capsule by mouth 3 (three) times daily. 05/20/18  Yes [provider]  senna-docusate (SENOKOT-S) 8.6-50 MG tablet Take 2 tablets by mouth at bedtime as needed for mild constipation or moderate constipation. Patient taking differently: Take 1 tablet by mouth daily.  06/08/18  Yes Delano MetzSchertz, Robert, MD  tiZANidine (ZANAFLEX) 4 MG tablet Take 1-2 tablets (4-8 mg total) by mouth every 8 (eight) hours as needed for muscle spasms. 09/02/16  Yes Daphine DeutscherMartin, Mary-Margaret, [name]     Family History  Problem Relation Age of Onset   Heart failure Father        CHF   Hypertension Father    Heart disease Father    Hypertension Paternal Aunt    Hypertension Paternal Uncle    Hypertension Paternal Grandmother    Hypertension Paternal Grandfather    Coronary artery disease Neg Hx        Premature    Social History   Socioeconomic History   Marital status: Divorced    Spouse name: Not on file   Number of children: Not on file   Years of education: Not on file   Highest education level: Not on file  Occupational History   Occupation: Charity fundraiserWaiter    Employer: [employer]  Social Needs   Physicist, medicalinancial resource strain: Not on file   Food insecurity:    Worry: Not on file    Inability: Not on file   Transportation needs:    Medical: Not on file    Non-medical: Not on file  Tobacco Use   Smoking status: Current Every Day Smoker    Packs/day: 1.00    Years: 52.00    Pack years: 52.00    Types: Cigarettes   Smokeless tobacco: Never Used  Substance and Sexual Activity   Alcohol use: No    Comment: Occasionally   Drug use: No   Sexual activity: Not Currently  Lifestyle   Physical activity:    Days per week: Not on file    Minutes per  session: Not on file   Stress: Not on file  Relationships   Social connections:    Talks on phone: Not on file    Gets together: Not on file    Attends religious service: Not on file    Active member of club or organization: Not on file    Attends meetings of clubs or organizations: Not on file    Relationship status: Not on file  Other Topics Concern   Not on file  Social History Narrative   Not on file    Review of Systems: A 12 point ROS discussed and pertinent positives are indicated in the HPI above.  All other systems are negative.  Review of Systems  Constitutional: Positive for activity change, appetite change, fatigue and unexpected weight change.  Respiratory: Negative for shortness of  breath.   Gastrointestinal: Positive for abdominal pain, nausea and vomiting.  Neurological: Positive for weakness.  Psychiatric/Behavioral: Negative for behavioral problems and confusion.    Vital Signs: BP 135/66 (BP Location: Left Arm)    Pulse 68    Temp 97.9 F (36.6 C) (Oral)    Resp 20    Ht  (1.702 m)    Wt 137 lb 9.1 oz (62.4 kg)    SpO2 97%    BMI 21.55 kg/m   Physical Exam Vitals signs reviewed.  Cardiovascular:     Rate and Rhythm: Normal rate and regular rhythm.  Pulmonary:     Breath sounds: Normal breath sounds.  Abdominal:     General: Bowel sounds are normal.     Tenderness: There is abdominal tenderness.  Musculoskeletal: Normal range of motion.  Skin:    General: Skin is warm and dry.  Neurological:     Mental Status: She is alert and oriented to person, place, and time.  Psychiatric:        Mood and Affect: Mood normal.        Behavior: Behavior normal.        Thought Content: Thought content normal.        Judgment: Judgment normal.     Imaging: Ct Abdomen Pelvis W Wo Contrast  Result Date: 07/09/2018 CLINICAL DATA:  Generalized weakness with nausea and left lower abdominal pain. Known mesenteric ischemia pancreatitis. EXAM: CT ABDOMEN AND  PELVIS WITHOUT AND WITH CONTRAST TECHNIQUE: Multidetector CT imaging of the abdomen and pelvis was performed following the standard protocol before and following the bolus administration of intravenous contrast. CONTRAST:  OMNIPAQUE IOHEXOL 300 MG/ML  SOLN COMPARISON:  05/27/2018 FINDINGS: Lower chest: Dependent atelectasis noted posterior right lower lobe. Hepatobiliary: 4 mm low-density lesion medial segment left liver too small to characterize but likely benign etiology such as cyst. There is no evidence for gallstones, gallbladder wall thickening, or pericholecystic fluid. No intrahepatic or extrahepatic biliary dilation. Pancreas: Interval organization of the fluid collection seen previously the pancreatic tail. Dominant component in the pancreatic tail demonstrates rim enhancement and measures 2.5 x 3.3 cm (36/4). This fluid than dissects posteriorly into the left perirenal space and is contiguous with a 3.1 x 1.0 cm rim enhancing fluid collection along the anterior margin of the upper pole left kidney. Peripancreatic edema/inflammation in the region of the pancreatic tail is evident. 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 Adrenals/Urinary Tract: No adrenal nodule or mass. 6 mm nonobstructing stone identified interpolar right kidney. Mild right hydronephrosis is associated with distention of the proximal right ureter. Similar mild left hydroureteronephrosis evident. Right ureter transitions to normal diameter at the level of the right external iliac artery. Left ureter remains distended down to the left pelvic sidewall as well. Bladder is distended. Stomach/Bowel: Small hiatal hernia. Stomach otherwise unremarkable. Duodenum is normally positioned as is the ligament of Treitz. Small bowel loops are fluid-filled and distended up to 2.7 cm diameter. No discrete transition zone can be identified, but distal small bowel and terminal ileum are decompressed. The  appendix is not visualized, but there is no edema or inflammation in the region of the cecum. Right colon is unremarkable. Transverse colon is distended. Distal transverse colon and splenic flexure shows circumferential wall thickening. Descending colon is abnormal, demonstrating pericolonic edema/inflammation. Lateral and posterior wall of the descending colon is thickened but anterior and medial wall is imperceptible. Distal descending colon into the sigmoid colon shows  circumferential wall thickening again. There is fluid in the left paracolic gutter and edema/fluid tracks into the left omentum. 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 3.9 x 1.8 cm fluid collection is identified adjacent to the distal aspect of the right external iliac limb (119/4). 2.6 x 1.1 cm collection in the right groin may be a seroma. Celiac axis remains occluded. SMA is opacified. Portal vein is patent. Splenic vein is markedly attenuated but remains patent. The SMV is patent. Reproductive: Uterus surgically absent. 2.2 x 3.6 cm cystic lesion along the left pelvic sidewall is presumably ovarian and unchanged since prior study. Other: No substantial intraperitoneal free fluid although there is a small amount of fluid in the para colic gutters bilaterally. No intraperitoneal free air. Musculoskeletal: No worrisome lytic or sclerotic osseous abnormality. Status post lumbosacral fusion. Spinal stimulator device evident. Intramuscular lipoma noted anterior aspect proximal right thigh. IMPRESSION: 1. Interval development of abnormal splenic flexure and descending colon. Circumferential wall thickening noted distal transverse colon and splenic flexure with proximal and mid descending colon showing posterior wall thickening but anterior colonic wall is imperceptible in this region. Distal descending colon into the sigmoid  colon again shows circumferential wall thickening. There is prominent edema/inflammation around the proximal and mid descending colon with fluid/edema tracking into the left mesentery and pooling in the left paracolic gutter. Given celiac axis and IMA occlusion, ischemic disease a concern and watershed event would be a consideration. Infectious/inflammatory colitis also possibility. 2. Mild distention of proximal and mid small bowel with decompressed small bowel loops. Features probably related to ileus from the above described colonic pathology. 3. Interval evolution of pancreatic tail pseudocyst, now with organized, enhancing rim and evidence of extension into the perinephric space, along the anterior capsule of the upper left kidney. 4. Status post aortobifemoral bypass graft. Right iliac limb now opacifies. Small fluid collection identified around the distal aspect of the right external iliac limb with increased attenuation, suggesting hematoma. 5. Probable small seroma or hematoma in the right groin. These results will be called to the ordering clinician or representative by the Radiologist Assistant, and communication documented in the PACS or zVision Dashboard. Electronically Signed   By: Kennith Center M.D.   On: 07/09/2018 19:41   Ct Head Wo Contrast  Result Date: 07/07/2018 CLINICAL DATA:  Fall today.  Head injury. EXAM: CT HEAD WITHOUT CONTRAST TECHNIQUE: Contiguous axial images were obtained from the base of the skull through the vertex without intravenous contrast. COMPARISON:  CT head 05/30/2018 FINDINGS: Brain: Ventricle size normal. Small hypodensity in the corona radiata on the right is unchanged compatible with chronic ischemia. No acute infarct, hemorrhage, or mass lesion. Vascular: Negative for hyperdense vessel Skull: Negative for fracture Sinuses/Orbits: Chronic mucoperiosteal thickening of the left sphenoid sinus. Polypoid mass in the posterior nasopharynx on the left measuring 14 x 25 mm  unchanged from the prior study. 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   On: 07/07/2018 20:01   Ct Abdomen Pelvis W Contrast  Result Date: 07/15/2018 CLINICAL DATA:  73 year old female with history of nausea and inability to eat for the past 6 months. 60 pounds of weight loss. EXAM: CT ABDOMEN AND PELVIS WITH CONTRAST TECHNIQUE: Multidetector CT imaging of the abdomen and pelvis was performed using the standard protocol following bolus administration of  intravenous contrast. CONTRAST:  ISOVUE-300 IOPAMIDOL (ISOVUE-300) INJECTION 61% COMPARISON:  CT the abdomen and pelvis 07/09/2018. FINDINGS: Lower chest: Areas of scarring in the inferior segment of the lingula and posterior aspect of the right lower lobe. Aortic atherosclerosis. Calcifications of the mitral annulus. Hepatobiliary: Subcentimeter low-attenuation lesion in segment 4A of the liver, too small to characterize, but statistically likely to represent a tiny cyst. No other suspicious hepatic lesions. No intra or extrahepatic biliary ductal dilatation. Amorphous intermediate attenuation material lying dependently in the gallbladder, new compared to the prior study, favored to reflect vicarious excretion of contrast related to recent CT examination. Pancreas: Low-attenuation lesion in the tail of the pancreas, slightly decreased compared to the prior study, currently measuring 2.4 x 2.8 cm (axial image 26 of series 3), with persistent surrounding inflammatory changes. No other pancreatic mass. No pancreatic ductal dilatation. 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 Adrenals/Urinary Tract: There is a small amount of subcapsular fluid associated with the anterior and lateral aspect of the upper and interpolar region of the left kidney, which appears  similar to the prior study and has likely dissected posteriorly from the pancreatic fluid collection mentioned above. Mild multifocal cortical thinning in the right kidney, most severe in the lower pole. 5 mm nonobstructive calculus in the interpolar collecting system of the right kidney. No hydroureteronephrosis. Urinary bladder is normal in appearance. Bilateral adrenal glands are normal in appearance. Stomach/Bowel: Normal appearance of the stomach. No pathologic dilatation of small bowel or colon. In the region of the descending colon there is again some mural thickening proximally. In the mid to distal descending colon there is discontinuity of the anterior colonic wall with gas extending anteriorly into the adjacent fatty soft tissues (presumably omentum), indicative of frank perforation. Normal appendix. Vascular/Lymphatic: Aortic atherosclerosis with postoperative changes of aortobifemoral bypass procedure. 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 Superior mesenteric artery appears grossly patent. IMA is not confidently identified. No lymphadenopathy noted in the abdomen or pelvis. Reproductive: Status post hysterectomy. Right ovary is atrophic. 3.4 x 2.5 x 3.1 cm low-attenuation lesion in the left adnexa is likely to represent a cyst. Other: Despite the apparent perforation of the descending colon and gas which appears to be contained within a portion of the omentum in the left side of the abdomen, there is no other definite pneumoperitoneum. No significant volume of ascites. Musculoskeletal: There are no aggressive appearing lytic or blastic lesions noted in the visualized portions of the skeleton. Status post PLIF from L3-L5 with interbody grafts at L3-L4 and L4-L5. IMPRESSION: 1. Compared to the prior examination there is now frank perforation of the descending colon which appears to be predominantly contained within a portion of the omentum  in the left side of the abdomen. Surgical consultation is strongly recommended. 2. 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 A small portion of this collection appears to have dissected posteriorly now involving the subcapsular space associated with the upper and interpolar regions of the left kidney. 3. 5 mm nonobstructive calculus in the interpolar collecting system of the right kidney. 4. Additional incidental findings, as above, similar to the prior examination. Critical Value/emergent results were called by telephone at the time of interpretation on 07/15/2018 at 3:46 pm to Dr. Alwyn Ren  GONFA , who verbally acknowledged these results. Electronically Signed   By: Trudie Reed M.D.   On: 07/15/2018 15:47   Dg Chest Port 1 View  Result Date: 07/11/2018 CLINICAL DATA:  Hypoxemia. EXAM: PORTABLE CHEST 1 VIEW COMPARISON:  Radiograph of July 07, 2018. FINDINGS: The heart size and mediastinal contours are within normal limits. Both lungs are clear. No pneumothorax or pleural effusion is noted. The visualized skeletal structures are unremarkable. IMPRESSION: No active disease. Electronically Signed   By: Lupita Raider, M.D.   On: 07/11/2018 16:31   Dg Chest Portable 1 View  Result Date: 07/07/2018 CLINICAL DATA:  Pain following fall EXAM: PORTABLE CHEST 1 VIEW COMPARISON:  June 03, 2018 FINDINGS: Atelectatic change in the medial right base noted. Lungs elsewhere clear. Heart size and pulmonary vascularity are normal. No adenopathy. No pneumothorax. No bone lesions. Thoracic stimulator lead tips are in the lower thoracic region. IMPRESSION: Atelectasis medial right base. Early pneumonia in this area cannot be excluded. Lungs elsewhere clear. Stable cardiac silhouette. Electronically Signed   By: Bretta Bang III M.D.   On: 07/07/2018 19:59   US  Abdomen Limited Ruq  Result Date: 07/08/2018 CLINICAL DATA:  Abnormal liver function test. EXAM: ULTRASOUND ABDOMEN LIMITED RIGHT UPPER QUADRANT COMPARISON:  None. FINDINGS: Gallbladder: Significant sludge within the gallbladder, but no shadowing stones, no wall thickening and no pericholecystic fluid. Common bile duct: Diameter: 5 mm Liver: No focal lesion identified. Within normal limits in parenchymal echogenicity. Portal vein is patent on color Doppler imaging with normal direction of blood flow towards the liver. IMPRESSION: 1. No acute findings.  No acute cholecystitis or bile duct dilation. 2. Gallbladder sludge without stones. 3. Normal sonographic appearance of the liver. Electronically Signed   By: Amie Portland M.D.   On: 07/08/2018 10:23    Labs:  CBC: Recent Labs    07/13/18 0401 07/14/18 0457 07/15/18 0442 07/16/18 0344  WBC 19.8* 21.1* 25.0*   24.6* 21.8*  HGB 10.2* 9.9* 10.9*   10.8* 9.8*  HCT 34.3* 32.9* 36.8   36.1 31.1*  PLT 438* 476* 448*   454* 460*    COAGS: Recent Labs    07/07/18 1846  07/10/18 0536 07/10/18 1410 07/10/18 1830 07/11/18 0446  INR 1.7*  --   --  1.3*  --   --   APTT 41*   < > 61* 36 69* 100*   < > = values in this interval not displayed.    BMP: Recent Labs    07/12/18 0356 07/14/18 0457 07/15/18 0739 07/16/18 0344  NA 133* 135 136 138  K 4.5 3.5 4.3 4.3  CL 99 102 99 104  CO2 20* GLUCOSE 87 79 125* 118*  BUN 8 5* 9 11  CALCIUM 8.3* 8.1* 8.6* 8.5*  CREATININE 0.72 0.58 0.58 0.56  GFRNONAA >60 >60 >60 >60  GFRAA >60 >60 >60 >60    LIVER FUNCTION TESTS: Recent Labs    07/08/18 0406 07/09/18 0536 07/11/18 1949 07/15/18 0739  BILITOT 0.8 0.8 0.8 0.7  AST 41 44* 36 28  ALT ALKPHOS 131* 134* 119 121  PROT 5.6* 5.9* 5.3* 6.0*  ALBUMIN 1.9* 1.9* 1.6* 1.8*    TUMOR MARKERS: No results for input(s): AFPTM, CEA, CA199, CHROMGRNA in the last 8760 hours.  Assessment and Plan:  abd pain Chronic  N/V Wt loss CT showing perforated bowel Now scheduled for intra abdominal abscess drain placement Risks and  benefits discussed with the patient including bleeding, infection, damage to adjacent structures, bowel perforation/fistula connection, and sepsis.  All of the patient's questions were answered, patient is agreeable to proceed. Consent signed and in chart.   Thank you for this interesting consult.  I greatly enjoyed meeting [name] and look forward to participating in their care.  A copy of this report was sent to the requesting provider on this date.  Electronically Signed: Robet Leu, PA-C 07/16/2018, 10:09 AM   I spent a total of 20 Minutes    in face to face in clinical consultation, greater than 50% of which was counseling/coordinating care for abd abscess drain placement

## 2018-07-17 ENCOUNTER — Encounter (HOSPITAL_COMMUNITY): Payer: Self-pay | Admitting: General Surgery

## 2018-07-17 ENCOUNTER — Encounter (HOSPITAL_COMMUNITY)
Admission: EM | Disposition: A | Discharge status: Hospice | Payer: Self-pay | Source: Home / Self Care | Attending: Internal Medicine

## 2018-07-17 ENCOUNTER — Ambulatory Visit: Payer: Medicare Other

## 2018-07-17 DIAGNOSIS — I10 Essential (primary) hypertension: Secondary | ICD-10-CM

## 2018-07-17 DIAGNOSIS — F329 Major depressive disorder, single episode, unspecified: Secondary | ICD-10-CM

## 2018-07-17 DIAGNOSIS — J449 Chronic obstructive pulmonary disease, unspecified: Secondary | ICD-10-CM

## 2018-07-17 DIAGNOSIS — I739 Peripheral vascular disease, unspecified: Secondary | ICD-10-CM | POA: Diagnosis not present

## 2018-07-17 DIAGNOSIS — E785 Hyperlipidemia, unspecified: Secondary | ICD-10-CM

## 2018-07-17 DIAGNOSIS — Z95820 Peripheral vascular angioplasty status with implants and grafts: Secondary | ICD-10-CM

## 2018-07-17 DIAGNOSIS — I4891 Unspecified atrial fibrillation: Secondary | ICD-10-CM

## 2018-07-17 DIAGNOSIS — Z9181 History of falling: Secondary | ICD-10-CM

## 2018-07-17 DIAGNOSIS — K859 Acute pancreatitis without necrosis or infection, unspecified: Secondary | ICD-10-CM

## 2018-07-17 DIAGNOSIS — M545 Low back pain: Secondary | ICD-10-CM

## 2018-07-17 DIAGNOSIS — F17211 Nicotine dependence, cigarettes, in remission: Secondary | ICD-10-CM

## 2018-07-17 DIAGNOSIS — J9621 Acute and chronic respiratory failure with hypoxia: Secondary | ICD-10-CM

## 2018-07-17 DIAGNOSIS — Z48812 Encounter for surgical aftercare following surgery on the circulatory system: Secondary | ICD-10-CM | POA: Diagnosis not present

## 2018-07-17 DIAGNOSIS — K2950 Unspecified chronic gastritis without bleeding: Secondary | ICD-10-CM

## 2018-07-17 DIAGNOSIS — Z7901 Long term (current) use of anticoagulants: Secondary | ICD-10-CM

## 2018-07-17 DIAGNOSIS — K219 Gastro-esophageal reflux disease without esophagitis: Secondary | ICD-10-CM

## 2018-07-17 DIAGNOSIS — Z9889 Other specified postprocedural states: Secondary | ICD-10-CM

## 2018-07-17 LAB — CBC
HCT: 34.7 % — ABNORMAL LOW (ref 36.0–46.0)
Hemoglobin: 10.5 g/dL — ABNORMAL LOW (ref 12.0–15.0)
MCH: 25.7 pg — ABNORMAL LOW (ref 26.0–34.0)
MCHC: 30.3 g/dL (ref 30.0–36.0)
MCV: 85 fL (ref 80.0–100.0)
Platelets: 358 10*3/uL (ref 150–400)
RBC: 4.08 MIL/uL (ref 3.87–5.11)
RDW: 18.7 % — ABNORMAL HIGH (ref 11.5–15.5)
WBC: 32.1 10*3/uL — ABNORMAL HIGH (ref 4.0–10.5)
nRBC: 0.2 % (ref 0.0–0.2)

## 2018-07-17 LAB — HEPARIN LEVEL (UNFRACTIONATED): Heparin Unfractionated: 0.56 IU/mL (ref 0.30–0.70)

## 2018-07-17 LAB — BASIC METABOLIC PANEL
Anion gap: 11 (ref 5–15)
BUN: 13 mg/dL (ref 8–23)
CO2: 22 mmol/L (ref 22–32)
Calcium: 8.3 mg/dL — ABNORMAL LOW (ref 8.9–10.3)
Chloride: 107 mmol/L (ref 98–111)
Creatinine, Ser: 0.85 mg/dL (ref 0.44–1.00)
GFR calc Af Amer: >60 mL/min (ref >60)
GFR calc non Af Amer: >60 mL/min (ref >60)
Glucose, Bld: 135 mg/dL — ABNORMAL HIGH (ref 70–99)
Potassium: 5.7 mmol/L — ABNORMAL HIGH (ref 3.5–5.1)
Sodium: 140 mmol/L (ref 135–145)

## 2018-07-17 LAB — GLUCOSE, CAPILLARY
Glucose-Capillary: 129 mg/dL — ABNORMAL HIGH (ref 70–99)
Glucose-Capillary: 127 mg/dL — ABNORMAL HIGH (ref 70–99)
Glucose-Capillary: 117 mg/dL — ABNORMAL HIGH (ref 70–99)

## 2018-07-17 SURGERY — ABDOMINAL AORTOGRAM
Anesthesia: LOCAL

## 2018-07-17 MED ORDER — SODIUM CHLORIDE 0.9 % IV BOLUS
250.0000 mL | Freq: Once | INTRAVENOUS | Status: AC
  Administered 2018-07-17: 03:00:00 250 mL via INTRAVENOUS

## 2018-07-17 MED ORDER — ACETAMINOPHEN 10 MG/ML IV SOLN
1000.0000 mg | Freq: Four times a day (QID) | INTRAVENOUS | Status: DC
  Administered 2018-07-17 – 2018-07-18 (×2): 1000 mg via INTRAVENOUS
  Filled 2018-07-17 (×5): qty 100

## 2018-07-17 MED ORDER — PANTOPRAZOLE SODIUM 40 MG IV SOLR
40.0000 mg | INTRAVENOUS | Status: DC
  Administered 2018-07-17 – 2018-08-03 (×18): 40 mg via INTRAVENOUS
  Filled 2018-07-17 (×18): qty 40

## 2018-07-17 MED ORDER — MORPHINE SULFATE (PF) 2 MG/ML IV SOLN
1.0000 mg | INTRAVENOUS | Status: DC | PRN
  Administered 2018-07-17 (×3): 2 mg via INTRAVENOUS
  Filled 2018-07-17 (×3): qty 1

## 2018-07-17 NOTE — Progress Notes (Signed)
Dr. Alanda Slim paged regarding daughter [name]) wanting updates

## 2018-07-17 NOTE — Progress Notes (Signed)
Pt feeling urge to urinate. Bedpan given with no result of urine. Will continue to monitor.

## 2018-07-17 NOTE — Progress Notes (Addendum)
1 Day Post-Op  Subjective: CC: Abdominal pain Patient confused this morning. Orientated to place and self but not time. Nurse reports she is complaining of 10/10 pain but not using PCA despite multiple attempts to show her how to use this. No N/V. Pulled NG tube out overnight. No output from colostomy yet or flatus.    Objective: Vital signs in last 24 hours: Temp:  [97.6 F (36.4 C)-98.5 F (36.9 C)] 98.2 F (36.8 C) (04/17 0503) Pulse Rate:  [63-86] 86 (04/17 0503) Resp:  [10-24] 21 (04/17 0503) BP: (117-153)/(57-79) 153/69 (04/17 0503) SpO2:  [86 %-99 %] 96 % (04/17 0503) Weight:  [62.1 kg] 62.1 kg (04/17 0501) Last BM Date: 07/13/18  Intake/Output from previous day: 04/16 0701 - 04/17 0700 In: 3379.9 [I.V.:2879.9; IV Piggyback:500] Out: 2805 [Urine:2500; Drains:205; Blood:100] Intake/Output this shift: Total I/O In: 75 [I.V.:75] Out: -   PE: Gen: Awake. NAD Heart: Regular rate. 85-90 while in the room Lungs: Normal effort and rate Abd: Soft, ND. Hypoactive bowel sounds. Stoma red and appears viable. No air or stool in colostomy bag. Midline wound appears clean with healthy base of granulation tissue. No false bottom or tracking. Left mid JP drain with 80cc output overnight, bloody serosanguinous fluid this AM.  Left lower JP drain with 80cc output overnight, bloody serosanguinous fluid this AM. Wound packed and redressed. See pictures below.        Lab Results:  Recent Labs    07/16/18 0344 07/17/18 0259  WBC 21.8* 32.1*  HGB 9.8* 10.5*  HCT 31.1* 34.7*  PLT 460* 358   BMET Recent Labs    07/16/18 0344 07/17/18 0259  NA 138 140  K 4.3 5.7*  CL 104 107  CO2 25 22  GLUCOSE 118* 135*  BUN 11 13  CREATININE 0.56 0.85  CALCIUM 8.5* 8.3*   PT/INR No results for input(s): LABPROT, INR in the last 72 hours. CMP     Component Value Date/Time   NA 140 07/17/2018 0259   NA 142 05/22/2018 1039   K 5.7 (H) 07/17/2018 0259   CL 107 07/17/2018 0259   CO2 22 07/17/2018 0259   GLUCOSE 135 (H) 07/17/2018 0259   BUN 13 07/17/2018 0259   BUN 10 05/22/2018 1039   CREATININE 0.85 07/17/2018 0259   CREATININE 0.72 08/07/2012 1016   CALCIUM 8.3 (L) 07/17/2018 0259   PROT 6.0 (L) 07/15/2018 0739   PROT 6.8 05/22/2018 1039   ALBUMIN 1.8 (L) 07/15/2018 0739   ALBUMIN 3.6 (L) 05/22/2018 1039   AST 28 07/15/2018 0739   ALT 13 07/15/2018 0739   ALKPHOS 121 07/15/2018 0739   BILITOT 0.7 07/15/2018 0739   BILITOT 0.3 05/22/2018 1039   GFRNONAA >60 07/17/2018 0259   GFRNONAA 88 08/07/2012 1016   GFRAA >60 07/17/2018 0259   GFRAA >89 08/07/2012 1016   Lipase     Component Value Date/Time   LIPASE 65 (H) 07/08/2018 1325       Studies/Results: Ct Abdomen Pelvis W Contrast  Result Date: 07/15/2018 CLINICAL DATA:  73 year old female with history of nausea and inability to eat for the past 6 months. 60 pounds of weight loss. EXAM: CT ABDOMEN AND PELVIS WITH CONTRAST TECHNIQUE: Multidetector CT imaging of the abdomen and pelvis was performed using the standard protocol following bolus administration of intravenous contrast. CONTRAST:  100mL ISOVUE-300 IOPAMIDOL (ISOVUE-300) INJECTION 61% COMPARISON:  CT the abdomen and pelvis 07/09/2018. FINDINGS: Lower chest: Areas of scarring in the inferior segment of  the lingula and posterior aspect of the right lower lobe. Aortic atherosclerosis. Calcifications of the mitral annulus. Hepatobiliary: Subcentimeter low-attenuation lesion in segment 4A of the liver, too small to characterize, but statistically likely to represent a tiny cyst. No other suspicious hepatic lesions. No intra or extrahepatic biliary ductal dilatation. Amorphous intermediate attenuation material lying dependently in the gallbladder, new compared to the prior study, favored to reflect vicarious excretion of contrast related to recent CT examination. Pancreas: Low-attenuation lesion in the tail of the pancreas, slightly decreased compared to  the prior study, currently measuring 2.4 x 2.8 cm (axial image 26 of series 3), with persistent surrounding inflammatory changes. No other pancreatic mass. No pancreatic ductal dilatation. 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 Adrenals/Urinary Tract: There is a small amount of subcapsular fluid associated with the anterior and lateral aspect of the upper and interpolar region of the left kidney, which appears similar to the prior study and has likely dissected posteriorly from the pancreatic fluid collection mentioned above. Mild multifocal cortical thinning in the right kidney, most severe in the lower pole. 5 mm nonobstructive calculus in the interpolar collecting system of the right kidney. No hydroureteronephrosis. Urinary bladder is normal in appearance. Bilateral adrenal glands are normal in appearance. Stomach/Bowel: Normal appearance of the stomach. No pathologic dilatation of small bowel or colon. In the region of the descending colon there is again some mural thickening proximally. In the mid to distal descending colon there is discontinuity of the anterior colonic wall with gas extending anteriorly into the adjacent fatty soft tissues (presumably omentum), indicative of frank perforation. Normal appendix. Vascular/Lymphatic: Aortic atherosclerosis with postoperative changes of aortobifemoral bypass procedure. 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 Superior mesenteric artery appears grossly patent. IMA is not confidently identified. No lymphadenopathy noted in the abdomen or pelvis. Reproductive: Status post hysterectomy. Right ovary is atrophic. 3.4 x 2.5 x 3.1 cm low-attenuation lesion in the left adnexa is likely to represent a cyst. Other: Despite the apparent perforation of the descending colon and gas which appears to be contained within a  portion of the omentum in the left side of the abdomen, there is no other definite pneumoperitoneum. No significant volume of ascites. Musculoskeletal: There are no aggressive appearing lytic or blastic lesions noted in the visualized portions of the skeleton. Status post PLIF from L3-L5 with interbody grafts at L3-L4 and L4-L5. IMPRESSION: 1. Compared to the prior examination there is now frank perforation of the descending colon which appears to be predominantly contained within a portion of the omentum in the left side of the abdomen. Surgical consultation is strongly recommended. 2. 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 A small portion of this collection appears to have dissected posteriorly now involving the subcapsular space associated with the upper and interpolar regions of the left kidney. 3. 5 mm nonobstructive calculus in the interpolar collecting system of the right kidney. 4. Additional incidental findings, as above, similar to the prior examination. Critical Value/emergent results were called by telephone at the time of interpretation on 07/15/2018 at 3:46 pm to Dr. Candelaria Stagers , who verbally acknowledged these results. Electronically Signed   By: Trudie Reed M.D.   On: 07/15/2018 15:47   Ct Image Guided Drainage By Percutaneous  Catheter  Result Date: 07/16/2018 INDICATION: 73 year old with perforation of the descending colon. There is a large extraluminal gas collection that communicates with the descending colon. General Surgery requested placement of a drainage catheter within the extraluminal gas collection. EXAM: CT-GUIDED DRAIN PLACEMENT WITHIN THE PERICOLONIC COLLECTION MEDICATIONS: The patient is currently admitted to the hospital and receiving intravenous antibiotics. ANESTHESIA/SEDATION: 2.0 mg IV Versed 75 mcg IV Fentanyl  Moderate Sedation Time:  31 minutes The patient was continuously monitored during the procedure by the interventional radiology nurse under my direct supervision. COMPLICATIONS: None immediate. TECHNIQUE: Informed written consent was obtained from the patient after a thorough discussion of the procedural risks, benefits and alternatives. All questions were addressed. Maximal Sterile Barrier Technique was utilized including caps, mask, sterile gowns, sterile gloves, sterile drape, hand hygiene and skin antiseptic. A timeout was performed prior to the initiation of the procedure. PROCEDURE: CT images through the abdomen were obtained. The large gas collection communicating the descending colon in the left anterior abdomen was targeted. The left upper abdomen was prepped with chlorhexidine and sterile field was created. Skin and soft tissues anesthetized with 1% lidocaine. 18 gauge trocar needle was directed into the extraluminal collection. Air was coming out of the needle. Stiff Amplatz wire preferentially went into the adjacent colon. As a result, this access was removed. Skin was anesthetized slightly more medial and cephalad. Needle was directed into the extraluminal gas collection with CT guidance. Again, the wire preferentially went into the colon. Tract was dilated to accommodate a 12 [place] multipurpose drain. Initially, the tube was extending into the colon. The tube was pulled back so that the pigtail was outside the colon lumen. Catheter was sutured to skin and attached to a gravity bag. FINDINGS: Again noted is a contained perforation involving the anterior wall of the descending colon. The size the anterior colonic wall defect measures close to 3 cm. Again noted is contained gas extending anterior and cephalad to the descending colon. Drainage catheter was placed within the extraluminal collection. Drain was pulled out of the colon lumen and positioned just anterior to the colon. IMPRESSION: CT-guided  placement of a drainage catheter within the large extraluminal gas collection in the left abdomen. Findings are compatible with contained perforation of the descending colon. Electronically Signed   By: Richarda Overlie M.D.   On: 07/16/2018 14:42    Anti-infectives: Anti-infectives (From admission, onward)   Start     Dose/Rate Route Frequency Ordered Stop   07/15/18 1700  meropenem (MERREM) 1 g in sodium chloride 0.9 % 100 mL IVPB     1 g 200 mL/hr over 30 Minutes Intravenous Every 8 hours 07/15/18 1620     07/08/18 2300  vancomycin (VANCOCIN) 1,500 mg in sodium chloride 0.9 % 500 mL IVPB  Status:  Discontinued     1,500 mg 250 mL/hr over 120 Minutes Intravenous  Once 07/07/18 2128 07/07/18 2129   07/08/18 2300  vancomycin (VANCOCIN) 1,500 mg in sodium chloride 0.9 % 500 mL IVPB  Status:  Discontinued     1,500 mg 250 mL/hr over 120 Minutes Intravenous Every 24 hours 07/07/18 2129 07/08/18 0726   07/08/18 0600  ceFEPIme (MAXIPIME) 2 g in sodium chloride 0.9 % 100 mL IVPB  Status:  Discontinued     2 g 200 mL/hr over 30 Minutes Intravenous Every 8 hours 07/07/18 2124 07/13/18 1333   07/07/18 2130  vancomycin (VANCOCIN) 1,500 mg in sodium chloride 0.9 % 500 mL IVPB     1,500  mg 250 mL/hr over 120 Minutes Intravenous  Once 07/07/18 2123 07/08/18 0028   07/07/18 2115  vancomycin (VANCOCIN) IVPB 1000 mg/200 mL premix  Status:  Discontinued     1,000 mg 200 mL/hr over 60 Minutes Intravenous  Once 07/07/18 2110 07/07/18 2128   07/07/18 2115  ceFEPIme (MAXIPIME) 2 g in sodium chloride 0.9 % 100 mL IVPB     2 g 200 mL/hr over 30 Minutes Intravenous  Once 07/07/18 2110 07/07/18 2203       Assessment/Plan A. 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 Derrell Lolling, 4/16 - POD 1 - IV abx per ID - BID WTD dressing changes - WOC consult for new colostomy  - WBC 32.1 this AM. Afebrile. Repeat CBC in AM - High liklihood for post-op ileus. No bowel function throughout colostomy. NPO - Patient pulled NG tube overnight. Re-insert if develops N/V or abdominal distension.  - Patient not using PCA. Will d/c and switch to PRN morphine, discussed with nurse who will check on patient frequently today  - Mobilize as tolerated. IS/pulm toliet.   FEN- NPO, IVF, K 5.7 VTE -SCDs, heparin  ID -Meropenem 4/15 >> per ID, WBC 32.1, afebrile Foley - Removed POD 1 Follow up -Dr. Derrell Lolling    LOS: 10 days    Jacinto Halim , [facility] Surgery 07/17/2018, 9:15 AM Pager: [phone]

## 2018-07-17 NOTE — Consult Note (Signed)
WOC Nurse ostomy consult note Stoma type/location: RUQ, descending colostomy Stomal assessment/size:  Viewed through pouch, pink, moist Peristomal assessment: NA  Treatment options for stomal/peristomal skin: NA Output none Ostomy pouching: 2pc.2 3/4" system in place from the OR.   Education provided: none Education booklet and information left in the room Patient is unable to keep her eyes open and barely responds to questions without closing eyes. PCA in place. She does report she lives at home alone. Reports a sister that helps her.  Enrolled patient in [place] DC program:/No  WOC Nurse will follow along with you for continued support with ostomy teaching and care Dreux Mcgroarty [facility] MSN, RN, [place], CNS, [place] [phone]

## 2018-07-17 NOTE — Progress Notes (Signed)
ANTICOAGULATION CONSULT NOTE   Pharmacy Consult for heparin  Indication: atrial fibrillation  No Known Allergies  Patient Measurements: Height: 5\' 7"  (170.2 cm) Weight: 136 lb 14.5 oz (62.1 kg) IBW/kg (Calculated) : 61.6  HEPARIN DW (KG): 62.3   Vital Signs: Temp: 98.3 F (36.8 C) (04/17 1140) Temp Source: Oral (04/17 1140) BP: 159/71 (04/17 1140) Pulse Rate: 93 (04/17 1140)  Labs: Recent Labs    07/15/18 0442 07/15/18 0739 07/16/18 0344 07/17/18 0259  HGB 10.9*  10.8*  --  9.8* 10.5*  HCT 36.8  36.1  --  31.1* 34.7*  PLT 448*  454*  --  460* 358  HEPARINUNFRC 0.61  --  0.74*  --   CREATININE  --  0.58 0.56 0.85    Estimated Creatinine Clearance: 59 mL/min (by C-G formula based on SCr of 0.85 mg/dL).   Assessment: 73 y.o. female with h/o Afib, Eliquis on hold, for heparin. Last apixaban dose was 10am on 07/08/18.Marland Kitchen Heparin level and APTT began correlating ~ 4/10, thus monitoring heparin via heparin levels.   S/p upper endoscopy 07/14/18 found Severe ulcerative gastritis most consistent ischemia, biopsied, results pending. CT  showed perforation of descending colon. , plan for s/p perc drain placement by IR , expl lap and left sigmoid colectomy with colostomy on 07/16/18.   Heparin drip, no bolus to resume on 07/17/18 at 0800  per surgery's order.  However RN reports IV infusion pump malfunction, thus Heparin not resumed until 11:42 AM No bleeding noted per RN. Hgb 10.5 low/stable, pltc 358 stable.  Goal of Therapy:  Heparin level 0.3-0.7 units/ml Monitor platelets by anticoagulation protocol: Yes   Plan:  Heparin gtt resumed today at 1350 units/hr, no bolus.  Check 8 hour heparin level. Daily heparin level, CBC, s/s bleeding F/u for transtion back to apixaban after procedures done or stable.  Noah Delaine, RPh Clinical Pharmacist Please check AMION for all [facility] Pharmacy numbers 07/17/2018 3:49 PM

## 2018-07-17 NOTE — Care Management Important Message (Signed)
Important Message  Patient Details  Name: [name] MRN: [id number] Date of Birth: 1945/04/14   Medicare Important Message Given:  Yes    Dorena Bodo 07/17/2018, 3:13 PM

## 2018-07-17 NOTE — Plan of Care (Signed)
  Problem: Clinical Measurements: Goal: Respiratory complications will improve Outcome: Progressing Goal: Cardiovascular complication will be avoided Outcome: Progressing   Problem: Health Behavior/Discharge Planning: Goal: Ability to manage health-related needs will improve Outcome: Not Progressing   Problem: Clinical Measurements: Goal: Will remain free from infection Outcome: Not Progressing   

## 2018-07-17 NOTE — Progress Notes (Signed)
Patient pulled out NG tube. Patient was asked why she pulled it out and she said, "I don't know". Patient could tell this RN her name, birth date and that she was at [facility]. Patient could not state why she was in the hospital.   Notified on call Dr. Andrey Campanile, CCS to let him know about NG tube. NG tube does not need to be out back in. Will continue to monitor.

## 2018-07-17 NOTE — Progress Notes (Signed)
PROGRESS NOTE  [name]:[id number] DOB: 09/22/1945 DOA: 07/07/2018 PCP: Junie Spencer, [name]   LOS: 10 days   Brief Narrative / Interim history:  73 year old female with history of chronic N/V/abdominal pain, A. fib, PVD status post right femoral thrombectomy on 05/31/2018, hypertension and chronic back pain presenting with intractable nausea, vomiting, abdominal pain, new onset of diarrhea and about 30 pounds weight loss in the last several months.   She was admitted to [facility] 5 days ago with somnolence and a diagnosis of sepsis. She has had a prior aortobifem by Dr Hart Rochester in 2002 and had thrombectomy of the right limb of this by Dr Randie Heinz 3/20. 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 Her left internal iliac artery was patent at that point.   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 Also there is a 3.9 x 1.8 cm and 2.6 x 1.1 cm fluid collection in right pelvis/groin thought to be seroma from prior surgery.   Per H&P, interventional radiology at [facility] was consulted and consideration was being given for stenting her SMA which has about a (30-50% stenosis) to improve mesenteric blood flow.   Patient was transferred to [facility] for further management.  Patient was also treated with cefepime for presumed pneumonia.  Gastroenterology and vascular surgery consulted.  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  Biopsies taken and pending.  She also had flexible sigmoidoscopy that showed only a 7 mm sessile polyp in the  rectum but poor prep.  On 4/15, and continued to have significant leukocytosis, mild temperature and abdominal tenderness.  This raised concern about intra-abdominal occult infection especially with the fluid collections noted on CT abdomen.  Infectious disease consulted and started patient on meropenem.  IR consulted and recommended getting repeat CT abdomen and pelvis which showed frank perforation of the descending colon appears to be contained within a portion of the omentum, or occluded celiac axis, slightly decreased pancreatic tail lesion but no previously noted fluid collections.  General surgery and IR consulted.  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 Derrell Lolling on 4/16  Subjective: No major events overnight of this morning.  No complaints this morning.  Surgical pain fairly controlled.  She denies chest pain, dyspnea or nausea.  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 Derrell Lolling, 4/16 -IR and general surgery managing. -Vascular  surgery on board as well-plan for angiogram down the road -Pain control per surgery -Globin stable. -PRN Zofran  Leukocytosis: Combination of stress and the above -Continue meropenem 4/15--Per ID.  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  Likely from occluded celiac axis -Continue PPI  Significant weight loss/moderate protein calorie malnutrition: Likely due to poor p.o. intake from the above. -Appreciate nutrition input  History of COPD: Stable -Continue DuoNeb  Peripheral vascular disease with dry gangrene -Continue home statin -Outpatient vascular surgery follow-up  Essential hypertension: Normotensive. -Continue home meds  Chronic atrial fibrillation: Not in RVR.  [place] vas score 6.  On Eliquis and metoprolol at home. -Heparin and metoprolol  Chronic pain: -Continue home medications (Lyrica, Zanaflex).  Tobacco use disorder: -Cessation counseling -Nicotine patch  Hypokalemia-likely due to potassium supplementation -Discontinue potassium and recheck  Scheduled Meds:  atorvastatin  40 mg Oral Daily   Chlorhexidine Gluconate Cloth  6 each Topical Q0600   feeding supplement (ENSURE ENLIVE)  237 mL Oral TID BM   folic acid  1 mg Oral Daily   lidocaine  1 patch Transdermal Q24H   metoprolol tartrate  75 mg Oral BID   multivitamin with minerals  1 tablet Oral Daily   nicotine  21 mg Transdermal Daily   pantoprazole (PROTONIX) IV  40 mg Intravenous Q24H   pregabalin  200 mg Oral TID   thiamine  100 mg Oral Daily   Or   thiamine  100 mg Intravenous Daily   Continuous Infusions:  sodium chloride Stopped (07/16/18 0842)   sodium chloride 75 mL/hr at 07/16/18 2041   heparin     meropenem (MERREM) IV 1 g (07/17/18 0629)   methocarbamol (ROBAXIN) IV     PRN Meds:.sodium chloride, acetaminophen **OR** acetaminophen, alum & mag hydroxide-simeth, hydrALAZINE, ipratropium-albuterol, labetalol, methocarbamol (ROBAXIN) IV, morphine injection, ondansetron (ZOFRAN) IV, oxyCODONE, senna-docusate, tiZANidine  DVT prophylaxis: On heparin drip for atrial fibrillation Code Status: Full code Family Communication: None at bedside.  Updated patient's daughter over the  phone. Disposition Plan: Remains inpatient  Consultants:   Vascular surgery  Gastroenterology  Infectious disease  Interventional radiology  General surgery  Procedures:   EGD on 4/14  Flex sigmoidoscopy on 4/14  IR drain placement on 4/16 which showed a large left colon defect, 4/16  Ex Lap, LOA, Left sigmoid colectomy, descending colostomy (Hartmann's), Dr. Derrell Lollingamirez, 4/16  Antimicrobials:  Vancomycin on 4/7  Cefepime 4/7-4/12  Meropenem 4/15--  Objective: Vitals:   07/17/18 0338 07/17/18 0340 07/17/18 0501 07/17/18 0503  BP:    (!) 153/69  Pulse:    86  Resp: 19 19  (!) 21  Temp:    98.2 F (36.8 C)  TempSrc:    Oral  SpO2: 95% 95%  96%  Weight:   62.1 kg   Height:        Intake/Output Summary (Last 24 hours) at 07/17/2018 1135 Last data filed at 07/17/2018 0800 Gross per 24 hour  Intake 2669.2 ml  Output 1505 ml  Net 1164.2 ml   Filed Weights   07/15/18 0515 07/16/18 0508 07/17/18 0501  Weight: 63 kg 62.4 kg 62.1 kg    Examination:  GENERAL: Appears well. No acute distress.  EYES - vision grossly intact. Sclera anicteric.  NOSE- no gross deformity or drainage MOUTH - no oral lesions noted THROAT- no swelling or erythema LUNGS:  No IWOB.  Fair air movement bilaterally. HEART:   RRR. Heart sounds normal. ABD: Bowel sounds present. Soft.  Drains in place with minimal serosanguineous fluid.  Surgical wound dressing clean and dry.  Colostomy site appears clean.  Colostomy bag empty. MSK/EXT: Moves extremities. No obvious deformity. SKIN: no apparent skin lesion.  NEURO: Awake, alert and oriented appropriately.  No gross deficit.  PSYCH: Calm. Normal affect.    Data Reviewed: I have independently reviewed following labs and imaging studies  CBC: Recent Labs  Lab 07/13/18 0401 07/14/18 0457 07/15/18 0442 07/16/18 0344 07/17/18 0259  WBC 19.8* 21.1* 25.0*   24.6* 21.8* 32.1*  NEUTROABS  --   --  21.9*  --   --   HGB 10.2* 9.9* 10.9*    10.8* 9.8* 10.5*  HCT 34.3* 32.9* 36.8   36.1 31.1* 34.7*  MCV 83.9 81.8 84.0   82.6 81.8 85.0  PLT 438* 476* 448*   454* 460* 358   Basic Metabolic Panel: Recent Labs  Lab 07/11/18 1949 07/12/18 0356 07/14/18 0457 07/15/18 0739 07/16/18 0344 07/17/18 0259  NA 130* 133* 135 136 138 140  K 5.0 4.5 3.5 4.3 4.3 5.7*  CL 98 99 102 99 104 107  CO2 21* 20* GLUCOSE 116* 87 79 125* 118* 135*  BUN 7* 8 5* CREATININE 0.62 0.72 0.58 0.58 0.56 0.85  CALCIUM 8.1* 8.3* 8.1* 8.6* 8.5* 8.3*  MG 1.5* 1.7  --   --   --   --    GFR: Estimated Creatinine Clearance: 59 mL/min (by C-G formula based on SCr of 0.85 mg/dL). Liver Function Tests: Recent Labs  Lab 07/11/18 1949 07/15/18 0739  AST 36 28  ALT 14 13  ALKPHOS 119 121  BILITOT 0.8 0.7  PROT 5.3* 6.0*  ALBUMIN 1.6* 1.8*   No results for input(s): LIPASE, AMYLASE in the last 168 hours. No results for input(s): AMMONIA in the last 168 hours. Coagulation Profile: Recent Labs  Lab 07/10/18 1410  INR 1.3*   Cardiac Enzymes: No results for input(s): CKTOTAL, CKMB, CKMBINDEX, TROPONINI in the last 168 hours. BNP (last 3 results) No results for input(s): PROBNP in the last 8760 hours. HbA1C: No results for input(s): HGBA1C in the last 72 hours. CBG: Recent Labs  Lab 07/11/18 1715 07/15/18 2053 07/16/18 0608 07/17/18 0616  GLUCAP 124* 133* 98 129*   Lipid Profile: No results for input(s): CHOL, HDL, LDLCALC, TRIG, CHOLHDL, LDLDIRECT in the last 72 hours. Thyroid Function Tests: No results for input(s): TSH, T4TOTAL, FREET4, T3FREE, THYROIDAB in the last 72 hours. Anemia Panel: No results for input(s): VITAMINB12, FOLATE, FERRITIN, TIBC, IRON, RETICCTPCT in the last 72 hours. Urine analysis:    Component Value Date/Time   COLORURINE AMBER (A) 07/07/2018 1826   APPEARANCEUR HAZY (A) 07/07/2018 1826   LABSPEC 1.014 07/07/2018 1826   PHURINE 5.0 07/07/2018 1826   GLUCOSEU 50 (A) 07/07/2018 1826    HGBUR NEGATIVE 07/07/2018 1826   BILIRUBINUR NEGATIVE 07/07/2018 1826   KETONESUR NEGATIVE 07/07/2018 1826   PROTEINUR NEGATIVE 07/07/2018 1826   NITRITE NEGATIVE 07/07/2018 1826   LEUKOCYTESUR NEGATIVE 07/07/2018 1826   Sepsis Labs: Invalid input(s): PROCALCITONIN, LACTICIDVEN  Recent Results (from the past 240 hour(s))  Blood culture (routine x 2)     Status: None   Collection Time: 07/07/18  8:02 PM  Result Value Ref Range Status   Specimen Description BLOOD RIGHT ANTECUBITAL  Final   Special Requests   Final    BOTTLES DRAWN AEROBIC AND ANAEROBIC Blood Culture adequate volume   Culture  Final    NO GROWTH 5 DAYS Performed at [facility], [address], [place], [place] [address]    Report Status 07/12/2018 FINAL  Final  Blood culture (routine x 2)     Status: None   Collection Time: 07/07/18  8:07 PM  Result Value Ref Range Status   Specimen Description BLOOD RIGHT HAND  Final   Special Requests   Final    BOTTLES DRAWN AEROBIC AND ANAEROBIC Blood Culture adequate volume   Culture   Final    NO GROWTH 5 DAYS Performed at [facility], [address], [place], [place] [address]    Report Status 07/12/2018 FINAL  Final  MRSA PCR Screening     Status: None   Collection Time: 07/08/18 12:37 AM  Result Value Ref Range Status   MRSA by PCR NEGATIVE NEGATIVE Final    Comment:        The GeneXpert MRSA Assay (FDA approved for NASAL specimens only), is one component of a comprehensive MRSA colonization surveillance program. It is not intended to diagnose MRSA infection nor to guide or monitor treatment for MRSA infections. Performed at [facility], [address], [place], [place] [address]       Radiology Studies: Ct Image Guided Drainage By Percutaneous Catheter  Result Date: 07/16/2018 INDICATION: 73 year old with perforation of the descending colon. There is a large extraluminal gas collection that communicates with the descending colon. General Surgery  requested placement of a drainage catheter within the extraluminal gas collection. EXAM: CT-GUIDED DRAIN PLACEMENT WITHIN THE PERICOLONIC COLLECTION MEDICATIONS: The patient is currently admitted to the hospital and receiving intravenous antibiotics. ANESTHESIA/SEDATION: 2.0 mg IV Versed 75 mcg IV Fentanyl Moderate Sedation Time:  31 minutes The patient was continuously monitored during the procedure by the interventional radiology nurse under my direct supervision. COMPLICATIONS: None immediate. TECHNIQUE: Informed written consent was obtained from the patient after a thorough discussion of the procedural risks, benefits and alternatives. All questions were addressed. Maximal Sterile Barrier Technique was utilized including caps, mask, sterile gowns, sterile gloves, sterile drape, hand hygiene and skin antiseptic. A timeout was performed prior to the initiation of the procedure. PROCEDURE: CT images through the abdomen were obtained. The large gas collection communicating the descending colon in the left anterior abdomen was targeted. The left upper abdomen was prepped with chlorhexidine and sterile field was created. Skin and soft tissues anesthetized with 1% lidocaine. 18 gauge trocar needle was directed into the extraluminal collection. Air was coming out of the needle. Stiff Amplatz wire preferentially went into the adjacent colon. As a result, this access was removed. Skin was anesthetized slightly more medial and cephalad. Needle was directed into the extraluminal gas collection with CT guidance. Again, the wire preferentially went into the colon. Tract was dilated to accommodate a 12 [place] multipurpose drain. Initially, the tube was extending into the colon. The tube was pulled back so that the pigtail was outside the colon lumen. Catheter was sutured to skin and attached to a gravity bag. FINDINGS: Again noted is a contained perforation involving the anterior wall of the descending colon. The size the  anterior colonic wall defect measures close to 3 cm. Again noted is contained gas extending anterior and cephalad to the descending colon. Drainage catheter was placed within the extraluminal collection. Drain was pulled out of the colon lumen and positioned just anterior to the colon. IMPRESSION: CT-guided placement of a drainage catheter within the large extraluminal gas collection in the left abdomen. Findings are  compatible with contained perforation of the descending colon. Electronically Signed   By: Richarda Overlie M.D.   On: 07/16/2018 14:42    Clema Skousen T. [facility] Pager [phone]  If 7PM-7AM, please contact night-coverage [email or web address] Password [facility] 07/17/2018, 11:35 AM

## 2018-07-17 NOTE — Progress Notes (Signed)
[facility]  Subjective  - Fairly comfortable.   Objective (!) 153/69 86 98.2 F (36.8 C) (Oral) (!) 21 96%  Intake/Output Summary (Last 24 hours) at 07/17/2018 0907 Last data filed at 07/17/2018 0800 Gross per 24 hour  Intake 2669.2 ml  Output 1505 ml  Net 1164.2 ml   Colostomy in place with abdominal drains. B LE with intact motor, feet warm with intact sensation    Assessment/Planning: Pending stabilization we will plan angiogram for confirmation of perfusion to the GI system.    [name] 07/17/2018 9:07 AM --  Laboratory Lab Results: Recent Labs    07/16/18 0344 07/17/18 0259  WBC 21.8* 32.1*  HGB 9.8* 10.5*  HCT 31.1* 34.7*  PLT 460* 358   BMET Recent Labs    07/16/18 0344 07/17/18 0259  NA 138 140  K 4.3 5.7*  CL 104 107  CO2 25 22  GLUCOSE 118* 135*  BUN 11 13  CREATININE 0.56 0.85  CALCIUM 8.5* 8.3*    COAG Lab Results  Component Value Date   INR 1.3 (H) 07/10/2018   INR 1.7 (H) 07/07/2018   INR 1.05 05/17/2009   No results found for: PTT

## 2018-07-17 NOTE — Progress Notes (Signed)
Morphine PCA pump d/c'd. Morphine syringe 31ml wasted in waste bin with [employer]

## 2018-07-17 NOTE — Progress Notes (Signed)
ANTICOAGULATION CONSULT NOTE   Pharmacy Consult for heparin  Indication: atrial fibrillation  No Known Allergies  Patient Measurements: Height: 5\' 7"  (170.2 cm) Weight: 136 lb 14.5 oz (62.1 kg) IBW/kg (Calculated) : 61.6  HEPARIN DW (KG): 62.3   Vital Signs: Temp: 98 F (36.7 C) (04/17 1947) Temp Source: Oral (04/17 1947) BP: 172/76 (04/17 1947) Pulse Rate: 93 (04/17 1140)  Labs: Recent Labs    07/15/18 0442 07/15/18 0739 07/16/18 0344 07/17/18 0259 07/17/18 2019  HGB 10.9*  10.8*  --  9.8* 10.5*  --   HCT 36.8  36.1  --  31.1* 34.7*  --   PLT 448*  454*  --  460* 358  --   HEPARINUNFRC 0.61  --  0.74*  --  0.56  CREATININE  --  0.58 0.56 0.85  --     Estimated Creatinine Clearance: 59 mL/min (by C-G formula based on SCr of 0.85 mg/dL).   Assessment: 73 y.o. female with h/o Afib, Eliquis on hold, for heparin. Last apixaban dose was 10am on 07/08/18.Marland Kitchen Heparin level and APTT began correlating ~ 4/10, thus monitoring heparin via heparin levels.   S/p upper endoscopy 07/14/18 found Severe ulcerative gastritis most consistent ischemia, biopsied, results pending. CT  showed perforation of descending colon. , plan for s/p perc drain placement by IR , expl lap and left sigmoid colectomy with colostomy on 07/16/18.   Heparin drip, no bolus to resume on 07/17/18 at 0800  per surgery's order.  However RN reports IV infusion pump malfunction, thus Heparin not resumed until 11:42 AM  Heparin level this evening is at goal (0.56), no bleeding or IV issues noted.   Goal of Therapy:  Heparin level 0.3-0.7 units/ml Monitor platelets by anticoagulation protocol: Yes   Plan:  Heparin gtt resumed today at 1350 units/hr, will continue  Daily heparin level, CBC, s/s bleeding F/u for transtion back to apixaban after procedures done or stable.  Sheppard Coil PharmD., BCPS Clinical Pharmacist 07/17/2018 9:00 PM

## 2018-07-17 NOTE — Progress Notes (Signed)
Physical Therapy Treatment Patient Details Name: Danielle FilbertLinda S Mcglinn MRN: [id number] DOB: 10-05-1945 Today's Date: 07/17/2018    History of Present Illness  Danielle FilbertLinda S Frisbie is a 73 y.o. female with medical history significant of chronic back pain, GERD, hyperlipidemia, hypertension, left knee osteoarthritis, palpitations, PVD h/o Aortofem bypass, s/p RLE thromboembolectomy and R common emoral artery patch angioplasty 05/31/18, history of tobacco use who presents to ER due to fall at home.  patient now s/p EXPLORATORY LAPAROTOMY, lysis of adhesions X 45 minutes LEFT SIGMOID COLECTOMY, SPLENIC FLEXOR MOBILIZATION,DESCENDING COLOSTOMY on 4/16.    PT Comments    Patient significantly  limited this visit by pain, reporting 10/10. Not tolerating sitting EOB despite encouragement and physical assistance. Session focused on bed level therex, pt appears with altered cognition today unlikely to have cary over of therex. Will cont to follow and progress as tolerated.     Follow Up Recommendations  SNF(HHPT if progresses well )     Equipment Recommendations  Other (comment)(TBD )    Recommendations for Other Services       Precautions / Restrictions Restrictions Weight Bearing Restrictions: No    Mobility  Bed Mobility Overal bed mobility: Needs Assistance Bed Mobility: Supine to Sit     Supine to sit: Min assist     General bed mobility comments: Patient too painful to tolerate sitting EOB today despite cues  Transfers                    Ambulation/Gait                 Stairs             Wheelchair Mobility    Modified Rankin (Stroke Patients Only)       Balance                                            Cognition     Overall Cognitive Status: Impaired/Different from baseline                                 General Comments: patient unable to follow cues well, reporting high pain      Exercises General Exercises  - Lower Extremity Ankle Circles/Pumps: 20 reps Quad Sets: 20 reps Gluteal Sets: 20 reps Heel Slides: 20 reps Hip ABduction/ADduction: 20 reps    General Comments        Pertinent Vitals/Pain Pain Assessment: 0-10 Pain Score: 10-Worst pain ever Faces Pain Scale: Hurts whole lot Pain Location: stomach Pain Descriptors / Indicators: Burning;Discomfort Pain Intervention(s): Limited activity within patient's tolerance    Home Living                      Prior Function            PT Goals (current goals can now be found in the care plan section) Acute Rehab PT Goals PT Goal Formulation: With patient Time For Goal Achievement: 07/26/18 Potential to Achieve Goals: Good Progress towards PT goals: Progressing toward goals    Frequency    Min 3X/week      PT Plan Discharge plan needs to be updated    Co-evaluation              AM-PAC PT "6 Clicks" Mobility  Outcome Measure  Help needed turning from your back to your side while in a flat bed without using bedrails?: A Lot Help needed moving from lying on your back to sitting on the side of a flat bed without using bedrails?: A Lot Help needed moving to and from a bed to a chair (including a wheelchair)?: Total Help needed standing up from a chair using your arms (e.g., wheelchair or bedside chair)?: Total Help needed to walk in hospital room?: Total Help needed climbing 3-5 steps with a railing? : Total 6 Click Score: 8    End of Session   Activity Tolerance: Patient limited by pain Patient left: in bed;with call bell/phone within reach Nurse Communication: Mobility status PT Visit Diagnosis: Unsteadiness on feet (R26.81);Other abnormalities of gait and mobility (R26.89);Difficulty in walking, not elsewhere classified (R26.2)     Time: 0037-0488 PT Time Calculation (min) (ACUTE ONLY): 20 min  Charges:  $Therapeutic Exercise: 8-22 mins                     Etta Grandchild, PT, DPT Acute  Rehabilitation Services Pager: [phone] Office: [phone]     Etta Grandchild 07/17/2018, 10:46 AM

## 2018-07-17 NOTE — Progress Notes (Signed)
Pt confused this am to month, age, and reason being here. Oriented to place. States" I have not used the PCA". Pain 10-10 numerical scale used. Educated pt in regards to pushing PCA pump and showed how to. Pt still not using PCA. Bowel sounds absent, JP drains noted with no output.

## 2018-07-17 NOTE — Progress Notes (Signed)
PT C/O NAUSEA. MEDS GIVEN SEE MAR

## 2018-07-18 ENCOUNTER — Inpatient Hospital Stay: Payer: Self-pay

## 2018-07-18 LAB — BASIC METABOLIC PANEL
Anion gap: 9 (ref 5–15)
BUN: 8 mg/dL (ref 8–23)
CO2: 24 mmol/L (ref 22–32)
Calcium: 8 mg/dL — ABNORMAL LOW (ref 8.9–10.3)
Chloride: 109 mmol/L (ref 98–111)
Creatinine, Ser: 0.53 mg/dL (ref 0.44–1.00)
GFR calc Af Amer: >60 mL/min (ref >60)
GFR calc non Af Amer: >60 mL/min (ref >60)
Glucose, Bld: 129 mg/dL — ABNORMAL HIGH (ref 70–99)
Potassium: 4 mmol/L (ref 3.5–5.1)
Sodium: 142 mmol/L (ref 135–145)

## 2018-07-18 LAB — PHOSPHORUS: Phosphorus: 1.9 mg/dL — ABNORMAL LOW (ref 2.5–4.6)

## 2018-07-18 LAB — CBC
HCT: 29.3 % — ABNORMAL LOW (ref 36.0–46.0)
Hemoglobin: 8.7 g/dL — ABNORMAL LOW (ref 12.0–15.0)
MCH: 24.9 pg — ABNORMAL LOW (ref 26.0–34.0)
MCHC: 29.7 g/dL — ABNORMAL LOW (ref 30.0–36.0)
MCV: 84 fL (ref 80.0–100.0)
Platelets: 348 10*3/uL (ref 150–400)
RBC: 3.49 MIL/uL — ABNORMAL LOW (ref 3.87–5.11)
RDW: 18.8 % — ABNORMAL HIGH (ref 11.5–15.5)
WBC: 29.2 10*3/uL — ABNORMAL HIGH (ref 4.0–10.5)
nRBC: 0.1 % (ref 0.0–0.2)

## 2018-07-18 LAB — GLUCOSE, CAPILLARY
Glucose-Capillary: 95 mg/dL (ref 70–99)
Glucose-Capillary: 91 mg/dL (ref 70–99)
Glucose-Capillary: 121 mg/dL — ABNORMAL HIGH (ref 70–99)

## 2018-07-18 LAB — HEPARIN LEVEL (UNFRACTIONATED): Heparin Unfractionated: 0.51 IU/mL (ref 0.30–0.70)

## 2018-07-18 LAB — MAGNESIUM: Magnesium: 1.6 mg/dL — ABNORMAL LOW (ref 1.7–2.4)

## 2018-07-18 MED ORDER — PROMETHAZINE HCL 25 MG/ML IJ SOLN
12.5000 mg | Freq: Four times a day (QID) | INTRAMUSCULAR | Status: DC | PRN
  Administered 2018-07-27 – 2018-07-30 (×3): 12.5 mg via INTRAVENOUS
  Filled 2018-07-18 (×5): qty 1

## 2018-07-18 MED ORDER — ACETAMINOPHEN 10 MG/ML IV SOLN
1000.0000 mg | Freq: Four times a day (QID) | INTRAVENOUS | Status: AC
  Administered 2018-07-18 – 2018-07-19 (×3): 1000 mg via INTRAVENOUS
  Filled 2018-07-18 (×4): qty 100

## 2018-07-18 MED ORDER — MORPHINE SULFATE (PF) 2 MG/ML IV SOLN
2.0000 mg | INTRAVENOUS | Status: DC | PRN
  Administered 2018-07-22 – 2018-07-28 (×2): 2 mg via INTRAVENOUS
  Filled 2018-07-18 (×2): qty 1

## 2018-07-18 MED ORDER — SODIUM CHLORIDE 0.9% FLUSH
10.0000 mL | Freq: Two times a day (BID) | INTRAVENOUS | Status: DC
  Administered 2018-07-18 – 2018-08-03 (×24): 10 mL
  Administered 2018-08-05: 20 mL
  Administered 2018-08-05 – 2018-08-06 (×2): 10 mL
  Administered 2018-08-06: 20 mL
  Administered 2018-08-07: 10 mL

## 2018-07-18 MED ORDER — SODIUM CHLORIDE 0.9% FLUSH
10.0000 mL | INTRAVENOUS | Status: DC | PRN
  Administered 2018-07-29 – 2018-08-08 (×2): 10 mL
  Filled 2018-07-18 (×2): qty 40

## 2018-07-18 MED ORDER — METHOCARBAMOL 1000 MG/10ML IJ SOLN
500.0000 mg | Freq: Three times a day (TID) | INTRAVENOUS | Status: DC
  Administered 2018-07-18 – 2018-07-28 (×31): 500 mg via INTRAVENOUS
  Filled 2018-07-18 (×3): qty 5
  Filled 2018-07-18: qty 500
  Filled 2018-07-18 (×2): qty 5
  Filled 2018-07-18: qty 500
  Filled 2018-07-18 (×3): qty 5
  Filled 2018-07-18: qty 500
  Filled 2018-07-18 (×2): qty 5
  Filled 2018-07-18 (×2): qty 500
  Filled 2018-07-18 (×12): qty 5
  Filled 2018-07-18: qty 500
  Filled 2018-07-18 (×8): qty 5

## 2018-07-18 MED ORDER — INSULIN ASPART 100 UNIT/ML ~~LOC~~ SOLN
0.0000 [IU] | SUBCUTANEOUS | Status: DC
  Administered 2018-07-18 – 2018-07-27 (×16): 1 [IU] via SUBCUTANEOUS
  Administered 2018-07-27: 0 [IU] via SUBCUTANEOUS
  Administered 2018-07-28 – 2018-08-02 (×5): 1 [IU] via SUBCUTANEOUS

## 2018-07-18 MED ORDER — TRAVASOL 10 % IV SOLN
INTRAVENOUS | Status: AC
  Administered 2018-07-18: 17:00:00 via INTRAVENOUS
  Filled 2018-07-18: qty 462

## 2018-07-18 NOTE — Progress Notes (Signed)
PROGRESS NOTE  [name]:[id number] DOB: 1945/07/04 DOA: 07/07/2018 PCP: Junie Spencer, [name]   LOS: 11 days   Brief Narrative / Interim history:  73 year old female with history of chronic N/V/abdominal pain, A. fib, PVD status post right femoral thrombectomy on 05/31/2018, hypertension and chronic back pain presenting with intractable nausea, vomiting, abdominal pain, new onset of diarrhea and about 30 pounds weight loss in the last several months.   She was admitted to [facility] 5 days ago with somnolence and a diagnosis of sepsis. She has had a prior aortobifem by Dr Hart Rochester in 2002 and had thrombectomy of the right limb of this by Dr Randie Heinz 3/20. 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 Her left internal iliac artery was patent at that point.   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 Also there is a 3.9 x 1.8 cm and 2.6 x 1.1 cm fluid collection in right pelvis/groin thought to be seroma from prior surgery.   Per H&P, interventional radiology at [facility] was consulted and consideration was being given for stenting her SMA which has about a (30-50% stenosis) to improve mesenteric blood flow.   Patient was transferred to [facility] for further management.  Patient was also treated with cefepime for presumed pneumonia.  Gastroenterology and vascular surgery consulted.  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  Biopsies taken and pending.  She also had flexible sigmoidoscopy that showed only a 7 mm sessile polyp in the  rectum but poor prep.  On 4/15, and continued to have significant leukocytosis, mild temperature and abdominal tenderness.  This raised concern about intra-abdominal occult infection especially with the fluid collections noted on CT abdomen.  Infectious disease consulted and started patient on meropenem.  IR consulted and recommended getting repeat CT abdomen and pelvis which showed frank perforation of the descending colon appears to be contained within a portion of the omentum, or occluded celiac axis, slightly decreased pancreatic tail lesion but no previously noted fluid collections.  General surgery and IR consulted.  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 Derrell Lolling on 4/16  Subjective: No major events overnight of this morning.  Complains 10/10 abdominal pain.  On review of her medical history, she has not received morphine today.  She has been getting IV Tylenol.  Denies chest pain or dyspnea.  No output in ostomy bag.  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 Derrell Lollingamirez, 4/16 -IR and general surgery managing. -Vascular surgery on board as well-plan angiogram for confirmation of perfusion to the GI system once stabilized. -Pain control per  surgery-PRN morphine, IV Tylenol and Robaxin. -Hemoglobin is stable -PRN Zofran  Leukocytosis: Combination of stress and the above.  Improving. -Continue meropenem 4/15--Per ID.  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  Likely from occluded celiac axis -Continue PPI -Vascular surgery as above  Significant weight loss/moderate protein calorie malnutrition: Likely due to poor p.o. intake from the above. -Currently awaiting bowel function to return -Continue IV fluid for now  History of COPD: Stable -Continue DuoNeb  Peripheral vascular disease with dry gangrene -Continue home statin -Outpatient vascular surgery follow-up  Essential hypertension: Normotensive. -Continue home meds  Chronic atrial fibrillation: Not in RVR.  [place] vas score 6.  On Eliquis and metoprolol at home. -Heparin and metoprolol  Chronic pain: -Pain medications as above  Tobacco use disorder: -Cessation counseling -Nicotine patch  Hypokalemia-likely due to potassium supplementation-resolved. -Discontinued potassium and recheck  Scheduled Meds: . atorvastatin  40 mg Oral Daily  . Chlorhexidine Gluconate Cloth  6 each Topical Q0600  . folic acid  1 mg Oral Daily  . lidocaine  1 patch Transdermal Q24H  . metoprolol tartrate  75 mg Oral BID  . multivitamin with minerals  1 tablet Oral Daily  . nicotine  21 mg Transdermal Daily  . pantoprazole (PROTONIX) IV  40 mg Intravenous Q24H  . pregabalin  200 mg Oral TID  . thiamine  100 mg Oral Daily   Or  . thiamine  100 mg Intravenous Daily   Continuous Infusions: . sodium chloride Stopped (07/16/18 0842)  . sodium chloride 75 mL/hr at 07/18/18 0144  . acetaminophen    . heparin 1,350 Units/hr (07/18/18 0426)  . meropenem (MERREM) IV 1 g (07/18/18 0714)  . methocarbamol (ROBAXIN) IV     PRN Meds:.sodium chloride, alum & mag hydroxide-simeth, hydrALAZINE, ipratropium-albuterol, labetalol, morphine injection, oxyCODONE,  promethazine  DVT prophylaxis: On heparin drip for atrial fibrillation Code Status: Full code Family Communication: None at bedside.  Updated patient's daughter over the phone 4/17. Disposition Plan: Remains inpatient. Critically sick with open abdomen  Consultants:   Vascular surgery  Gastroenterology  Infectious disease  Interventional radiology  General surgery  Procedures:   EGD on 4/14  Flex sigmoidoscopy on 4/14  IR drain placement on 4/16 which showed a large left colon defect, 4/16  Ex Lap, LOA, Left sigmoid colectomy, descending colostomy (Hartmann's), Dr. Derrell Lollingamirez, 4/16  Antimicrobials:  Vancomycin on 4/7  Cefepime 4/7-4/12  Meropenem 4/15--  Objective: Vitals:   07/17/18 1200 07/17/18 1947 07/18/18 0529 07/18/18 0817  BP:  (!) 172/76 (!) 164/73 (!) 165/70  Pulse:    85  Resp:    (!) 21  Temp:  98 F (36.7 C) 98.1 F (36.7 C) 97.8 F (36.6 C)  TempSrc:  Oral Oral Oral  SpO2: 96%   98%  Weight:      Height:        Intake/Output Summary (Last 24 hours) at 07/18/2018 1007 Last data filed at 07/18/2018 0800 Gross per 24 hour  Intake 1738.75 ml  Output 1623 ml  Net 115.75 ml   Filed Weights   07/15/18 0515 07/16/18 0508 07/17/18 0501  Weight: 63 kg 62.4 kg 62.1 kg    Examination:  GENERAL: Appears well. No acute  distress.  HEENT: MMM.  Vision and Hearing grossly intact.  NECK: Supple.  No JVD.  LUNGS:  No IWOB.  Fair air movement bilaterally. HEART:  RRR. Heart sounds normal.  ABD: No bowel sounds.  Soft.  Drains in place with minimal serosanguineous fluid.  Surgical wound open and clean.  Colostomy site appears clean.  Colostomy bag empty. EXT:   no edema bilaterally.  SKIN: no apparent skin lesion.  NEURO: Awake, alert and oriented appropriately.  No gross deficit.  PSYCH: Calm. Normal affect.  Data Reviewed: I have independently reviewed following labs and imaging studies  CBC: Recent Labs  Lab 07/14/18 0457 07/15/18 0442  07/16/18 0344 07/17/18 0259 07/18/18 0301  WBC 21.1* 25.0*  24.6* 21.8* 32.1* 29.2*  NEUTROABS  --  21.9*  --   --   --   HGB 9.9* 10.9*  10.8* 9.8* 10.5* 8.7*  HCT 32.9* 36.8  36.1 31.1* 34.7* 29.3*  MCV 81.8 84.0  82.6 81.8 85.0 84.0  PLT 476* 448*  454* 460* 358 348   Basic Metabolic Panel: Recent Labs  Lab 07/11/18 1949 07/12/18 0356 07/14/18 0457 07/15/18 0739 07/16/18 0344 07/17/18 0259 07/18/18 0301  NA 130* 133* 135 136 138 140 142  K 5.0 4.5 3.5 4.3 4.3 5.7* 4.0  CL 98 99 102 99 104 107 109  CO2 21* 20* 23 24 25 22 24   GLUCOSE 116* 87 79 125* 118* 135* 129*  BUN 7* 8 5* 9 11 13 8   CREATININE 0.62 0.72 0.58 0.58 0.56 0.85 0.53  CALCIUM 8.1* 8.3* 8.1* 8.6* 8.5* 8.3* 8.0*  MG 1.5* 1.7  --   --   --   --   --    GFR: Estimated Creatinine Clearance: 62.7 mL/min (by C-G formula based on SCr of 0.53 mg/dL). Liver Function Tests: Recent Labs  Lab 07/11/18 1949 07/15/18 0739  AST 36 28  ALT 14 13  ALKPHOS 119 121  BILITOT 0.8 0.7  PROT 5.3* 6.0*  ALBUMIN 1.6* 1.8*   No results for input(s): LIPASE, AMYLASE in the last 168 hours. No results for input(s): AMMONIA in the last 168 hours. Coagulation Profile: No results for input(s): INR, PROTIME in the last 168 hours. Cardiac Enzymes: No results for input(s): CKTOTAL, CKMB, CKMBINDEX, TROPONINI in the last 168 hours. BNP (last 3 results) No results for input(s): PROBNP in the last 8760 hours. HbA1C: No results for input(s): HGBA1C in the last 72 hours. CBG: Recent Labs  Lab 07/15/18 2053 07/16/18 0608 07/17/18 0616 07/17/18 1143 07/17/18 1642  GLUCAP 133* 98 129* 127* 117*   Lipid Profile: No results for input(s): CHOL, HDL, LDLCALC, TRIG, CHOLHDL, LDLDIRECT in the last 72 hours. Thyroid Function Tests: No results for input(s): TSH, T4TOTAL, FREET4, T3FREE, THYROIDAB in the last 72 hours. Anemia Panel: No results for input(s): VITAMINB12, FOLATE, FERRITIN, TIBC, IRON, RETICCTPCT in the last 72  hours. Urine analysis:    Component Value Date/Time   COLORURINE AMBER (A) 07/07/2018 1826   APPEARANCEUR HAZY (A) 07/07/2018 1826   LABSPEC 1.014 07/07/2018 1826   PHURINE 5.0 07/07/2018 1826   GLUCOSEU 50 (A) 07/07/2018 1826   HGBUR NEGATIVE 07/07/2018 1826   BILIRUBINUR NEGATIVE 07/07/2018 1826   KETONESUR NEGATIVE 07/07/2018 1826   PROTEINUR NEGATIVE 07/07/2018 1826   NITRITE NEGATIVE 07/07/2018 1826   LEUKOCYTESUR NEGATIVE 07/07/2018 1826   Sepsis Labs: Invalid input(s): PROCALCITONIN, LACTICIDVEN  No results found for this or any previous visit (from the past 240 hour(s)).  Radiology Studies: No results found.  Brondon Wann T. [facility] Pager [phone]  If 7PM-7AM, please contact night-coverage [email or web address] Password [email or web address] 07/18/2018, 10:07 AM

## 2018-07-18 NOTE — Progress Notes (Addendum)
[facility] [place]  Subjective  - Pain is a little better, right ankle tender to palpation laterally.  She is unable to bear weight on it.   Objective (!) 165/70 85 97.8 F (36.6 C) (Oral) (!) 21 98%  Intake/Output Summary (Last 24 hours) at 07/18/2018 0910 Last data filed at 07/18/2018 0800 Gross per 24 hour  Intake 1813.75 ml  Output 1623 ml  Net 190.75 ml    Abdomin Colostomy in place with abdominal drains B LE palpable pedal pulses, tenderness to palpation and movement of the right foot and ankle.   Assessment/Planning: Pending stabilization we will plan angiogram for confirmation of perfusion to the GI system. 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 Derrell Lolling on 4/16   [name] 07/18/2018 9:10 AM --  Laboratory Lab Results: Recent Labs    07/17/18 0259 07/18/18 0301  WBC 32.1* 29.2*  HGB 10.5* 8.7*  HCT 34.7* 29.3*  PLT 358 348   BMET Recent Labs    07/17/18 0259 07/18/18 0301  NA 140 142  K 5.7* 4.0  CL 107 109  CO2 22 24  GLUCOSE 135* 129*  BUN 13 8  CREATININE 0.85 0.53  CALCIUM 8.3* 8.0*    COAG Lab Results  Component Value Date   INR 1.3 (H) 07/10/2018   INR 1.7 (H) 07/07/2018   INR 1.05 05/17/2009   No results found for: PTT   I have interviewed and examined patient with PA and agree with assessment and plan above. Likely plan abdominal angiogram next week.  Cristin Penaflor C. Randie Heinz, MD [facility] [place] Office: [phone] Pager: [phone]

## 2018-07-18 NOTE — Progress Notes (Signed)
[facility]uma Progress Note  2 Days Post-Op   Assessment/Plan A. 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 Derrell Lollingamirez, 4/16 - BID WTD dressing changes post-op ileus. No bowel function throughout colostomy. NPO - Patient pulled NG tube overnight 04/17. Re-insert if develops N/V or abdominal distension.  - Mobilize as tolerated. IS/pulm toliet.   FEN- NPO, IVF, K 4.0 VTE -SCDs, heparin  ID -Meropenem 4/15 >> per ID, WBC 32.1, afebrile Foley - Removed POD 1 Follow up -Dr. Derrell Lollingamirez    LOS: 11 days    Subjective: CC: abdominal pain and nausea  Pt states 2 small episodes of vomiting overnight. She states it was yellow and she denies it was bloody or brown.  Continued nausea. No fever or chills.   Objective: Vital signs in last 24 hours: Temp:  [98 F (36.7 C)-98.3 F (36.8 C)] 98.1 F (36.7 C) (04/18 0529) Pulse Rate:  [93] 93 (04/17 1140) Resp:  [21] 21 (04/17 1140) BP: (159-172)/(71-76) 164/73 (04/18 0529) SpO2:  [96 %] 96 % (04/17 1200) Last BM Date: 07/13/18  Intake/Output from previous day: 04/17 0701 - 04/18 0700 In: 1943.8 [I.V.:1543.8; IV Piggyback:400] Out: 1610 [RUEAV:[id number] [Urine:1465; Drains:158] Intake/Output this shift: No intake/output data recorded.  PE: Gen: Awake. NAD, appears uncomfortable  Lungs: Normal effort and rate Abd: Soft, ND. Hypoactive bowel sounds. Stoma red and appears viable. No air or stool in colostomy bag. Midline wound appears clean with healthy base of granulation tissue. JP drains x 2, one with sanguinous drainage and other with serosanguinous, generalized TTP with guarding.    Anti-infectives: Anti-infectives (From admission, onward)   Start     Dose/Rate Route Frequency Ordered Stop   07/15/18 1700  meropenem (MERREM) 1 g in sodium chloride 0.9 % 100 mL IVPB     1 g 200 mL/hr over 30 Minutes Intravenous Every 8 hours 07/15/18 1620     07/08/18 2300  vancomycin (VANCOCIN) 1,500 mg in sodium chloride 0.9 % 500 mL IVPB  Status:  Discontinued     1,500 mg 250 mL/hr over 120 Minutes Intravenous  Once 07/07/18 2128 07/07/18 2129   07/08/18 2300  vancomycin (VANCOCIN) 1,500 mg in sodium chloride 0.9 % 500 mL IVPB  Status:  Discontinued     1,500 mg 250 mL/hr over 120 Minutes Intravenous Every 24 hours 07/07/18 2129 07/08/18 0726   07/08/18 0600  ceFEPIme (MAXIPIME) 2 g in sodium chloride 0.9 % 100 mL IVPB  Status:  Discontinued     2 g 200 mL/hr over 30 Minutes Intravenous Every 8 hours 07/07/18 2124 07/13/18 1333   07/07/18 2130  vancomycin (VANCOCIN) 1,500 mg in sodium chloride 0.9 % 500 mL IVPB     1,500 mg 250 mL/hr over 120 Minutes Intravenous  Once 07/07/18 2123 07/08/18 0028   07/07/18 2115  vancomycin (VANCOCIN) IVPB 1000 mg/200 mL premix  Status:  Discontinued     1,000 mg 200 mL/hr over 60 Minutes Intravenous  Once 07/07/18 2110 07/07/18 2128   07/07/18 2115  ceFEPIme (MAXIPIME) 2 g in sodium chloride 0.9 % 100 mL IVPB     2 g 200 mL/hr over 30 Minutes Intravenous  Once 07/07/18 2110 07/07/18 2203  Lab Results:  Recent Labs    07/17/18 0259 07/18/18 0301  WBC 32.1* 29.2*  HGB 10.5* 8.7*  HCT 34.7* 29.3*  PLT 358 348   BMET Recent Labs    07/17/18 0259 07/18/18 0301  NA 140 142  K 5.7* 4.0  CL 107 109  CO2 22 24  GLUCOSE 135* 129*  BUN 13 8  CREATININE 0.85 0.53  CALCIUM 8.3* 8.0*   PT/INR No results for input(s): LABPROT, INR in the last 72 hours. CMP     Component Value Date/Time   NA 142 07/18/2018 0301   NA 142 05/22/2018 1039   K 4.0 07/18/2018 0301   CL 109 07/18/2018 0301   CO2 24 07/18/2018 0301   GLUCOSE 129  (H) 07/18/2018 0301   BUN 8 07/18/2018 0301   BUN 10 05/22/2018 1039   CREATININE 0.53 07/18/2018 0301   CREATININE 0.72 08/07/2012 1016   CALCIUM 8.0 (L) 07/18/2018 0301   PROT 6.0 (L) 07/15/2018 0739   PROT 6.8 05/22/2018 1039   ALBUMIN 1.8 (L) 07/15/2018 0739   ALBUMIN 3.6 (L) 05/22/2018 1039   AST 28 07/15/2018 0739   ALT 13 07/15/2018 0739   ALKPHOS 121 07/15/2018 0739   BILITOT 0.7 07/15/2018 0739   BILITOT 0.3 05/22/2018 1039   GFRNONAA >60 07/18/2018 0301   GFRNONAA 88 08/07/2012 1016   GFRAA >60 07/18/2018 0301   GFRAA >89 08/07/2012 1016   Lipase     Component Value Date/Time   LIPASE 65 (H) 07/08/2018 1325    Studies/Results: Ct Image Guided Drainage By Percutaneous Catheter  Result Date: 07/16/2018 INDICATION: 73 year old with perforation of the descending colon. There is a large extraluminal gas collection that communicates with the descending colon. General Surgery requested placement of a drainage catheter within the extraluminal gas collection. EXAM: CT-GUIDED DRAIN PLACEMENT WITHIN THE PERICOLONIC COLLECTION MEDICATIONS: The patient is currently admitted to the hospital and receiving intravenous antibiotics. ANESTHESIA/SEDATION: 2.0 mg IV Versed 75 mcg IV Fentanyl Moderate Sedation Time:  31 minutes The patient was continuously monitored during the procedure by the interventional radiology nurse under my direct supervision. COMPLICATIONS: None immediate. TECHNIQUE: Informed written consent was obtained from the patient after a thorough discussion of the procedural risks, benefits and alternatives. All questions were addressed. Maximal Sterile Barrier Technique was utilized including caps, mask, sterile gowns, sterile gloves, sterile drape, hand hygiene and skin antiseptic. A timeout was performed prior to the initiation of the procedure. PROCEDURE: CT images through the abdomen were obtained. The large gas collection communicating the descending colon in the left  anterior abdomen was targeted. The left upper abdomen was prepped with chlorhexidine and sterile field was created. Skin and soft tissues anesthetized with 1% lidocaine. 18 gauge trocar needle was directed into the extraluminal collection. Air was coming out of the needle. Stiff Amplatz wire preferentially went into the adjacent colon. As a result, this access was removed. Skin was anesthetized slightly more medial and cephalad. Needle was directed into the extraluminal gas collection with CT guidance. Again, the wire preferentially went into the colon. Tract was dilated to accommodate a 12 [place] multipurpose drain. Initially, the tube was extending into the colon. The tube was pulled back so that the pigtail was outside the colon lumen. Catheter was sutured to skin and attached to a gravity bag. FINDINGS: Again noted is a contained perforation involving the anterior wall of the descending colon. The size the anterior colonic wall defect measures close to 3 cm. Again noted is contained  gas extending anterior and cephalad to the descending colon. Drainage catheter was placed within the extraluminal collection. Drain was pulled out of the colon lumen and positioned just anterior to the colon. IMPRESSION: CT-guided placement of a drainage catheter within the large extraluminal gas collection in the left abdomen. Findings are compatible with contained perforation of the descending colon. Electronically Signed   By: Richarda Overlie M.D.   On: 07/16/2018 14:42      Jerre Simon , [facility] Surgery 07/18/2018, 8:02 AM  Pager: [phone] Mon-Wed, Friday 7:00am-4:30pm Thurs 7am-11:30am  Consults: [phone]

## 2018-07-18 NOTE — Progress Notes (Signed)
Peripherally Inserted Central Catheter/Midline Placement  The IV Nurse has discussed with the patient and/or persons authorized to consent for the patient, the purpose of this procedure and the potential benefits and risks involved with this procedure.  The benefits include less needle sticks, lab draws from the catheter, and the patient may be discharged home with the catheter. Risks include, but not limited to, infection, bleeding, blood clot (thrombus formation), and puncture of an artery; nerve damage and irregular heartbeat and possibility to perform a PICC exchange if needed/ordered by physician.  Alternatives to this procedure were also discussed.  Bard Power PICC patient education guide, fact sheet on infection prevention and patient information card has been provided to patient /or left at bedside.  Telephone consent obtained from daughter, [name], due to confusion.  PICC/Midline Placement Documentation  PICC Double Lumen 07/18/18 PICC Right Brachial 35 cm 0 cm (Active)  Indication for Insertion or Continuance of Line Administration of hyperosmolar/irritating solutions (i.e. TPN, Vancomycin, etc.) 07/18/2018 11:50 AM  Exposed Catheter (cm) 0 cm 07/18/2018 11:50 AM  Site Assessment Clean;Dry;Intact 07/18/2018 11:50 AM  Lumen #1 Status Flushed;Saline locked;Blood return noted 07/18/2018 11:50 AM  Lumen #2 Status Flushed;Saline locked;Blood return noted 07/18/2018 11:50 AM  Dressing Type Transparent 07/18/2018 11:50 AM  Dressing Status Clean;Intact;Dry;Antimicrobial disc in place 07/18/2018 11:50 AM  Line Care Connections checked and tightened 07/18/2018 11:50 AM  Line Adjustment (NICU/IV Team Only) No 07/18/2018 11:50 AM  Dressing Intervention New dressing 07/18/2018 11:50 AM  Dressing Change Due 07/25/18 07/18/2018 11:50 AM       Elliot Dally 07/18/2018, 11:51 AM

## 2018-07-18 NOTE — Progress Notes (Signed)
ANTICOAGULATION CONSULT NOTE   Pharmacy Consult for heparin  Indication: atrial fibrillation  No Known Allergies  Patient Measurements: Height: 5\' 7"  (170.2 cm) Weight: 136 lb 14.5 oz (62.1 kg) IBW/kg (Calculated) : 61.6  HEPARIN DW (KG): 62.3   Vital Signs: Temp: 97.8 F (36.6 C) (04/18 0817) Temp Source: Oral (04/18 0817) BP: 165/70 (04/18 0817) Pulse Rate: 85 (04/18 0817)  Labs: Recent Labs    07/16/18 0344 07/17/18 0259 07/17/18 2019 07/18/18 0301 07/18/18 1036  HGB 9.8* 10.5*  --  8.7*  --   HCT 31.1* 34.7*  --  29.3*  --   PLT 460* 358  --  348  --   HEPARINUNFRC 0.74*  --  0.56  --  0.51  CREATININE 0.56 0.85  --  0.53  --     Estimated Creatinine Clearance: 62.7 mL/min (by C-G formula based on SCr of 0.53 mg/dL).   Assessment: 73 y.o. female with h/o Afib, Eliquis on hold, for heparin. Last apixaban dose was 10am on 07/08/18.  S/p upper endoscopy 07/14/18 found Severe ulcerative gastritis most consistent ischemia, biopsied, results pending. CT  showed perforation of descending colon. , plan for s/p perc drain placement by IR , expl lap and left sigmoid colectomy with colostomy on 07/16/18.    Heparin drip resumed 4/17, level is currently at goal.  Hgb with slight fall, but no overt bleeding or complications noted.  Goal of Therapy:  Heparin level 0.3-0.7 units/ml Monitor platelets by anticoagulation protocol: Yes   Plan:  Continue IV heparin at current rate. Daily heparin level, CBC, s/s bleeding F/u for transtion back to apixaban after procedures done or stable.  Jenetta Downer, [facility] Clinical Pharmacist Phone [phone]  07/18/2018 11:56 AM

## 2018-07-18 NOTE — Progress Notes (Signed)
Pharmacy Antibiotic Note  [name] is a 73 y.o. female admitted on 07/07/2018 with pneumonia.    Patient now with leukocytosis and intra-abdominal fluid collections. New orders received from ID service to start meropenem for abdominal perforation.  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 Derrell Lolling on 4/16  Now afebrile, wbc 29.2, Scr stable at 0.53  Plan: Meropenem 1g q8 hours  Height: 5\' 7"  (170.2 cm) Weight: 136 lb 14.5 oz (62.1 kg) IBW/kg (Calculated) : 61.6  Temp (24hrs), Avg:98 F (36.7 C), Min:97.8 F (36.6 C), Max:98.1 F (36.7 C)  Recent Labs  Lab 07/14/18 0457 07/15/18 0442 07/15/18 0739 07/16/18 0344 07/17/18 0259 07/18/18 0301  WBC 21.1* 25.0*  24.6*  --  21.8* 32.1* 29.2*  CREATININE 0.58  --  0.58 0.56 0.85 0.53    Estimated Creatinine Clearance: 62.7 mL/min (by C-G formula based on SCr of 0.53 mg/dL).    No Known Allergies  Antimicrobials this admission: Cefepime 4/7 >> 4/13 Vanco 4/7 >> 4/8 Meropenem 4/15>   Microbiology results: 4/7 BCx x 2: neg 4/8 mrsa pcr negative    Thank you for allowing pharmacy to be a part of this patient's care.  Jenetta Downer, [facility] Clinical Pharmacist Phone [phone]  07/18/2018 11:59 AM

## 2018-07-18 NOTE — Progress Notes (Signed)
PHARMACY - ADULT TOTAL PARENTERAL NUTRITION CONSULT NOTE   Pharmacy Consult for TPN Indication: Post-op ileus  Patient Measurements: Height: 5\' 7"  (170.2 cm) Weight: 136 lb 14.5 oz (62.1 kg) IBW/kg (Calculated) : 61.6 TPN AdjBW (KG): 62.3 Body mass index is 21.44 kg/m.  Assessment: 40-yo female with h/o chronic N/V/abdominal pain, A. fib, PVD status post right femoral thrombectomy on 05/31/2018, hypertension and chronic back pain presenting with intractable nausea, vomiting, abdominal pain, new onset of diarrhea and about 30 pounds weight loss in the last several months.   She was admitted to [facility] with a diagnosis of sepsis. 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 Also there is a 3.9 x 1.8 cm and 2.6 x 1.1 cm fluid collection in right pelvis/groin thought to be seroma from prior surgery. 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   Patient was transferred to [facility] for further management. S/p upper endoscopy 07/14/18 found Severe ulcerative gastritis most consistent ischemia, biopsied, results pending. CT showed perforation of descending colon. 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 Now with post-op ileus in the setting of 30 pound weight loss and minimal intake since admission.  GI: 2 x JP drains with 158 mls output, 0 mls from colostomy Endo: No history of diabetes; CBGs 100 - 135 Insulin requirements in the past 24 hours: 0 Lytes: K 4.0, Corr Ca 9.8 (Alb 1.8) Renal: Scr 0.53; BUN 8 Pulm:RA Cards:  HTN 150 - 175/70 - 80; HR 85-95 on heparin drip Hepatobil:AST/ALT 28/13; TBili 0.7 Neuro: Pain 10/10 - not taking morphine ID: WBC 29.2; Meropenem 4/15>>  TPN Access: PICC requested for today (4/18) TPN start date: 4/18 Nutritional Goals (per RD recommendation on 4/15): Kcal:  1800-2000 kcal Protein:  90-105 grams Fluid:  >/= 1.8 L/day  Goal TPN rate is 75 ml/hr (This TPN will provide 100 g of protein, 265 g of dextrose, and 67 g of lipids which provides 1904  kCals per day, meeting 100% of patient needs)  Current Nutrition:  NPO  Plan:  Initiate TPN at 35 mL/hr. This TPN provides 46.2 g of protein, 123.5 g of dextrose, and 31 g of lipids which provides 883.6 kCals per day, meeting 50% of patient needs Electrolytes in TPN: Standard Add MVI to TPN - trace elements are on back order will only place in TPN on Monday, Wednesday and Friday Initiate sensitive SSI and adjust as needed Decrease IVMF (NS) to 40 ml/hr when TPN hung at 1800 Monitor TPN labs F/u - patient at risk for refeeding  Jeanella Cara, PharmD, [facility] Clinical Pharmacist Please see AMION for all Pharmacists' Contact Phone Numbers 07/18/2018, 10:56 AM

## 2018-07-18 NOTE — Plan of Care (Signed)
Care plans reviewed and updated.  Patient is progressing.

## 2018-07-18 NOTE — Progress Notes (Signed)
Nutrition Follow-up   DOCUMENTATION CODES:  Severe malnutrition in context of acute illness/injury  INTERVENTION:  TPN per pharmacy  D/t  systemwide shortage of the 48m IV multivitamins. Please only add multi-trace elements to TPN three times a week on Monday, Wednesday, and Friday. Consulting PharmD already aware  Will follow along for diet progression.   NUTRITION DIAGNOSIS:  Severe Malnutrition related to acute illness(probable ischemic colitis) as evidenced by 21% wt loss in 4 months and an estimated energy intake < or equal to 50% for > or equal to 5 days.  Ongoing, worsening  GOAL:  Patient will meet greater than or equal to 90% of their needs   Not met  MONITOR:  PO intake, Supplement acceptance, Labs, Skin, I & O's, Weight trends  REASON FOR ASSESSMENT:  Consult New TPN/TNA  ASSESSMENT:  73y/o female PMHx GERD, HLD, HTN, PVD s/p aortofem bypass, s/p RLE thromboembolectomy and R common emoral artery patch angioplasty 05/31/18, and tobacco abuse. Presents this admission with sepsis secondary to PNA and probable ischemic colitis.   4/14- flex sig  4/15 am- diet briefly advanced to heart healthy 4/15 pm- Repeat CT revealed self contained perforation. Made npo again 4/16- s/p drain placement. Large L colon defect noted during placement. 4/16 - s/p exlap, LOA, L sigmoid colectomy w/ colostomy and 2x JP Drain placement. Midline wound left open.  4/18 RD consulted for TPN d/t ileus.   No output from ostomy as of yet. Pt has been having episodes of emesis.   Dosing weight remains 137 lbs. Slightly adjusted pro/kcal needs given recent major surgery   Labs: Bgs: 90--13, WBC: 29.2, Phos/mag slightly low Meds: Folate, Insulin, mvi with min, ppi, ivf, ivabx  Recent Labs  Lab 07/11/18 1949 07/12/18 0356  07/16/18 0344 07/17/18 0259 07/18/18 0301 07/18/18 1036  NA 130* 133*   < > 138 140 142  --   K 5.0 4.5   < > 4.3 5.7* 4.0  --   CL 98 99   < > 104 107 109  --    CO2 21* 20*   < > _0 --   BUN 7* 8   < > _1 --   CREATININE 0.62 0.72   < > 0.56 0.85 0.53  --   CALCIUM 8.1* 8.3*   < > 8.5* 8.3* 8.0*  --   MG 1.5* 1.7  --   --   --   --  1.6*  PHOS  --   --   --   --   --   --  1.9*  GLUCOSE 116* 87   < > 118* 135* 129*  --    < > = values in this interval not displayed.   NUTRITION - FOCUSED PHYSICAL EXAM: Unable to conduct  Diet Order:   Diet Order            Diet NPO time specified  Diet effective now             EDUCATION NEEDS:  Education needs have been addressed  Skin:  Venous statis ulcer to L toe Surgical incision to R + L abdomen and to R groin  OPEN wound.   Last BM:  4/13. S/p colostomy placement  Height:  Ht Readings from Last 1 Encounters:  07/14/18 _2  (1.702 m)   Weight:  Wt Readings from Last 1 Encounters:  07/17/18 62.1 kg   Wt Readings from Last 10 Encounters:  07/17/18  62.1 kg  06/03/18 77.2 kg  05/22/18 75.1 kg  05/01/18 76.5 kg  04/24/18 77.7 kg  04/17/18 78.6 kg  03/05/18 79.9 kg  12/05/17 77.1 kg  11/14/17 74.9 kg  11/13/17 74.7 kg   Ideal Body Weight:  61.4 kg  BMI:  Body mass index is 21.44 kg/m.  Estimated Nutritional Needs:  Kcal:  2000-2200 kcals (32-36 kcal/kg bw) Protein:  112-125g Pro  (1.8-2g/kg bw) Fluid:  2-2.2 L (12m/kcal)  NBurtis JunesRD, LDN, CNSC Clinical Nutrition Available Tues-Sat via Pager: [phone]/18/2020 5:02 PM

## 2018-07-19 LAB — DIFFERENTIAL
Abs Immature Granulocytes: 0.16 10*3/uL — ABNORMAL HIGH (ref 0.00–0.07)
Basophils Absolute: 0 10*3/uL (ref 0.0–0.1)
Basophils Relative: 0 %
Eosinophils Absolute: 0 10*3/uL (ref 0.0–0.5)
Eosinophils Relative: 0 %
Immature Granulocytes: 1 %
Lymphocytes Relative: 9 %
Lymphs Abs: 2.3 10*3/uL (ref 0.7–4.0)
Monocytes Absolute: 0.9 10*3/uL (ref 0.1–1.0)
Monocytes Relative: 3 %
Neutro Abs: 22.7 10*3/uL — ABNORMAL HIGH (ref 1.7–7.7)
Neutrophils Relative %: 87 %

## 2018-07-19 LAB — GLUCOSE, CAPILLARY
Glucose-Capillary: 105 mg/dL — ABNORMAL HIGH (ref 70–99)
Glucose-Capillary: 111 mg/dL — ABNORMAL HIGH (ref 70–99)
Glucose-Capillary: 115 mg/dL — ABNORMAL HIGH (ref 70–99)
Glucose-Capillary: 116 mg/dL — ABNORMAL HIGH (ref 70–99)
Glucose-Capillary: 127 mg/dL — ABNORMAL HIGH (ref 70–99)
Glucose-Capillary: 129 mg/dL — ABNORMAL HIGH (ref 70–99)

## 2018-07-19 LAB — COMPREHENSIVE METABOLIC PANEL
ALT: 36 U/L (ref 0–44)
AST: 71 U/L — ABNORMAL HIGH (ref 15–41)
Albumin: 1.3 g/dL — ABNORMAL LOW (ref 3.5–5.0)
Alkaline Phosphatase: 105 U/L (ref 38–126)
Anion gap: 3 — ABNORMAL LOW (ref 5–15)
BUN: 7 mg/dL — ABNORMAL LOW (ref 8–23)
CO2: 28 mmol/L (ref 22–32)
Calcium: 7.7 mg/dL — ABNORMAL LOW (ref 8.9–10.3)
Chloride: 109 mmol/L (ref 98–111)
Creatinine, Ser: 0.4 mg/dL — ABNORMAL LOW (ref 0.44–1.00)
GFR calc Af Amer: >60 mL/min (ref >60)
GFR calc non Af Amer: >60 mL/min (ref >60)
Glucose, Bld: 138 mg/dL — ABNORMAL HIGH (ref 70–99)
Potassium: 3.2 mmol/L — ABNORMAL LOW (ref 3.5–5.1)
Sodium: 140 mmol/L (ref 135–145)
Total Bilirubin: 0.5 mg/dL (ref 0.3–1.2)
Total Protein: 4.7 g/dL — ABNORMAL LOW (ref 6.5–8.1)

## 2018-07-19 LAB — CBC
HCT: 25.7 % — ABNORMAL LOW (ref 36.0–46.0)
Hemoglobin: 7.5 g/dL — ABNORMAL LOW (ref 12.0–15.0)
MCH: 25.3 pg — ABNORMAL LOW (ref 26.0–34.0)
MCHC: 29.2 g/dL — ABNORMAL LOW (ref 30.0–36.0)
MCV: 86.5 fL (ref 80.0–100.0)
Platelets: 283 10*3/uL (ref 150–400)
RBC: 2.97 MIL/uL — ABNORMAL LOW (ref 3.87–5.11)
RDW: 18.7 % — ABNORMAL HIGH (ref 11.5–15.5)
WBC: 26.1 10*3/uL — ABNORMAL HIGH (ref 4.0–10.5)
nRBC: 0.2 % (ref 0.0–0.2)

## 2018-07-19 LAB — TRIGLYCERIDES: Triglycerides: 102 mg/dL (ref <150)

## 2018-07-19 LAB — MAGNESIUM: Magnesium: 1.7 mg/dL (ref 1.7–2.4)

## 2018-07-19 LAB — HEPARIN LEVEL (UNFRACTIONATED): Heparin Unfractionated: 0.35 IU/mL (ref 0.30–0.70)

## 2018-07-19 LAB — PHOSPHORUS: Phosphorus: 1.9 mg/dL — ABNORMAL LOW (ref 2.5–4.6)

## 2018-07-19 LAB — PREALBUMIN: Prealbumin: <5 mg/dL — ABNORMAL LOW (ref 18–38)

## 2018-07-19 MED ORDER — MAGNESIUM SULFATE 2 GM/50ML IV SOLN: 2.0000 g | Freq: Once | INTRAVENOUS | Status: DC

## 2018-07-19 MED ORDER — POTASSIUM PHOSPHATES 15 MMOLE/5ML IV SOLN
20.0000 mmol | Freq: Once | INTRAVENOUS | Status: AC
  Administered 2018-07-19: 20 mmol via INTRAVENOUS
  Filled 2018-07-19: qty 6.67

## 2018-07-19 MED ORDER — POTASSIUM PHOSPHATES 15 MMOLE/5ML IV SOLN: 30.0000 mmol | Freq: Four times a day (QID) | INTRAVENOUS | Status: DC

## 2018-07-19 MED ORDER — POTASSIUM CHLORIDE 10 MEQ/50ML IV SOLN
10.0000 meq | Freq: Once | INTRAVENOUS | Status: AC
  Administered 2018-07-19: 10 meq via INTRAVENOUS
  Filled 2018-07-19 (×2): qty 50

## 2018-07-19 MED ORDER — POTASSIUM CHLORIDE 10 MEQ/50ML IV SOLN
10.0000 meq | INTRAVENOUS | Status: AC
  Administered 2018-07-19 (×6): 10 meq via INTRAVENOUS
  Filled 2018-07-19 (×6): qty 50

## 2018-07-19 MED ORDER — TRAVASOL 10 % IV SOLN
INTRAVENOUS | Status: AC
  Administered 2018-07-19: 17:00:00 via INTRAVENOUS
  Filled 2018-07-19: qty 1195.2

## 2018-07-19 NOTE — Progress Notes (Signed)
3 Days Post-Op   Subjective/Chief Complaint: Pt with no acute changes   Objective: Vital signs in last 24 hours: Temp:  [97.9 F (36.6 C)-98.1 F (36.7 C)] 98 F (36.7 C) (04/19 0415) Pulse Rate:  [54-74] 54 (04/19 0415) Resp:  [13-18] 16 (04/19 0415) BP: (126-163)/(54-64) 126/54 (04/19 0415) SpO2:  [94 %-97 %] 94 % (04/19 0415) Weight:  [68.2 kg] 68.2 kg (04/19 0500) Last BM Date: 07/13/18  Intake/Output from previous day: 04/18 0701 - 04/19 0700 In: 3576.5 [P.O.:120; I.V.:2656.5; IV Piggyback:800] Out: 915 [Urine:800; Drains:115] Intake/Output this shift: No intake/output data recorded.  General appearance: alert and cooperative Resp: clear to auscultation bilaterally Cardio: regular rate and rhythm, S1, S2 normal, no murmur, click, rub or gallop GI: soft, inc c/d/i, ostomy pink viable, no output  Lab Results:  Recent Labs    07/18/18 0301 07/19/18 0432  WBC 29.2* 26.1*  HGB 8.7* 7.5*  HCT 29.3* 25.7*  PLT 348 283   BMET Recent Labs    07/18/18 0301 07/19/18 0432  NA 142 140  K 4.0 3.2*  CL 109 109  CO2 24 28  GLUCOSE 129* 138*  BUN 8 7*  CREATININE 0.53 0.40*  CALCIUM 8.0* 7.7*   PT/INR No results for input(s): LABPROT, INR in the last 72 hours. ABG No results for input(s): PHART, HCO3 in the last 72 hours.  Invalid input(s): PCO2, PO2  Studies/Results: [place] Ekg Site Rite  Result Date: 07/18/2018 If Site Rite image not attached, placement could not be confirmed due to current cardiac rhythm.   Anti-infectives: Anti-infectives (From admission, onward)   Start     Dose/Rate Route Frequency Ordered Stop   07/15/18 1700  meropenem (MERREM) 1 g in sodium chloride 0.9 % 100 mL IVPB     1 g 200 mL/hr over 30 Minutes Intravenous Every 8 hours 07/15/18 1620     07/08/18 2300  vancomycin (VANCOCIN) 1,500 mg in sodium chloride 0.9 % 500 mL IVPB  Status:  Discontinued     1,500 mg 250 mL/hr over 120 Minutes Intravenous  Once 07/07/18 2128 07/07/18 2129    07/08/18 2300  vancomycin (VANCOCIN) 1,500 mg in sodium chloride 0.9 % 500 mL IVPB  Status:  Discontinued     1,500 mg 250 mL/hr over 120 Minutes Intravenous Every 24 hours 07/07/18 2129 07/08/18 0726   07/08/18 0600  ceFEPIme (MAXIPIME) 2 g in sodium chloride 0.9 % 100 mL IVPB  Status:  Discontinued     2 g 200 mL/hr over 30 Minutes Intravenous Every 8 hours 07/07/18 2124 07/13/18 1333   07/07/18 2130  vancomycin (VANCOCIN) 1,500 mg in sodium chloride 0.9 % 500 mL IVPB     1,500 mg 250 mL/hr over 120 Minutes Intravenous  Once 07/07/18 2123 07/08/18 0028   07/07/18 2115  vancomycin (VANCOCIN) IVPB 1000 mg/200 mL premix  Status:  Discontinued     1,000 mg 200 mL/hr over 60 Minutes Intravenous  Once 07/07/18 2110 07/07/18 2128   07/07/18 2115  ceFEPIme (MAXIPIME) 2 g in sodium chloride 0.9 % 100 mL IVPB     2 g 200 mL/hr over 30 Minutes Intravenous  Once 07/07/18 2110 07/07/18 2203      Assessment/Plan: A. 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 Derrell Lolling, 4/16 - BID WTD dressing changes Expected post-op ileus. No bowel function throughout colostomy. NPO - Patient pulled NG tube overnight 04/17. Re-insert if develops N/V or abdominal distension. - Mobilize as tolerated. IS/pulm toliet.  FEN- NPO, IVF, K 4.0 VTE -SCDs, heparin  ID -Meropenem 4/15 >> per ID, WBC 26.1, afebrile Foley - Removed POD 1 Follow up -Dr. Derrell Lolling Discussed pt's status and post op course with pt's daughter   LOS: 12 days    [name] 07/19/2018

## 2018-07-19 NOTE — Progress Notes (Addendum)
[facility] [place]  Subjective  - Tolerating min. Liquids.   Objective (!) 126/54 (!) 54 98 F (36.7 C) (Oral) 16 94%  Intake/Output Summary (Last 24 hours) at 07/19/2018 0850 Last data filed at 07/19/2018 0445 Gross per 24 hour  Intake 2277.01 ml  Output 915 ml  Net 1362.01 ml   Abdomin Colostomy in place with abdominal drains B LE palpable pedal pulses, left GT and second toe dry gangrene     Assessment/Planning: Pending stabilization we will plan angiogram for confirmation of perfusion to the GI system. 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 Derrell Lolling on 4/16   [name] 07/19/2018 8:50 AM --  Laboratory Lab Results: Recent Labs    07/18/18 0301 07/19/18 0432  WBC 29.2* 26.1*  HGB 8.7* 7.5*  HCT 29.3* 25.7*  PLT 348 283   BMET Recent Labs    07/18/18 0301 07/19/18 0432  NA 142 140  K 4.0 3.2*  CL 109 109  CO2 24 28  GLUCOSE 129* 138*  BUN 8 7*  CREATININE 0.53 0.40*  CALCIUM 8.0* 7.7*    COAG Lab Results  Component Value Date   INR 1.3 (H) 07/10/2018   INR 1.7 (H) 07/07/2018   INR 1.05 05/17/2009   No results found for: PTT   I have interviewed and examine patient with PA and agree with assessment and plan above. Might benefit from abdominal angiogram prior to discharge with possible intervention of sma stenosis.   Jiro Kiester C. Randie Heinz, MD [facility] [place] Office: [phone] Pager: [phone]

## 2018-07-19 NOTE — Progress Notes (Signed)
PROGRESS NOTE  [name] AVW:[id number] DOB: 10-03-1945 DOA: 07/07/2018 PCP: Junie Spencer, [name]   LOS: 12 days   Brief Narrative / Interim history:  73 year old female with history of chronic N/V/abdominal pain, A. fib, PVD status post right femoral thrombectomy on 05/31/2018, hypertension and chronic back pain presenting with intractable nausea, vomiting, abdominal pain, new onset of diarrhea and about 30 pounds weight loss in the last several months.   She was admitted to [facility] 5 days ago with somnolence and a diagnosis of sepsis. She has had a prior aortobifem by Dr Hart [place] in 2002 and had thrombectomy of the right limb of this by Dr Randie Heinz 3/20. 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 Her left internal iliac artery was patent at that point.   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 Also there is a 3.9 x 1.8 cm and 2.6 x 1.1 cm fluid collection in right pelvis/groin thought to be seroma from prior surgery.   Per H&P, interventional radiology at [facility] was consulted and consideration was being given for stenting her SMA which has about a (30-50% stenosis) to improve mesenteric blood flow.   Patient was transferred to [facility] for further management.  Patient was also treated with cefepime for presumed pneumonia.  Gastroenterology and vascular surgery consulted.  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  Biopsies taken and pending.  She also had flexible sigmoidoscopy that showed only a 7 mm sessile polyp in the  rectum but poor prep.  On 4/15, and continued to have significant leukocytosis, mild temperature and abdominal tenderness.  This raised concern about intra-abdominal occult infection especially with the fluid collections noted on CT abdomen.  Infectious disease consulted and started patient on meropenem.  IR consulted and recommended getting repeat CT abdomen and pelvis which showed frank perforation of the descending colon appears to be contained within a portion of the omentum, or occluded celiac axis, slightly decreased pancreatic tail lesion but no previously noted fluid collections.  General surgery and IR consulted.  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 Derrell Lolling on 4/16  Subjective: No major events overnight of this morning.  No complaints this morning.  Pain fairly controlled.  Denies chest pain or dyspnea.  Does not appear confused but oriented to self and place only.   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 Derrell Lolling, 4/16 -Abdomen is still open -Still with no return of bowel function. -IR and general surgery managing. -VSS on board as well-plan angiogram for SMA stenosis before discharge -Pain control per surgery-PRN morphine, IV  Tylenol and Robaxin. -Hemoglobin down to 7.5, likely at equilibrium now.  We will continue monitoring -TPN feeding started 4/18. -Pharmacy to manage electrolyte abnormalities with TPN. -PRN Zofran  Leukocytosis: Combination of stress and the above.  Improving. -Continue meropenem 4/15--Per ID.  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  Likely from occluded celiac axis -Continue PPI -Vascular surgery as above  Significant weight loss/moderate protein calorie malnutrition: Likely due to poor p.o. intake from the above. -Currently awaiting bowel function to return -TPN as above -Discontinue IV fluid  History of COPD: Stable -Continue DuoNeb  Peripheral vascular disease with dry gangrene -Continue home statin -Outpatient vascular surgery follow-up  Essential hypertension: Fairly controlled. -Continue home meds  Chronic atrial fibrillation: Not in RVR.  [place] vas score 6.  On Eliquis and metoprolol at home. -Heparin and metoprolol  Chronic pain: -Pain medications as above  Tobacco use disorder: -Cessation counseling -Nicotine patch  Scheduled Meds: . atorvastatin  40 mg Oral Daily  . Chlorhexidine Gluconate Cloth  6 each Topical Q0600  . folic acid  1 mg Oral Daily  . insulin aspart  0-9 Units Subcutaneous Q4H  . lidocaine  1 patch Transdermal Q24H  . metoprolol tartrate  75 mg Oral BID  . nicotine  21 mg Transdermal Daily  . pantoprazole (PROTONIX) IV  40 mg Intravenous Q24H  . pregabalin  200 mg Oral TID  . sodium chloride flush  10-40 mL Intracatheter Q12H  . thiamine  100 mg Oral Daily   Or  . thiamine  100 mg Intravenous Daily   Continuous Infusions: . sodium chloride Stopped (07/16/18 0842)  . acetaminophen 1,000 mg (07/19/18 0710)  . heparin 1,350 Units/hr (07/19/18 0445)  . meropenem (MERREM) IV 1 g (07/19/18 8841)  . methocarbamol (ROBAXIN) IV 500 mg (07/19/18 6606)  . potassium chloride 10 mEq (07/19/18 1011)  . potassium  chloride    . potassium PHOSPHATE IVPB (in mmol) 20 mmol (07/19/18 0853)  . TPN ADULT (ION) 35 mL/hr at 07/19/18 0445  . TPN ADULT (ION)     PRN Meds:.sodium chloride, alum & mag hydroxide-simeth, hydrALAZINE, ipratropium-albuterol, labetalol, morphine injection, oxyCODONE, promethazine, sodium chloride flush  DVT prophylaxis: On heparin drip for atrial fibrillation Code Status: Full code Family Communication: None at bedside.  Updated patient's daughter over the phone 4/17. Disposition Plan: Remains inpatient. Critically sick with open abdomen.  On IV antibiotics.  Consultants:   Vascular surgery  Gastroenterology  Infectious disease  Interventional radiology  General surgery  Procedures:   EGD on 4/14  Flex sigmoidoscopy on 4/14  IR drain placement on 4/16 which showed a large left colon defect, 4/16  Ex Lap, LOA, Left sigmoid colectomy, descending colostomy (Hartmann's), Dr. Derrell Lolling, 4/16  Antimicrobials:  Vancomycin on 4/7  Cefepime 4/7-4/12  Meropenem 4/15--  Objective: Vitals:   07/18/18 2145 07/19/18 0000 07/19/18 0415 07/19/18 0500  BP: (!) 150/64 (!) 163/64 (!) 126/54   Pulse: 63 74 (!) 54   Resp:  18 16   Temp:   98 F (36.7 C)   TempSrc:   Oral   SpO2:  96% 94%   Weight:    68.2 kg  Height:        Intake/Output Summary (Last  24 hours) at 07/19/2018 1047 Last data filed at 07/19/2018 0445 Gross per 24 hour  Intake 2277.01 ml  Output 915 ml  Net 1362.01 ml   Filed Weights   07/17/18 0501 07/17/18 1103 07/19/18 0500  Weight: 62.1 kg 62.1 kg 68.2 kg    Examination:  GENERAL: Frail elderly female.  No acute distress. HEENT: MMM.  Vision and Hearing grossly intact.  NECK: Supple.  No JVD.  LUNGS:  No IWOB.  Fair air movement bilaterally. HEART: Occasional PACs.  Heart sounds normal. ABD: No bowel sounds.  Soft.  Surgical wound open and clean.  Colostomy clean and empty. EXT:   no edema bilaterally.  Moves all extremities SKIN: no  apparent skin lesion.  NEURO: Awake, alert and oriented to self and place only.  Does not appear confused.  No gross neuro deficit. PSYCH: Calm. Normal affect.  Data Reviewed: I have independently reviewed following labs and imaging studies  CBC: Recent Labs  Lab 07/15/18 0442 07/16/18 0344 07/17/18 0259 07/18/18 0301 07/19/18 0432  WBC 25.0*  24.6* 21.8* 32.1* 29.2* 26.1*  NEUTROABS 21.9*  --   --   --  22.7*  HGB 10.9*  10.8* 9.8* 10.5* 8.7* 7.5*  HCT 36.8  36.1 31.1* 34.7* 29.3* 25.7*  MCV 84.0  82.6 81.8 85.0 84.0 86.5  PLT 448*  454* 460* 358 348 283   Basic Metabolic Panel: Recent Labs  Lab 07/15/18 0739 07/16/18 0344 07/17/18 0259 07/18/18 0301 07/18/18 1036 07/19/18 0432  NA 136 138 140 142  --  140  K 4.3 4.3 5.7* 4.0  --  3.2*  CL 99 104 107 109  --  109  CO2 24 25 22 24   --  28  GLUCOSE 125* 118* 135* 129*  --  138*  BUN 9 11 13 8   --  7*  CREATININE 0.58 0.56 0.85 0.53  --  0.40*  CALCIUM 8.6* 8.5* 8.3* 8.0*  --  7.7*  MG  --   --   --   --  1.6* 1.7  PHOS  --   --   --   --  1.9* 1.9*   GFR: Estimated Creatinine Clearance: 62.7 mL/min (A) (by C-G formula based on SCr of 0.4 mg/dL (L)). Liver Function Tests: Recent Labs  Lab 07/15/18 0739 07/19/18 0432  AST 28 71*  ALT 13 36  ALKPHOS 121 105  BILITOT 0.7 0.5  PROT 6.0* 4.7*  ALBUMIN 1.8* 1.3*   No results for input(s): LIPASE, AMYLASE in the last 168 hours. No results for input(s): AMMONIA in the last 168 hours. Coagulation Profile: No results for input(s): INR, PROTIME in the last 168 hours. Cardiac Enzymes: No results for input(s): CKTOTAL, CKMB, CKMBINDEX, TROPONINI in the last 168 hours. BNP (last 3 results) No results for input(s): PROBNP in the last 8760 hours. HbA1C: No results for input(s): HGBA1C in the last 72 hours. CBG: Recent Labs  Lab 07/18/18 1628 07/18/18 2005 07/19/18 0024 07/19/18 0417 07/19/18 0810  GLUCAP 91 121* 116* 127* 111*   Lipid Profile: Recent  Labs    07/19/18 0432  TRIG 102   Thyroid Function Tests: No results for input(s): TSH, T4TOTAL, FREET4, T3FREE, THYROIDAB in the last 72 hours. Anemia Panel: No results for input(s): VITAMINB12, FOLATE, FERRITIN, TIBC, IRON, RETICCTPCT in the last 72 hours. Urine analysis:    Component Value Date/Time   COLORURINE AMBER (A) 07/07/2018 1826   APPEARANCEUR HAZY (A) 07/07/2018 1826   LABSPEC 1.014 07/07/2018 1826  PHURINE 5.0 07/07/2018 1826   GLUCOSEU 50 (A) 07/07/2018 1826   HGBUR NEGATIVE 07/07/2018 1826   BILIRUBINUR NEGATIVE 07/07/2018 1826   KETONESUR NEGATIVE 07/07/2018 1826   PROTEINUR NEGATIVE 07/07/2018 1826   NITRITE NEGATIVE 07/07/2018 1826   LEUKOCYTESUR NEGATIVE 07/07/2018 1826   Sepsis Labs: Invalid input(s): PROCALCITONIN, LACTICIDVEN  No results found for this or any previous visit (from the past 240 hour(s)).    Radiology Studies: No results found.  Taye T. [facility] Pager [phone]  If 7PM-7AM, please contact night-coverage [email or web address] Password [facility] 07/19/2018, 10:47 AM

## 2018-07-19 NOTE — Progress Notes (Addendum)
PHARMACY - ADULT TOTAL PARENTERAL NUTRITION CONSULT NOTE   Pharmacy Consult for TPN Indication: Post-op ileus  Patient Measurements: Height: 5\' 7"  (170.2 cm) Weight: 150 lb 5.7 oz (68.2 kg) IBW/kg (Calculated) : 61.6 TPN AdjBW (KG): 62.3 Body mass index is 23.55 kg/m.  Assessment: 50-yo female with h/o chronic N/V/abdominal pain, A. fib, PVD status post right femoral thrombectomy on 05/31/2018, hypertension and chronic back pain presenting with intractable nausea, vomiting, abdominal pain, new onset of diarrhea and about 30 pounds weight loss in the last several months.   She was admitted to [facility] with a diagnosis of sepsis. 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 Also there is a 3.9 x 1.8 cm and 2.6 x 1.1 cm fluid collection in right pelvis/groin thought to be seroma from prior surgery. 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   Patient was transferred to [facility] for further management. S/p upper endoscopy 07/14/18 found Severe ulcerative gastritis most consistent ischemia, biopsied, results pending. CT showed perforation of descending colon. 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 Now with post-op ileus in the setting of 30 pound weight loss and minimal intake since admission.  GI: 2 x JP drains with 115 mls output, 0 mls from colostomy; Prealbumin <5 Endo: No history of diabetes; CBGs 115 - 135 Insulin requirements in the past 24 hours: 0 Lytes: K 3.2 (will replace), Corr Ca 9.9 (Alb 1.3); Phos 1.9  (will replace) Renal: Scr 0.4; BUN 7; UOP 0.5 ml/kg/hr + 1 unmeasured occurance Pulm:RA Cards: HTN 150 - 175/70 - 80; HR 85-95 on heparin drip Hepatobil:AST/ALT 71/36; TBili 0.5; TGs 102 Neuro: Pain 10/10 - not taking morphine ID: WBC 29.2>26.1; afebrile; Meropenem 4/15>>  TPN Access: PICC requested for today (4/18) TPN start date: 4/18 Nutritional Goals (per RD recommendation on 4/18): Kcal:  2000-2200 kcals (32-36 kcal/kg bw) Protein:  112-125g Pro  (1.8-2g/kg bw) Fluid:  2-2.2 L (77ml/kcal)  Goal TPN rate is 83 ml/hr (This TPN will provide 120 g of protein, 269 g of dextrose, and 67 g of lipids which provides 2003  kCals per day, meeting 100% of patient needs)  Current Nutrition:  TPN at 35 ml/hr  Plan:  Continue TPN but increase rate to 83 mL/hr. This TPN will provide 120 g of protein, 269 g of dextrose, and 67 g of lipids which provides 2003  kCals per day, meeting 100% of patient needs Electrolytes in TPN: Standard Add MVI to TPN - trace elements are on back order will only place in TPN on Monday, Wednesday and Friday Initiate sensitive SSI and adjust as needed Replace with KCL 60 meq and KPhos 20 mmol  Monitor TPN labs F/u - patient at risk for refeeding  Jeanella Cara, PharmD, [facility] Clinical Pharmacist Please see AMION for all Pharmacists' Contact Phone Numbers 07/19/2018, 7:35 AM

## 2018-07-19 NOTE — Plan of Care (Signed)
Care plans reviewed and patient is progressing.  

## 2018-07-19 NOTE — Progress Notes (Signed)
ANTICOAGULATION CONSULT NOTE   Pharmacy Consult for heparin  Indication: atrial fibrillation  No Known Allergies  Patient Measurements: Height: 5\' 7"  (170.2 cm) Weight: 150 lb 5.7 oz (68.2 kg) IBW/kg (Calculated) : 61.6  HEPARIN DW (KG): 62.3   Vital Signs: Temp: 98 F (36.7 C) (04/19 0415) Temp Source: Oral (04/19 0415) BP: 126/54 (04/19 0415) Pulse Rate: 54 (04/19 0415)  Labs: Recent Labs    07/17/18 0259 07/17/18 2019 07/18/18 0301 07/18/18 1036 07/19/18 0432  HGB 10.5*  --  8.7*  --  7.5*  HCT 34.7*  --  29.3*  --  25.7*  PLT 358  --  348  --  283  HEPARINUNFRC  --  0.56  --  0.51 0.35  CREATININE 0.85  --  0.53  --  0.40*    Estimated Creatinine Clearance: 62.7 mL/min (A) (by C-G formula based on SCr of 0.4 mg/dL (L)).   Assessment: 73 y.o. female with h/o Afib, Eliquis on hold, for heparin. Last apixaban dose was 10am on 07/08/18.  S/p upper endoscopy 07/14/18 found Severe ulcerative gastritis most consistent ischemia, biopsied, results pending. CT  showed perforation of descending colon. , plan for s/p perc drain placement by IR , expl lap and left sigmoid colectomy with colostomy on 07/16/18.    Heparin drip resumed 4/17, level is currently at goal.  Hgb with slight fall, but no overt bleeding or complications noted.  Goal of Therapy:  Heparin level 0.3-0.7 units/ml Monitor platelets by anticoagulation protocol: Yes   Plan:  Continue IV heparin at current rate. Daily heparin level, CBC, s/s bleeding F/u for transtion back to apixaban after procedures done or stable.  Jenetta Downer, [facility] Clinical Pharmacist Phone [phone]  07/19/2018 8:57 AM

## 2018-07-20 LAB — COMPREHENSIVE METABOLIC PANEL
ALT: 32 U/L (ref 0–44)
AST: 46 U/L — ABNORMAL HIGH (ref 15–41)
Albumin: 1.3 g/dL — ABNORMAL LOW (ref 3.5–5.0)
Alkaline Phosphatase: 143 U/L — ABNORMAL HIGH (ref 38–126)
Anion gap: 9 (ref 5–15)
BUN: 11 mg/dL (ref 8–23)
CO2: 23 mmol/L (ref 22–32)
Calcium: 8 mg/dL — ABNORMAL LOW (ref 8.9–10.3)
Chloride: 103 mmol/L (ref 98–111)
Creatinine, Ser: 0.43 mg/dL — ABNORMAL LOW (ref 0.44–1.00)
GFR calc Af Amer: >60 mL/min (ref >60)
GFR calc non Af Amer: >60 mL/min (ref >60)
Glucose, Bld: 126 mg/dL — ABNORMAL HIGH (ref 70–99)
Potassium: 4.2 mmol/L (ref 3.5–5.1)
Sodium: 135 mmol/L (ref 135–145)
Total Bilirubin: 0.6 mg/dL (ref 0.3–1.2)
Total Protein: 5.2 g/dL — ABNORMAL LOW (ref 6.5–8.1)

## 2018-07-20 LAB — CBC
HCT: 28.8 % — ABNORMAL LOW (ref 36.0–46.0)
Hemoglobin: 8.5 g/dL — ABNORMAL LOW (ref 12.0–15.0)
MCH: 24.6 pg — ABNORMAL LOW (ref 26.0–34.0)
MCHC: 29.5 g/dL — ABNORMAL LOW (ref 30.0–36.0)
MCV: 83.2 fL (ref 80.0–100.0)
Platelets: 295 10*3/uL (ref 150–400)
RBC: 3.46 MIL/uL — ABNORMAL LOW (ref 3.87–5.11)
RDW: 18.6 % — ABNORMAL HIGH (ref 11.5–15.5)
WBC: 21.4 10*3/uL — ABNORMAL HIGH (ref 4.0–10.5)
nRBC: 1.5 % — ABNORMAL HIGH (ref 0.0–0.2)

## 2018-07-20 LAB — DIFFERENTIAL
Abs Immature Granulocytes: 0.15 10*3/uL — ABNORMAL HIGH (ref 0.00–0.07)
Basophils Absolute: 0 10*3/uL (ref 0.0–0.1)
Basophils Relative: 0 %
Eosinophils Absolute: 0.1 10*3/uL (ref 0.0–0.5)
Eosinophils Relative: 1 %
Immature Granulocytes: 1 %
Lymphocytes Relative: 12 %
Lymphs Abs: 2.6 10*3/uL (ref 0.7–4.0)
Monocytes Absolute: 1.3 10*3/uL — ABNORMAL HIGH (ref 0.1–1.0)
Monocytes Relative: 6 %
Neutro Abs: 17.2 10*3/uL — ABNORMAL HIGH (ref 1.7–7.7)
Neutrophils Relative %: 80 %

## 2018-07-20 LAB — GLUCOSE, CAPILLARY
Glucose-Capillary: 124 mg/dL — ABNORMAL HIGH (ref 70–99)
Glucose-Capillary: 127 mg/dL — ABNORMAL HIGH (ref 70–99)
Glucose-Capillary: 131 mg/dL — ABNORMAL HIGH (ref 70–99)
Glucose-Capillary: 135 mg/dL — ABNORMAL HIGH (ref 70–99)
Glucose-Capillary: 136 mg/dL — ABNORMAL HIGH (ref 70–99)
Glucose-Capillary: 138 mg/dL — ABNORMAL HIGH (ref 70–99)

## 2018-07-20 LAB — HEPARIN LEVEL (UNFRACTIONATED)
Heparin Unfractionated: 0.15 IU/mL — ABNORMAL LOW (ref 0.30–0.70)
Heparin Unfractionated: 0.41 IU/mL (ref 0.30–0.70)

## 2018-07-20 LAB — TRIGLYCERIDES: Triglycerides: 65 mg/dL (ref <150)

## 2018-07-20 LAB — MAGNESIUM: Magnesium: 1.5 mg/dL — ABNORMAL LOW (ref 1.7–2.4)

## 2018-07-20 LAB — PHOSPHORUS: Phosphorus: 2.2 mg/dL — ABNORMAL LOW (ref 2.5–4.6)

## 2018-07-20 LAB — PREALBUMIN: Prealbumin: <5 mg/dL — ABNORMAL LOW (ref 18–38)

## 2018-07-20 MED ORDER — POTASSIUM PHOSPHATES 15 MMOLE/5ML IV SOLN
20.0000 mmol | Freq: Once | INTRAVENOUS | Status: AC
  Administered 2018-07-20: 20 mmol via INTRAVENOUS
  Filled 2018-07-20: qty 6.67

## 2018-07-20 MED ORDER — MAGNESIUM SULFATE 2 GM/50ML IV SOLN
2.0000 g | Freq: Once | INTRAVENOUS | Status: AC
  Administered 2018-07-20: 2 g via INTRAVENOUS
  Filled 2018-07-20: qty 50

## 2018-07-20 MED ORDER — TRAVASOL 10 % IV SOLN
INTRAVENOUS | Status: AC
  Administered 2018-07-20: 18:00:00 via INTRAVENOUS
  Filled 2018-07-20: qty 1195.2

## 2018-07-20 NOTE — Progress Notes (Signed)
ANTICOAGULATION CONSULT NOTE   Pharmacy Consult for heparin  Indication: atrial fibrillation  No Known Allergies  Patient Measurements: Height: 5\' 7"  (170.2 cm) Weight: 155 lb 10.3 oz (70.6 kg) IBW/kg (Calculated) : 61.6  HEPARIN DW (KG): 62.3   Vital Signs: Temp: 98.1 F (36.7 C) (04/20 1214) Temp Source: Oral (04/20 1214) BP: 136/64 (04/20 1214) Pulse Rate: 71 (04/20 1214)  Labs: Recent Labs    07/18/18 0301  07/19/18 0432 07/20/18 0530 07/20/18 0536 07/20/18 1418  HGB 8.7*  --  7.5*  --  8.5*  --   HCT 29.3*  --  25.7*  --  28.8*  --   PLT 348  --  283  --  295  --   HEPARINUNFRC  --    < > 0.35 0.15*  --  0.41  CREATININE 0.53  --  0.40*  --  0.43*  --    < > = values in this interval not displayed.    Estimated Creatinine Clearance: 62.7 mL/min (A) (by C-G formula based on SCr of 0.43 mg/dL (L)).   Assessment: 73 y.o. female with h/o Afib, Eliquis on hold, for heparin. Last apixaban dose was 10am on 07/08/18. She is s/p upper endoscopy 07/14/18 and also s/p perc drain placement by IR , expl lap and left sigmoid colectomy with colostomy on 07/16/18.  Plans for possible mesenteric angio later his week -heparin level at goal after increase to 1500 units/hr   Goal of Therapy:  Heparin level 0.3-0.7 units/ml Monitor platelets by anticoagulation protocol: Yes   Plan:  -No heparin changes needed -Daily heparin level and CBC  Harland German, PharmD Clinical Pharmacist **Pharmacist phone directory can now be found on [email or web address] ([email or web address]).  Listed under [facility].

## 2018-07-20 NOTE — Progress Notes (Signed)
[facility] [place]  Subjective  -    Objective 140/70 70 98 F (36.7 C) (Axillary) 20 93%  Intake/Output Summary (Last 24 hours) at 07/20/2018 0757 Last data filed at 07/20/2018 0701 Gross per 24 hour  Intake 1582.77 ml  Output 4040 ml  Net -2457.23 ml    AbdominColostomy in place with abdominal drains B LE palpable pedal pulses, left GT and second toe dry gangrene without change Right LE without pain or tenderness to palpation   Assessment/Planning Pending stabilization we will plan angiogram for confirmation of perfusion to the GI system. 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 Derrell Lolling on 4/16    [name] 07/20/2018 7:57 AM --  Laboratory Lab Results: Recent Labs    07/19/18 0432 07/20/18 0536  WBC 26.1* 21.4*  HGB 7.5* 8.5*  HCT 25.7* 28.8*  PLT 283 295   BMET Recent Labs    07/19/18 0432 07/20/18 0536  NA 140 135  K 3.2* 4.2  CL 109 103  CO2 28 23  GLUCOSE 138* 126*  BUN 7* 11  CREATININE 0.40* 0.43*  CALCIUM 7.7* 8.0*    COAG Lab Results  Component Value Date   INR 1.3 (H) 07/10/2018   INR 1.7 (H) 07/07/2018   INR 1.05 05/17/2009   No results found for: PTT

## 2018-07-20 NOTE — Progress Notes (Addendum)
Assisted PA with abdominal dressing change this am. Will continue to monitor

## 2018-07-20 NOTE — Progress Notes (Signed)
[facility] [place]  Subjective  - pt still NPO now   Objective 136/64 71 98.1 F (36.7 C) (Oral) (!) 21 95%  Intake/Output Summary (Last 24 hours) at 07/20/2018 1409 Last data filed at 07/20/2018 0701 Gross per 24 hour  Intake 252.14 ml  Output 4040 ml  Net -3787.86 ml   Abdomen ostomy pink  Assessment/Planning: Pt s/p colon resection Will consider mesenteric angio after she has made some acute recovery from this event potentially later this week.  Fabienne Bruns 07/20/2018 2:09 PM --  Laboratory Lab Results: Recent Labs    07/19/18 0432 07/20/18 0536  WBC 26.1* 21.4*  HGB 7.5* 8.5*  HCT 25.7* 28.8*  PLT 283 295   BMET Recent Labs    07/19/18 0432 07/20/18 0536  NA 140 135  K 3.2* 4.2  CL 109 103  CO2 28 23  GLUCOSE 138* 126*  BUN 7* 11  CREATININE 0.40* 0.43*  CALCIUM 7.7* 8.0*    COAG Lab Results  Component Value Date   INR 1.3 (H) 07/10/2018   INR 1.7 (H) 07/07/2018   INR 1.05 05/17/2009   No results found for: PTT

## 2018-07-20 NOTE — Progress Notes (Signed)
Patient ID: [name], female   DOB: [birth year], 73 y.o.   MRN: [id number] IR rounding note via telephone due to virus restrictions: spoke with nurse; s/p left abd abscess drain placement 4/16;  pt stable; left abd IR placed drain intact, site ok, drain flushes ok; afebrile; hgb 8.5, WBC 21.4(26.1); cont with current tx/output monitoring; check f/u CT later this week

## 2018-07-20 NOTE — Progress Notes (Signed)
ANTICOAGULATION CONSULT NOTE   Pharmacy Consult for heparin  Indication: atrial fibrillation  No Known Allergies  Patient Measurements: Height: 5\' 7"  (170.2 cm) Weight: 155 lb 10.3 oz (70.6 kg) IBW/kg (Calculated) : 61.6  HEPARIN DW (KG): 62.3   Vital Signs: Temp: 98.6 F (37 C) (04/20 0410) Temp Source: Oral (04/20 0410) BP: 156/66 (04/20 0410) Pulse Rate: 78 (04/20 0410)  Labs: Recent Labs    07/18/18 0301 07/18/18 1036 07/19/18 0432 07/20/18 0530 07/20/18 0536  HGB 8.7*  --  7.5*  --  8.5*  HCT 29.3*  --  25.7*  --  28.8*  PLT 348  --  283  --  295  HEPARINUNFRC  --  0.51 0.35 0.15*  --   CREATININE 0.53  --  0.40*  --  0.43*    Estimated Creatinine Clearance: 62.7 mL/min (A) (by C-G formula based on SCr of 0.43 mg/dL (L)).   Assessment: 73 y.o. female with h/o Afib, Eliquis on hold, for heparin. Last apixaban dose was 10am on 07/08/18.  S/p upper endoscopy 07/14/18 found Severe ulcerative gastritis most consistent ischemia, biopsied, results pending. CT  showed perforation of descending colon. , plan for s/p perc drain placement by IR , expl lap and left sigmoid colectomy with colostomy on 07/16/18.    Heparin level this am 0.15 units/ml.  No bleeding noted Goal of Therapy:  Heparin level 0.3-0.7 units/ml Monitor platelets by anticoagulation protocol: Yes   Plan:  Increase heparin to 1500 units/hr Check heparin level 6-8 hours after rate change Daily heparin level, CBC, s/s bleeding F/u for transtion back to apixaban after procedures done or stable.  Thanks for allowing pharmacy to be a part of this patient's care.  Talbert Cage, PharmD Clinical Pharmacist

## 2018-07-20 NOTE — Progress Notes (Signed)
4 Days Post-Op    CC: perforated colon  Subjective: Pt in bed with a wick in place, bedside commode is in the room, not used.  She has been up to the chair, but not walking much if any.  Ostomy bag is empty, some swelling of the ostomy, but no stool or gas in the bag.  Some sweat in the base of the ostomy bag.  NO BS.  Lower drain is serous, but the upper drain is a brownish colored fluid, mostly clear.    Objective: Vital signs in last 24 hours: Temp:  [98 F (36.7 C)-99 F (37.2 C)] 98 F (36.7 C) (04/20 0749) Pulse Rate:  [54-78] 78 (04/20 0818) Resp:  [18-25] 20 (04/20 0749) BP: (117-162)/(47-126) 142/126 (04/20 0818) SpO2:  [89 %-95 %] 93 % (04/20 0749) Weight:  [70.6 kg] 70.6 kg (04/20 0410) Last BM Date: 07/14/18 100 PO recorded 1600 IV 3900 urine Drains 55 left medidal; 70 left lower quad Stool 10 recorded 2 liters negative Afebrile, VSS BP up some, on 1-2 liter [place] Na 135 creatinine 1.26 Mag 1.5 WBC 32.1(4/17)>>29.2(4/18)>>26.1(4/19)>>21.4(4/20) H/H:  10.5/34.7>>8.7/29.3>>7.5/25.7>>8.5/28.8 today  Intake/Output from previous day: 04/19 0701 - 04/20 0700 In: 1782.8 [P.O.:100; I.V.:757.7; IV Piggyback:925.1] Out: 4035 [Urine:3900; Drains:125; Stool:10] Intake/Output this shift: Total I/O In: -  Out: 5 [Stool:5]  General appearance: alert, cooperative and no distress Resp: clear to auscultation bilaterally and anterior, clear, No IS in the room.   GI: soft, tender.  Mildline picture below.  some fat necrosis and sites have almost  milky like appearance.  Ostomy is swollen and pale, but no necrosis.      Lab Results:  Recent Labs    07/19/18 0432 07/20/18 0536  WBC 26.1* 21.4*  HGB 7.5* 8.5*  HCT 25.7* 28.8*  PLT 283 295    BMET Recent Labs    07/19/18 0432 07/20/18 0536  NA 140 135  K 3.2* 4.2  CL 109 103  CO2 28 23  GLUCOSE 138* 126*  BUN 7* 11  CREATININE 0.40* 0.43*  CALCIUM 7.7* 8.0*   PT/INR No results for input(s): LABPROT, INR in  the last 72 hours.  Recent Labs  Lab 07/15/18 0739 07/19/18 0432 07/20/18 0536  AST 28 71* 46*  ALT 13 36 32  ALKPHOS 121 105 143*  BILITOT 0.7 0.5 0.6  PROT 6.0* 4.7* 5.2*  ALBUMIN 1.8* 1.3* 1.3*     Lipase     Component Value Date/Time   LIPASE 65 (H) 07/08/2018 1325     Medications: . atorvastatin  40 mg Oral Daily  . Chlorhexidine Gluconate Cloth  6 each Topical Q0600  . insulin aspart  0-9 Units Subcutaneous Q4H  . lidocaine  1 patch Transdermal Q24H  . metoprolol tartrate  75 mg Oral BID  . nicotine  21 mg Transdermal Daily  . pantoprazole (PROTONIX) IV  40 mg Intravenous Q24H  . pregabalin  200 mg Oral TID  . sodium chloride flush  10-40 mL Intracatheter Q12H   . sodium chloride Stopped (07/16/18 0842)  . heparin 1,500 Units/hr (07/20/18 16100648)  . magnesium sulfate 1 - 4 g bolus IVPB    . meropenem (MERREM) IV 1 g (07/20/18 0529)  . methocarbamol (ROBAXIN) IV 500 mg (07/20/18 0609)  . potassium PHOSPHATE IVPB (in mmol)    . TPN ADULT (ION) 83 mL/hr at 07/19/18 1707  . TPN ADULT (ION)      Assessment/Plan A. 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 Derrell Lolling, 4/16 POD#4 - BID WTD dressing changes Expected post-op ileus. No bowel function throughout colostomy. NPO - Patient pulled NG tube overnight04/17. Re-insert if develops N/V or abdominal distension. - Mobilize as tolerated. IS/pulm toliet.  FEN- NPO, IVF, TPN (4/19) VTE -SCDs, heparin drip ID -Meropenem 4/15 >> per ID, day 6;  WBC 26.1>> 21.4,  afebrile Foley - Removed POD 1 Follow up -Dr. Derrell Lolling Discussed pt's status and post op course with pt's daughter  Plan:  I ask the nurse to remove the wick and get her to BR or bedside commode.  Increase dressing changes to BID, I showed the nurse how to gently clean sides and base of the wound with wet 4 x 4.  I will get PT and OT to see, she has some bad toes, but lived alone prior to this.  Continue TNA.  I have ask wound care nurse to see also to help with ostomy.       LOS: 13 days    Nakkia Mackiewicz 07/20/2018 [phone]

## 2018-07-20 NOTE — Progress Notes (Signed)
Physical Therapy Treatment Patient Details Name: [name] MRN: [id number] DOB: 1945/08/30 Today's Date: 07/20/2018    History of Present Illness  [name] is a 73 y.o. female with medical history significant of chronic back pain, GERD, hyperlipidemia, hypertension, left knee osteoarthritis, palpitations, PVD h/o Aortofem bypass, s/p RLE thromboembolectomy and R common emoral artery patch angioplasty 05/31/18, history of tobacco use who presents to ER due to fall at home.  patient now s/p EXPLORATORY LAPAROTOMY, lysis of adhesions X 45 minutes LEFT SIGMOID COLECTOMY, SPLENIC FLEXOR MOBILIZATION,DESCENDING COLOSTOMY on 4/16.    PT Comments    Pt admitted with above diagnosis. Pt currently with functional limitations due to balance and endurance deficits. Pt not progressing as she is not feelling well.  Nauseated.  Did get to 3N1 because she was in bed in urine and needed to be cleaned and then to recliner.  Nurse awrare that pt wants nausea meds.  Pt will benefit from skilled PT to increase their independence and safety with mobility to allow discharge to the venue listed below.     Follow Up Recommendations  SNF(HHPT if progresses well )     Equipment Recommendations  Other (comment)(TBD )    Recommendations for Other Services       Precautions / Restrictions Precautions Precautions: Fall Restrictions Weight Bearing Restrictions: No    Mobility  Bed Mobility Overal bed mobility: Needs Assistance Bed Mobility: Supine to Sit     Supine to sit: Min assist     General bed mobility comments: Pt soaked with urine on arrrival and asking to get on 3N1.    Transfers Overall transfer level: Needs assistance Equipment used: 2 person hand held assist;Rolling walker (2 wheeled) Transfers: Sit to/from Stand Sit to Stand: Min guard;Min assist Stand pivot transfers: Min assist       General transfer comment: no assist needed for standing, but steadying assist once up. Pt  held onto PT to pivot to 3N1.  Used RW to pivot from 3N1 to recliner with min assist for steadying and safety. Had to assist to clean pt once she urinated.    Ambulation/Gait                 Stairs             Wheelchair Mobility    Modified Rankin (Stroke Patients Only)       Balance Overall balance assessment: Needs assistance Sitting-balance support: Feet supported;Bilateral upper extremity supported Sitting balance-Leahy Scale: Poor Sitting balance - Comments: needed UE support for balance   Standing balance support: During functional activity;Bilateral upper extremity supported Standing balance-Leahy Scale: Poor Standing balance comment: mildly unsteady, needed assist to be steady in standing.                            Cognition Arousal/Alertness: Awake/alert Behavior During Therapy: WFL for tasks assessed/performed Overall Cognitive Status: Impaired/Different from baseline                                 General Comments: patient unable to follow cues well, reporting high pain      Exercises General Exercises - Lower Extremity Ankle Circles/Pumps: AROM;10 reps;Supine;Both Long Arc Quad: AROM;Both;5 reps;Seated    General Comments General comments (skin integrity, edema, etc.): VSS      Pertinent Vitals/Pain Pain Assessment: Faces Faces Pain Scale: Hurts whole lot Pain  Location: stomach Pain Descriptors / Indicators: Burning;Discomfort Pain Intervention(s): Limited activity within patient's tolerance;Monitored during session;Repositioned    Home Living                      Prior Function            PT Goals (current goals can now be found in the care plan section) Acute Rehab PT Goals Patient Stated Goal: return home with family to assist Progress towards PT goals: Progressing toward goals    Frequency    Min 2X/week      PT Plan Current plan remains appropriate;Frequency needs to be updated     Co-evaluation              AM-PAC PT "6 Clicks" Mobility   Outcome Measure  Help needed turning from your back to your side while in a flat bed without using bedrails?: A Lot Help needed moving from lying on your back to sitting on the side of a flat bed without using bedrails?: A Lot Help needed moving to and from a bed to a chair (including a wheelchair)?: A Lot Help needed standing up from a chair using your arms (e.g., wheelchair or bedside chair)?: A Lot Help needed to walk in hospital room?: Total Help needed climbing 3-5 steps with a railing? : Total 6 Click Score: 10    End of Session Equipment Utilized During Treatment: Gait belt;Oxygen Activity Tolerance: Patient limited by pain;Patient limited by fatigue(limited by nausea) Patient left: with call bell/phone within reach;in chair;with chair alarm set Nurse Communication: Mobility status(pt requests nausea meds) PT Visit Diagnosis: Unsteadiness on feet (R26.81);Other abnormalities of gait and mobility (R26.89);Difficulty in walking, not elsewhere classified (R26.2)     Time: 5409-81191154-1211 PT Time Calculation (min) (ACUTE ONLY): 17 min  Charges:  $Therapeutic Activity: 8-22 mins                     Arhaan Chesnut,PT Acute Rehabilitation Services Pager:  [phone]  Office:  [phone]     Berline LopesDawn F Booker Bhatnagar 07/20/2018, 1:46 PM

## 2018-07-20 NOTE — Consult Note (Addendum)
WOC Nurse ostomy consult note Surgical team following for assessment and plan of care for abd wound.   Stoma type/location: RUQ, descending colostomy Stomal assessment/size:  1 3/4 inches, red and viable, slightly above skin level Peristomal assessment: intact skin surrounding  Output: small amt tan liquid, no stool or flatus Ostomy pouching: 2pc.2 3/4" system  Education provided:  Demonstrated pouch change using 2 piece pouch.  Pt asked appropriate questions and was able to open and close velcro to empty.   Education booklet and information at the bedside She reports she lives at home alone and has a sister that helps her.  Enrolled patient in [employer] DC program: Yes WOC team will continue to perform teaching sessions while in the hospital.  Pt could benefit from home health assistance after discharge so family members can obtain educational sessions at that time, since visitors are not present in the hospital. Cammie Mcgee MSN, RN, [place], [place], CNS [phone]

## 2018-07-20 NOTE — Progress Notes (Addendum)
Abdominal Dressing change done per MD order at this time. Patient tolerated well.   Ernestina Columbia, RN

## 2018-07-20 NOTE — Progress Notes (Signed)
PHARMACY - ADULT TOTAL PARENTERAL NUTRITION CONSULT NOTE   Pharmacy Consult for TPN Indication: Post-op ileus  Patient Measurements: Height: 5\' 7"  (170.2 cm) Weight: 155 lb 10.3 oz (70.6 kg) IBW/kg (Calculated) : 61.6 TPN AdjBW (KG): 62.3 Body mass index is 24.38 kg/m.  Assessment: 46-yo female with h/o chronic N/V/abdominal pain, A. fib, PVD status post right femoral thrombectomy on 05/31/2018, hypertension and chronic back pain presenting with intractable nausea, vomiting, abdominal pain, new onset of diarrhea and about 30 pounds weight loss in the last several months.   She was admitted to [facility] with a diagnosis of sepsis. 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 Also there is a 3.9 x 1.8 cm and 2.6 x 1.1 cm fluid collection in right pelvis/groin thought to be seroma from prior surgery. 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   Patient was transferred to [facility] for further management. S/p upper endoscopy 07/14/18 found Severe ulcerative gastritis most consistent ischemia, biopsied, results pending. CT showed perforation of descending colon. 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 Now with post-op ileus in the setting of 30 pound weight loss and minimal intake since admission.  GI: 2 x JP drains with 125 mls output, 0 mls from colostomy; Prealbumin <5 Endo: No history of diabetes; CBGs 126 - 135 Insulin requirements in the past 24 hours: 3 Lytes: K 4.2, Corr Ca 10.2 (Alb 1.3); Phos 2.2 (will replace),  Mag 1.5 (will replace) Renal: Scr 0.43; BUN 11; UOP 2.3 ml/kg/hr Pulm:RA Cards: HTN 150 - 175/70 - 80; HR 85-95 on heparin drip Hepatobil:AST/ALT 46/32; TBili 0.6; TGs 65 Neuro: No complaints. Pain fairly controlled. ID: WBC 29.2>26.1>21.4; afebrile; Meropenem 4/15>>  TPN Access: PICC requested for today (4/18) TPN start date: 4/18 Nutritional Goals (per RD recommendation on 4/18): Kcal:  2000-2200 kcals (32-36 kcal/kg bw) Protein:  112-125g Pro  (1.8-2g/kg bw) Fluid:  2-2.2 L (66ml/kcal)  Goal TPN rate is 83 ml/hr (This TPN will provide 120 g of protein, 269 g of dextrose, and 67 g of lipids which provides 2003  kCals per day, meeting 100% of patient needs)  Current Nutrition:  TPN at 35 ml/hr  Plan:  Continue TPN at 83 mL/hr. This TPN will provide 120 g of protein, 269 g of dextrose, and 67 g of lipids which provides 2003  kCals per day, meeting 100% of patient needs Electrolytes in TPN: Standard Add MVI to TPN - trace elements are on back order will only place in TPN on Monday, Wednesday and Friday Initiate sensitive SSI and adjust as needed Replace with KPhos 20 mmol and Mag 2 gms   Will d/c thiamine and folate and place in TPN Monitor TPN labs Mon and Thurs.  F/u - BMP Mag and Phos on Tuesday.  Jeanella Cara, PharmD, [facility] Clinical Pharmacist Please see AMION for all Pharmacists' Contact Phone Numbers 07/20/2018, 8:12 AM

## 2018-07-20 NOTE — Progress Notes (Signed)
PROGRESS NOTE  [name]:[id number] DOB: Aug 01, 1945 DOA: 07/07/2018 PCP: Junie Spencer, [name]   LOS: 13 days   Brief Narrative / Interim history:  73 year old female with history of chronic N/V/abdominal pain, A. fib, PVD status post right femoral thrombectomy on 05/31/2018, hypertension and chronic back pain presenting with intractable nausea, vomiting, abdominal pain, new onset of diarrhea and about 30 pounds weight loss in the last several months.   She was admitted to [facility] 5 days ago with somnolence and a diagnosis of sepsis. She has had a prior aortobifem by Dr Hart [place] in 2002 and had thrombectomy of the right limb of this by Dr Randie Heinz 3/20. 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 Her left internal iliac artery was patent at that point.   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 Also there is a 3.9 x 1.8 cm and 2.6 x 1.1 cm fluid collection in right pelvis/groin thought to be seroma from prior surgery.   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   Patient was transferred to [facility] for further management.  Patient was also treated with cefepime for presumed pneumonia.  Gastroenterology and vascular surgery consulted.  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  Biopsies taken and pending.  She also had flexible sigmoidoscopy that showed only a 7 mm sessile polyp in the  rectum but poor prep.  On 4/15, and continued to have significant leukocytosis, mild temperature and abdominal tenderness.  This raised concern about intra-abdominal occult infection especially with the fluid collections noted on CT abdomen.  Infectious disease consulted and started patient on meropenem.  IR consulted and recommended getting repeat CT abdomen and pelvis which showed frank perforation of the descending colon appears to be contained within a portion of the omentum, or occluded celiac axis, slightly decreased pancreatic tail lesion but no previously noted fluid collections.  General surgery and IR consulted.  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 Derrell Lolling on 4/16.  Abdomen left open out of concern for infectious process and ileus.  Patient was started on TPN on 07/18/2018.  Subjective: No major events overnight of this morning.  No complaints this morning.  Pain fairly controlled.  Denies chest pain, dyspnea or urinary symptoms.  No output in colostomy bag.  55 and 70 cc output from JP drains.  About 10 cc output from colostomy.  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 Derrell Lolling, 4/16 -Abdomen is still open. -Still with no return  of bowel function. -IR and general surgery managing. -VSS on board as well-plan angiogram for SMA stenosis before discharge -Pain control per surgery-PRN morphine, IV Tylenol and Robaxin. -Hemoglobin 8.5.  Leukocytosis improved. -TPN feeding started 4/18. -Pharmacy to manage electrolyte abnormalities with TPN. -PRN Zofran  Leukocytosis: Combination of stress and the above.  Improving. -Continue meropenem 4/15--Per ID.  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  Likely from occluded celiac axis -Continue PPI -Vascular surgery as above  Significant weight loss/moderate protein calorie malnutrition: Likely due to poor p.o. intake from the above. -Currently awaiting bowel function to return -TPN as above  History of COPD: Stable -Continue DuoNeb  Peripheral vascular disease with dry gangrene: Stable. -Continue home statin -Outpatient vascular surgery follow-up  Essential hypertension: Fairly controlled. -Continue home meds  Chronic atrial fibrillation: Not in RVR.  [place] vas score 6.  On Eliquis and metoprolol at home. -Heparin and metoprolol  Chronic pain: -Pain medications as above  Tobacco use disorder: -Cessation counseling -Nicotine patch  Scheduled Meds: . atorvastatin  40 mg Oral Daily  . Chlorhexidine Gluconate Cloth  6 each Topical Q0600  . insulin aspart  0-9 Units Subcutaneous Q4H  . lidocaine  1 patch Transdermal Q24H  . metoprolol tartrate  75 mg Oral BID  . nicotine  21 mg Transdermal Daily  . pantoprazole (PROTONIX) IV  40 mg Intravenous Q24H  . pregabalin  200 mg Oral TID  . sodium chloride flush  10-40 mL Intracatheter Q12H   Continuous Infusions: . sodium chloride Stopped (07/16/18 0842)  . heparin 1,500 Units/hr (07/20/18 1245)  . magnesium sulfate 1 - 4 g bolus IVPB 2 g (07/20/18 1006)  . meropenem (MERREM) IV 1 g (07/20/18 0529)  . methocarbamol (ROBAXIN) IV 500 mg (07/20/18 0609)  . potassium PHOSPHATE IVPB (in mmol) 20  mmol (07/20/18 0947)  . TPN ADULT (ION) 83 mL/hr at 07/19/18 1707  . TPN ADULT (ION)     PRN Meds:.sodium chloride, alum & mag hydroxide-simeth, hydrALAZINE, ipratropium-albuterol, labetalol, morphine injection, oxyCODONE, promethazine, sodium chloride flush  DVT prophylaxis: On heparin drip for atrial fibrillation Code Status: Full code Family Communication: None at bedside.  Disposition Plan: Remains inpatient.  Still with open abdomen.  She is on IV Remeron  Consultants:   Vascular surgery  Gastroenterology  Infectious disease  Interventional radiology  General surgery  Procedures:   EGD on 4/14  Flex sigmoidoscopy on 4/14  IR drain placement on 4/16 which showed a large left colon defect, 4/16  Ex Lap, LOA, Left sigmoid colectomy, descending colostomy (Hartmann's), Dr. Derrell Lolling, 4/16  Antimicrobials:  Vancomycin on 4/7  Cefepime 4/7-4/12  Meropenem 4/15--  Objective: Vitals:   07/20/18 0705 07/20/18 0710 07/20/18 0749 07/20/18 0818  BP:   140/70 (!) 142/126  Pulse: 72 74 70 78  Resp: (!) 23 (!) 23 20   Temp:   98 F (36.7 C)   TempSrc:   Axillary   SpO2: 90% 90% 93%   Weight:      Height:        Intake/Output Summary (Last 24 hours) at 07/20/2018 1033 Last data filed at 07/20/2018 0701 Gross per 24 hour  Intake 980.88 ml  Output  4040 ml  Net -3059.12 ml   Filed Weights   07/17/18 1103 07/19/18 0500 07/20/18 0410  Weight: 62.1 kg 68.2 kg 70.6 kg    Examination:  GENERAL: Frail elderly female.  No acute distress. HEENT: MMM.  Vision and Hearing grossly intact.  NECK: Supple.  No JVD.  LUNGS:  No IWOB. Good air movement. CTAB.  HEART:  RRR. Heart sounds normal.  ABD: Minimal bowel sound.  Soft.  Surgical wound dressing clean and dry.  Colostomy clean and empty. EXT: Moves all extremities.  No edema bilaterally. SKIN: no apparent skin lesion.  NEURO: Awake, alert and oriented to self, place and year.  No gross neuro deficit. PSYCH: Calm.  Normal affect.  Data Reviewed: I have independently reviewed following labs and imaging studies  CBC: Recent Labs  Lab 07/15/18 0442 07/16/18 0344 07/17/18 0259 07/18/18 0301 07/19/18 0432 07/20/18 0536  WBC 25.0*  24.6* 21.8* 32.1* 29.2* 26.1* 21.4*  NEUTROABS 21.9*  --   --   --  22.7* 17.2*  HGB 10.9*  10.8* 9.8* 10.5* 8.7* 7.5* 8.5*  HCT 36.8  36.1 31.1* 34.7* 29.3* 25.7* 28.8*  MCV 84.0  82.6 81.8 85.0 84.0 86.5 83.2  PLT 448*  454* 460* 358 348 283 295   Basic Metabolic Panel: Recent Labs  Lab 07/16/18 0344 07/17/18 0259 07/18/18 0301 07/18/18 1036 07/19/18 0432 07/20/18 0536  NA 138 140 142  --  140 135  K 4.3 5.7* 4.0  --  3.2* 4.2  CL 104 107 109  --  109 103  CO2 25 22 24   --  28 23  GLUCOSE 118* 135* 129*  --  138* 126*  BUN 11 13 8   --  7* 11  CREATININE 0.56 0.85 0.53  --  0.40* 0.43*  CALCIUM 8.5* 8.3* 8.0*  --  7.7* 8.0*  MG  --   --   --  1.6* 1.7 1.5*  PHOS  --   --   --  1.9* 1.9* 2.2*   GFR: Estimated Creatinine Clearance: 62.7 mL/min (A) (by C-G formula based on SCr of 0.43 mg/dL (L)). Liver Function Tests: Recent Labs  Lab 07/15/18 0739 07/19/18 0432 07/20/18 0536  AST 28 71* 46*  ALT 13 36 32  ALKPHOS 121 105 143*  BILITOT 0.7 0.5 0.6  PROT 6.0* 4.7* 5.2*  ALBUMIN 1.8* 1.3* 1.3*   No results for input(s): LIPASE, AMYLASE in the last 168 hours. No results for input(s): AMMONIA in the last 168 hours. Coagulation Profile: No results for input(s): INR, PROTIME in the last 168 hours. Cardiac Enzymes: No results for input(s): CKTOTAL, CKMB, CKMBINDEX, TROPONINI in the last 168 hours. BNP (last 3 results) No results for input(s): PROBNP in the last 8760 hours. HbA1C: No results for input(s): HGBA1C in the last 72 hours. CBG: Recent Labs  Lab 07/19/18 1632 07/19/18 2001 07/20/18 0014 07/20/18 0416 07/20/18 0746  GLUCAP 105* 129* 131* 135* 136*   Lipid Profile: Recent Labs    07/19/18 0432 07/20/18 0530  TRIG 102 65    Thyroid Function Tests: No results for input(s): TSH, T4TOTAL, FREET4, T3FREE, THYROIDAB in the last 72 hours. Anemia Panel: No results for input(s): VITAMINB12, FOLATE, FERRITIN, TIBC, IRON, RETICCTPCT in the last 72 hours. Urine analysis:    Component Value Date/Time   COLORURINE AMBER (A) 07/07/2018 1826   APPEARANCEUR HAZY (A) 07/07/2018 1826   LABSPEC 1.014 07/07/2018 1826   PHURINE 5.0 07/07/2018 1826   GLUCOSEU 50 (A) 07/07/2018 1826  HGBUR NEGATIVE 07/07/2018 1826   BILIRUBINUR NEGATIVE 07/07/2018 1826   KETONESUR NEGATIVE 07/07/2018 1826   PROTEINUR NEGATIVE 07/07/2018 1826   NITRITE NEGATIVE 07/07/2018 1826   LEUKOCYTESUR NEGATIVE 07/07/2018 1826   Sepsis Labs: Invalid input(s): PROCALCITONIN, LACTICIDVEN  No results found for this or any previous visit (from the past 240 hour(s)).    Radiology Studies: No results found.  [facility] Pager [phone]  If 7PM-7AM, please contact night-coverage [email or web address] Password [facility] 07/20/2018, 10:33 AM

## 2018-07-21 LAB — BASIC METABOLIC PANEL
Anion gap: 8 (ref 5–15)
BUN: 13 mg/dL (ref 8–23)
CO2: 23 mmol/L (ref 22–32)
Calcium: 8.1 mg/dL — ABNORMAL LOW (ref 8.9–10.3)
Chloride: 103 mmol/L (ref 98–111)
Creatinine, Ser: 0.34 mg/dL — ABNORMAL LOW (ref 0.44–1.00)
GFR calc Af Amer: >60 mL/min (ref >60)
GFR calc non Af Amer: >60 mL/min (ref >60)
Glucose, Bld: 104 mg/dL — ABNORMAL HIGH (ref 70–99)
Potassium: 4.5 mmol/L (ref 3.5–5.1)
Sodium: 134 mmol/L — ABNORMAL LOW (ref 135–145)

## 2018-07-21 LAB — HEPARIN LEVEL (UNFRACTIONATED)
Heparin Unfractionated: 0.13 IU/mL — ABNORMAL LOW (ref 0.30–0.70)
Heparin Unfractionated: 0.34 IU/mL (ref 0.30–0.70)

## 2018-07-21 LAB — CBC
HCT: 27.4 % — ABNORMAL LOW (ref 36.0–46.0)
Hemoglobin: 8.3 g/dL — ABNORMAL LOW (ref 12.0–15.0)
MCH: 25.3 pg — ABNORMAL LOW (ref 26.0–34.0)
MCHC: 30.3 g/dL (ref 30.0–36.0)
MCV: 83.5 fL (ref 80.0–100.0)
Platelets: 267 10*3/uL (ref 150–400)
RBC: 3.28 MIL/uL — ABNORMAL LOW (ref 3.87–5.11)
RDW: 18.3 % — ABNORMAL HIGH (ref 11.5–15.5)
WBC: 19.6 10*3/uL — ABNORMAL HIGH (ref 4.0–10.5)
nRBC: 2.5 % — ABNORMAL HIGH (ref 0.0–0.2)

## 2018-07-21 LAB — GLUCOSE, CAPILLARY
Glucose-Capillary: 117 mg/dL — ABNORMAL HIGH (ref 70–99)
Glucose-Capillary: 118 mg/dL — ABNORMAL HIGH (ref 70–99)
Glucose-Capillary: 119 mg/dL — ABNORMAL HIGH (ref 70–99)
Glucose-Capillary: 123 mg/dL — ABNORMAL HIGH (ref 70–99)
Glucose-Capillary: 130 mg/dL — ABNORMAL HIGH (ref 70–99)
Glucose-Capillary: 136 mg/dL — ABNORMAL HIGH (ref 70–99)

## 2018-07-21 LAB — PHOSPHORUS: Phosphorus: 2.9 mg/dL (ref 2.5–4.6)

## 2018-07-21 LAB — MAGNESIUM: Magnesium: 1.9 mg/dL (ref 1.7–2.4)

## 2018-07-21 MED ORDER — MAGNESIUM SULFATE IN D5W 1-5 GM/100ML-% IV SOLN
1.0000 g | Freq: Once | INTRAVENOUS | Status: AC
  Administered 2018-07-21: 10:00:00 1 g via INTRAVENOUS
  Filled 2018-07-21: qty 100

## 2018-07-21 MED ORDER — TRAVASOL 10 % IV SOLN
INTRAVENOUS | Status: AC
  Administered 2018-07-21: 18:00:00 via INTRAVENOUS
  Filled 2018-07-21: qty 1195.2

## 2018-07-21 NOTE — Progress Notes (Signed)
Pharmacy Antibiotic Note  [name] is a 73 y.o. female admitted on 07/07/2018 with pneumonia.  . Completed 7 days cefepime for PNA/sepsis.  Patient with leukocytosis and intra-abdominal fluid collections. ID service ordered on 4/15 to start meropenem for descending colon perforation.  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 Derrell Lolling on 4/16 Afebrile, WBC 26.1>21.4>19.6, Scr stable at 0.34. Awaiting bowel function return.  Plan: Continue Meropenem 1g IV q8 hours  Height: 5\' 7"  (170.2 cm) Weight: 144 lb 6.4 oz (65.5 kg) IBW/kg (Calculated) : 61.6  Temp (24hrs), Avg:98.3 F (36.8 C), Min:98.1 F (36.7 C), Max:98.4 F (36.9 C)  Recent Labs  Lab 07/17/18 0259 07/18/18 0301 07/19/18 0432 07/20/18 0536 07/21/18 0541  WBC 32.1* 29.2* 26.1* 21.4* 19.6*  CREATININE 0.85 0.53 0.40* 0.43* 0.34*    Estimated Creatinine Clearance: 62.7 mL/min (A) (by C-G formula based on SCr of 0.34 mg/dL (L)).    No Known Allergies  Antimicrobials this admission: Cefepime 4/7 >> 4/13 Vanco 4/7 >> 4/8 Meropenem 4/15>   Microbiology results: 4/7 BCx x 2: negative 4/8 mrsa pcr negative    Thank you for allowing pharmacy to be a part of this patient's care.  Noah Delaine, RPh Clinical Pharmacist [phone] Please check AMION for all [facility] Pharmacy phone numbers After 10:00 PM, call Main Pharmacy [phone]  07/21/2018 9:58 AM

## 2018-07-21 NOTE — Progress Notes (Signed)
ANTICOAGULATION CONSULT NOTE   Pharmacy Consult for heparin  Indication: atrial fibrillation  No Known Allergies  Patient Measurements: Height: 5\' 7"  (170.2 cm) Weight: 144 lb 6.4 oz (65.5 kg) IBW/kg (Calculated) : 61.6  HEPARIN DW (KG): 62.3   Vital Signs: Temp: 98.3 F (36.8 C) (04/21 0841) Temp Source: Oral (04/21 0841) BP: 158/68 (04/21 0841) Pulse Rate: 78 (04/21 0841)  Labs: Recent Labs    07/19/18 0432  07/20/18 0536 07/20/18 1418 07/21/18 0541 07/21/18 0844  HGB 7.5*  --  8.5*  --  8.3*  --   HCT 25.7*  --  28.8*  --  27.4*  --   PLT 283  --  295  --  267  --   HEPARINUNFRC 0.35   < >  --  0.41 0.13* 0.34  CREATININE 0.40*  --  0.43*  --  0.34*  --    < > = values in this interval not displayed.    Estimated Creatinine Clearance: 62.7 mL/min (A) (by C-G formula based on SCr of 0.34 mg/dL (L)).   Assessment: 73 y.o. female with h/o Afib, Eliquis on hold, for heparin. Last apixaban dose was 10am on 07/08/18. She is s/p upper endoscopy 07/14/18 and also s/p perc drain placement by IR , expl lap and left sigmoid colectomy with colostomy on 07/16/18.  Plans for possible mesenteric angio later his week.   Heparin level reported this AM as 0.13 on heparin 1500 units/hr.  No issues with heparin infusion per RN.   Heparin level previously therapeutic on same rate.  After discussion with the nurse, it is possible that heparin level was drawn by RN from central line as "unit to collect" and heparin drip likely turned off then line flushed x 3 before lab draw.  This may account for low HL.   Thus rechecked HL STAT as a peripheral stick.  Repeat HL is 0.34, therapeutic on current heparin rate  1500 units/hr.  H/H low stable, pltc wnl.  No bleeding noted. No change in heparin needed.   Goal of Therapy:  Heparin level 0.3-0.7 units/ml Monitor platelets by anticoagulation protocol: Yes   Plan:  Continue heparin drip at 1500 units/hr -Daily heparin level and CBC  Noah Delaine, RPh Clinical Pharmacist **Pharmacist phone directory can now be found on [email or web address] ([email or web address]).  Listed under [facility].

## 2018-07-21 NOTE — Care Management Important Message (Signed)
Important Message  Patient Details  Name: [name] MRN: [id number] Date of Birth: Oct 15, 1945   Medicare Important Message Given:  Yes    Dorena Bodo 07/21/2018, 2:27 PM

## 2018-07-21 NOTE — Progress Notes (Signed)
Abdominal dressing change performed per MD orders without difficulty.  JP drains flushed without difficulty.  Patient tolerated well.  Will continue to monitor.

## 2018-07-21 NOTE — Progress Notes (Signed)
PHARMACY - ADULT TOTAL PARENTERAL NUTRITION CONSULT NOTE   Pharmacy Consult for TPN Indication: Post-op ileus  Patient Measurements: Height: 5\' 7"  (170.2 cm) Weight: 144 lb 6.4 oz (65.5 kg) IBW/kg (Calculated) : 61.6 TPN AdjBW (KG): 62.3 Body mass index is 22.62 kg/m.  Assessment: 29-yo female with h/o chronic N/V/abdominal pain, A. fib, PVD status post right femoral thrombectomy on 05/31/2018, hypertension and chronic back pain presenting with intractable nausea, vomiting, abdominal pain, new onset of diarrhea and about 30 pounds weight loss in the last several months.   She was admitted to [facility] with a diagnosis of sepsis. 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 Also there is a 3.9 x 1.8 cm and 2.6 x 1.1 cm fluid collection in right pelvis/groin thought to be seroma from prior surgery. 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   Patient was transferred to [facility] for further management. S/p upper endoscopy 07/14/18 found Severe ulcerative gastritis most consistent ischemia, biopsied, results pending. CT showed perforation of descending colon. 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 Now with post-op ileus in the setting of 30 pound weight loss and minimal intake since admission.  GI: 2 x JP drains with 45 mls output, 5 mls from colostomy; Prealbumin <5; LBM 4/14. NGT pulled out 4/17- no plan to replace unless develops N/V or abdominal distension. Endo: No history of diabetes; CBGs <180 Insulin requirements  in the past 24 hours: 5 Lytes: Na slightly low at 134; Corr Ca upper end at 10.3 (Alb 1.3); K 4.5, Phos 2.9, Mag 1.9 after replacement 4/20. Renal: Scr 0.34; BUN 13; UOP 1 ml/kg/hr  Pulm: 2L [place] Cards: HTN 150 - 175/70 - 80; HR 70s on metoprolol and heparin drip Hepatobil:AST/ALT 46/32; TBili 0.6; TGs 65 Neuro: No complaints. Pain fairly controlled. ID: WBC down to 19.6; afebrile; Meropenem 4/15>>  TPN Access: PICC requested for today (4/18) TPN start date: 4/18 Nutritional Goals (per RD recommendation on 4/18): Kcal:  2000-2200 kcals (32-36 kcal/kg bw) Protein:  112-125g Pro  (1.8-2g/kg bw) Fluid:  2-2.2 L (21ml/kcal)  Goal TPN rate is 83 ml/hr (This TPN will provide 120 g of protein, 269 g of dextrose, and 67 g of lipids which provides 2003  kCals per day, meeting 100% of patient needs)  Current Nutrition:  NPO except sips with meds TPN at 83 ml/hr  Plan:  Continue TPN at 83 mL/hr. This TPN will provide 120 g of protein, 269 g of dextrose, and 67 g of lipids which provides 2003  kCals per day, meeting 100% of patient needs Electrolytes in TPN: Standard Add MVI to TPN - trace elements are on back order will only place in TPN on Monday, Wednesday and Friday Magnesium 1g IV x1 today Continue sensitive SSI and adjust as needed Add Thiamine and Folate toTPN Monitor TPN labs Mon and Thurs.  F/u daily BMP as ordered by MD  Link Snuffer, PharmD, BCPS, BCCCP Clinical Pharmacist Please see AMION for all Pharmacists' Contact Phone Numbers 07/21/2018, 8:04 AM

## 2018-07-21 NOTE — Progress Notes (Signed)
Patient ID: [name], female   DOB: June 25, 1945, 73 y.o.   MRN: [id number]   IR Round note via phone Due to new regulations  IR placed pelvic abs drain 4/16 Then to OR - s/p Ex Lap, LOA, Left sigmoid colectomy, descending colostomy (Hartmann's), Dr. Derrell Lolling, 4/16  Afeb Pt now has 2 drains in place Surgical drain and IR Drain Surgical drain with little OP per RN IR drain sl more  Both flush easily NT sites are clean and dry

## 2018-07-21 NOTE — Progress Notes (Addendum)
   Relatively comfortable, Afebrile.   Pending stabilization plan angiogram in the future s/p colon resection patient with retained aortobifem .  She is receiving TPN and remains NPO   [name] PA-C   Patient currently resting.  Still with elevated white count, although that has trended down slightly.  Continues to deal with postoperative ileus.  We will plan for angiography and further evaluation of superior mesenteric artery once she starts to improve.  [name]

## 2018-07-21 NOTE — Progress Notes (Signed)
PROGRESS NOTE  [name] VWU:[id number] DOB: March 28, 1946 DOA: 07/07/2018 PCP: Junie Spencer, [name]   LOS: 14 days   Brief Narrative / Interim history:  73 year old female with history of chronic N/V/abdominal pain, A. fib, PVD status post right femoral thrombectomy on 05/31/2018, hypertension and chronic back pain presenting with intractable nausea, vomiting, abdominal pain, new onset of diarrhea and about 30 pounds weight loss in the last several months.   She was admitted to [facility] 5 days ago with somnolence and a diagnosis of sepsis. She has had a prior aortobifem by Dr Hart Rochester in 2002 and had thrombectomy of the right limb of this by Dr Randie Heinz 3/20. 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 Her left internal iliac artery was patent at that point.   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 Also there is a 3.9 x 1.8 cm and 2.6 x 1.1 cm fluid collection in right pelvis/groin thought to be seroma from prior surgery.   Per H&P, interventional radiology at [facility] was consulted and consideration was being given for stenting her SMA which has about a (30-50% stenosis) to improve mesenteric blood flow.   Patient was transferred to [facility] for further management.  Patient was also treated with cefepime for presumed pneumonia.  Gastroenterology and vascular surgery consulted.  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  Biopsies taken and pending.  She also had flexible sigmoidoscopy that showed only a 7 mm sessile polyp in the  rectum but poor prep.  On 4/15, and continued to have significant leukocytosis, mild temperature and abdominal tenderness.  This raised concern about intra-abdominal occult infection especially with the fluid collections noted on CT abdomen.  Infectious disease consulted and started patient on meropenem.  IR consulted and recommended getting repeat CT abdomen and pelvis which showed frank perforation of the descending colon appears to be contained within a portion of the omentum, or occluded celiac axis, slightly decreased pancreatic tail lesion but no previously noted fluid collections.  General surgery and IR consulted.  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 Derrell Lolling on 4/16.  Abdomen left open out of concern for infectious process and ileus.  Patient was started on TPN on 07/18/2018.  Remains n.p.o. until bowel function returns.  Subjective: No major events overnight of this morning.  No complaint this morning.  Pain fairly controlled.  Denies dyspnea or chest pain.  Leukocytosis downtrending.  Hemoglobin is stable.  Small purulent fluid in 1 of the drains.  Working on incentive spirometry.  Remains on TPN.  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 Derrell Lolling, 4/16 Expected postop ileus-has bowel sounds but no output in ostomy bag. -Abdomen is still open -Bowel sounds present but no output in ostomy bag. -IR and general surgery managing. -VSS on board as well-plan angiogram for SMA stenosis before discharge -Pain control per surgery-PRN morphine, IV Tylenol and Robaxin. -Hemoglobin 8.5.  Leukocytosis improving -TPN feeding started 4/18.  N.p.o. except for sips with meds. -Pharmacy to manage electrolyte abnormalities with TPN. -Continue meropenem 4/15--per ID.  Afebrile.  Leukocytosis improving. -Appreciate PT. Encourage incentive spirometry. -PRN Zofran  Leukocytosis: combination of stress and the above.  Improving. 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  Likely from occluded celiac axis -Continue PPI -Vascular surgery as above  Significant weight loss/moderate protein calorie malnutrition: Likely due to poor p.o. intake from the above. -Currently awaiting bowel function to feed. -TPN as above  History of COPD: Stable -Continue DuoNeb  Peripheral vascular disease with dry gangrene: Stable. -Continue home statin -Outpatient vascular surgery follow-up  Essential hypertension: Fairly controlled. -Continue home meds  Chronic atrial fibrillation: Not in RVR.  [place] vas score 6.  On Eliquis and metoprolol at home. -Heparin and metoprolol  Chronic pain: -Pain medications as above  Tobacco use disorder: -Cessation counseling -Nicotine patch  Scheduled Meds: . atorvastatin  40 mg Oral Daily  . Chlorhexidine Gluconate Cloth  6 each Topical Q0600  . insulin aspart  0-9 Units Subcutaneous Q4H  . lidocaine  1 patch Transdermal Q24H  . metoprolol tartrate  75 mg Oral BID  . nicotine  21 mg Transdermal Daily  . pantoprazole (PROTONIX) IV  40 mg Intravenous Q24H  . pregabalin  200 mg Oral TID  . sodium chloride flush  10-40 mL Intracatheter Q12H    Continuous Infusions: . sodium chloride Stopped (07/16/18 0842)  . heparin 1,500 Units/hr (07/21/18 0028)  . meropenem (MERREM) IV 1 g (07/21/18 0535)  . methocarbamol (ROBAXIN) IV 500 mg (07/21/18 0535)  . TPN ADULT (ION) 83 mL/hr at 07/20/18 1742  . TPN ADULT (ION)     PRN Meds:.sodium chloride, alum & mag hydroxide-simeth, hydrALAZINE, ipratropium-albuterol, labetalol, morphine injection, oxyCODONE, promethazine, sodium chloride flush  DVT prophylaxis: On heparin drip for atrial fibrillation Code Status: Full code Family Communication: None at bedside.  Disposition Plan: Remains inpatient.  Still with open abdomen.  She is on IV Remeron.  Final disposition SNF versus home with [facility]  Consultants:   Vascular surgery  Gastroenterology  Infectious disease  Interventional radiology  General surgery  Procedures:   EGD on 4/14  Flex sigmoidoscopy on 4/14  IR drain placement on 4/16 which showed a large left colon defect, 4/16  Ex Lap, LOA, Left sigmoid colectomy, descending colostomy (Hartmann's), Dr. Derrell Lolling, 4/16  Antimicrobials:  Vancomycin on 4/7  Cefepime 4/7-4/12  Meropenem 4/15--  Objective: Vitals:   07/20/18 1214 07/20/18 2008 07/21/18 0452 07/21/18 0841  BP: 136/64 139/64 (!) 150/64 (!) 158/68  Pulse: 71 69 69 78  Resp: (!) 21 19  (!) 23  Temp: 98.1 F (36.7 C) 98.4 F (36.9 C) 98.3 F (36.8 C) 98.3 F (36.8 C)  TempSrc: Oral Oral Oral Oral  SpO2: 95%  96% 94%  Weight:  65.5 kg   Height:        Intake/Output Summary (Last 24 hours) at 07/21/2018 1059 Last data filed at 07/21/2018 0850 Gross per 24 hour  Intake 3168.24 ml  Output 2495 ml  Net 673.24 ml   Filed Weights   07/19/18 0500 07/20/18 0410 07/21/18 0452  Weight: 68.2 kg 70.6 kg 65.5 kg    Examination: GENERAL: Frail looking elderly female.  No acute distress. HEENT: MMM.  Vision and Hearing grossly intact.  NECK: Supple.  No JVD.  LUNGS:  No IWOB. Good air movement. CTAB.   HEART:  RRR. Heart sounds normal.  ABD: Bowel sounds present.  Soft.  Surgical wound dressing clean and dry.  Colostomy with small serosanguineous fluid.  JP drains, one with small purulent drainage. EXT:   no edema bilaterally.  Moves both extremities. SKIN: no apparent skin lesion.  NEURO: Awake, alert and oriented to self, place and year.  No gross neuro deficit. PSYCH: Calm. Normal affect.  Data Reviewed: I have independently reviewed following labs and imaging studies  CBC: Recent Labs  Lab 07/15/18 0442  07/17/18 0259 07/18/18 0301 07/19/18 0432 07/20/18 0536 07/21/18 0541  WBC 25.0*  24.6*   < > 32.1* 29.2* 26.1* 21.4* 19.6*  NEUTROABS 21.9*  --   --   --  22.7* 17.2*  --   HGB 10.9*  10.8*   < > 10.5* 8.7* 7.5* 8.5* 8.3*  HCT 36.8  36.1   < > 34.7* 29.3* 25.7* 28.8* 27.4*  MCV 84.0  82.6   < > 85.0 84.0 86.5 83.2 83.5  PLT 448*  454*   < > 358 348 283 295 267   < > = values in this interval not displayed.   Basic Metabolic Panel: Recent Labs  Lab 07/17/18 0259 07/18/18 0301 07/18/18 1036 07/19/18 0432 07/20/18 0536 07/21/18 0541  NA 140 142  --  140 135 134*  K 5.7* 4.0  --  3.2* 4.2 4.5  CL 107 109  --  109 103 103  CO2 22 24  --  GLUCOSE 135* 129*  --  138* 126* 104*  BUN 13 8  --  7* 11 13  CREATININE 0.85 0.53  --  0.40* 0.43* 0.34*  CALCIUM 8.3* 8.0*  --  7.7* 8.0* 8.1*  MG  --   --  1.6* 1.7 1.5* 1.9  PHOS  --   --  1.9* 1.9* 2.2* 2.9   GFR: Estimated Creatinine Clearance: 62.7 mL/min (A) (by C-G formula based on SCr of 0.34 mg/dL (L)). Liver Function Tests: Recent Labs  Lab 07/15/18 0739 07/19/18 0432 07/20/18 0536  AST 28 71* 46*  ALT 13 36 32  ALKPHOS 121 105 143*  BILITOT 0.7 0.5 0.6  PROT 6.0* 4.7* 5.2*  ALBUMIN 1.8* 1.3* 1.3*   No results for input(s): LIPASE, AMYLASE in the last 168 hours. No results for input(s): AMMONIA in the last 168 hours. Coagulation Profile: No results for input(s): INR, PROTIME in the last  168 hours. Cardiac Enzymes: No results for input(s): CKTOTAL, CKMB, CKMBINDEX, TROPONINI in the last 168 hours. BNP (last 3 results) No results for input(s): PROBNP in the last 8760 hours. HbA1C: No results for input(s): HGBA1C in the last 72 hours. CBG: Recent Labs  Lab 07/20/18 1708 07/20/18 2009 07/21/18 0012 07/21/18 0452 07/21/18 0803  GLUCAP 127* 124* 136* 123* 119*   Lipid Profile: Recent Labs    07/19/18 0432 07/20/18 0530  TRIG 102  65   Thyroid Function Tests: No results for input(s): TSH, T4TOTAL, FREET4, T3FREE, THYROIDAB in the last 72 hours. Anemia Panel: No results for input(s): VITAMINB12, FOLATE, FERRITIN, TIBC, IRON, RETICCTPCT in the last 72 hours. Urine analysis:    Component Value Date/Time   COLORURINE AMBER (A) 07/07/2018 1826   APPEARANCEUR HAZY (A) 07/07/2018 1826   LABSPEC 1.014 07/07/2018 1826   PHURINE 5.0 07/07/2018 1826   GLUCOSEU 50 (A) 07/07/2018 1826   HGBUR NEGATIVE 07/07/2018 1826   BILIRUBINUR NEGATIVE 07/07/2018 1826   KETONESUR NEGATIVE 07/07/2018 1826   PROTEINUR NEGATIVE 07/07/2018 1826   NITRITE NEGATIVE 07/07/2018 1826   LEUKOCYTESUR NEGATIVE 07/07/2018 1826   Sepsis Labs: Invalid input(s): PROCALCITONIN, LACTICIDVEN  No results found for this or any previous visit (from the past 240 hour(s)).    Radiology Studies: No results found.   [facility] Center[facility] Pager [phone]  If 7PM-7AM, please contact night-coverage [email or web address] Password [email or web address] 07/21/2018, 10:59 AM

## 2018-07-21 NOTE — Progress Notes (Signed)
5 Days Post-Op    CC: perforated colon  Subjective: Still nothing coming out of the ostomy.  Ostomy is a little edematous, but pink.  Midline incision is slightly better.  She still has some fat necrosis along the sidewalls and at the base of the incision.  The fascia is intact.  She has 2 drains.  Lower one is serosanguineous.  The other has this brownish cloudy fluid in it although there is not much volume.  She is moving between 250 and 350 on her incentive spirometer.  Objective: Vital signs in last 24 hours: Temp:  [98.1 F (36.7 C)-98.4 F (36.9 C)] 98.3 F (36.8 C) (04/21 0841) Pulse Rate:  [69-78] 78 (04/21 0841) Resp:  [19-23] 23 (04/21 0841) BP: (136-158)/(64-68) 158/68 (04/21 0841) SpO2:  [94 %-96 %] 94 % (04/21 0841) Weight:  [65.5 kg] 65.5 kg (04/21 0452) Last BM Date: 07/14/18 3168 IV 1650 urine Drains  1 left medial  - 15 Drain 2 LLQ - 30 Afebrile, VSS BMP OK  - K+ 4.5/Mag 1.9 WBC 19.6 Intake/Output from previous day: 04/20 0701 - 04/21 0700 In: 3168.2 [I.V.:2014; IV Piggyback:1154.2] Out: 1700 [Urine:1650; Drains:45; Stool:5] Intake/Output this shift: Total I/O In: -  Out: 800 [Urine:800]  General appearance: alert, cooperative and no distress Resp: clear to auscultation bilaterally and Moving between 250 and 350 onset of spirometry. GI: Soft open wound, as noted yesterday slightly better with twice daily dressing changes.  Still some fat necrosis at the base along the sidewalls.  No gas or stool in the ostomy bag just some serosanguineous fluid.  The ostomy is open digital exam to about 1 cm,.  Lab Results:  Recent Labs    07/20/18 0536 07/21/18 0541  WBC 21.4* 19.6*  HGB 8.5* 8.3*  HCT 28.8* 27.4*  PLT 295 267    BMET Recent Labs    07/20/18 0536 07/21/18 0541  NA 135 134*  K 4.2 4.5  CL 103 103  CO2 23 23  GLUCOSE 126* 104*  BUN 11 13  CREATININE 0.43* 0.34*  CALCIUM 8.0* 8.1*   PT/INR No results for input(s): LABPROT, INR in the  last 72 hours.  Recent Labs  Lab 07/15/18 0739 07/19/18 0432 07/20/18 0536  AST 28 71* 46*  ALT 13 36 32  ALKPHOS 121 105 143*  BILITOT 0.7 0.5 0.6  PROT 6.0* 4.7* 5.2*  ALBUMIN 1.8* 1.3* 1.3*     Lipase     Component Value Date/Time   LIPASE 65 (H) 07/08/2018 1325     Medications: . atorvastatin  40 mg Oral Daily  . Chlorhexidine Gluconate Cloth  6 each Topical Q0600  . insulin aspart  0-9 Units Subcutaneous Q4H  . lidocaine  1 patch Transdermal Q24H  . metoprolol tartrate  75 mg Oral BID  . nicotine  21 mg Transdermal Daily  . pantoprazole (PROTONIX) IV  40 mg Intravenous Q24H  . pregabalin  200 mg Oral TID  . sodium chloride flush  10-40 mL Intracatheter Q12H   . sodium chloride Stopped (07/16/18 0842)  . heparin 1,500 Units/hr (07/21/18 0028)  . magnesium sulfate 1 - 4 g bolus IVPB    . meropenem (MERREM) IV 1 g (07/21/18 0535)  . methocarbamol (ROBAXIN) IV 500 mg (07/21/18 0535)  . TPN ADULT (ION) 83 mL/hr at 07/20/18 1742   Assessment/Plan A. 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 Derrell Lolling, 4/16 POD#5 - BID WTD dressing changes Expectedpost-op ileus. No bowel function throughout colostomy. NPO - Patient pulled NG tube overnight04/17. Re-insert if develops N/V or abdominal distension. - Mobilize as tolerated. IS/pulm toliet. PT evaluation yesterday SNF versus home health with PT pending progress.  FEN- NPO except ice chips, sips with meds, IVF, TPN (4/19) VTE -SCDs, heparin drip ID -Meropenem 4/15 >>  per ID, day 7;  WBC26.1>> 21.4, >>19.6(4/21), afebrile Foley - Removed POD 1 Follow up -Dr. Derrell Lolling  Plan:  Continue to mobilize, continue dressing changes, continue I-S,continue abx, await bowel function return.       LOS: 14 days    [name] 07/21/2018 [phone]

## 2018-07-21 NOTE — Progress Notes (Signed)
Physical Therapy Treatment Patient Details Name: Danielle FilbertLinda S Grant MRN: [id number] DOB: 1946/03/21 Today's Date: 07/21/2018    History of Present Illness  [name] is a 73 y.o. female with medical history significant of chronic back pain, GERD, hyperlipidemia, hypertension, left knee osteoarthritis, palpitations, PVD h/o Aortofem bypass, s/p RLE thromboembolectomy and R common emoral artery patch angioplasty 05/31/18, history of tobacco use who presents to ER due to fall at home.  patient now s/p EXPLORATORY LAPAROTOMY, lysis of adhesions X 45 minutes LEFT SIGMOID COLECTOMY, SPLENIC FLEXOR MOBILIZATION,DESCENDING COLOSTOMY on 4/16.    PT Comments    Pt admitted with above diagnosis. Pt currently with functional limitations due to balance and endurance deficits. Pt was able to ambulate with RW with min guard to min assist to door and back x 2 with a sitting rest break.  Pt progressing and does feel better today.  Needs max encouragment to participate. Pt will benefit from skilled PT to increase their independence and safety with mobility to allow discharge to the venue listed below.     Follow Up Recommendations  SNF(HHPT if progresses well )     Equipment Recommendations  Other (comment)(TBD )    Recommendations for Other Services       Precautions / Restrictions Precautions Precautions: Fall Restrictions Weight Bearing Restrictions: No    Mobility  Bed Mobility Overal bed mobility: Needs Assistance Bed Mobility: Supine to Sit     Supine to sit: Min guard;Min assist     General bed mobility comments: Pt was able to come to EOB with cues with a little assist for elevation of trunk.   Transfers Overall transfer level: Needs assistance Equipment used: Rolling walker (2 wheeled) Transfers: Sit to/from Stand Sit to Stand: Min guard;Min assist         General transfer comment: no assist needed for standing, but steadying assist once up.    Ambulation/Gait Ambulation/Gait assistance: Min assist;Min guard Gait Distance (Feet): 30 Feet(15 feet x 2) Assistive device: Rolling walker (2 wheeled) Gait Pattern/deviations: Step-through pattern;Decreased stride length;Drifts right/left;Trunk flexed Gait velocity: slow Gait velocity interpretation: <1.31 ft/sec, indicative of household ambulator General Gait Details: short guarded steps but did progress ambulation today to door x 2.  Needed 1 sitting rest break.    Stairs             Wheelchair Mobility    Modified Rankin (Stroke Patients Only)       Balance Overall balance assessment: Needs assistance Sitting-balance support: Feet supported;Bilateral upper extremity supported Sitting balance-Leahy Scale: Poor Sitting balance - Comments: needed UE support for balance   Standing balance support: During functional activity;Bilateral upper extremity supported Standing balance-Leahy Scale: Poor Standing balance comment: mildly unsteady, needed assist to be steady in standing.                            Cognition Arousal/Alertness: Awake/alert Behavior During Therapy: WFL for tasks assessed/performed Overall Cognitive Status: Impaired/Different from baseline                                        Exercises General Exercises - Lower Extremity Ankle Circles/Pumps: AROM;10 reps;Supine;Both Long Arc Quad: AROM;Both;Seated;10 reps    General Comments General comments (skin integrity, edema, etc.): VSS      Pertinent Vitals/Pain Pain Assessment: Faces Faces Pain Scale: Hurts whole lot Pain Location:  stomach Pain Descriptors / Indicators: Burning;Discomfort Pain Intervention(s): Limited activity within patient's tolerance;Monitored during session;Premedicated before session;Repositioned    Home Living                      Prior Function            PT Goals (current goals can now be found in the care plan section) Acute  Rehab PT Goals Patient Stated Goal: return home with family to assist Progress towards PT goals: Progressing toward goals    Frequency    Min 2X/week      PT Plan Current plan remains appropriate;Frequency needs to be updated    Co-evaluation              AM-PAC PT "6 Clicks" Mobility   Outcome Measure  Help needed turning from your back to your side while in a flat bed without using bedrails?: A Little Help needed moving from lying on your back to sitting on the side of a flat bed without using bedrails?: A Little Help needed moving to and from a bed to a chair (including a wheelchair)?: A Little Help needed standing up from a chair using your arms (e.g., wheelchair or bedside chair)?: A Little Help needed to walk in hospital room?: A Little Help needed climbing 3-5 steps with a railing? : A Lot 6 Click Score: 17    End of Session Equipment Utilized During Treatment: Gait belt Activity Tolerance: Patient limited by pain;Patient limited by fatigue Patient left: with call bell/phone within reach;in chair;with chair alarm set Nurse Communication: Mobility status PT Visit Diagnosis: Unsteadiness on feet (R26.81);Other abnormalities of gait and mobility (R26.89);Difficulty in walking, not elsewhere classified (R26.2)     Time: 0937-1000 PT Time Calculation (min) (ACUTE ONLY): 23 min  Charges:  $Gait Training: 8-22 mins $Therapeutic Exercise: 8-22 mins                     Marchell Froman,PT Acute Rehabilitation Services Pager:  [phone]  Office:  [phone]     Berline Lopes 07/21/2018, 10:33 AM

## 2018-07-22 LAB — CBC
HCT: 27.1 % — ABNORMAL LOW (ref 36.0–46.0)
Hemoglobin: 8.2 g/dL — ABNORMAL LOW (ref 12.0–15.0)
MCH: 25 pg — ABNORMAL LOW (ref 26.0–34.0)
MCHC: 30.3 g/dL (ref 30.0–36.0)
MCV: 82.6 fL (ref 80.0–100.0)
Platelets: 296 10*3/uL (ref 150–400)
RBC: 3.28 MIL/uL — ABNORMAL LOW (ref 3.87–5.11)
RDW: 18.2 % — ABNORMAL HIGH (ref 11.5–15.5)
WBC: 18 10*3/uL — ABNORMAL HIGH (ref 4.0–10.5)
nRBC: 1 % — ABNORMAL HIGH (ref 0.0–0.2)

## 2018-07-22 LAB — BASIC METABOLIC PANEL
Anion gap: 7 (ref 5–15)
BUN: 13 mg/dL (ref 8–23)
CO2: 23 mmol/L (ref 22–32)
Calcium: 8 mg/dL — ABNORMAL LOW (ref 8.9–10.3)
Chloride: 103 mmol/L (ref 98–111)
Creatinine, Ser: 0.35 mg/dL — ABNORMAL LOW (ref 0.44–1.00)
GFR calc Af Amer: >60 mL/min (ref >60)
GFR calc non Af Amer: >60 mL/min (ref >60)
Glucose, Bld: 106 mg/dL — ABNORMAL HIGH (ref 70–99)
Potassium: 4.3 mmol/L (ref 3.5–5.1)
Sodium: 133 mmol/L — ABNORMAL LOW (ref 135–145)

## 2018-07-22 LAB — GLUCOSE, CAPILLARY
Glucose-Capillary: 112 mg/dL — ABNORMAL HIGH (ref 70–99)
Glucose-Capillary: 123 mg/dL — ABNORMAL HIGH (ref 70–99)
Glucose-Capillary: 127 mg/dL — ABNORMAL HIGH (ref 70–99)
Glucose-Capillary: 139 mg/dL — ABNORMAL HIGH (ref 70–99)
Glucose-Capillary: 99 mg/dL (ref 70–99)

## 2018-07-22 LAB — HEPARIN LEVEL (UNFRACTIONATED)
Heparin Unfractionated: 0.25 IU/mL — ABNORMAL LOW (ref 0.30–0.70)
Heparin Unfractionated: 0.41 IU/mL (ref 0.30–0.70)

## 2018-07-22 MED ORDER — TRAVASOL 10 % IV SOLN
INTRAVENOUS | Status: AC
  Administered 2018-07-22: 18:00:00 via INTRAVENOUS
  Filled 2018-07-22: qty 1195.2

## 2018-07-22 MED ORDER — BOOST / RESOURCE BREEZE PO LIQD CUSTOM
1.0000 | Freq: Two times a day (BID) | ORAL | Status: DC
  Administered 2018-07-22 (×2): 1 via ORAL

## 2018-07-22 MED ORDER — PHENOL 1.4 % MT LIQD
1.0000 | OROMUCOSAL | Status: DC | PRN
  Administered 2018-07-22: 1 via OROMUCOSAL
  Filled 2018-07-22: qty 177

## 2018-07-22 MED ORDER — JUVEN PO PACK
1.0000 | PACK | Freq: Two times a day (BID) | ORAL | Status: DC
  Administered 2018-07-22 – 2018-07-23 (×2): 1 via ORAL
  Filled 2018-07-22 (×6): qty 1

## 2018-07-22 NOTE — Progress Notes (Signed)
PROGRESS NOTE    [name]  QQV:[id number] DOB: 1946/03/28 DOA: 07/07/2018 PCP: Junie Spencer, [name]    Brief Narrative:  73 year old female with history of chronic N/V/abdominal pain, A. fib, PVD status post right femoral thrombectomy on 05/31/2018, hypertension and chronic back pain presenting with intractable nausea, vomiting, abdominal pain, new onset of diarrhea and about 30 pounds weight loss in the last several months.   She was admitted to [facility] 5 days ago with somnolence and a diagnosis of sepsis. She has had a prior aortobifem by Dr Hart Rochester in 2002 and had thrombectomy of the right limb of this by Dr Randie Heinz 3/20. 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 Her left internal iliac artery was patent at that point.   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 Also there is a 3.9 x 1.8 cm and 2.6 x 1.1 cm fluid collection in right pelvis/groin thought to be seroma from prior surgery.   Per H&P, interventional radiology at [facility] was consulted and consideration was being given for stenting her SMA which has about a (30-50% stenosis) to improve mesenteric blood flow.   Patient was transferred to [facility] for further management.  Patient was also treated with cefepime for presumed pneumonia.  Gastroenterology and vascular surgery consulted.  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  Biopsies taken and pending.  She also had flexible sigmoidoscopy that showed only a 7 mm sessile polyp in the rectum but poor prep.  On  4/15, and continued to have significant leukocytosis, mild temperature and abdominal tenderness.  This raised concern about intra-abdominal occult infection especially with the fluid collections noted on CT abdomen.  Infectious disease consulted and started patient on meropenem.  IR consulted and recommended getting repeat CT abdomen and pelvis which showed frank perforation of the descending colon appears to be contained within a portion of the omentum, or occluded celiac axis, slightly decreased pancreatic tail lesion but no previously noted fluid collections.  General surgery and IR consulted.  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 Derrell Lolling on 4/16.  Abdomen left open out of concern for infectious process and ileus.  Patient was started on TPN on 07/18/2018.  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 Derrell Lolling, 4/16 Expected postop ileus-has bowel sounds but no output in  ostomy bag. -Abdomen remains open -Bowel sounds present but no output in ostomy bag. -IR and general surgery following -VSS on board as well-plan angiogram for SMA stenosis before discharge -Pain control per surgery-PRN morphine, IV Tylenol and Robaxin. -Hemoglobin 8.5.   Leukocytosis improving -TPN feeding started 4/18.  -Pharmacy to manage electrolyte abnormalities with TPN. -Continue meropenem 4/15--per ID.  Afebrile.  Leukocytosis improving. -Appreciate PT. Encourage incentive spirometry. -Continue zofran as needed. -Per general surgery, plan to start clears  Leukocytosis: combination of stress and the above.  Improving. 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  Likely from occluded celiac axis -Continue PPI -Vascular surgery following per above  Significant weight loss/moderate protein calorie malnutrition: Likely due to poor p.o. intake from the above. -Currently awaiting bowel function to feed. -TPN as tolerated  History of COPD: Stable -Continue DuoNeb as needed   Peripheral vascular disease with dry gangrene: Stable. -Continue home statin -Outpatient vascular surgery follow-up  Essential hypertension: Fairly controlled. -Continue home meds  Chronic atrial fibrillation: Not in RVR.  [place] vas score 6.  On Eliquis and metoprolol at home. -Continued on Heparin and metoprolol -Remains rate controlled  Chronic pain: -Pain medications as above  Tobacco use disorder: -Cessation counseling -Continue Nicotine patch. Stable  DVT prophylaxis: Heparin gtt Code Status: Full Family Communication: Pt in room, family not at bedside Disposition Plan: Uncertain at this time  Consultants:   Vascular surgery  Gastroenterology  Infectious disease  Interventional radiology  General surgery  Procedures:   EGD on 4/14  Flex sigmoidoscopy on 4/14  IR drain placement on 4/16 which showed a large left colon defect, 4/16  Ex Lap, LOA, Left sigmoid colectomy, descending colostomy (Hartmann's), Dr. Derrell Lolling, 4/16  Antimicrobials: Anti-infectives (From admission, onward)   Start     Dose/Rate Route Frequency Ordered Stop   07/15/18 1700  meropenem  (MERREM) 1 g in sodium chloride 0.9 % 100 mL IVPB     1 g 200 mL/hr over 30 Minutes Intravenous Every 8 hours 07/15/18 1620     07/08/18 2300  vancomycin (VANCOCIN) 1,500 mg in sodium chloride 0.9 % 500 mL IVPB  Status:  Discontinued     1,500 mg 250 mL/hr over 120 Minutes Intravenous  Once 07/07/18 2128 07/07/18 2129   07/08/18 2300  vancomycin (VANCOCIN) 1,500 mg in sodium chloride 0.9 % 500 mL IVPB  Status:  Discontinued     1,500 mg 250 mL/hr over 120 Minutes Intravenous Every 24 hours 07/07/18 2129 07/08/18 0726   07/08/18 0600  ceFEPIme (MAXIPIME) 2 g in sodium chloride 0.9 % 100 mL IVPB  Status:  Discontinued     2 g 200 mL/hr over 30 Minutes Intravenous Every 8 hours 07/07/18 2124 07/13/18 1333   07/07/18 2130  vancomycin (VANCOCIN) 1,500 mg in sodium chloride 0.9 % 500 mL IVPB     1,500 mg 250 mL/hr over 120 Minutes Intravenous  Once 07/07/18 2123 07/08/18 0028   07/07/18 2115  vancomycin (VANCOCIN) IVPB 1000 mg/200 mL premix  Status:  Discontinued     1,000 mg 200 mL/hr over 60 Minutes Intravenous  Once 07/07/18 2110 07/07/18 2128   07/07/18 2115  ceFEPIme (MAXIPIME) 2 g in sodium chloride 0.9 % 100 mL IVPB     2 g 200 mL/hr over 30 Minutes Intravenous  Once 07/07/18 2110 07/07/18 2203       Subjective: Without complaints this AM  Objective: Vitals:   07/21/18 0452 07/21/18  [id number] 07/21/18 2102 07/22/18 0407  BP: (!) 150/64 (!) 158/68 109/67 (!) 140/58  Pulse: 69 78 86 70  Resp:  (!) 23 19   Temp: 98.3 F (36.8 C) 98.3 F (36.8 C) 98.9 F (37.2 C) 98.2 F (36.8 C)  TempSrc: Oral Oral Oral Oral  SpO2: 96% 94% 95% 93%  Weight: 65.5 kg   64.5 kg  Height:        Intake/Output Summary (Last 24 hours) at 07/22/2018 1133 Last data filed at 07/22/2018 0849 Gross per 24 hour  Intake 2905.44 ml  Output 2232 ml  Net 673.44 ml   Filed Weights   07/20/18 0410 07/21/18 0452 07/22/18 0407  Weight: 70.6 kg 65.5 kg 64.5 kg    Examination:  General exam: Appears calm  and comfortable  Respiratory system: Clear to auscultation. Respiratory effort normal. Cardiovascular system: S1 & S2 heard, RRR Gastrointestinal system: Soft, nondistended, mildly tender Central nervous system: Alert and oriented. No focal neurological deficits. Extremities: Symmetric 5 x 5 power. Skin: No rashes, lesions Psychiatry: Judgement and insight appear normal. Mood & affect appropriate.   Data Reviewed: I have personally reviewed following labs and imaging studies  CBC: Recent Labs  Lab 07/18/18 0301 07/19/18 0432 07/20/18 0536 07/21/18 0541 07/22/18 0643  WBC 29.2* 26.1* 21.4* 19.6* 18.0*  NEUTROABS  --  22.7* 17.2*  --   --   HGB 8.7* 7.5* 8.5* 8.3* 8.2*  HCT 29.3* 25.7* 28.8* 27.4* 27.1*  MCV 84.0 86.5 83.2 83.5 82.6  PLT 348 283 295 267 296   Basic Metabolic Panel: Recent Labs  Lab 07/18/18 0301 07/18/18 1036 07/19/18 0432 07/20/18 0536 07/21/18 0541 07/22/18 0643  NA 142  --  140 135 134* 133*  K 4.0  --  3.2* 4.2 4.5 4.3  CL 109  --  109 103 103 103  CO2 24  --  28 23 23 23   GLUCOSE 129*  --  138* 126* 104* 106*  BUN 8  --  7* 11 13 13   CREATININE 0.53  --  0.40* 0.43* 0.34* 0.35*  CALCIUM 8.0*  --  7.7* 8.0* 8.1* 8.0*  MG  --  1.6* 1.7 1.5* 1.9  --   PHOS  --  1.9* 1.9* 2.2* 2.9  --    GFR: Estimated Creatinine Clearance: 62.7 mL/min (A) (by C-G formula based on SCr of 0.35 mg/dL (L)). Liver Function Tests: Recent Labs  Lab 07/19/18 0432 07/20/18 0536  AST 71* 46*  ALT 36 32  ALKPHOS 105 143*  BILITOT 0.5 0.6  PROT 4.7* 5.2*  ALBUMIN 1.3* 1.3*   No results for input(s): LIPASE, AMYLASE in the last 168 hours. No results for input(s): AMMONIA in the last 168 hours. Coagulation Profile: No results for input(s): INR, PROTIME in the last 168 hours. Cardiac Enzymes: No results for input(s): CKTOTAL, CKMB, CKMBINDEX, TROPONINI in the last 168 hours. BNP (last 3 results) No results for input(s): PROBNP in the last 8760 hours. HbA1C: No  results for input(s): HGBA1C in the last 72 hours. CBG: Recent Labs  Lab 07/21/18 1157 07/21/18 1632 07/21/18 2058 07/22/18 0019 07/22/18 0400  GLUCAP 117* 130* 118* 127* 123*   Lipid Profile: Recent Labs    07/20/18 0530  TRIG 65   Thyroid Function Tests: No results for input(s): TSH, T4TOTAL, FREET4, T3FREE, THYROIDAB in the last 72 hours. Anemia Panel: No results for input(s): VITAMINB12, FOLATE, FERRITIN, TIBC, IRON, RETICCTPCT in the last 72 hours. Sepsis Labs: No results for input(s): PROCALCITON,  LATICACIDVEN in the last 168 hours.  No results found for this or any previous visit (from the past 240 hour(s)).   Radiology Studies: No results found.  Scheduled Meds: . atorvastatin  40 mg Oral Daily  . Chlorhexidine Gluconate Cloth  6 each Topical Q0600  . feeding supplement  1 Container Oral BID BM  . insulin aspart  0-9 Units Subcutaneous Q4H  . lidocaine  1 patch Transdermal Q24H  . metoprolol tartrate  75 mg Oral BID  . nicotine  21 mg Transdermal Daily  . nutrition supplement (JUVEN)  1 packet Oral BID BM  . pantoprazole (PROTONIX) IV  40 mg Intravenous Q24H  . pregabalin  200 mg Oral TID  . sodium chloride flush  10-40 mL Intracatheter Q12H   Continuous Infusions: . sodium chloride Stopped (07/16/18 0842)  . heparin 1,550 Units/hr (07/22/18 0900)  . meropenem (MERREM) IV 1 g (07/22/18 0912)  . methocarbamol (ROBAXIN) IV 500 mg (07/22/18 0645)  . TPN ADULT (ION) 83 mL/hr at 07/21/18 1730  . TPN ADULT (ION)       LOS: 15 days   Rickey Barbara, MD [facility] Pager On Amion  If 7PM-7AM, please contact night-coverage 07/22/2018, 11:33 AM

## 2018-07-22 NOTE — Progress Notes (Signed)
   Ileus is resolving.  Plan for angiogram prior to discharge.  Shekela Goodridge C. Randie Heinz, MD [facility] Office: [phone] Pager: [phone]

## 2018-07-22 NOTE — Progress Notes (Addendum)
6 Days Post-Op    CC: perforated colon  Subjective: She has some gas in her ostomy bag this AM. The midline base looks worse, there are more sutures seen and they are a little loose mid upper 1/3 of the wound.  She is doing better walking in room and getting up to bedside commode.  She seems really tired and worn out by all of this.  Nursing staff reports it's very difficult to get her to take even ice chips and water.   Drain is a little clearer today   Objective: Vital signs in last 24 hours: Temp:  [98.2 F (36.8 C)-98.9 F (37.2 C)] 98.2 F (36.8 C) (04/22 0407) Pulse Rate:  [70-86] 70 (04/22 0407) Resp:  [19] 19 (04/21 2102) BP: (109-140)/(58-67) 140/58 (04/22 0407) SpO2:  [93 %-95 %] 93 % (04/22 0407) Weight:  [64.5 kg] 64.5 kg (04/22 0407) Last BM Date: 07/14/18 NPO 2900 IV 3000 urine Drains:  Medial drain 0 LLQ drain 22 No stool recorded Afebrile, VSS WBC is pending other labs OK  Intake/Output from previous day: 04/21 0701 - 04/22 0700 In: 2905.4 [I.V.:2462.4; IV Piggyback:433.1] Out: 3022 [Urine:3000; Drains:22] Intake/Output this shift: Total I/O In: 0  Out: 10 [Stool:10]  General appearance: alert, cooperative, no distress and very tired and worn down Resp: clear to auscultation bilaterally and anterior exam GI: soft, sore, Midline wound is worrisome. More necrosis at the base, but the sides are better.  Some gas in the ostomy bag this AM.    Lab Results:  Recent Labs    07/21/18 0541 07/22/18 0643  WBC 19.6* PENDING  HGB 8.3* 8.2*  HCT 27.4* 27.1*  PLT 267 296    BMET Recent Labs    07/21/18 0541 07/22/18 0643  NA 134* 133*  K 4.5 4.3  CL 103 103  CO2 23 23  GLUCOSE 104* 106*  BUN 13 13  CREATININE 0.34* 0.35*  CALCIUM 8.1* 8.0*   PT/INR No results for input(s): LABPROT, INR in the last 72 hours.  Recent Labs  Lab 07/19/18 0432 07/20/18 0536  AST 71* 46*  ALT 36 32  ALKPHOS 105 143*  BILITOT 0.5 0.6  PROT 4.7* 5.2*  ALBUMIN  1.3* 1.3*     Lipase     Component Value Date/Time   LIPASE 65 (H) 07/08/2018 1325     Medications: . atorvastatin  40 mg Oral Daily  . Chlorhexidine Gluconate Cloth  6 each Topical Q0600  . insulin aspart  0-9 Units Subcutaneous Q4H  . lidocaine  1 patch Transdermal Q24H  . metoprolol tartrate  75 mg Oral BID  . nicotine  21 mg Transdermal Daily  . pantoprazole (PROTONIX) IV  40 mg Intravenous Q24H  . pregabalin  200 mg Oral TID  . sodium chloride flush  10-40 mL Intracatheter Q12H   . sodium chloride Stopped (07/16/18 0842)  . heparin 1,550 Units/hr (07/22/18 0900)  . meropenem (MERREM) IV 1 g (07/22/18 0912)  . methocarbamol (ROBAXIN) IV 500 mg (07/22/18 0645)  . TPN ADULT (ION) 83 mL/hr at 07/21/18 1730   Anti-infectives (From admission, onward)   Start     Dose/Rate Route Frequency Ordered Stop   07/15/18 1700  meropenem (MERREM) 1 g in sodium chloride 0.9 % 100 mL IVPB     1 g 200 mL/hr over 30 Minutes Intravenous Every 8 hours 07/15/18 1620     07/08/18 2300  vancomycin (VANCOCIN) 1,500 mg in sodium chloride 0.9 % 500 mL IVPB  Status:  Discontinued     1,500 mg 250 mL/hr over 120 Minutes Intravenous  Once 07/07/18 2128 07/07/18 2129   07/08/18 2300  vancomycin (VANCOCIN) 1,500 mg in sodium chloride 0.9 % 500 mL IVPB  Status:  Discontinued     1,500 mg 250 mL/hr over 120 Minutes Intravenous Every 24 hours 07/07/18 2129 07/08/18 0726   07/08/18 0600  ceFEPIme (MAXIPIME) 2 g in sodium chloride 0.9 % 100 mL IVPB  Status:  Discontinued     2 g 200 mL/hr over 30 Minutes Intravenous Every 8 hours 07/07/18 2124 07/13/18 1333   07/07/18 2130  vancomycin (VANCOCIN) 1,500 mg in sodium chloride 0.9 % 500 mL IVPB     1,500 mg 250 mL/hr over 120 Minutes Intravenous  Once 07/07/18 2123 07/08/18 0028   07/07/18 2115  vancomycin (VANCOCIN) IVPB 1000 mg/200 mL premix  Status:  Discontinued     1,000 mg 200 mL/hr over 60 Minutes Intravenous  Once 07/07/18 2110 07/07/18 2128    07/07/18 2115  ceFEPIme (MAXIPIME) 2 g in sodium chloride 0.9 % 100 mL IVPB     2 g 200 mL/hr over 30 Minutes Intravenous  Once 07/07/18 2110 07/07/18 2203     Assessment/Plan A. 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 Derrell Lollingamirez, 4/16POD #6 - BID WTD dressing changes Expectedpost-op ileus. No bowel function throughout colostomy. NPO - Patient pulled NG tube overnight04/17. Re-insert if develops N/V or abdominal distension. - Mobilize as tolerated. IS/pulm toliet. PT evaluation yesterday SNF versus home health with PT pending progress.  FEN- clears from the floor/nutrition consult, IVF,TPN (4/19) VTE -SCDs, heparindrip ID -Meropenem 4/15 >> per ID,day 8;WBC26.1>> 21.4, >>19.6(4/21),afebrile Foley - Removed POD 1 Follow up -Dr. Derrell Lollingamirez  Plan:  Clears from the floor so nursing can see how she does, I talked with the nurse this AM about it.  Add a binder.  Continue to mobilize.         LOS: 15 days    Terrell Ostrand 07/22/2018 [phone]

## 2018-07-22 NOTE — Progress Notes (Signed)
PHARMACY - ADULT TOTAL PARENTERAL NUTRITION CONSULT NOTE   Pharmacy Consult for TPN Indication: Post-op ileus  Patient Measurements: Height: 5\' 7"  (170.2 cm) Weight: 142 lb 3.2 oz (64.5 kg) IBW/kg (Calculated) : 61.6 TPN AdjBW (KG): 62.3 Body mass index is 22.27 kg/m.  Assessment: 54-yo female with h/o chronic N/V/abdominal pain, A. fib, PVD status post right femoral thrombectomy on 05/31/2018, hypertension and chronic back pain presenting with intractable nausea, vomiting, abdominal pain, new onset of diarrhea and about 30 pounds weight loss in the last several months.   She was admitted to [facility] with a diagnosis of sepsis. 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 Also there is a 3.9 x 1.8 cm and 2.6 x 1.1 cm fluid collection in right pelvis/groin thought to be seroma from prior surgery. IR at [facility] was consulted for stenting her SMA which has about a (30-50% stenosis) to improve mesenteric blood flow.   Patient was transferred to [facility] for further management. S/p upper endoscopy 07/14/18 found Severe ulcerative gastritis most consistent ischemia, biopsied, results pending. CT showed perforation of descending colon. 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 Now with post-op ileus in the setting of 30 pound weight loss and minimal intake since admission.  GI: 2 x JP drains with 22 mls output, 10 mls from colostomy; Prealbumin <5. NGT pulled out 4/17- no plan to replace unless develops N/V or abdominal distension. Endo: No history of diabetes; CBGs <180 Insulin requirements in the  past 24 hours: 2 Lytes: Na slightly low at 133 (will increase in TPN); Corr Ca upper end at 10.2 (Alb 1.3); K 4.3; Mg/Phos wnl on 4/20. Renal: Scr 0.35; BUN 13; UOP 1.9 ml/kg/hr  Pulm: RA Cards: HTN improving; HR 70s on metoprolol and heparin drip Hepatobil:AST/ALT 46/32 (trending down); TBili 0.6; TGs 65 Neuro: No complaints. Pain fairly controlled. ID: WBC down to 19.6; afebrile; Meropenem 4/15>>  TPN Access: PICC requested for today (4/18) TPN start date: 4/18 Nutritional Goals (per RD recommendation on 4/18): Kcal:  2000-2200 kcals (32-36 kcal/kg bw) Protein:  112-125g Pro  (1.8-2g/kg bw) Fluid:  2-2.2 L (60ml/kcal)  Goal TPN rate is 83 ml/hr (This TPN will provide 120 g of protein, 269 g of dextrose, and 67 g of lipids which provides 2003  kCals per day, meeting 100% of patient needs)  Current Nutrition:  NPO except sips with meds TPN at 83 ml/hr  Plan:  Continue TPN at 83 mL/hr. This TPN will provide 120 g of protein, 269 g of dextrose, and 67 g of lipids which provides 2003  kCals per day, meeting 100% of patient needs Electrolytes in TPN: Standard except increase Na  Add MVI to TPN - trace elements are on back order will only place in TPN on Monday, Wednesday and Friday Continue sensitive SSI and adjust as needed Add Thiamine and Folate toTPN Monitor TPN labs Mon and Thurs.  F/u daily BMP as ordered by MD  Link Snuffer, PharmD, BCPS, BCCCP Clinical Pharmacist Please see AMION for all Pharmacists' Contact Phone Numbers 07/22/2018, 7:52 AM

## 2018-07-22 NOTE — Progress Notes (Signed)
Patient ID: [name], female   DOB: 1945-04-26, 73 y.o.   MRN: [id number]   IR Round note via phone Per new regulations   IR placed pelvic abs drain 4/16 Then to OR - s/p Ex Lap, LOA, Left sigmoid colectomy, descending colostomy (Hartmann's), Dr. Derrell Lolling, 4/16  Afeb Pt now has 2 drains in place Surgical drain and IR Drain  IR drain scant OP per RN Flushes easily  Followed by [facility] Plan per Dr Harlon Flor

## 2018-07-22 NOTE — Progress Notes (Signed)
ANTICOAGULATION CONSULT NOTE   Pharmacy Consult for heparin  Indication: atrial fibrillation  No Known Allergies  Patient Measurements: Height: 5\' 7"  (170.2 cm) Weight: 142 lb 3.2 oz (64.5 kg) IBW/kg (Calculated) : 61.6  HEPARIN DW (KG): 62.3   Vital Signs: Temp: 98.2 F (36.8 C) (04/22 0407) Temp Source: Oral (04/22 0407) BP: 140/58 (04/22 0407) Pulse Rate: 70 (04/22 0407)  Labs: Recent Labs    07/20/18 0536  07/21/18 0541 07/21/18 0844 07/22/18 0643  HGB 8.5*  --  8.3*  --  8.2*  HCT 28.8*  --  27.4*  --  27.1*  PLT 295  --  267  --  296  HEPARINUNFRC  --    < > 0.13* 0.34 0.25*  CREATININE 0.43*  --  0.34*  --  0.35*   < > = values in this interval not displayed.    Estimated Creatinine Clearance: 62.7 mL/min (A) (by C-G formula based on SCr of 0.35 mg/dL (L)).   Assessment: 73 y.o. female with h/o Afib, Eliquis on hold, for heparin. Last apixaban dose was 10am on 07/08/18. She is s/p upper endoscopy 07/14/18 and also s/p perc drain placement by IR , expl lap and left sigmoid colectomy with colostomy on 07/16/18.  Plans for possible mesenteric angio later his week.   Heparin level came back slightly subtherapeutic at 0.25, on 1500 units/hr. Heparin running into R wrist - unclear today where level drawn from. Hgb 8.3, plt 267. No s/sx of bleeding. No infusion issues.   Goal of Therapy:  Heparin level 0.3-0.7 units/ml Monitor platelets by anticoagulation protocol: Yes   Plan:  -Increase heparin drip to 1550 units/hr -Order heparin level in 6 hours to confirm in therapeutic range -Daily heparin level and CBC  Sherron Monday, PharmD, BCCCP Clinical Pharmacist  Pager: [phone] Phone: [phone] **Pharmacist phone directory can now be found on [email or web address] ([email or web address]).  Listed under [facility].

## 2018-07-22 NOTE — Progress Notes (Signed)
Physical Therapy Treatment Patient Details Name: Danielle FilbertLinda S Grant MRN: [id number] DOB: 1946-01-09 Today's Date: 07/22/2018    History of Present Illness  [name] is a 73 y.o. female with medical history significant of chronic back pain, GERD, hyperlipidemia, hypertension, left knee osteoarthritis, palpitations, PVD h/o Aortofem bypass, s/p RLE thromboembolectomy and R common emoral artery patch angioplasty 05/31/18, history of tobacco use who presents to ER due to fall at home.  patient now s/p EXPLORATORY LAPAROTOMY, lysis of adhesions X 45 minutes LEFT SIGMOID COLECTOMY, SPLENIC FLEXOR MOBILIZATION,DESCENDING COLOSTOMY on 4/16.    PT Comments    Pt admitted with above diagnosis. Pt currently with functional limitations due to the deficits listed below (see PT Problem List). Pt was able to ambulate with min assist and RW and incr distance somewhat.  Still needs motivation and incr time and assist to move.   Pt will benefit from skilled PT to increase their independence and safety with mobility to allow discharge to the venue listed below.     Follow Up Recommendations  SNF(HHPT if progresses well )     Equipment Recommendations  Other (comment)(TBD )    Recommendations for Other Services       Precautions / Restrictions Precautions Precautions: Fall Restrictions Weight Bearing Restrictions: No    Mobility  Bed Mobility Overal bed mobility: Needs Assistance Bed Mobility: Supine to Sit     Supine to sit: Min guard;Min assist     General bed mobility comments: Pt was able to come to EOB with cues with a little assist for elevation of trunk.   Transfers Overall transfer level: Needs assistance Equipment used: Rolling walker (2 wheeled) Transfers: Sit to/from Stand Sit to Stand: Min guard;Min assist Stand pivot transfers: Min assist       General transfer comment: no assist needed for standing, but steadying assist once up. Needed to urinate therefore got on 3N1  first and then walked.   Ambulation/Gait Ambulation/Gait assistance: Min assist;Min guard Gait Distance (Feet): 50 Feet Assistive device: Rolling walker (2 wheeled) Gait Pattern/deviations: Step-through pattern;Decreased stride length;Drifts right/left;Trunk flexed Gait velocity: slow Gait velocity interpretation: <1.31 ft/sec, indicative of household ambulator General Gait Details: short guarded steps but did progress ambulation today. Refuse3d to walk further than just out door with max encouragment.    Stairs             Wheelchair Mobility    Modified Rankin (Stroke Patients Only)       Balance Overall balance assessment: Needs assistance Sitting-balance support: Feet supported;Bilateral upper extremity supported Sitting balance-Leahy Scale: Poor Sitting balance - Comments: needed UE support for balance   Standing balance support: During functional activity;Bilateral upper extremity supported Standing balance-Leahy Scale: Poor Standing balance comment: mildly unsteady, needed assist to be steady in standing.                            Cognition Arousal/Alertness: Awake/alert Behavior During Therapy: WFL for tasks assessed/performed Overall Cognitive Status: Impaired/Different from baseline                                 General Comments: patient unable to follow cues well, reporting high pain      Exercises General Exercises - Lower Extremity Ankle Circles/Pumps: AROM;10 reps;Supine;Both Long Arc Quad: AROM;Both;Seated;10 reps Heel Slides: AROM;Both;10 reps;Supine    General Comments  Pertinent Vitals/Pain Pain Assessment: Faces Faces Pain Scale: Hurts whole lot Pain Location: stomach Pain Descriptors / Indicators: Burning;Discomfort Pain Intervention(s): Limited activity within patient's tolerance;Monitored during session;Premedicated before session;Repositioned    Home Living                      Prior  Function            PT Goals (current goals can now be found in the care plan section) Acute Rehab PT Goals Patient Stated Goal: return home with family to assist Progress towards PT goals: Progressing toward goals    Frequency    Min 2X/week      PT Plan Current plan remains appropriate;Frequency needs to be updated    Co-evaluation              AM-PAC PT "6 Clicks" Mobility   Outcome Measure  Help needed turning from your back to your side while in a flat bed without using bedrails?: A Little Help needed moving from lying on your back to sitting on the side of a flat bed without using bedrails?: A Little Help needed moving to and from a bed to a chair (including a wheelchair)?: A Little Help needed standing up from a chair using your arms (e.g., wheelchair or bedside chair)?: A Little Help needed to walk in hospital room?: A Little Help needed climbing 3-5 steps with a railing? : A Lot 6 Click Score: 17    End of Session Equipment Utilized During Treatment: Gait belt Activity Tolerance: Patient limited by pain;Patient limited by fatigue Patient left: with call bell/phone within reach;in chair;with chair alarm set Nurse Communication: Mobility status PT Visit Diagnosis: Unsteadiness on feet (R26.81);Other abnormalities of gait and mobility (R26.89);Difficulty in walking, not elsewhere classified (R26.2)     Time: 2297-9892 PT Time Calculation (min) (ACUTE ONLY): 21 min  Charges:  $Gait Training: 8-22 mins                     Rossanna Spitzley,PT Acute Rehabilitation Services Pager:  [phone]  Office:  [phone]     Berline Lopes 07/22/2018, 1:27 PM

## 2018-07-22 NOTE — Progress Notes (Signed)
Nutrition Follow-up  DOCUMENTATION CODES:   Severe malnutrition in context of acute illness/injury  INTERVENTION:    Add Boost Breeze po BID, each supplement provides 250 kcal and 9 grams of protein  Add Juven Fruit Punch BID, each serving provides 95kcal and 2.5g of protein (amino acids glutamine and arginine)  Continue TPN @ 83 ml/hr to provide 2003 kcal and 120 grams protein  NUTRITION DIAGNOSIS:   Severe Malnutrition related to acute illness(probable ischemic colitis) as evidenced by (21% wt loss in 4 months and an estimated energy intake < or equal to 50% for > or equal to 5 days.). Ongoing  GOAL:   Patient will meet greater than or equal to 90% of their needs  Met with TPN  MONITOR:   PO intake, Supplement acceptance, Labs, Skin, I & O's, Weight trends  REASON FOR ASSESSMENT:   Consult New TPN/TNA  ASSESSMENT:   73 y/o female PMHx GERD, HLD, HTN, PVD s/p aortofem bypass, s/p RLE thromboembolectomy and R common emoral artery patch angioplasty 05/31/18, and tobacco abuse. Presents this admission with sepsis secondary to PNA and probable ischemic colitis.    4/14- flex sig  4/15- repeat CT revealed self contained perforation. NPO 4/16- exlap, LOA, L sigmoid colectomy w/ colostomy and 2x JP drain placement, midline wound left open 4/17- NGT pulled out by pt 4/19 TPN start  RD working remotely.  Unable to reach pt by phone. Spoke with RN who reports pt did well with water this am. Per surgery pt okay to start clears from the floor. RD to order Boost Breeze and Juven to promote wound healing.   Pt tolerating TPN @ goal rate of 83 ml/hr. Recommend weaning as diet/toleration progresses.   Gas and bloody drainage reported in ostomy. Awaiting bowel return.   Weight noted to increase from 62.1 kg on 4/18 to 64.5 kg today. Will continue to monitor trends.   I/O: +5, 991 ml since 4/8 UOP: 3,000 x 24 hrs  Drains: 22 ml x 24 hrs   Medications: SS novolog Labs: Na 133  (L) CBG 117-127  Diet Order:   Diet Order            Diet clear liquid Room service appropriate? No; Fluid consistency: Thin  Diet effective now              EDUCATION NEEDS:   Education needs have been addressed  Skin:  Skin Assessment: Skin Integrity Issues: Skin Integrity Issues:: Incisions, Other (Comment) Incisions: OPEN Surgical incision to R + L abdomen (closed at fascia) and to R groin Other: Venous statis ulcer to L toe  Last BM:  4/22- bloody drainage from colostomy 10 ml  Height:   Ht Readings from Last 1 Encounters:  07/14/18 5' 7"  (1.702 m)    Weight:   Wt Readings from Last 1 Encounters:  07/22/18 64.5 kg    Ideal Body Weight:  61.4 kg  BMI:  Body mass index is 22.27 kg/m.  Estimated Nutritional Needs:   Kcal:  2000-2200 kcals (32-36 kcal/kg bw)  Protein:  112-125g Pro  (1.8-2g/kg bw)  Fluid:  2-2.2 L (24m/kcal)   CMariana SingleRD, LDN Clinical Nutrition Pager # [phone]

## 2018-07-22 NOTE — Progress Notes (Signed)
Abdominal dressing change done per MD order without difficulty.  Abdominal binder placed per MD order.  Patient tolerated well.  Will continue to monitor.

## 2018-07-22 NOTE — Progress Notes (Signed)
ANTICOAGULATION CONSULT NOTE   Pharmacy Consult for heparin  Indication: atrial fibrillation  No Known Allergies  Patient Measurements: Height: 5\' 7"  (170.2 cm) Weight: 142 lb 3.2 oz (64.5 kg) IBW/kg (Calculated) : 61.6  HEPARIN DW (KG): 62.3   Vital Signs: Temp: 98.2 F (36.8 C) (04/22 0407) Temp Source: Oral (04/22 0407) BP: 140/58 (04/22 0407) Pulse Rate: 70 (04/22 0407)  Labs: Recent Labs    07/20/18 0536  07/21/18 0541 07/21/18 0844 07/22/18 0643 07/22/18 1442  HGB 8.5*  --  8.3*  --  8.2*  --   HCT 28.8*  --  27.4*  --  27.1*  --   PLT 295  --  267  --  296  --   HEPARINUNFRC  --    < > 0.13* 0.34 0.25* 0.41  CREATININE 0.43*  --  0.34*  --  0.35*  --    < > = values in this interval not displayed.    Estimated Creatinine Clearance: 62.7 mL/min (A) (by C-G formula based on SCr of 0.35 mg/dL (L)).   Assessment: 73 y.o. female with h/o Afib, Eliquis on hold, for heparin. Last apixaban dose was 10am on 07/08/18. She is s/p upper endoscopy 07/14/18 and also s/p perc drain placement by IR , ex-lap and left sigmoid colectomy with colostomy on 07/16/18.  Plans for possible mesenteric angio later this week.   Heparin level therapeutic Hg low but stable, plt WNL. No active bleeding issues documented.  Goal of Therapy:  Heparin level 0.3-0.7 units/ml Monitor platelets by anticoagulation protocol: Yes   Plan:  Continue heparin drip at 1550 units/hr Next heparin level with AM labs Monitor daily heparin level and CBC, s/sx bleeding  Babs Bertin, PharmD, BCPS Please check AMION for all [facility] contact numbers Clinical Pharmacist 07/22/2018 3:58 PM

## 2018-07-23 ENCOUNTER — Inpatient Hospital Stay (HOSPITAL_COMMUNITY): Payer: Medicare Other

## 2018-07-23 LAB — COMPREHENSIVE METABOLIC PANEL
ALT: 35 U/L (ref 0–44)
AST: 69 U/L — ABNORMAL HIGH (ref 15–41)
Albumin: 1.3 g/dL — ABNORMAL LOW (ref 3.5–5.0)
Alkaline Phosphatase: 267 U/L — ABNORMAL HIGH (ref 38–126)
Anion gap: 8 (ref 5–15)
BUN: 13 mg/dL (ref 8–23)
CO2: 22 mmol/L (ref 22–32)
Calcium: 8.1 mg/dL — ABNORMAL LOW (ref 8.9–10.3)
Chloride: 103 mmol/L (ref 98–111)
Creatinine, Ser: 0.36 mg/dL — ABNORMAL LOW (ref 0.44–1.00)
GFR calc Af Amer: >60 mL/min (ref >60)
GFR calc non Af Amer: >60 mL/min (ref >60)
Glucose, Bld: 130 mg/dL — ABNORMAL HIGH (ref 70–99)
Potassium: 4.5 mmol/L (ref 3.5–5.1)
Sodium: 133 mmol/L — ABNORMAL LOW (ref 135–145)
Total Bilirubin: 0.6 mg/dL (ref 0.3–1.2)
Total Protein: 5.1 g/dL — ABNORMAL LOW (ref 6.5–8.1)

## 2018-07-23 LAB — CBC
HCT: 26.5 % — ABNORMAL LOW (ref 36.0–46.0)
Hemoglobin: 8.2 g/dL — ABNORMAL LOW (ref 12.0–15.0)
MCH: 25.5 pg — ABNORMAL LOW (ref 26.0–34.0)
MCHC: 30.9 g/dL (ref 30.0–36.0)
MCV: 82.6 fL (ref 80.0–100.0)
Platelets: 299 10*3/uL (ref 150–400)
RBC: 3.21 MIL/uL — ABNORMAL LOW (ref 3.87–5.11)
RDW: 18.3 % — ABNORMAL HIGH (ref 11.5–15.5)
WBC: 19.3 10*3/uL — ABNORMAL HIGH (ref 4.0–10.5)
nRBC: 0.7 % — ABNORMAL HIGH (ref 0.0–0.2)

## 2018-07-23 LAB — GLUCOSE, CAPILLARY
Glucose-Capillary: 112 mg/dL — ABNORMAL HIGH (ref 70–99)
Glucose-Capillary: 115 mg/dL — ABNORMAL HIGH (ref 70–99)
Glucose-Capillary: 119 mg/dL — ABNORMAL HIGH (ref 70–99)
Glucose-Capillary: 119 mg/dL — ABNORMAL HIGH (ref 70–99)
Glucose-Capillary: 120 mg/dL — ABNORMAL HIGH (ref 70–99)
Glucose-Capillary: 120 mg/dL — ABNORMAL HIGH (ref 70–99)

## 2018-07-23 LAB — PHOSPHORUS: Phosphorus: 3.1 mg/dL (ref 2.5–4.6)

## 2018-07-23 LAB — HEPARIN LEVEL (UNFRACTIONATED): Heparin Unfractionated: 0.66 IU/mL (ref 0.30–0.70)

## 2018-07-23 LAB — MAGNESIUM: Magnesium: 1.7 mg/dL (ref 1.7–2.4)

## 2018-07-23 MED ORDER — IOHEXOL 300 MG/ML  SOLN
100.0000 mL | Freq: Once | INTRAMUSCULAR | Status: AC | PRN
  Administered 2018-07-23: 100 mL via INTRAVENOUS

## 2018-07-23 MED ORDER — TRAVASOL 10 % IV SOLN
INTRAVENOUS | Status: AC
  Administered 2018-07-23: 18:00:00 via INTRAVENOUS
  Filled 2018-07-23: qty 864

## 2018-07-23 MED ORDER — MAGNESIUM SULFATE 2 GM/50ML IV SOLN
2.0000 g | Freq: Once | INTRAVENOUS | Status: AC
  Administered 2018-07-23: 2 g via INTRAVENOUS
  Filled 2018-07-23: qty 50

## 2018-07-23 NOTE — Progress Notes (Signed)
PROGRESS NOTE    [name]  PPI:[id number] DOB: 15-Aug-1945 DOA: 07/07/2018 PCP: Junie Spencer, [name]    Brief Narrative:  73 year old female with history of chronic N/V/abdominal pain, A. fib, PVD status post right femoral thrombectomy on 05/31/2018, hypertension and chronic back pain presenting with intractable nausea, vomiting, abdominal pain, new onset of diarrhea and about 30 pounds weight loss in the last several months.  Admitted to [facility]  on 07/07/2018 with somnolence and a diagnosis of sepsis. She has had a prior aortobifem by Dr Hart [place] in 2002 and had thrombectomy of the right limb of this by Dr Randie Heinz 3/20. 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 Her left internal iliac artery was patent at that point.   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 Also there is a 3.9 x 1.8 cm and 2.6 x 1.1 cm fluid collection in right pelvis/groin thought to be seroma from prior surgery.   Per H&P, interventional radiology at [facility] was consulted and consideration was being given for stenting her SMA which has about a (30-50% stenosis) to improve mesenteric blood flow.   Patient was transferred to [facility] for further management.  Patient was also treated with cefepime for presumed pneumonia.  Gastroenterology and vascular surgery consulted.  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  Biopsies taken.  She also had flexible sigmoidoscopy that showed only a 7 mm sessile polyp in the rectum but poor prep.  On 4/15, and  continued to have significant leukocytosis, mild temperature and abdominal tenderness.  This raised concern about intra-abdominal occult infection especially with the fluid collections noted on CT abdomen.  Infectious disease consulted and started patient on meropenem.  IR consulted and recommended getting repeat CT abdomen and pelvis which showed frank perforation of the descending colon appears to be contained within a portion of the omentum, or occluded celiac axis, slightly decreased pancreatic tail lesion but no previously noted fluid collections.  General surgery and IR consulted.  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 Derrell Lolling on 4/16.  Abdomen left open out of concern for infectious process and ileus. Started on TPN on 07/18/2018.  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 Derrell Lolling, 4/16 Postop ileus -Abdomen remains open -Bowel sounds present but no output in ostomy bag. -IR and general surgery following -VSS on board as well-plan angiogram for SMA stenosis before discharge -Pain control per surgery-PRN morphine, IV Tylenol and Robaxin. -Hemoglobin stable, mild worsening of Leukocytosis -TPN feeding started 4/18.  -Pharmacy to manage electrolyte abnormalities with TPN. -Continue meropenem 4/15--per ID.   For now no end date recommended. -Appreciate PT. Encourage incentive spirometry. -Continue zofran as needed. -Appreciate general surgery assistance.  Plan is to perform a CT abdomen and pelvis with contrast due to persistent ileus  and mild worsening of leukocytosis.  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 -Likely from occluded celiac axis -Continue PPI -Vascular  surgery following per above  Significant weight loss/moderate protein calorie malnutrition: Likely due to poor p.o. intake from the above. -Currently awaiting bowel function to feed. -TPN as tolerated -Colon biopsy from the surgery was negative for any malignancy, biopsy from body of stomach were suggestive of intestinal metaplasia but no malignancy.  History of COPD: Stable -Continue DuoNeb as needed   Peripheral vascular disease with dry gangrene: Stable. -Continue home statin -Outpatient vascular surgery follow-up -On therapeutic anticoagulation with IV heparin per pharmacy  Essential hypertension: Fairly controlled. -Continue home meds  Chronic atrial fibrillation: Not in RVR.   Chad2vasc score 6. -On Eliquis and metoprolol at home. -Continued on Heparin and metoprolol -Remains rate controlled  Chronic pain: -Pain medications as above -Continue Lyrica 200 3 times daily, low threshold to reduce the dose if the patient remains sleepy and confused  Tobacco use disorder: -Cessation counseling -Continue Nicotine patch. Stable  DVT prophylaxis: Heparin gtt Code Status: Full Family Communication: Pt in room, family not at bedside, discussed with daughter on phone on 07/23/2018. Disposition Plan: Uncertain at this time  Consultants:   Vascular surgery  Gastroenterology  Infectious disease  Interventional radiology  General surgery  Procedures:   EGD on 4/14  Flex sigmoidoscopy on 4/14  IR drain placement on 4/16 which showed a large left colon defect, 4/16  Ex Lap, LOA, Left sigmoid colectomy, descending colostomy (Hartmann's), Dr. Derrell Lolling, 4/16  Antimicrobials: Anti-infectives (From admission, onward)   Start     Dose/Rate Route Frequency Ordered Stop   07/15/18 1700  meropenem (MERREM) 1 g in sodium chloride 0.9 % 100 mL IVPB     1 g 200 mL/hr over 30 Minutes Intravenous Every 8 hours 07/15/18 1620     07/08/18 2300  vancomycin (VANCOCIN) 1,500  mg in sodium chloride 0.9 % 500 mL IVPB  Status:  Discontinued     1,500 mg 250 mL/hr over 120 Minutes Intravenous  Once 07/07/18 2128 07/07/18 2129   07/08/18 2300  vancomycin (VANCOCIN) 1,500 mg in sodium chloride 0.9 % 500 mL IVPB  Status:  Discontinued     1,500 mg 250 mL/hr over 120 Minutes Intravenous Every 24 hours 07/07/18 2129 07/08/18 0726   07/08/18 0600  ceFEPIme (MAXIPIME) 2 g in sodium chloride 0.9 % 100 mL IVPB  Status:  Discontinued     2 g 200 mL/hr over 30 Minutes Intravenous Every 8 hours 07/07/18 2124 07/13/18 1333   07/07/18 2130  vancomycin (VANCOCIN) 1,500 mg in sodium chloride 0.9 % 500 mL IVPB     1,500 mg 250 mL/hr over 120 Minutes Intravenous  Once 07/07/18 2123 07/08/18 0028   07/07/18 2115  vancomycin (VANCOCIN) IVPB 1000 mg/200 mL premix  Status:  Discontinued     1,000 mg 200 mL/hr over 60 Minutes Intravenous  Once 07/07/18 2110 07/07/18 2128   07/07/18 2115  ceFEPIme (MAXIPIME) 2 g in sodium chloride 0.9 % 100 mL IVPB     2 g 200 mL/hr over 30 Minutes Intravenous  Once 07/07/18 2110 07/07/18 2203      Subjective: Tired, no acute complaints no nausea no vomiting no fever no chills.  No significant output.  Objective: Vitals:   07/23/18 0409 07/23/18 0816 07/23/18 1100 07/23/18 1205  BP: (!) 147/59 (!) 165/66  (!) 146/62  Pulse: 77  70 72  Resp:   (!) 22  17  Temp: 98.4 F (36.9 C) 98.4 F (36.9 C)  98.4 F (36.9 C)  TempSrc: Oral Oral  Oral  SpO2: 98%  97% 95%  Weight: 65.8 kg     Height:        Intake/Output Summary (Last 24 hours) at 07/23/2018 1610 Last data filed at 07/23/2018 1423 Gross per 24 hour  Intake 1412.8 ml  Output 3320 ml  Net -1907.2 ml   Filed Weights   07/21/18 0452 07/22/18 0407 07/23/18 0409  Weight: 65.5 kg 64.5 kg 65.8 kg    Examination:  General exam: Appears calm and comfortable  Respiratory system: Clear to auscultation. Respiratory effort normal. Cardiovascular system: S1 & S2 heard, RRR Gastrointestinal  system: Soft, nondistended, mildly tender Central nervous system: Alert and oriented. No focal neurological deficits. Extremities: Symmetric 5 x 5 power. Skin: No rashes, lesions Psychiatry: Judgement and insight appear normal. Mood & affect appropriate.   Data Reviewed: I have personally reviewed following labs and imaging studies  CBC: Recent Labs  Lab 07/19/18 0432 07/20/18 0536 07/21/18 0541 07/22/18 0643 07/23/18 0548  WBC 26.1* 21.4* 19.6* 18.0* 19.3*  NEUTROABS 22.7* 17.2*  --   --   --   HGB 7.5* 8.5* 8.3* 8.2* 8.2*  HCT 25.7* 28.8* 27.4* 27.1* 26.5*  MCV 86.5 83.2 83.5 82.6 82.6  PLT 283 295 267 296 299   Basic Metabolic Panel: Recent Labs  Lab 07/18/18 1036 07/19/18 0432 07/20/18 0536 07/21/18 0541 07/22/18 0643 07/23/18 0548  NA  --  140 135 134* 133* 133*  K  --  3.2* 4.2 4.5 4.3 4.5  CL  --  109 103 103 103 103  CO2  --  28 23 23 23 22   GLUCOSE  --  138* 126* 104* 106* 130*  BUN  --  7* 11 13 13 13   CREATININE  --  0.40* 0.43* 0.34* 0.35* 0.36*  CALCIUM  --  7.7* 8.0* 8.1* 8.0* 8.1*  MG 1.6* 1.7 1.5* 1.9  --  1.7  PHOS 1.9* 1.9* 2.2* 2.9  --  3.1   GFR: Estimated Creatinine Clearance: 62.7 mL/min (A) (by C-G formula based on SCr of 0.36 mg/dL (L)). Liver Function Tests: Recent Labs  Lab 07/19/18 0432 07/20/18 0536 07/23/18 0548  AST 71* 46* 69*  ALT 36 32 35  ALKPHOS 105 143* 267*  BILITOT 0.5 0.6 0.6  PROT 4.7* 5.2* 5.1*  ALBUMIN 1.3* 1.3* 1.3*   No results for input(s): LIPASE, AMYLASE in the last 168 hours. No results for input(s): AMMONIA in the last 168 hours. Coagulation Profile: No results for input(s): INR, PROTIME in the last 168 hours. Cardiac Enzymes: No results for input(s): CKTOTAL, CKMB, CKMBINDEX, TROPONINI in the last 168 hours. BNP (last 3 results) No results for input(s): PROBNP in the last 8760 hours. HbA1C: No results for input(s): HGBA1C in the last 72 hours. CBG: Recent Labs  Lab 07/22/18 2022 07/23/18 0007  07/23/18 0407 07/23/18 0841 07/23/18 1203  GLUCAP 112* 120* 119* 119* 120*   Lipid Profile: No results for input(s): CHOL, HDL, LDLCALC, TRIG, CHOLHDL, LDLDIRECT in the last 72 hours. Thyroid Function Tests: No results for input(s): TSH, T4TOTAL, FREET4, T3FREE, THYROIDAB in the last 72 hours. Anemia Panel: No results for input(s): VITAMINB12, FOLATE, FERRITIN, TIBC, IRON, RETICCTPCT in the last 72 hours. Sepsis Labs: No results for input(s): PROCALCITON, LATICACIDVEN in the last 168 hours.  No results found for this or any previous visit (from the past 240 hour(s)).  Radiology Studies: No results found.  Scheduled Meds: . atorvastatin  40 mg Oral Daily  . Chlorhexidine Gluconate Cloth  6 each Topical Q0600  . feeding supplement  1 Container Oral BID BM  . insulin aspart  0-9 Units Subcutaneous Q4H  . lidocaine  1 patch Transdermal Q24H  . metoprolol tartrate  75 mg Oral BID  . nicotine  21 mg Transdermal Daily  . nutrition supplement (JUVEN)  1 packet Oral BID BM  . pantoprazole (PROTONIX) IV  40 mg Intravenous Q24H  . pregabalin  200 mg Oral TID  . sodium chloride flush  10-40 mL Intracatheter Q12H   Continuous Infusions: . sodium chloride 10 mL/hr at 07/22/18 2300  . heparin 1,550 Units/hr (07/23/18 0555)  . meropenem (MERREM) IV 1 g (07/23/18 1503)  . methocarbamol (ROBAXIN) IV 500 mg (07/23/18 1417)  . TPN ADULT (ION) 83 mL/hr at 07/22/18 2300  . TPN ADULT (ION)       LOS: 16 days   Lynden Oxford, MD [facility] Pager On Amion  If 7PM-7AM, please contact night-coverage 07/23/2018, 4:10 PM

## 2018-07-23 NOTE — Progress Notes (Signed)
ANTICOAGULATION CONSULT NOTE - Follow Up Consult  Pharmacy Consult for Heparin Indication: atrial fibrillation  No Known Allergies  Patient Measurements: Height: 5\' 7"  (170.2 cm) Weight: 145 lb 1 oz (65.8 kg) IBW/kg (Calculated) : 61.6 Heparin Dosing Weight: 65.8 kg  Vital Signs: Temp: 98.4 F (36.9 C) (04/23 0816) Temp Source: Oral (04/23 0816) BP: 165/66 (04/23 0816) Pulse Rate: 77 (04/23 0409)  Labs: Recent Labs    07/21/18 0541  07/22/18 0643 07/22/18 1442 07/23/18 0548  HGB 8.3*  --  8.2*  --  8.2*  HCT 27.4*  --  27.1*  --  26.5*  PLT 267  --  296  --  299  HEPARINUNFRC 0.13*   < > 0.25* 0.41 0.66  CREATININE 0.34*  --  0.35*  --  0.36*   < > = values in this interval not displayed.    Estimated Creatinine Clearance: 62.7 mL/min (A) (by C-G formula based on SCr of 0.36 mg/dL (L)).  Assessment:   Anticoag: Eliquis PTA>Heparin. New afib and not clear when the DOAC started. Was on PTA but not at the last clinic visit. S/p GI procedure and left abd abscess drain placement 4/16. Heparin resumed 4/17. - HL 0.66 in goal. Hgb and plts stable.  Goal of Therapy:  Heparin level 0.3-0.7 units/ml Monitor platelets by anticoagulation protocol: Yes   Plan:  Continue heparin drip at 1550 units/hr Monitor daily heparin level and CBC, s/sx bleeding    Esmee Fallaw S. Merilynn Finland, PharmD, BCPS Clinical Staff Pharmacist Misty Stanley Stillinger 07/23/2018,9:48 AM

## 2018-07-23 NOTE — Progress Notes (Signed)
Patient unable to drink contrast for CT scan. CT and MD notified. Ok to proceed without oral contrast.  Ernestina Columbia, RN

## 2018-07-23 NOTE — Progress Notes (Signed)
IR rounding note via telephone per new regulations. Spoke with Star, Charity fundraiser.  Colon perforation with extraluminal gas collection s/p drain placement 07/16/2018 by Dr. Lowella Dandy. Of note, following drain placement patient went to OR and underwent an exploratory laparotomy (left sigmoid colectomy, splenic flexor mobilization, descending colostomy) and drain placement 07/16/2018 by Dr. Derrell Lolling. Therefore, patient currently has two drains- one surgical and one IR drain.   RN reports drains are labeled 1 and 2, but unsure of which drain is surgical and which is IR. Reports drain 1 c/d/i with minimal surrounding erythema, scant output of clear light brown fluid in JP drain. Reports drain 2 c/d/i with minimal surrounding erythema, about 10 cc clear yellow fluid in JP drain today.  Continue with current drain management- continue with Qshift flushes and monitor of output. Appreciate and agree with CCS management. IR to follow.  Danielle Boga Izadora Roehr, PA-C 07/23/2018, 10:55 AM

## 2018-07-23 NOTE — Progress Notes (Signed)
7 Days Post-Op    CC:  Perforated colon  Subjective: No real change, despite being on clears she is taking almost nothing PO.  Nothing in her ostomy bag this AM either.  Her wound looks good on the sides the base is no better and continues have fat necrosis, white fibrous tissue at the base with more loose suture present.  She also appears to be just worn out.  Objective: Vital signs in last 24 hours: Temp:  [98.2 F (36.8 C)-98.8 F (37.1 C)] 98.4 F (36.9 C) (04/23 0816) Pulse Rate:  [69-77] 77 (04/23 0409) Resp:  [16-19] 16 (04/23 0000) BP: (132-165)/(46-66) 165/66 (04/23 0816) SpO2:  [90 %-99 %] 98 % (04/23 0409) Weight:  [65.8 kg] 65.8 kg (04/23 0409) Last BM Date: 07/22/18 150 PO recorded 900 IV 1800 urine Drain 10 left medial/20 LLQ 20 cc from ostomy recorded Afebrile, VSS Alk phos  267 WBC still up   Intake/Output from previous day: 04/22 0701 - 04/23 0700 In: 1142.8 [P.O.:150; I.V.:692.8; IV Piggyback:300] Out: 1850 [Urine:1800; Drains:30; Stool:20] Intake/Output this shift: Total I/O In: 150 [P.O.:150] Out: 700 [Urine:700]  General appearance: alert and worn out and lethargic.  Moving in bed is an effort. GI: soft, BS hypoactive, nothing but sweat in the ostomy bag this AM.  Ostomy OK, still with some swelling a darker pink color.  Midline wound still has no sign of granulation at the base as described above.  Sides of the wounds look pretty good.   Minimal drainage in each drain no change in color of the drainage.     Lab Results:  Recent Labs    07/22/18 0643 07/23/18 0548  WBC 18.0* 19.3*  HGB 8.2* 8.2*  HCT 27.1* 26.5*  PLT 296 299    BMET Recent Labs    07/22/18 0643 07/23/18 0548  NA 133* 133*  K 4.3 4.5  CL 103 103  CO2 23 22  GLUCOSE 106* 130*  BUN 13 13  CREATININE 0.35* 0.36*  CALCIUM 8.0* 8.1*   PT/INR No results for input(s): LABPROT, INR in the last 72 hours.  Recent Labs  Lab 07/19/18 0432 07/20/18 0536 07/23/18 0548   AST 71* 46* 69*  ALT 36 32 35  ALKPHOS 105 143* 267*  BILITOT 0.5 0.6 0.6  PROT 4.7* 5.2* 5.1*  ALBUMIN 1.3* 1.3* 1.3*     Lipase     Component Value Date/Time   LIPASE 65 (H) 07/08/2018 1325     Medications: . atorvastatin  40 mg Oral Daily  . Chlorhexidine Gluconate Cloth  6 each Topical Q0600  . feeding supplement  1 Container Oral BID BM  . insulin aspart  0-9 Units Subcutaneous Q4H  . lidocaine  1 patch Transdermal Q24H  . metoprolol tartrate  75 mg Oral BID  . nicotine  21 mg Transdermal Daily  . nutrition supplement (JUVEN)  1 packet Oral BID BM  . pantoprazole (PROTONIX) IV  40 mg Intravenous Q24H  . pregabalin  200 mg Oral TID  . sodium chloride flush  10-40 mL Intracatheter Q12H   . sodium chloride 10 mL/hr at 07/22/18 2300  . heparin 1,550 Units/hr (07/23/18 0555)  . meropenem (MERREM) IV 1 g (07/23/18 0601)  . methocarbamol (ROBAXIN) IV 500 mg (07/23/18 0506)  . TPN ADULT (ION) 83 mL/hr at 07/22/18 2300    Assessment/Plan A. 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 Rosendo Gros, 4/16POD #6 - BID WTD dressing changes Expectedpost-op ileus. No bowel function throughout colostomy. NPO - Patient pulled NG tube overnight04/17. Re-insert if develops N/V or abdominal distension. - Mobilize as tolerated. IS/pulm toliet.PT evaluation yesterday SNF versus home health with PT pending progress.  FEN- clears from the floor/nutrition consult, IVF,TPN  (4/19) VTE -SCDs, heparindrip ID -Meropenem 4/15 >> per ID,day8;WBC26.1>> 21.4,>>19.6(4/21),afebrile Foley - Removed POD 1 Follow up -Dr. Rosendo Gros  Plan:  I have ask Dietician to see and try to get some calories and protein in. I'm OK with Ensure if she will take it.  Leave her on clears, TPN for nutrition for now.  Not sure what to do with WBC.       LOS: 16 days    [name] 07/23/2018 [phone]

## 2018-07-23 NOTE — Progress Notes (Signed)
Abdominal wound dressing changed per MD order. Patient tolerated well.  Ernestina Columbia, RN

## 2018-07-23 NOTE — Plan of Care (Signed)
  Problem: Health Behavior/Discharge Planning: Goal: Ability to manage health-related needs will improve Outcome: Not Progressing   Problem: Clinical Measurements: Goal: Will remain free from infection Outcome: Not Progressing Goal: Diagnostic test results will improve Outcome: Not Progressing   Problem: Nutrition: Goal: Adequate nutrition will be maintained Outcome: Not Progressing   

## 2018-07-23 NOTE — Progress Notes (Signed)
   Patient denies any new abdominal complaints to me today.  Has persistent leukocytosis.  Tentatively plan angiogram prior to discharge probably early next week.  If cannot be arranged this could be done as an outpatient.  Shatonia Hoots C. Randie Heinz, MD [facility] Office: [phone] Pager: [phone]

## 2018-07-23 NOTE — Progress Notes (Signed)
PHARMACY - ADULT TOTAL PARENTERAL NUTRITION CONSULT NOTE   Pharmacy Consult for TPN Indication: Post-op ileus  Patient Measurements: Height: 5\' 7"  (170.2 cm) Weight: 145 lb 1 oz (65.8 kg) IBW/kg (Calculated) : 61.6 TPN AdjBW (KG): 62.3 Body mass index is 22.72 kg/m.  Assessment: 73-yo female with h/o chronic N/V/abdominal pain, A. fib, PVD status post right femoral thrombectomy on 05/31/2018, hypertension and chronic back pain presenting with intractable nausea, vomiting, abdominal pain, new onset of diarrhea and about 30 pounds weight loss in the last several months.   [facility] with a diagnosis of sepsis. 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 Also there is a 3.9 x 1.8 cm and 2.6 x 1.1 cm fluid collection in right pelvis/groin thought to be seroma from prior surgery. [facility] for stenting her SMA which has about a (30-50% stenosis) to improve mesenteric blood flow.   Patient was transferred to [facility] management. S/p upper endoscopy 07/14/18 found Severe ulcerative gastritis most consistent ischemia, biopsied, results pending. CT showed perforation of descending colon. 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 Now with post-op ileus in the setting of 30 pound weight loss and minimal intake since admission.  GI: 2 x JP drains with 22 mls output, 20 mls from colostomy, LBM 4/22; Prealbumin <5. NGT pulled out 4/17- no plan to replace unless develops N/V or abdominal distension. Endo: No history of diabetes; CBGs <180 Insulin requirements in  the past 24 hours: 1 unit Lytes: Na slightly low at 133 (increased in TPN); Corr Ca upper end at 10.3 (Alb 1.3); K & Phos wnl, Mg low at 1.7 (goal >2; will replace) Renal: Scr 0.36; BUN 13; UOP 1.9 ml/kg/hr  Pulm: RA Cards: HTN improving; HR 70s on metoprolol and heparin drip Hepatobil:AST/ALT 69/35 (bumped back up some); TBili 0.6; TGs 65 Neuro: No complaints. Pain fairly controlled. ID: WBC slow decline to 19.3; afebrile; Meropenem 4/15>>  TPN Access: PICC requested for today (4/18) TPN start date: 4/18 Nutritional Goals (per RD recommendation on 4/22): Kcal:  2000-2200 kcals (32-36 kcal/kg bw) Protein:  112-125g Pro  (1.8-2g/kg bw) Fluid:  2-2.2 L (681ml/kcal)  Goal TPN rate is 83 ml/hr (This TPN will provide 120 g of protein, 269 g of dextrose, and 67 g of lipids which provides 2003  kCals per day, meeting 100% of patient needs)  Current Nutrition:  Clear liquids Boost 1 po BID - took both 4/22 (this provided 500 kcal and 18g protein) Juven 1 po BID - took 1 on 4/22 (this provided 95 kcal and 2.5g protein) TPN at 83 ml/hr  Plan:  Given enteral supplement intake, will reduce TPN to 60 ml/hr . This TPN will provide 86.4 g of protein, 194.4 g of dextrose, and 49 g of lipids which provides 1496 kCals per day, meeting 97% of patient needs with addition of enteral supplements Electrolytes in TPN: Standard except increase Na and increase Mg Add MVI to TPN - trace elements are on back order will only place in TPN on Monday, Wednesday and Friday Continue sensitive SSI and adjust  as needed Magnesium 2g IV x1 today Add Thiamine and Folate toTPN Monitor TPN labs Mon and Thurs.  F/u daily BMP as ordered by MD F/u enteral intake and ability to advance diet - would like for this patient to be able to stop TPN with ongoing infectious process.   Link Snuffer, PharmD, BCPS, BCCCP Clinical Pharmacist Please see AMION for all Pharmacists' Contact Phone Numbers 07/23/2018, 8:06 AM

## 2018-07-24 DIAGNOSIS — M6250 Muscle wasting and atrophy, not elsewhere classified, unspecified site: Secondary | ICD-10-CM

## 2018-07-24 DIAGNOSIS — K651 Peritoneal abscess: Secondary | ICD-10-CM

## 2018-07-24 LAB — MAGNESIUM: Magnesium: 1.9 mg/dL (ref 1.7–2.4)

## 2018-07-24 LAB — BASIC METABOLIC PANEL
Anion gap: 6 (ref 5–15)
BUN: 12 mg/dL (ref 8–23)
CO2: 23 mmol/L (ref 22–32)
Calcium: 7.9 mg/dL — ABNORMAL LOW (ref 8.9–10.3)
Chloride: 103 mmol/L (ref 98–111)
Creatinine, Ser: 0.4 mg/dL — ABNORMAL LOW (ref 0.44–1.00)
GFR calc Af Amer: >60 mL/min (ref >60)
GFR calc non Af Amer: >60 mL/min (ref >60)
Glucose, Bld: 116 mg/dL — ABNORMAL HIGH (ref 70–99)
Potassium: 4.3 mmol/L (ref 3.5–5.1)
Sodium: 132 mmol/L — ABNORMAL LOW (ref 135–145)

## 2018-07-24 LAB — GLUCOSE, CAPILLARY
Glucose-Capillary: 106 mg/dL — ABNORMAL HIGH (ref 70–99)
Glucose-Capillary: 106 mg/dL — ABNORMAL HIGH (ref 70–99)
Glucose-Capillary: 110 mg/dL — ABNORMAL HIGH (ref 70–99)
Glucose-Capillary: 112 mg/dL — ABNORMAL HIGH (ref 70–99)
Glucose-Capillary: 122 mg/dL — ABNORMAL HIGH (ref 70–99)
Glucose-Capillary: 136 mg/dL — ABNORMAL HIGH (ref 70–99)
Glucose-Capillary: 137 mg/dL — ABNORMAL HIGH (ref 70–99)

## 2018-07-24 LAB — HEPARIN LEVEL (UNFRACTIONATED)
Heparin Unfractionated: 0.88 IU/mL — ABNORMAL HIGH (ref 0.30–0.70)
Heparin Unfractionated: 0.84 IU/mL — ABNORMAL HIGH (ref 0.30–0.70)
Heparin Unfractionated: 0.71 IU/mL — ABNORMAL HIGH (ref 0.30–0.70)

## 2018-07-24 LAB — CBC
HCT: 25.8 % — ABNORMAL LOW (ref 36.0–46.0)
Hemoglobin: 7.9 g/dL — ABNORMAL LOW (ref 12.0–15.0)
MCH: 24.8 pg — ABNORMAL LOW (ref 26.0–34.0)
MCHC: 30.6 g/dL (ref 30.0–36.0)
MCV: 81.1 fL (ref 80.0–100.0)
Platelets: 357 10*3/uL (ref 150–400)
RBC: 3.18 MIL/uL — ABNORMAL LOW (ref 3.87–5.11)
RDW: 18.5 % — ABNORMAL HIGH (ref 11.5–15.5)
WBC: 19.3 10*3/uL — ABNORMAL HIGH (ref 4.0–10.5)
nRBC: 0.9 % — ABNORMAL HIGH (ref 0.0–0.2)

## 2018-07-24 MED ORDER — SACCHAROMYCES BOULARDII 250 MG PO CAPS
250.0000 mg | ORAL_CAPSULE | Freq: Two times a day (BID) | ORAL | Status: DC
  Administered 2018-07-24 – 2018-08-08 (×29): 250 mg via ORAL
  Filled 2018-07-24 (×30): qty 1

## 2018-07-24 MED ORDER — ENSURE ENLIVE PO LIQD
237.0000 mL | Freq: Three times a day (TID) | ORAL | Status: DC
  Administered 2018-07-24 – 2018-08-08 (×22): 237 mL via ORAL
  Filled 2018-07-24: qty 237

## 2018-07-24 MED ORDER — TRAVASOL 10 % IV SOLN
INTRAVENOUS | Status: AC
  Administered 2018-07-24: 17:00:00 via INTRAVENOUS
  Filled 2018-07-24: qty 1195.2

## 2018-07-24 MED ORDER — MAGNESIUM SULFATE IN D5W 1-5 GM/100ML-% IV SOLN
1.0000 g | Freq: Once | INTRAVENOUS | Status: AC
  Administered 2018-07-24: 1 g via INTRAVENOUS
  Filled 2018-07-24: qty 100

## 2018-07-24 NOTE — Progress Notes (Addendum)
ANTICOAGULATION CONSULT NOTE - Follow Up Consult  Pharmacy Consult for Heparin Indication: atrial fibrillation  No Known Allergies  Patient Measurements: Height: 5\' 7"  (170.2 cm) Weight: 145 lb 1 oz (65.8 kg) IBW/kg (Calculated) : 61.6 Heparin Dosing Weight: 65.8 kg  Vital Signs: Temp: 98.9 F (37.2 C) (04/23 1946) Temp Source: Oral (04/23 1946) BP: 151/65 (04/23 1946) Pulse Rate: 80 (04/23 1946)  Labs: Recent Labs    07/22/18 0643 07/22/18 1442 07/23/18 0548 07/24/18 0347  HGB 8.2*  --  8.2* 7.9*  HCT 27.1*  --  26.5* 25.8*  PLT 296  --  299 357  HEPARINUNFRC 0.25* 0.41 0.66 0.88*  CREATININE 0.35*  --  0.36* 0.40*    Estimated Creatinine Clearance: 62.7 mL/min (A) (by C-G formula based on SCr of 0.4 mg/dL (L)).  Assessment:   Anticoag: Eliquis PTA>Heparin. New afib and not clear when the DOAC started. Was on PTA but not at the last clinic visit. S/p GI procedure and left abd abscess drain placement 4/16. Heparin resumed 4/17. - Heparin level is supra-therapeutic at 0.88. Hgb and plts low but relatively stable. No bleeding noted per RN.  Goal of Therapy:  Heparin level 0.3-0.7 units/ml Monitor platelets by anticoagulation protocol: Yes   Plan:  Decrease heparin drip to 1450 units/hr Check 8 hour heparin level Monitor daily heparin level and CBC, s/sx bleeding    Danae Orleans, PharmD PGY1 Pharmacy Resident Phone [phone] 07/24/2018       4:50 AM

## 2018-07-24 NOTE — Progress Notes (Signed)
PHARMACY - ADULT TOTAL PARENTERAL NUTRITION CONSULT NOTE   Pharmacy Consult for TPN Indication: Post-op ileus  Patient Measurements: Height: 5\' 7"  (170.2 cm) Weight: 139 lb 12.4 oz (63.4 kg) IBW/kg (Calculated) : 61.6 TPN AdjBW (KG): 62.3 Body mass index is 21.89 kg/m.  Assessment: 16-yo female with h/o chronic N/V/abdominal pain, A. fib, PVD status post right femoral thrombectomy on 05/31/2018, hypertension and chronic back pain presenting with intractable nausea, vomiting, abdominal pain, new onset of diarrhea and about 30 pounds weight loss in the last several months.   She was admitted to [facility] with a diagnosis of sepsis. 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 Also there is a 3.9 x 1.8 cm and 2.6 x 1.1 cm fluid collection in right pelvis/groin thought to be seroma from prior surgery. IR at [facility] was consulted for stenting her SMA which has about a (30-50% stenosis) to improve mesenteric blood flow.   Patient was transferred to [facility] for further management. S/p upper endoscopy 07/14/18 found Severe ulcerative gastritis most consistent ischemia, biopsied, results pending. CT showed perforation of descending colon. 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 Now with post-op ileus in the setting of 30 pound weight loss and minimal intake since admission.   GI: 2 x JP drains with 25 mls output, nothing from colostomy, LBM 4/22; Prealbumin <5. NGT pulled out 4/17- no plan to replace unless develops N/V or abdominal distension. Endo: No history of diabetes; CBGs <180 Insulin  requirements in the past 24 hours: none Lytes: Na slightly low at 132 (increased in TPN); Corr Ca upper end at 10.1 (Alb 1.3); K & Phos wnl, Mg low at 1.9 (goal >2; will replace) Renal: Scr 0.4; BUN 12; UOP 2.8 ml/kg/hr, net +6L Pulm: RA Cards: SBP improved to 120-130; HR 70s on metoprolol and heparin drip Hepatobil:AST/ALT 69/35 (bumped back up some); TBili 0.6; TGs 65 Neuro: No complaints. Pain fairly controlled. ID: WBC slow decline to 19.3; afebrile; Meropenem 4/15>>  TPN Access: PICC requested for today (4/18) TPN start date: 4/18 Nutritional Goals (per RD recommendation on 4/22): Kcal:  2000-2200 kcals (32-36 kcal/kg bw) Protein:  112-125g Pro  (1.8-2g/kg bw) Fluid:  2-2.2 L (31ml/kcal)  Goal TPN rate is 83 ml/hr (This TPN will provide 120 g of protein, 269 g of dextrose, and 67 g of lipids which provides 2003  kCals per day, meeting 100% of patient needs)  Current Nutrition:  Clear liquids - not much due to lack of appetite per RN Boost 1 po BID - refused both, does not like taste (each provides 500 kcal and 18g protein) Juven 1 po BID - took 1 on 4/23 (this provided 95 kcal and 2.5g protein) TPN at 83 ml/hr  Plan:  Increase TPN to 83 ml/hr in light of not taking POs well (discussed with RD) This TPN will provide 120 g of protein, 269 g of dextrose, and 67 g of lipids which provides 2003  kCals per day, meeting 100% of patient needs Electrolytes in TPN: Standard except increase Na and increase Mg Add MVI to TPN - trace elements are on back order,  will only place in TPN on Monday, Wednesday and Friday Continue sensitive SSI and adjust as needed Magnesium 1g IV x1 today Add Thiamine and Folate toTPN Monitor TPN labs Mon and Thurs.  F/u daily BMP as ordered by MD F/u enteral intake and ability to advance diet as able - would like for this patient to be able to stop TPN with ongoing infectious process.    Loura BackJennifer Antelope, PharmD, BCPS Clinical Pharmacist Clinical phone for  07/24/2018 until 3p is [phone] 07/24/2018 7:19 AM  **Pharmacist phone directory can now be found on [email or web address] listed under [facility]**

## 2018-07-24 NOTE — Progress Notes (Signed)
Nutrition Follow-up  DOCUMENTATION CODES:   Severe malnutrition in context of acute illness/injury  INTERVENTION:    Ensure Enlive po TID, each supplement provides 350 kcal and 20 grams of protein  Continue TPN @ 83 ml/hr to provide 2003 kcal and 120 grams protein  D/C Juven until appetite progresses  NUTRITION DIAGNOSIS:   Severe Malnutrition related to acute illness(probable ischemic colitis) as evidenced by (21% wt loss in 4 months and an estimated energy intake < or equal to 50% for > or equal to 5 days.).  Ongoing  GOAL:   Patient will meet greater than or equal to 90% of their needs  Met with TPN  MONITOR:   PO intake, Supplement acceptance, Labs, Skin, I & O's, Weight trends  REASON FOR ASSESSMENT:   Consult New TPN/TNA  ASSESSMENT:   73 y/o female PMHx GERD, HLD, HTN, PVD s/p aortofem bypass, s/p RLE thromboembolectomy and R common emoral artery patch angioplasty 05/31/18, and tobacco abuse. Presents this admission with sepsis secondary to PNA and probable ischemic colitis.    4/14- flex sig  4/15- repeat CT revealed self contained perforation. NPO 4/16- exlap, LOA, L sigmoid colectomy w/ colostomy and 2x JP drain placement, midline wound left open 4/17- NGT pulled out by pt 4/19 TPN start 4/24- diet advanced to full liquid   Spoke with pt at bedside. She took a couple of sips of Juven and had ~3 oz of Ensure yesterday. She reports her appetite has declined since surgery and she hasn't wanted to eat much. Encouraged pt to drink Ensure as it is a complete supplement and provides the most kcal/protein. Will d/c Juven until appetite progresses.   TPN was reduced to 60 ml/hr yesterday. Spoke with Pharmacist about increasing back to goal today given pt's ongoing poor intake. Pt remain without output in her ostomy. Per surgery suspect prolonged ileus.   If appetite remains poor and ileus resolves may need to consider initiation of enteral feedings as pt is severely  malnourished.   Weight noted to decrease from 64.5 kg on 4/22 to 63.4 kg today.   I/O: +5,366 ml since 4/10 UOP: 4,200 ml x 24 hrs  Drains: 25 ml x 24 hrs   Medications: SS novolog Labs: Na 132 (L) CBG 110-120  NUTRITION - FOCUSED PHYSICAL EXAM:    Most Recent Value  Orbital Region  Moderate depletion  Upper Arm Region  Severe depletion  Thoracic and Lumbar Region  Unable to assess  Buccal Region  Moderate depletion  Temple Region  Severe depletion  Clavicle Bone Region  Severe depletion  Clavicle and Acromion Bone Region  Severe depletion  Scapular Bone Region  Unable to assess  Dorsal Hand  Severe depletion  Patellar Region  Severe depletion  Anterior Thigh Region  Severe depletion  Posterior Calf Region  Severe depletion  Edema (RD Assessment)  None  Hair  Reviewed  Eyes  Reviewed  Mouth  Reviewed  Skin  Reviewed  Nails  Reviewed      Diet Order:   Diet Order            Diet full liquid Room service appropriate? No; Fluid consistency: Thin  Diet effective now              EDUCATION NEEDS:   Education needs have been addressed  Skin:  Skin Assessment: Skin Integrity Issues: Skin Integrity Issues:: Incisions, Other (Comment) Incisions: OPEN Surgical incision to R + L abdomen (closed at fascia) and to R groin Other:  Venous statis ulcer to L toe  Last BM:  awaitng bowel return via ostomy  Height:   Ht Readings from Last 1 Encounters:  07/14/18 5' 7"  (1.702 m)    Weight:   Wt Readings from Last 1 Encounters:  07/24/18 63.4 kg    Ideal Body Weight:  61.4 kg  BMI:  Body mass index is 21.89 kg/m.  Estimated Nutritional Needs:   Kcal:  2000-2200 kcals (32-36 kcal/kg bw)  Protein:  112-125g Pro  (1.8-2g/kg bw)  Fluid:  2-2.2 L (49m/kcal)   CMariana SingleRD, LDN Clinical Nutrition Pager # [phone]

## 2018-07-24 NOTE — Progress Notes (Signed)
ANTICOAGULATION CONSULT NOTE - Follow Up Consult  Pharmacy Consult for Heparin Indication: atrial fibrillation  No Known Allergies  Patient Measurements: Height: 5\' 7"  (170.2 cm) Weight: 139 lb 12.4 oz (63.4 kg) IBW/kg (Calculated) : 61.6 Heparin Dosing Weight: 65.8 kg  Vital Signs: Temp: 98.4 F (36.9 C) (04/24 2023) Temp Source: Oral (04/24 2023) BP: 131/58 (04/24 2023) Pulse Rate: 83 (04/24 2023)  Labs: Recent Labs    07/22/18 6734  07/23/18 0548 07/24/18 0347 07/24/18 1216 07/24/18 2215  HGB 8.2*  --  8.2* 7.9*  --   --   HCT 27.1*  --  26.5* 25.8*  --   --   PLT 296  --  299 357  --   --   HEPARINUNFRC 0.25*   < > 0.66 0.88* 0.84* 0.71*  CREATININE 0.35*  --  0.36* 0.40*  --   --    < > = values in this interval not displayed.    Estimated Creatinine Clearance: 62.7 mL/min (A) (by C-G formula based on SCr of 0.4 mg/dL (L)).  Assessment:   Anticoag: Eliquis PTA>Heparin. New afib and not clear when the DOAC started. Was on PTA but not at the last clinic visit. S/p GI procedure and left abd abscess drain placement 4/16. Heparin resumed 4/17. - Heparin level remains supra-therapeutic at 0.84. Hgb and plts low but relatively stable. No bleeding noted per RN.  4/24 PM update: heparin level still just above goal, no issues per RN.   Goal of Therapy:  Heparin level 0.3-0.7 units/ml Monitor platelets by anticoagulation protocol: Yes   Plan:  Decrease heparin drip to 1200 units/hr Heparin level with AM labs Monitor CBC, s/sx bleeding    Abran Duke, PharmD, BCPS Clinical Pharmacist Phone: [phone]

## 2018-07-24 NOTE — Progress Notes (Signed)
Pharmacy Antibiotic Note  UTHA FREDE is a 73 y.o. female admitted on 07/07/2018 with pneumonia.  . Completed 7 days cefepime for PNA/sepsis.  Patient with leukocytosis and intra-abdominal fluid collections. ID service ordered on 4/15 to start meropenem for descending colon perforation.  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 Derrell Lolling on 4/16 Afebrile, WBC 26.1>21.4>19.3, Scr stable ~ 0.4. Awaiting bowel function return.  Plan: Continue Meropenem 1g IV q8 hours  Height: 5\' 7"  (170.2 cm) Weight: 139 lb 12.4 oz (63.4 kg) IBW/kg (Calculated) : 61.6  Temp (24hrs), Avg:98.8 F (37.1 C), Min:98.5 F (36.9 C), Max:99 F (37.2 C)  Recent Labs  Lab 07/20/18 0536 07/21/18 0541 07/22/18 0643 07/23/18 0548 07/24/18 0347  WBC 21.4* 19.6* 18.0* 19.3* 19.3*  CREATININE 0.43* 0.34* 0.35* 0.36* 0.40*    Estimated Creatinine Clearance: 62.7 mL/min (A) (by C-G formula based on SCr of 0.4 mg/dL (L)).    No Known Allergies  Antimicrobials this admission: Cefepime 4/7 >> 4/13 Vanco 4/7 >> 4/8 Meropenem 4/15>   Microbiology results: 4/7 BCx x 2: negative 4/8 mrsa pcr negative    Thank you for allowing pharmacy to be a part of this patient's care.  Jenetta Downer, [facility] Clinical Pharmacist Phone [phone]  07/24/2018 1:26 PM

## 2018-07-24 NOTE — Progress Notes (Signed)
Patient has not been able to ambulate the hall because of extreme weakness. Patient has used the bedside commode a few times throughout the day with assistance.

## 2018-07-24 NOTE — Progress Notes (Signed)
PROGRESS NOTE    [name]  UEA:[id number] DOB: 1946-03-07 DOA: 07/07/2018 PCP: Junie Spencer, [name]    Brief Narrative:  73 year old female with history of chronic N/V/abdominal pain, A. fib, PVD status post right femoral thrombectomy on 05/31/2018, hypertension and chronic back pain presenting with intractable nausea, vomiting, abdominal pain, new onset of diarrhea and about 30 pounds weight loss in the last several months.  Admitted to [facility]  on 07/07/2018 with somnolence and a diagnosis of sepsis. She has had a prior aortobifem by Dr Hart [place] in 2002 and had thrombectomy of the right limb of this by Dr Randie Heinz 3/20. 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 Her left internal iliac artery was patent at that point.   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 Also there is a 3.9 x 1.8 cm and 2.6 x 1.1 cm fluid collection in right pelvis/groin thought to be seroma from prior surgery.   Per H&P, interventional radiology at [facility] was consulted and consideration was being given for stenting her SMA which has about a (30-50% stenosis) to improve mesenteric blood flow.   Patient was transferred to [facility] for further management.  Patient was also treated with cefepime for presumed pneumonia.  Gastroenterology and vascular surgery consulted.  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  Biopsies taken.  She also had flexible sigmoidoscopy that showed only a 7 mm sessile polyp in the rectum but poor prep.  On 4/15, and  continued to have significant leukocytosis, mild temperature and abdominal tenderness.  This raised concern about intra-abdominal occult infection especially with the fluid collections noted on CT abdomen.  Infectious disease consulted and started patient on meropenem.  IR consulted and recommended getting repeat CT abdomen and pelvis which showed frank perforation of the descending colon appears to be contained within a portion of the omentum, or occluded celiac axis, slightly decreased pancreatic tail lesion but no previously noted fluid collections.  General surgery and IR consulted.  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 Derrell Lolling on 4/16.  Abdomen left open out of concern for infectious process and ileus. Started on TPN on 07/18/2018.  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 Derrell Lolling, 4/16 Postop ileus -Abdomen remains open -Bowel sounds present but no output in ostomy bag. -IR and general surgery following -VSS on board as well-plan angiogram for SMA stenosis before discharge -Pain control per surgery-PRN morphine, IV Tylenol and Robaxin. -Hemoglobin stable, mild worsening of Leukocytosis -TPN feeding started 4/18.  -Pharmacy to manage electrolyte abnormalities with TPN. -Continue meropenem 4/15--per ID.   For now no end date recommended. -Appreciate PT. Encourage incentive spirometry. -Continue zofran as needed. -Appreciate general surgery assistance.  CT abdomen showed some small pockets of abscess as well as contained air with surgery  thinks is postprocedure.  Does not think the patient actually has a new rupture.  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 -Likely from occluded celiac axis -Continue PPI -Vascular surgery following per above  Significant weight loss/moderate protein calorie malnutrition: Likely due to poor p.o. intake from the above. -Currently awaiting bowel function to feed. -TPN as tolerated -Colon biopsy from the surgery was negative for any malignancy, biopsy from body of stomach were suggestive of intestinal metaplasia but no malignancy.  History of COPD: Stable -Continue DuoNeb as needed   Peripheral vascular disease with dry gangrene: Stable. -Continue home statin -Outpatient vascular surgery follow-up -On therapeutic anticoagulation with IV heparin per pharmacy  Essential hypertension: Fairly controlled. -Continue home meds  Chronic atrial fibrillation: Not in RVR.   Chad2vasc score 6. -On Eliquis and metoprolol at home. -Continued on Heparin and metoprolol -Remains rate controlled  Chronic pain: -Pain medications as above -Continue Lyrica 200 3 times daily, low threshold to reduce the dose if the patient remains sleepy and confused  Tobacco use disorder: -Cessation counseling -Continue Nicotine patch. Stable  DVT prophylaxis: Heparin gtt Code Status: Full Family Communication: Pt in room, family not at bedside, discussed with daughter on phone on 07/23/2018. Disposition Plan: Uncertain at this time  Consultants:   Vascular surgery  Gastroenterology  Infectious disease  Interventional radiology  General surgery  Procedures:   EGD on 4/14  Flex sigmoidoscopy on 4/14  IR drain placement on 4/16 which showed a large left colon defect, 4/16  Ex Lap, LOA, Left sigmoid colectomy, descending colostomy (Hartmann's), Dr. Derrell Lolling, 4/16  Antimicrobials: Anti-infectives (From admission, onward)   Start     Dose/Rate Route Frequency Ordered Stop   07/15/18 1700  meropenem (MERREM) 1 g in sodium chloride 0.9 % 100 mL IVPB     1 g 200 mL/hr over 30  Minutes Intravenous Every 8 hours 07/15/18 1620     07/08/18 2300  vancomycin (VANCOCIN) 1,500 mg in sodium chloride 0.9 % 500 mL IVPB  Status:  Discontinued     1,500 mg 250 mL/hr over 120 Minutes Intravenous  Once 07/07/18 2128 07/07/18 2129   07/08/18 2300  vancomycin (VANCOCIN) 1,500 mg in sodium chloride 0.9 % 500 mL IVPB  Status:  Discontinued     1,500 mg 250 mL/hr over 120 Minutes Intravenous Every 24 hours 07/07/18 2129 07/08/18 0726   07/08/18 0600  ceFEPIme (MAXIPIME) 2 g in sodium chloride 0.9 % 100 mL IVPB  Status:  Discontinued     2 g 200 mL/hr over 30 Minutes Intravenous Every 8 hours 07/07/18 2124 07/13/18 1333   07/07/18 2130  vancomycin (VANCOCIN) 1,500 mg in sodium chloride 0.9 % 500 mL IVPB     1,500 mg 250 mL/hr over 120 Minutes Intravenous  Once 07/07/18 2123 07/08/18 0028   07/07/18 2115  vancomycin (VANCOCIN) IVPB 1000 mg/200 mL premix  Status:  Discontinued     1,000 mg 200 mL/hr over 60 Minutes Intravenous  Once 07/07/18 2110 07/07/18 2128   07/07/18 2115  ceFEPIme (MAXIPIME) 2 g in sodium chloride 0.9 % 100 mL IVPB     2 g 200 mL/hr over 30 Minutes Intravenous  Once 07/07/18 2110 07/07/18 2203      Subjective: More awake.  No acute complaints.  No fever no chills.  Objective: Vitals:   07/24/18 1540 07/24/18 1545 07/24/18 1550 07/24/18 1555  BP:      Pulse: 74 74  74 74  Resp: 15 13 18 17   Temp:      TempSrc:      SpO2: 93% 94% 93% 93%  Weight:      Height:        Intake/Output Summary (Last 24 hours) at 07/24/2018 1903 Last data filed at 07/24/2018 1800 Gross per 24 hour  Intake 2212.97 ml  Output 2210 ml  Net 2.97 ml   Filed Weights   07/22/18 0407 07/23/18 0409 07/24/18 0517  Weight: 64.5 kg 65.8 kg 63.4 kg    Examination:  General exam: Appears calm and comfortable  Respiratory system: Clear to auscultation. Respiratory effort normal. Cardiovascular system: S1 & S2 heard, RRR Gastrointestinal system: Soft, nondistended, mildly tender  Central nervous system: Alert and oriented. No focal neurological deficits. Extremities: Symmetric 5 x 5 power. Skin: No rashes, lesions Psychiatry: Judgement and insight appear normal. Mood & affect appropriate.   Data Reviewed: I have personally reviewed following labs and imaging studies  CBC: Recent Labs  Lab 07/19/18 0432 07/20/18 0536 07/21/18 0541 07/22/18 0643 07/23/18 0548 07/24/18 0347  WBC 26.1* 21.4* 19.6* 18.0* 19.3* 19.3*  NEUTROABS 22.7* 17.2*  --   --   --   --   HGB 7.5* 8.5* 8.3* 8.2* 8.2* 7.9*  HCT 25.7* 28.8* 27.4* 27.1* 26.5* 25.8*  MCV 86.5 83.2 83.5 82.6 82.6 81.1  PLT 283 295 267 296 299 357   Basic Metabolic Panel: Recent Labs  Lab 07/18/18 1036 07/19/18 0432 07/20/18 0536 07/21/18 0541 07/22/18 0643 07/23/18 0548 07/24/18 0347  NA  --  140 135 134* 133* 133* 132*  K  --  3.2* 4.2 4.5 4.3 4.5 4.3  CL  --  109 103 103 103 103 103  CO2  --  28 23 23 23 22 23   GLUCOSE  --  138* 126* 104* 106* 130* 116*  BUN  --  7* 11 13 13 13 12   CREATININE  --  0.40* 0.43* 0.34* 0.35* 0.36* 0.40*  CALCIUM  --  7.7* 8.0* 8.1* 8.0* 8.1* 7.9*  MG 1.6* 1.7 1.5* 1.9  --  1.7 1.9  PHOS 1.9* 1.9* 2.2* 2.9  --  3.1  --    GFR: Estimated Creatinine Clearance: 62.7 mL/min (A) (by C-G formula based on SCr of 0.4 mg/dL (L)). Liver Function Tests: Recent Labs  Lab 07/19/18 0432 07/20/18 0536 07/23/18 0548  AST 71* 46* 69*  ALT 36 32 35  ALKPHOS 105 143* 267*  BILITOT 0.5 0.6 0.6  PROT 4.7* 5.2* 5.1*  ALBUMIN 1.3* 1.3* 1.3*   No results for input(s): LIPASE, AMYLASE in the last 168 hours. No results for input(s): AMMONIA in the last 168 hours. Coagulation Profile: No results for input(s): INR, PROTIME in the last 168 hours. Cardiac Enzymes: No results for input(s): CKTOTAL, CKMB, CKMBINDEX, TROPONINI in the last 168 hours. BNP (last 3 results) No results for input(s): PROBNP in the last 8760 hours. HbA1C: No results for input(s): HGBA1C in the last 72  hours. CBG: Recent Labs  Lab 07/23/18 2358 07/24/18 0518 07/24/18 0801 07/24/18 1150 07/24/18 1614  GLUCAP 112* 106* 110* 136* 106*   Lipid Profile: No results for input(s): CHOL, HDL, LDLCALC, TRIG, CHOLHDL, LDLDIRECT in the last 72 hours. Thyroid Function Tests: No results for input(s): TSH, T4TOTAL, FREET4, T3FREE, THYROIDAB in the last 72 hours. Anemia Panel: No results for input(s): VITAMINB12, FOLATE, FERRITIN, TIBC, IRON, RETICCTPCT in the last 72 hours. Sepsis Labs: No results for input(s): PROCALCITON, LATICACIDVEN in  the last 168 hours.  No results found for this or any previous visit (from the past 240 hour(s)).   Radiology Studies: Ct Abdomen Pelvis W Contrast  Result Date: 07/23/2018 CLINICAL DATA:  Abdominal pain with fever EXAM: CT ABDOMEN AND PELVIS WITH CONTRAST TECHNIQUE: Multidetector CT imaging of the abdomen and pelvis was performed using the standard protocol following bolus administration of intravenous contrast. CONTRAST:  OMNIPAQUE IOHEXOL 300 MG/ML  SOLN COMPARISON:  CT 07/16/2018, 07/15/2018, 07/09/2018 FINDINGS: Lower chest: Development of small left pleural effusion. Dependent atelectasis within the posterior lower lobes. Borderline to mild cardiomegaly. Hepatobiliary: Subcentimeter hypodensity within the liver, no change. Diffuse increased density in the gallbladder lumen possible sludge. No biliary dilatation. Pancreas: No ductal dilatation. Further decrease in size of presumed pseudocyst at the tail of the pancreas, now measuring 19 mm. Mild residual inflammatory changes. Spleen: Similar appearance multifocal hypodensities, possible chronic infarcts. Adrenals/Urinary Tract: Adrenal glands are normal. No hydronephrosis. Scarring within the mid to lower pole of the right kidney. Small focus of air within the urinary bladder. Stomach/Bowel: Focal defect within the gastric wall near the fundal region/anterior proximal body of the stomach, series 6, image  number 45, series 7, image number 91. Fluid and gas collection cephalad to this consistent with perforation. Drainage catheter is noted in the region. Interval partial colectomy with creation of right lower quadrant colostomy. Small fat containing parastomal hernia. Mild edema adjacent to the ostomy loop presumably due to resolving operative change. Blind ending rectosigmoid stump is unremarkable. Vascular/Lymphatic: Extensive aortic atherosclerosis. Chronically occluded proximal celiac artery. SMA is enhancing. Status post aorto femoral bypass with luminal irregularity of the proximal right iliac limb but with flow present. Small residual fluid collection in the right groin, likely postoperative. No significant adenopathy. Reproductive: Status post hysterectomy. 3.6 cm left adnexal cyst, minimal increase in size. Other: Interval large ventral wound. Placement of 2 left abdominal drainage catheters, the more cephalad catheter tip terminates within the upper anterior abdomen. The more inferior drainage catheter tip is visible in the left lower quadrant. Multiple foci of extraluminal gas within the left upper quadrant mesentery, noted to be in the region of previously noted perforated colon. Moderate edema and soft tissue stranding within the mesentery. Small foci of free air adjacent to the liver. Multiple small organizing fluid collections within the left abdomen, largest is seen in the left gutter and measures 2.3 cm. Small organizing fluid collection anterior to the distal body of the pancreas. Small gas and fluid collection along the inferior aspect of the stomach. Musculoskeletal: Postsurgical changes L3 through L5 with posterior rods and fixating screws. Left pedicle screw at L3 breaches the superior endplate. Fat containing mass anterior to the right hip. IMPRESSION: 1. Interval partial colectomy with creation of right lower quadrant colostomy. Placement of left upper and lower quadrant drainage catheters.  Moderate amount of pneumoperitoneum within the left upper quadrant mesentery and adjacent to the liver, suspect that this may be secondary to residual operative air. There is however a focal defect within the fundal/anterior proximal body of the stomach, adjacent to the left upper quadrant drainage catheter which is contiguous with gas and fluid surrounding a portion of the catheter. This is suspected to represent a focal gastric perforation. Moderate inflammatory changes in the left upper quadrant mesentery, likely due to resolving postsurgical change. 2. Multiple small organizing fluid collections within the left mid and upper abdomen, suspect to represent small intra-abdominal abscesses. The largest is seen in the left gutter and measures  2.3 cm. 3. Development of small left pleural effusion with basilar atelectasis. 4. Other chronic findings as previously reported. Decreased size of presumed pseudocyst in the pancreatic tail. Critical Value/emergent results were called by telephone at the time of interpretation on 07/23/2018 at 8:06 pm to Dr. Luisa Hart, who verbally acknowledged these results. Electronically Signed   By: Jasmine Pang M.D.   On: 07/23/2018 20:06    Scheduled Meds: . atorvastatin  40 mg Oral Daily  . Chlorhexidine Gluconate Cloth  6 each Topical Q0600  . feeding supplement (ENSURE ENLIVE)  237 mL Oral TID BM  . insulin aspart  0-9 Units Subcutaneous Q4H  . lidocaine  1 patch Transdermal Q24H  . metoprolol tartrate  75 mg Oral BID  . nicotine  21 mg Transdermal Daily  . pantoprazole (PROTONIX) IV  40 mg Intravenous Q24H  . pregabalin  200 mg Oral TID  . saccharomyces boulardii  250 mg Oral BID  . sodium chloride flush  10-40 mL Intracatheter Q12H   Continuous Infusions: . sodium chloride 10 mL/hr at 07/22/18 2300  . heparin 1,350 Units/hr (07/24/18 1540)  . meropenem (MERREM) IV 1 g (07/24/18 1438)  . methocarbamol (ROBAXIN) IV 500 mg (07/24/18 1338)  . TPN ADULT (ION) 83 mL/hr  at 07/24/18 1728     LOS: 17 days   Lynden Oxford, MD [facility] Pager On Amion  If 7PM-7AM, please contact night-coverage 07/24/2018, 7:03 PM

## 2018-07-24 NOTE — Progress Notes (Addendum)
Physical Therapy Treatment Patient Details Name: [name] MRN: [id number] DOB: 13-Jun-1945 Today's Date: 07/24/2018    History of Present Illness  [name] is a 73 y.o. female with medical history significant of chronic back pain, GERD, hyperlipidemia, hypertension, left knee osteoarthritis, palpitations, PVD h/o Aortofem bypass, s/p RLE thromboembolectomy and R common emoral artery patch angioplasty 05/31/18, history of tobacco use who presents to ER due to fall at home.  patient now s/p EXPLORATORY LAPAROTOMY, lysis of adhesions X 45 minutes LEFT SIGMOID COLECTOMY, SPLENIC FLEXOR MOBILIZATION,DESCENDING COLOSTOMY on 4/16.    PT Comments    Pt admitted with above diagnosis. Pt currently with functional limitations due to balance and endurance deficits. Pt was able to ambulate but would not incr distance.  Pt would not perform exercises either as she states, "You can't make me do them."  Pt not in a good mood today.  4/4 goals unmet due to slow progression.  REvised goals. Will continue to progress as able.   Pt will benefit from skilled PT to increase their independence and safety with mobility to allow discharge to the venue listed below.    Follow Up Recommendations  SNF(HHPT if progresses well )     Equipment Recommendations  Other (comment)(TBD )    Recommendations for Other Services       Precautions / Restrictions Precautions Precautions: Fall Restrictions Weight Bearing Restrictions: No    Mobility  Bed Mobility Overal bed mobility: Needs Assistance Bed Mobility: Supine to Sit     Supine to sit: Min guard;Min assist     General bed mobility comments: Pt was able to come to EOB with cues with a little assist for elevation of trunk.   Transfers Overall transfer level: Needs assistance Equipment used: Rolling walker (2 wheeled) Transfers: Sit to/from Stand Sit to Stand: Min guard;Min assist Stand pivot transfers: Min assist       General transfer  comment: no assist needed for standing, but steadying assist once up.  Ambulation/Gait Ambulation/Gait assistance: Min assist;Min guard Gait Distance (Feet): 40 Feet Assistive device: Rolling walker (2 wheeled) Gait Pattern/deviations: Step-through pattern;Decreased stride length;Drifts right/left;Trunk flexed Gait velocity: slow Gait velocity interpretation: <1.31 ft/sec, indicative of household ambulator General Gait Details: short guarded steps but did progress ambulation today. Refuse3d to walk further than just out door with max encouragment.    Stairs             Wheelchair Mobility    Modified Rankin (Stroke Patients Only)       Balance Overall balance assessment: Needs assistance Sitting-balance support: Feet supported;Bilateral upper extremity supported Sitting balance-Leahy Scale: Poor Sitting balance - Comments: needed UE support for balance   Standing balance support: During functional activity;Bilateral upper extremity supported Standing balance-Leahy Scale: Poor Standing balance comment: mildly unsteady, needed assist to be steady in standing.                            Cognition Arousal/Alertness: Awake/alert Behavior During Therapy: WFL for tasks assessed/performed Overall Cognitive Status: Impaired/Different from baseline                                        Exercises General Exercises - Lower Extremity Ankle Circles/Pumps: AROM;10 reps;Supine;Both Heel Slides: AROM;Both;10 reps;Supine    General Comments General comments (skin integrity, edema, etc.): VSS  Pertinent Vitals/Pain Pain Assessment: Faces Faces Pain Scale: Hurts whole lot Pain Location: stomach Pain Descriptors / Indicators: Burning;Discomfort Pain Intervention(s): Limited activity within patient's tolerance;Monitored during session;Repositioned    Home Living                      Prior Function            PT Goals (current  goals can now be found in the care plan section) Acute Rehab PT Goals Patient Stated Goal: return home with family to assist PT Goal Formulation: With patient Time For Goal Achievement: 08/07/18 Potential to Achieve Goals: Good Progress towards PT goals: Progressing toward goals    Frequency    Min 2X/week      PT Plan Current plan remains appropriate;Frequency needs to be updated    Co-evaluation              AM-PAC PT "6 Clicks" Mobility   Outcome Measure  Help needed turning from your back to your side while in a flat bed without using bedrails?: A Little Help needed moving from lying on your back to sitting on the side of a flat bed without using bedrails?: A Little Help needed moving to and from a bed to a chair (including a wheelchair)?: A Little Help needed standing up from a chair using your arms (e.g., wheelchair or bedside chair)?: A Little Help needed to walk in hospital room?: A Little Help needed climbing 3-5 steps with a railing? : A Lot 6 Click Score: 17    End of Session Equipment Utilized During Treatment: Gait belt Activity Tolerance: Patient limited by pain;Patient limited by fatigue Patient left: in bed;with call bell/phone within reach(refused to sit in chair) Nurse Communication: Mobility status PT Visit Diagnosis: Unsteadiness on feet (R26.81);Other abnormalities of gait and mobility (R26.89);Difficulty in walking, not elsewhere classified (R26.2)     Time: 1110-1131 PT Time Calculation (min) (ACUTE ONLY): 21 min  Charges:  $Gait Training: 8-22 mins                     Malloree Raboin,PT Acute Rehabilitation Services Pager:  [phone]  Office:  [phone]     Berline Lopes 07/24/2018, 12:00 PM

## 2018-07-24 NOTE — Care Management Important Message (Signed)
Important Message  Patient Details  Name: [name] MRN: [id number] Date of Birth: 10-24-1945   Medicare Important Message Given:  Yes    Dorena Bodo 07/24/2018, 10:29 AM

## 2018-07-24 NOTE — Progress Notes (Addendum)
8 Days Post-Op    MI:[id number] colon  Subjective: She was up to the side of the bed and said she felt better. This is the first time I have seen her up this week.  Lungs less congested.  Still almost almost nothing coming out of the colostomy.  What was there today looked a little more cloudy; like it was from the colon and not just sweat.   Her abdomen is soft and the not distended.  She has pain when I look at the wound, but moved pretty well in bed this AM.  The base of the wound continues to look like it is breaking down with white fibrous tissue at the base, the sides of the wound look much better. Wound looks a lot cleaner than it did on Monday.   No complaints of nausea or vomiting but she is taking very little PO  Objective: Vital signs in last 24 hours: Temp:  [98.4 F (36.9 C)-99 F (37.2 C)] 98.6 F (37 C) (04/24 0842) Pulse Rate:  [70-80] 74 (04/24 0517) Resp:  [16-22] 17 (04/24 0842) BP: (129-155)/(59-65) 129/59 (04/24 0842) SpO2:  [95 %-98 %] 98 % (04/24 0517) Weight:  [63.4 kg] 63.4 kg (04/24 0517) Last BM Date: 07/22/18 270 PO  2800 IV 4200 urine 20 from drain 1 left medial/5 from drain 2 LLQ No stool recorded Afebrile, VSS WBC still 19.3  H/H 7.9/25.8   CT abd/pelvis 07/22/18: reviewed by DR. Cornett: shows a partial colectomy with right lower quadrant colostomy;  left upper and lower quadrant drainage catheters.  Moderate amount of pneumoperitoneum in the left upper quadrant mesentery adjacent to the liver, thought to be residual operative air.   There is some focal defect within the fundal/anterior proximal body of the stomach, adjacent to the left upper quadrant drainage catheter which is  contiguous with gas and fluid surrounding portion of the catheter.  Radiology thought this represented a possible focal gastric perforation.  Dr. Luisa Hart reviewed this and it was his opinion this was not consistent with a perforation.  There were multiple organizing fluid  collections within the left mid and upper abdomen with a suspicion of abdominal abscess the largest seen was noted at 2.3 cm. 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   Intake/Output from previous day: 04/23 0701 - 04/24 0700 In: 3109.3 [P.O.:270; I.V.:2272.4; IV Piggyback:567] Out: 4225 [Urine:4200; Drains:25] Intake/Output this shift: Total I/O In: -  Out: 800 [Urine:800]  General appearance: alert, cooperative and no distress Resp: clear to auscultation bilaterally and moving air much better after being up in the bed when I got to the room.   GI: soft, not really tender unless I work on her wound.  The two drains are the same, one drain has the cloundy fluid,but not very much, the second is clear serous fluid.  Few BS, the ostomy is a dark pink color, still a little edematous, Sides of the wound are fine, the base as noted above.    Lab Results:  Recent Labs    07/23/18 0548 07/24/18 0347  WBC 19.3* 19.3*  HGB 8.2* 7.9*  HCT 26.5* 25.8*  PLT 299 357    BMET Recent Labs    07/23/18 0548 07/24/18 0347  NA 133* 132*  K 4.5 4.3  CL 103 103  CO2 22 23  GLUCOSE 130* 116*  BUN 13 12  CREATININE 0.36* 0.40*  CALCIUM 8.1* 7.9*  PT/INR No results for input(s): LABPROT, INR in the last 72 hours.  Recent Labs  Lab 07/19/18 0432 07/20/18 0536 07/23/18 0548  AST 71* 46* 69*  ALT 36 32 35  ALKPHOS 105 143* 267*  BILITOT 0.5 0.6 0.6  PROT 4.7* 5.2* 5.1*  ALBUMIN 1.3* 1.3* 1.3*     Lipase     Component Value Date/Time   LIPASE 65 (H) 07/08/2018 1325     Medications: . atorvastatin  40 mg Oral Daily  . Chlorhexidine Gluconate Cloth  6 each Topical Q0600  . feeding supplement  1 Container Oral BID BM  . insulin aspart  0-9 Units Subcutaneous Q4H  . lidocaine  1 patch Transdermal Q24H  . metoprolol tartrate  75 mg Oral BID  . nicotine  21 mg Transdermal  Daily  . nutrition supplement (JUVEN)  1 packet Oral BID BM  . pantoprazole (PROTONIX) IV  40 mg Intravenous Q24H  . pregabalin  200 mg Oral TID  . sodium chloride flush  10-40 mL Intracatheter Q12H   . sodium chloride 10 mL/hr at 07/22/18 2300  . heparin 1,450 Units/hr (07/24/18 0525)  . magnesium sulfate 1 - 4 g bolus IVPB    . meropenem (MERREM) IV 1 g (07/24/18 0600)  . methocarbamol (ROBAXIN) IV 500 mg (07/24/18 0527)  . TPN ADULT (ION) 60 mL/hr at 07/23/18 1805    Assessment/Plan  A. 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 Derrell Lollingamirez, 4/16POD #8 - TID WTD dressing changes  Expectedpost-op ileus. No bowel function throughout colostomy. NPO - Patient pulled NG tube overnight04/17. Re-insert if develops N/V or abdominal distension. - Mobilize as tolerated. IS/pulm toliet.PT evaluation yesterday SNF versus home health with PT pending progress.  FEN-clears from the floor/nutrition consult,IVF,TPN (4/19) VTE -SCDs, heparindrip ID -Meropenem 4/15 >> per ID,day10;WBC26.1>>21.4,>>19.6(4/21),afebrile Foley - Removed POD 1 Follow up -Dr. Derrell Lollingamirez POC: Dellis Filbertammy Kallam [phone] daughter/[name] [phone]   Talked to [name] for 10 minutes  Plan:  I am not sure she is ready for full liquids, but she is taking in so little I don't think it will hurt to try.  Continue Meropenem, add probiotics.  Vascular plans to Angiogram next week.   Increase  dressing changes.    LOS: 17 days    Xzavier Swinger 07/24/2018 [phone]

## 2018-07-24 NOTE — Plan of Care (Signed)
  Problem: Health Behavior/Discharge Planning: Goal: Ability to manage health-related needs will improve Outcome: Progressing   Problem: Clinical Measurements: Goal: Will remain free from infection Outcome: Progressing   Problem: Activity: Goal: Risk for activity intolerance will decrease Outcome: Not Progressing   Problem: Nutrition: Goal: Adequate nutrition will be maintained Outcome: Not Progressing

## 2018-07-24 NOTE — Progress Notes (Signed)
[facility]   Reason for visit: Follow up on ischemic colitis/bowel perforation  Antibiotic Days:10 Meropenem, day10 TPN, day7   Interval History: CT imaging last PM suggestive of ongoing gastric perforation with 2 additional abdominal abscesses. Pt tolerated full liquid diet with only one episode of emesis today. Ostomy still has no output as of post-op day 8. TPN continuing. Pt did ambulate some in room with PT today finally.  Fever curves, ABX, WBC trends, and imaging all independently reviewed   Physical Exam: Constitutional: chronically ill-appearing, mild distress secondary to abdominal pain, A&Ox 3, withdrawn Vitals:   07/24/18 1550 07/24/18 1555  BP:    Pulse: 74 74  Resp: 18 17  Temp:    SpO2: 93% 93%  Eyes: anicteric, PERRL, EOMI HENT: no thrush, +bitemporal wasting, PO clear, MMM Respiratory: Normal respiratory effort; CTA B Cardiovascular:NRRR, no obvious cardiac murmurs evident GI:+colostomy with minimal liquid output (<10 cc), TTP overlying midline incision, hypoactive BS, 2 LLQ JP drains with serous output Extrems: trace LE edema, 2+ pulses MSK: muscular atrophy noted, normal muscle tone Skin: no rashes, poor skin turgor Neuro: CN II-XII intact, no focal neurologic deficits Pysch: flat affect, withdrawn, depressed mood  Review of Systems: +anorexia, abdominal pain near incision and JP drains, no F/C, emesis x 1 today, no SOB/CP. All other systems were reviewed and negative except as noted above.  Lab Results  Component Value Date   WBC 19.3 (H) 07/24/2018   HGB 7.9 (L) 07/24/2018   HCT 25.8 (L) 07/24/2018   MCV 81.1 07/24/2018   PLT 357 07/24/2018    Lab Results  Component Value Date   CREATININE 0.40 (L) 07/24/2018   BUN 12 07/24/2018   NA 132 (L) 07/24/2018   K 4.3 07/24/2018   CL 103 07/24/2018   CO2 23 07/24/2018    Lab Results  Component Value Date   ALT 35 07/23/2018   AST 69 (H) 07/23/2018   ALKPHOS 267  (H) 07/23/2018     Microbiology: No results found for this or any previous visit (from the past 240 hour(s)).   CT abdomen/pelvis with IV contrast only on 07/23/2018 IMPRESSION: 1. Interval partial colectomy with creation of right lower quadrant colostomy. Placement of left upper and lower quadrant drainage catheters. Moderate amount of pneumoperitoneum within the left upper quadrant mesentery and adjacent to the liver, suspect that this may be secondary to residual operative air. There is however a focal defect within the fundal/anterior proximal body of the stomach, adjacent to the left upper quadrant drainage catheter which is contiguous with gas and fluid surrounding a portion of the catheter. This is suspected to represent a focal gastric perforation. Moderate inflammatory changes in the left upper quadrant mesentery, likely due to resolving postsurgical change. 2. Multiple small organizing fluid collections within the left mid and upper abdomen, suspect to represent small intra-abdominal abscesses. The largest is seen in the left gutter and measures 2.3 cm. 3. Development of small left pleural effusion with basilar atelectasis. 4. Other chronic findings as previously reported. Decreased size of presumed pseudocyst in the pancreatic tail.  Critical Value/emergent results were called by telephone at the time of interpretation on 07/23/2018 at 8:06 pm to Dr. Luisa Hartornett, who verbally acknowledged these results.  Impression/Plan: The patient is a 73 y/o WF with severe PVD, s/p recent aorto-bifem bypass graft in 05/2018, chronic dry gangrene of toes admitted on 07/07/2018 with leukocytosis and fever, ultimately found to have ischemic colitis and bowel perforation,  s/p resection and creation of diverting colostomy.  1. Ischemic colitis/secondary peritonitis -patient had initial elevation her lactic acid and classic symptoms of intestinal ischemia.  She was taken to the operating room on  April 15 for an exploratory laparotomy and bowel resection she was found to have a bowel perforation at the time and diverting colostomy was also placed.  Since that time the patient has not had output via her colostomy.  No GI cathartics have been given to my knowledge.  Unfortunately, no operative cultures were obtained at the time of her surgery, so empiric meropenem has been given since that time.  She was started on TPN last Saturday and given bowel rest for several days.  Yesterday she was started on clear liquids but had emesis x2 and still no output through her ostomy.  As her white blood cell count was increasing, repeat CT imaging was performed; however, oral contrast was not given.  The radiologist initially read her repeat CT scan as a possible gastric perforation with a 2 new small intra-abdominal abscesses.  While these abscesses are small have to be treated medically, I have concern that this has arisen in the setting of broad-spectrum antibiotic coverage with meropenem.  Cultures are entirely needed in this patient to guide further therapy and avoid overly broad antibiotics that would lead to harm later.  Furthermore, I am skeptical that the patient does not truly have a perforation despite our surgeons reduction of the patient's CT scan.  She now has a diverting colostomy and an otherwise intact gut, she should not be developing recurrent abscesses following her bowel resection unless she has further ischemic bowel.  If the patient's white blood cell count continues to elevate, at minimum I would repeat a contrasted CT scan of the abdomen with oral and IV contrast to better evaluate for possible perforation.  If this scan were deferred an operative intervention should be pursued instead if she clinically deteriorates or her white blood cell count elevates.  It is also unusual that the patient has had no GI transit since the time of her surgery as well this becomes more concerning with age passing  day.  We will extend her meropenem for a total of 4 weeks empirically given her new abscesses noted on exam.  2. Leukocytosis -this issue had been declining slowly following the patient's most recent surgery.  However, in the last 2 days patient has had an increase in her white blood cell count despite broad-spectrum antibiotics.  Repeat CT imaging is suggestive of 2 new intra-abdominal abscesses and a possible perforation.  This is concerning given that this is happening in the setting of broad-spectrum antibiotics and no ostomy output for over a week.  Check a CBC with differential daily to trend the patient's white blood cell count and continue until the patient's white blood cell count has returned to normal.  3. F/E/N - patient is now been on TPN for the past 7 days.  The past 2 of which while she is been attempting reinitiation of enteral diet.  Clinical inertia poses a risk for the patient to develop fungemia while she is on TPN and broad-spectrum antibiotics simultaneously.  Please make efforts to DC the patient's TPN in the next 1 to 2 days as this may actually be impeding efforts to proceed with enteral nutrition and would also consider giving the patient GI cathartics to hasten ostomy output.  If mesenteric ischemia is still a concern, I would check a lactic acid as  well.

## 2018-07-24 NOTE — Progress Notes (Signed)
ANTICOAGULATION CONSULT NOTE - Follow Up Consult  Pharmacy Consult for Heparin Indication: atrial fibrillation  No Known Allergies  Patient Measurements: Height: 5\' 7"  (170.2 cm) Weight: 139 lb 12.4 oz (63.4 kg) IBW/kg (Calculated) : 61.6 Heparin Dosing Weight: 65.8 kg  Vital Signs: Temp: 98.6 F (37 C) (04/24 0842) Temp Source: Oral (04/24 0842) BP: 129/59 (04/24 0842) Pulse Rate: 74 (04/24 0517)  Labs: Recent Labs    07/22/18 0643  07/23/18 0548 07/24/18 0347 07/24/18 1216  HGB 8.2*  --  8.2* 7.9*  --   HCT 27.1*  --  26.5* 25.8*  --   PLT 296  --  299 357  --   HEPARINUNFRC 0.25*   < > 0.66 0.88* 0.84*  CREATININE 0.35*  --  0.36* 0.40*  --    < > = values in this interval not displayed.    Estimated Creatinine Clearance: 62.7 mL/min (A) (by C-G formula based on SCr of 0.4 mg/dL (L)).  Assessment:   Anticoag: Eliquis PTA>Heparin. New afib and not clear when the DOAC started. Was on PTA but not at the last clinic visit. S/p GI procedure and left abd abscess drain placement 4/16. Heparin resumed 4/17. - Heparin level remains supra-therapeutic at 0.84. Hgb and plts low but relatively stable. No bleeding noted per RN.  Goal of Therapy:  Heparin level 0.3-0.7 units/ml Monitor platelets by anticoagulation protocol: Yes   Plan:  Decrease heparin drip to 1350 units/hr Check 8 hour heparin level Monitor daily heparin level and CBC, s/sx bleeding    Jenetta Downer, [facility] Clinical Pharmacist Phone [phone]  07/24/2018 1:21 PM

## 2018-07-24 NOTE — Progress Notes (Signed)
[facility] [place]  Subjective  - No new abdominal complaints.   Objective (!) 135/59 74 99 F (37.2 C) (Oral) 16 98%  Intake/Output Summary (Last 24 hours) at 07/24/2018 0832 Last data filed at 07/24/2018 0746 Gross per 24 hour  Intake 3109.3 ml  Output 4325 ml  Net -1215.7 ml    Abd: soft, no rebound or guarding  Laboratory Lab Results: Recent Labs    07/23/18 0548 07/24/18 0347  WBC 19.3* 19.3*  HGB 8.2* 7.9*  HCT 26.5* 25.8*  PLT 299 357   BMET Recent Labs    07/23/18 0548 07/24/18 0347  NA 133* 132*  K 4.5 4.3  CL 103 103  CO2 22 23  GLUCOSE 130* 116*  BUN 13 12  CREATININE 0.36* 0.40*  CALCIUM 8.1* 7.9*    COAG Lab Results  Component Value Date   INR 1.3 (H) 07/10/2018   INR 1.7 (H) 07/07/2018   INR 1.05 05/17/2009   No results found for: PTT  Assessment/Planning:  Tentative plan for mesenteric angiogram prior to discharge.  Probably next week unless ready for discharge beforehand and can be arranged as outpatient.  Cephus Shelling 07/24/2018 8:32 AM --

## 2018-07-24 NOTE — Progress Notes (Signed)
Abdominal dressing change done per MD order. Patient tolerated well.   Ernestina Columbia, RN

## 2018-07-25 LAB — GLUCOSE, CAPILLARY
Glucose-Capillary: 113 mg/dL — ABNORMAL HIGH (ref 70–99)
Glucose-Capillary: 115 mg/dL — ABNORMAL HIGH (ref 70–99)
Glucose-Capillary: 119 mg/dL — ABNORMAL HIGH (ref 70–99)
Glucose-Capillary: 120 mg/dL — ABNORMAL HIGH (ref 70–99)
Glucose-Capillary: 122 mg/dL — ABNORMAL HIGH (ref 70–99)
Glucose-Capillary: 138 mg/dL — ABNORMAL HIGH (ref 70–99)

## 2018-07-25 LAB — CBC
HCT: 25.4 % — ABNORMAL LOW (ref 36.0–46.0)
Hemoglobin: 7.7 g/dL — ABNORMAL LOW (ref 12.0–15.0)
MCH: 24.6 pg — ABNORMAL LOW (ref 26.0–34.0)
MCHC: 30.3 g/dL (ref 30.0–36.0)
MCV: 81.2 fL (ref 80.0–100.0)
Platelets: 319 10*3/uL (ref 150–400)
RBC: 3.13 MIL/uL — ABNORMAL LOW (ref 3.87–5.11)
RDW: 18.6 % — ABNORMAL HIGH (ref 11.5–15.5)
WBC: 17.6 10*3/uL — ABNORMAL HIGH (ref 4.0–10.5)
nRBC: 1.1 % — ABNORMAL HIGH (ref 0.0–0.2)

## 2018-07-25 LAB — HEPARIN LEVEL (UNFRACTIONATED): Heparin Unfractionated: 0.43 IU/mL (ref 0.30–0.70)

## 2018-07-25 MED ORDER — TRAVASOL 10 % IV SOLN
INTRAVENOUS | Status: AC
  Administered 2018-07-25: 17:00:00 via INTRAVENOUS
  Filled 2018-07-25: qty 864

## 2018-07-25 NOTE — Progress Notes (Signed)
Mrs. Hurney pulled her right peripheral IV out at approximately 1330.  Restarted heparin in the right upper arm PICC line.

## 2018-07-25 NOTE — Progress Notes (Signed)
Progress Note: General Surgery Service   Assessment/Plan: Principal Problem:   Lower abdominal pain Active Problems:   Hyperlipidemia with target LDL less than 100   TOBACCO ABUSE   Essential hypertension   Peripheral vascular disease (HCC)   Sepsis (HCC)   Severe protein-calorie malnutrition (HCC)   Abnormal liver function   Loss of weight   Nausea without vomiting   Abnormal CT scan, colon   Bowel perforation (HCC)  s/p Procedure(s): EXPLORATORY LAPAROTOMY, LEFT SIGMOID COLECTOMY,, SPLENIC FLEXOR, COLOSTOMY 07/16/2018  Suspected chronic mesenteric ischemia Descending colon perforation on CT - s/p IR drain placement on 4/16 which showed a large left colon defect, 4/16 - s/p Ex Lap, LOA, Left sigmoid colectomy, descending colostomy (Hartmann's), Dr. Derrell Lollingamirez, 4/16POD #8 - TID WTD dressing changes  Expectedpost-op ileus.  - Mobilize as tolerated. IS/pulm toliet.PT evaluation yesterday SNF versus home health with PT pending progress.  FEN-soft diet/ensure supplement/nutrition consult,IVF,TPN (4/19)  -can begin TPN wean VTE -SCDs, heparindrip ID -Meropenem 4/15 >>per ID,day10;WBC26.1>>21.4,>>19.6(4/21),afebrile Foley - Removed POD 1 Follow up -Dr. Derrell Lollingamirez   LOS: 18 days  Chief Complaint/Subjective: Continued abdominal discomfort, colostomy bag leaking and changes this morning  Objective: Vital signs in last 24 hours: Temp:  [98 F (36.7 C)-98.6 F (37 C)] 98.4 F (36.9 C) (04/25 0600) Pulse Rate:  [71-83] 79 (04/25 0600) Resp:  [13-22] 19 (04/25 0600) BP: (129-150)/(56-87) 150/58 (04/25 0600) SpO2:  [93 %-96 %] 96 % (04/25 0600) Weight:  [63.4 kg] 63.4 kg (04/25 0605) Last BM Date: 07/22/18  Intake/Output from previous day: 04/24 0701 - 04/25 0700 In: 3440.3 [P.O.:320; I.V.:2513.5; IV Piggyback:586.8] Out: 2325 [Urine:2325] Intake/Output this shift: No intake/output data recorded.  Lungs: nonlabored  Cardiovascular: RRR  Abd:  soft, open incision with gray fibrinous base, ostomy slightly pale, no gas or fluid in bag  Extremities: no edema  Neuro: AOx2  Lab Results: CBC  Recent Labs    07/24/18 0347 07/25/18 0601  WBC 19.3* 17.6*  HGB 7.9* 7.7*  HCT 25.8* 25.4*  PLT 357 319   BMET Recent Labs    07/23/18 0548 07/24/18 0347  NA 133* 132*  K 4.5 4.3  CL 103 103  CO2 22 23  GLUCOSE 130* 116*  BUN 13 12  CREATININE 0.36* 0.40*  CALCIUM 8.1* 7.9*   PT/INR No results for input(s): LABPROT, INR in the last 72 hours. ABG No results for input(s): PHART, HCO3 in the last 72 hours.  Invalid input(s): PCO2, PO2  Studies/Results:  Anti-infectives: Anti-infectives (From admission, onward)   Start     Dose/Rate Route Frequency Ordered Stop   07/15/18 1700  meropenem (MERREM) 1 g in sodium chloride 0.9 % 100 mL IVPB     1 g 200 mL/hr over 30 Minutes Intravenous Every 8 hours 07/15/18 1620     07/08/18 2300  vancomycin (VANCOCIN) 1,500 mg in sodium chloride 0.9 % 500 mL IVPB  Status:  Discontinued     1,500 mg 250 mL/hr over 120 Minutes Intravenous  Once 07/07/18 2128 07/07/18 2129   07/08/18 2300  vancomycin (VANCOCIN) 1,500 mg in sodium chloride 0.9 % 500 mL IVPB  Status:  Discontinued     1,500 mg 250 mL/hr over 120 Minutes Intravenous Every 24 hours 07/07/18 2129 07/08/18 0726   07/08/18 0600  ceFEPIme (MAXIPIME) 2 g in sodium chloride 0.9 % 100 mL IVPB  Status:  Discontinued     2 g 200 mL/hr over 30 Minutes Intravenous Every 8 hours 07/07/18 2124 07/13/18 1333  07/07/18 2130  vancomycin (VANCOCIN) 1,500 mg in sodium chloride 0.9 % 500 mL IVPB     1,500 mg 250 mL/hr over 120 Minutes Intravenous  Once 07/07/18 2123 07/08/18 0028   07/07/18 2115  vancomycin (VANCOCIN) IVPB 1000 mg/200 mL premix  Status:  Discontinued     1,000 mg 200 mL/hr over 60 Minutes Intravenous  Once 07/07/18 2110 07/07/18 2128   07/07/18 2115  ceFEPIme (MAXIPIME) 2 g in sodium chloride 0.9 % 100 mL IVPB     2 g 200  mL/hr over 30 Minutes Intravenous  Once 07/07/18 2110 07/07/18 2203      Medications: Scheduled Meds: . atorvastatin  40 mg Oral Daily  . Chlorhexidine Gluconate Cloth  6 each Topical Q0600  . feeding supplement (ENSURE ENLIVE)  237 mL Oral TID BM  . insulin aspart  0-9 Units Subcutaneous Q4H  . lidocaine  1 patch Transdermal Q24H  . metoprolol tartrate  75 mg Oral BID  . nicotine  21 mg Transdermal Daily  . pantoprazole (PROTONIX) IV  40 mg Intravenous Q24H  . pregabalin  200 mg Oral TID  . saccharomyces boulardii  250 mg Oral BID  . sodium chloride flush  10-40 mL Intracatheter Q12H   Continuous Infusions: . sodium chloride 10 mL/hr at 07/22/18 2300  . heparin 1,200 Units/hr (07/24/18 2338)  . meropenem (MERREM) IV 1 g (07/25/18 0540)  . methocarbamol (ROBAXIN) IV 500 mg (07/25/18 0506)  . TPN ADULT (ION) 83 mL/hr at 07/24/18 1728   PRN Meds:.sodium chloride, alum & mag hydroxide-simeth, hydrALAZINE, ipratropium-albuterol, labetalol, morphine injection, oxyCODONE, phenol, promethazine, sodium chloride flush  Rodman Pickle, MD [facility]

## 2018-07-25 NOTE — Progress Notes (Signed)
[facility] [place]  Subjective  - No new abdominal complaints.   Objective (!) 154/71 74 98.6 F (37 C) (Oral) 19 95%  Intake/Output Summary (Last 24 hours) at 07/25/2018 0906 Last data filed at 07/25/2018 0800 Gross per 24 hour  Intake 3634.68 ml  Output 2325 ml  Net 1309.68 ml    Abd: soft, no rebound or guarding Weakly palpable left DP pulse, left great and 2nd toes with dry gangrene  Laboratory Lab Results: Recent Labs    07/24/18 0347 07/25/18 0601  WBC 19.3* 17.6*  HGB 7.9* 7.7*  HCT 25.8* 25.4*  PLT 357 319   BMET Recent Labs    07/23/18 0548 07/24/18 0347  NA 133* 132*  K 4.5 4.3  CL 103 103  CO2 22 23  GLUCOSE 130* 116*  BUN 13 12  CREATININE 0.36* 0.40*  CALCIUM 8.1* 7.9*    COAG Lab Results  Component Value Date   INR 1.3 (H) 07/10/2018   INR 1.7 (H) 07/07/2018   INR 1.05 05/17/2009   No results found for: PTT  Assessment/Planning:  Tentative plan for mesenteric angiogram prior to discharge.  Probably next week.  Nurse also pointed out today patient now has dry gangrene on left 1st and 2nd toes in setting of weakly palpable left DP pulse.  Patient states this has been present for several weeks.  We can likely evaluate runoff in left lower extremity during mesenteric angiogram as well.    Cephus Shelling 07/25/2018 9:06 AM --

## 2018-07-25 NOTE — Plan of Care (Signed)
Care plans reviewed and patient is progressing.  

## 2018-07-25 NOTE — Progress Notes (Signed)
PROGRESS NOTE    Danielle FilbertLinda S Grant  WUX:[id number] DOB: 1946/01/09 DOA: 07/07/2018 PCP: [name], [name], [name]    Brief Narrative:  73 year old female with history of chronic N/V/abdominal pain, A. fib, PVD status post right femoral thrombectomy on 05/31/2018, hypertension and chronic back pain presenting with intractable nausea, vomiting, abdominal pain, new onset of diarrhea and about 30 pounds weight loss in the last several months.  [facility]  on 07/07/2018 with somnolence and a diagnosis of sepsis. She has had a prior aortobifem by Dr Hart RochesterLawson in 2002 and had thrombectomy of the right limb of this by Dr Randie Heinzain 3/20. 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 Her left internal iliac artery was patent at that point.   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 Also there is a 3.9 x 1.8 cm and 2.6 x 1.1 cm fluid collection in right pelvis/groin thought to be seroma from prior surgery.   Per H&P, [facility] and consideration was being given for stenting her SMA which has about a (30-50% stenosis) to improve mesenteric blood flow.   [facility].  Patient was also treated with cefepime for presumed pneumonia.  Gastroenterology and vascular surgery consulted.  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  Biopsies taken.  She also had flexible sigmoidoscopy that showed only a 7 mm sessile polyp in the rectum but poor prep.  On 4/15, and  continued to have significant leukocytosis, mild temperature and abdominal tenderness.  This raised concern about intra-abdominal occult infection especially with the fluid collections noted on CT abdomen.  Infectious disease consulted and started patient on meropenem.  IR consulted and recommended getting repeat CT abdomen and pelvis which showed frank perforation of the descending colon appears to be contained within a portion of the omentum, or occluded celiac axis, slightly decreased pancreatic tail lesion but no previously noted fluid collections.  General surgery and IR consulted.  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 Derrell Lollingamirez on 4/16.  Abdomen left open out of concern for infectious process and ileus. Started on TPN on 07/18/2018.  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 Derrell Lollingamirez, 4/16 Postop ileus -Abdomen remains open -Bowel sounds present but no output in ostomy bag. -IR and general surgery following -VSS on board as well-plan angiogram for SMA stenosis before discharge -Pain control per surgery-PRN morphine, IV Tylenol and Robaxin. -Hemoglobin stable, mild worsening of Leukocytosis -TPN feeding started 4/18. Appreciate surgical assistance in weaning the TPN. -Pharmacy to manage electrolyte abnormalities with TPN. -Continue meropenem 4/15--per ID.   For now no end date recommended. -Appreciate PT. Encourage incentive spirometry. -Continue zofran as needed. -Appreciate general surgery assistance.  CT abdomen showed some small pockets of abscess as  well as contained air with surgery thinks is postprocedure.  Does not think the patient actually has a new rupture.  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 -Likely from occluded celiac axis -Continue PPI -Vascular surgery following per above  Significant weight loss/moderate protein calorie malnutrition: Likely due to poor p.o. intake from the above. -Currently awaiting bowel function to feed. -TPN as tolerated -Colon biopsy from the surgery was negative for any malignancy, biopsy from body of stomach were suggestive of intestinal metaplasia but no malignancy.  History of COPD: Stable -Continue DuoNeb as needed   Peripheral vascular disease with dry gangrene: Stable. -Continue home statin -Outpatient vascular surgery follow-up -On therapeutic anticoagulation with IV heparin per pharmacy  Essential hypertension: Fairly controlled. -Continue home meds  Chronic atrial fibrillation: Not in RVR.   Chad2vasc score 6. -On Eliquis and metoprolol at home. -Continued on Heparin and metoprolol -Remains rate controlled  Chronic pain: -Pain medications as above -Continue Lyrica 200 3 times daily, low threshold to reduce the dose if the patient remains sleepy and confused  Tobacco use disorder: -Cessation counseling -Continue Nicotine patch. Stable  DVT prophylaxis: Heparin gtt Code Status: Full Family Communication: Pt in room, family not at bedside, discussed with daughter on phone on 07/23/2018. Disposition Plan: Uncertain at this time  Consultants:   Vascular surgery  Gastroenterology  Infectious disease  Interventional radiology  General surgery  Procedures:   EGD on 4/14  Flex sigmoidoscopy on 4/14  IR drain placement on 4/16 which showed a large left colon defect, 4/16  Ex Lap, LOA, Left sigmoid colectomy, descending colostomy (Hartmann's), Dr. Derrell Lolling, 4/16  Antimicrobials: Anti-infectives (From admission, onward)   Start     Dose/Rate Route Frequency Ordered Stop   07/15/18 1700  meropenem (MERREM) 1 g in sodium chloride  0.9 % 100 mL IVPB     1 g 200 mL/hr over 30 Minutes Intravenous Every 8 hours 07/15/18 1620     07/08/18 2300  vancomycin (VANCOCIN) 1,500 mg in sodium chloride 0.9 % 500 mL IVPB  Status:  Discontinued     1,500 mg 250 mL/hr over 120 Minutes Intravenous  Once 07/07/18 2128 07/07/18 2129   07/08/18 2300  vancomycin (VANCOCIN) 1,500 mg in sodium chloride 0.9 % 500 mL IVPB  Status:  Discontinued     1,500 mg 250 mL/hr over 120 Minutes Intravenous Every 24 hours 07/07/18 2129 07/08/18 0726   07/08/18 0600  ceFEPIme (MAXIPIME) 2 g in sodium chloride 0.9 % 100 mL IVPB  Status:  Discontinued     2 g 200 mL/hr over 30 Minutes Intravenous Every 8 hours 07/07/18 2124 07/13/18 1333   07/07/18 2130  vancomycin (VANCOCIN) 1,500 mg in sodium chloride 0.9 % 500 mL IVPB     1,500 mg 250 mL/hr over 120 Minutes Intravenous  Once 07/07/18 2123 07/08/18 0028   07/07/18 2115  vancomycin (VANCOCIN) IVPB 1000 mg/200 mL premix  Status:  Discontinued     1,000 mg 200 mL/hr over 60 Minutes Intravenous  Once 07/07/18 2110 07/07/18 2128   07/07/18 2115  ceFEPIme (MAXIPIME) 2 g in sodium chloride 0.9 % 100 mL IVPB     2 g 200 mL/hr over 30 Minutes Intravenous  Once 07/07/18 2110 07/07/18 2203      Subjective: No acute complaint.  No nausea no vomiting.  Still no output from the colostomy bag.  Objective: Vitals:   07/25/18 0600 07/25/18 0605 07/25/18 0800 07/25/18  1211  BP: (!) 150/58  (!) 154/71 (!) 139/56  Pulse: 79  74 78  Resp: 19   20  Temp: 98.4 F (36.9 C)  98.6 F (37 C) 99.1 F (37.3 C)  TempSrc: Oral  Oral Oral  SpO2: 96%  95% 96%  Weight:  63.4 kg    Height:        Intake/Output Summary (Last 24 hours) at 07/25/2018 1653 Last data filed at 07/25/2018 1538 Gross per 24 hour  Intake 3727.95 ml  Output 2725 ml  Net 1002.95 ml   Filed Weights   07/23/18 0409 07/24/18 0517 07/25/18 0605  Weight: 65.8 kg 63.4 kg 63.4 kg    Examination:  General exam: Appears calm and comfortable   Respiratory system: Clear to auscultation. Respiratory effort normal. Cardiovascular system: S1 & S2 heard, RRR Gastrointestinal system: Soft, nondistended, mildly tender Central nervous system: Alert and oriented. No focal neurological deficits. Extremities: Symmetric 5 x 5 power. Skin: No rashes, lesions Psychiatry: Judgement and insight appear normal. Mood & affect appropriate.   Data Reviewed: I have personally reviewed following labs and imaging studies  CBC: Recent Labs  Lab 07/19/18 0432 07/20/18 0536 07/21/18 0541 07/22/18 0643 07/23/18 0548 07/24/18 0347 07/25/18 0601  WBC 26.1* 21.4* 19.6* 18.0* 19.3* 19.3* 17.6*  NEUTROABS 22.7* 17.2*  --   --   --   --   --   HGB 7.5* 8.5* 8.3* 8.2* 8.2* 7.9* 7.7*  HCT 25.7* 28.8* 27.4* 27.1* 26.5* 25.8* 25.4*  MCV 86.5 83.2 83.5 82.6 82.6 81.1 81.2  PLT 283 295 267 296 299 357 319   Basic Metabolic Panel: Recent Labs  Lab 07/19/18 0432 07/20/18 0536 07/21/18 0541 07/22/18 0643 07/23/18 0548 07/24/18 0347  NA 140 135 134* 133* 133* 132*  K 3.2* 4.2 4.5 4.3 4.5 4.3  CL 109 103 103 103 103 103  CO2 28 23 23 23 22 23   GLUCOSE 138* 126* 104* 106* 130* 116*  BUN 7* 11 13 13 13 12   CREATININE 0.40* 0.43* 0.34* 0.35* 0.36* 0.40*  CALCIUM 7.7* 8.0* 8.1* 8.0* 8.1* 7.9*  MG 1.7 1.5* 1.9  --  1.7 1.9  PHOS 1.9* 2.2* 2.9  --  3.1  --    GFR: Estimated Creatinine Clearance: 62.7 mL/min (A) (by C-G formula based on SCr of 0.4 mg/dL (L)). Liver Function Tests: Recent Labs  Lab 07/19/18 0432 07/20/18 0536 07/23/18 0548  AST 71* 46* 69*  ALT 36 32 35  ALKPHOS 105 143* 267*  BILITOT 0.5 0.6 0.6  PROT 4.7* 5.2* 5.1*  ALBUMIN 1.3* 1.3* 1.3*   No results for input(s): LIPASE, AMYLASE in the last 168 hours. No results for input(s): AMMONIA in the last 168 hours. Coagulation Profile: No results for input(s): INR, PROTIME in the last 168 hours. Cardiac Enzymes: No results for input(s): CKTOTAL, CKMB, CKMBINDEX, TROPONINI in the  last 168 hours. BNP (last 3 results) No results for input(s): PROBNP in the last 8760 hours. HbA1C: No results for input(s): HGBA1C in the last 72 hours. CBG: Recent Labs  Lab 07/24/18 2025 07/24/18 2351 07/25/18 0552 07/25/18 0907 07/25/18 1208  GLUCAP 137* 122* 138* 120* 122*   Lipid Profile: No results for input(s): CHOL, HDL, LDLCALC, TRIG, CHOLHDL, LDLDIRECT in the last 72 hours. Thyroid Function Tests: No results for input(s): TSH, T4TOTAL, FREET4, T3FREE, THYROIDAB in the last 72 hours. Anemia Panel: No results for input(s): VITAMINB12, FOLATE, FERRITIN, TIBC, IRON, RETICCTPCT in the last 72 hours. Sepsis Labs: No  results for input(s): PROCALCITON, LATICACIDVEN in the last 168 hours.  No results found for this or any previous visit (from the past 240 hour(s)).   Radiology Studies: Ct Abdomen Pelvis W Contrast  Result Date: 07/23/2018 CLINICAL DATA:  Abdominal pain with fever EXAM: CT ABDOMEN AND PELVIS WITH CONTRAST TECHNIQUE: Multidetector CT imaging of the abdomen and pelvis was performed using the standard protocol following bolus administration of intravenous contrast. CONTRAST:  OMNIPAQUE IOHEXOL 300 MG/ML  SOLN COMPARISON:  CT 07/16/2018, 07/15/2018, 07/09/2018 FINDINGS: Lower chest: Development of small left pleural effusion. Dependent atelectasis within the posterior lower lobes. Borderline to mild cardiomegaly. Hepatobiliary: Subcentimeter hypodensity within the liver, no change. Diffuse increased density in the gallbladder lumen possible sludge. No biliary dilatation. Pancreas: No ductal dilatation. Further decrease in size of presumed pseudocyst at the tail of the pancreas, now measuring 19 mm. Mild residual inflammatory changes. Spleen: Similar appearance multifocal hypodensities, possible chronic infarcts. Adrenals/Urinary Tract: Adrenal glands are normal. No hydronephrosis. Scarring within the mid to lower pole of the right kidney. Small focus of air within the  urinary bladder. Stomach/Bowel: Focal defect within the gastric wall near the fundal region/anterior proximal body of the stomach, series 6, image number 45, series 7, image number 91. Fluid and gas collection cephalad to this consistent with perforation. Drainage catheter is noted in the region. Interval partial colectomy with creation of right lower quadrant colostomy. Small fat containing parastomal hernia. Mild edema adjacent to the ostomy loop presumably due to resolving operative change. Blind ending rectosigmoid stump is unremarkable. Vascular/Lymphatic: Extensive aortic atherosclerosis. Chronically occluded proximal celiac artery. SMA is enhancing. Status post aorto femoral bypass with luminal irregularity of the proximal right iliac limb but with flow present. Small residual fluid collection in the right groin, likely postoperative. No significant adenopathy. Reproductive: Status post hysterectomy. 3.6 cm left adnexal cyst, minimal increase in size. Other: Interval large ventral wound. Placement of 2 left abdominal drainage catheters, the more cephalad catheter tip terminates within the upper anterior abdomen. The more inferior drainage catheter tip is visible in the left lower quadrant. Multiple foci of extraluminal gas within the left upper quadrant mesentery, noted to be in the region of previously noted perforated colon. Moderate edema and soft tissue stranding within the mesentery. Small foci of free air adjacent to the liver. Multiple small organizing fluid collections within the left abdomen, largest is seen in the left gutter and measures 2.3 cm. Small organizing fluid collection anterior to the distal body of the pancreas. Small gas and fluid collection along the inferior aspect of the stomach. Musculoskeletal: Postsurgical changes L3 through L5 with posterior rods and fixating screws. Left pedicle screw at L3 breaches the superior endplate. Fat containing mass anterior to the right hip.  IMPRESSION: 1. Interval partial colectomy with creation of right lower quadrant colostomy. Placement of left upper and lower quadrant drainage catheters. Moderate amount of pneumoperitoneum within the left upper quadrant mesentery and adjacent to the liver, suspect that this may be secondary to residual operative air. There is however a focal defect within the fundal/anterior proximal body of the stomach, adjacent to the left upper quadrant drainage catheter which is contiguous with gas and fluid surrounding a portion of the catheter. This is suspected to represent a focal gastric perforation. Moderate inflammatory changes in the left upper quadrant mesentery, likely due to resolving postsurgical change. 2. Multiple small organizing fluid collections within the left mid and upper abdomen, suspect to represent small intra-abdominal abscesses. The largest is seen  in the left gutter and measures 2.3 cm. 3. Development of small left pleural effusion with basilar atelectasis. 4. Other chronic findings as previously reported. Decreased size of presumed pseudocyst in the pancreatic tail. Critical Value/emergent results were called by telephone at the time of interpretation on 07/23/2018 at 8:06 pm to Dr. Luisa Hart, who verbally acknowledged these results. Electronically Signed   By: Jasmine Pang M.D.   On: 07/23/2018 20:06    Scheduled Meds: . atorvastatin  40 mg Oral Daily  . Chlorhexidine Gluconate Cloth  6 each Topical Q0600  . feeding supplement (ENSURE ENLIVE)  237 mL Oral TID BM  . insulin aspart  0-9 Units Subcutaneous Q4H  . lidocaine  1 patch Transdermal Q24H  . metoprolol tartrate  75 mg Oral BID  . nicotine  21 mg Transdermal Daily  . pantoprazole (PROTONIX) IV  40 mg Intravenous Q24H  . pregabalin  200 mg Oral TID  . saccharomyces boulardii  250 mg Oral BID  . sodium chloride flush  10-40 mL Intracatheter Q12H   Continuous Infusions: . sodium chloride 10 mL/hr at 07/22/18 2300  . heparin 1,200  Units/hr (07/25/18 1151)  . meropenem (MERREM) IV 1 g (07/25/18 1458)  . methocarbamol (ROBAXIN) IV 500 mg (07/25/18 1538)  . TPN ADULT (ION) 83 mL/hr at 07/24/18 1728  . TPN ADULT (ION)       LOS: 18 days   Lynden Oxford, MD [facility] Pager On Amion  If 7PM-7AM, please contact night-coverage 07/25/2018, 4:53 PM

## 2018-07-25 NOTE — Progress Notes (Signed)
Patient's colostomy bag was leaking on right side. Colostomy bag changed, area around stoma cleaned, dressing changed on abdomen. Stoma brownish maroon in color. Possibly dry?

## 2018-07-25 NOTE — Progress Notes (Signed)
PHARMACY - ADULT TOTAL PARENTERAL NUTRITION CONSULT NOTE   Pharmacy Consult for TPN Indication: Post-op ileus  Patient Measurements: Height: 5\' 7"  (170.2 cm) Weight: 139 lb 12.4 oz (63.4 kg) IBW/kg (Calculated) : 61.6 TPN AdjBW (KG): 62.3 Body mass index is 21.89 kg/m.  Assessment: 46-yo female with h/o chronic N/V/abdominal pain, A. fib, PVD status post right femoral thrombectomy on 05/31/2018, hypertension and chronic back pain presenting with intractable nausea, vomiting, abdominal pain, new onset of diarrhea and about 30 pounds weight loss in the last several months.   She was admitted to [facility] with a diagnosis of sepsis. 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 Also there is a 3.9 x 1.8 cm and 2.6 x 1.1 cm fluid collection in right pelvis/groin thought to be seroma from prior surgery. IR at [facility] was consulted for stenting her SMA which has about a (30-50% stenosis) to improve mesenteric blood flow.   Patient was transferred to [facility] for further management. S/p upper endoscopy 07/14/18 found Severe ulcerative gastritis most consistent ischemia, biopsied, results pending. CT showed perforation of descending colon. 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 Now with post-op ileus in the setting of 30 pound weight loss and minimal intake since admission.   GI: Open abdomen. Small abscesses on CT. 2 x JP drains with 25 mls output, nothing from colostomy, LBM 4/22; Prealbumin <5. NGT pulled out 4/17- no plan to replace unless develops N/V or abdominal distension. Endo: No history of  diabetes; CBGs <180 Insulin requirements in the past 24 hours: 1 unit Lytes: Na slightly low at 132 (increased in TPN); Corr Ca upper end at 10.1 (Alb 1.3); K & Phos wnl, Mg low at 1.9 (goal >2; replaced) Renal: Scr 0.4; BUN 12; UOP 1.5 ml/kg/hr, net +4L Pulm: RA Cards: SBP improved to 120-130; HR 70s on metoprolol and heparin drip Hepatobil:AST/ALT 69/35 (bumped back up some); TBili 0.6; TGs 65 Neuro: No complaints. Pain fairly controlled. ID: WBC slow decline to 17.6; afebrile; Meropenem 4/15>>  TPN Access: PICC requested for today (4/18) TPN start date: 4/18 Nutritional Goals (per RD recommendation on 4/22): Kcal:  2000-2200 kcals (32-36 kcal/kg bw) Protein:  112-125g Pro  (1.8-2g/kg bw) Fluid:  2-2.2 L (26ml/kcal)  Goal TPN rate is 83 ml/hr (This TPN will provide 120 g of protein, 269 g of dextrose, and 67 g of lipids which provides 2003  kCals per day, meeting 100% of patient needs)  Current Nutrition:  Full liquids - 10-25% intake >> advancing per Surgery 4/25 to Soft diet Ensure Enlive 237 mL po TID - took 2 on 4/24 (each provides 350 kcal and 20 g protein) TPN at 83 ml/hr  Plan:  Wean TPN slowly to 60 ml/hr (per Surgery recommendation in progress note 4/25 as advancing to soft diet, will help with volume reduction as well) This TPN will provide 86.4 g of protein, 194.4 g of dextrose, and 48.9 g of lipids which provides 1495.6  kCals per day, meeting 75% of patient needs -With current Ensure intake, will meet 100% of patients needs (soft diet intake will be additional) Electrolytes in TPN:  Standard except increase Na and increase Mg Add MVI to TPN - trace elements are on back order, will only place in TPN on Monday, Wednesday and Friday Continue sensitive SSI and adjust as needed Add Thiamine and Folate toTPN Monitor TPN labs Mon and Thurs.  F/u daily BMP as ordered by MD F/u enteral intake and ability to advance diet as able - would like for this patient to be able to stop  TPN with ongoing infectious process.  Link SnufferJessica Tilla Wilborn, PharmD, BCPS, BCCCP Clinical Pharmacist 07/25/2018 7:28 AM  **Pharmacist phone directory can now be found on [email or web address] listed under [facility]**

## 2018-07-25 NOTE — Progress Notes (Signed)
ANTICOAGULATION CONSULT NOTE - Follow Up Consult  Pharmacy Consult for Heparin Indication: atrial fibrillation  No Known Allergies  Patient Measurements: Height: 5\' 7"  (170.2 cm) Weight: 139 lb 12.4 oz (63.4 kg) IBW/kg (Calculated) : 61.6 Heparin Dosing Weight: 65.8 kg  Vital Signs: Temp: 98.4 F (36.9 C) (04/25 0600) Temp Source: Oral (04/25 0600) BP: 150/58 (04/25 0600) Pulse Rate: 79 (04/25 0600)  Labs: Recent Labs    07/23/18 0548 07/24/18 0347 07/24/18 1216 07/24/18 2215 07/25/18 0601  HGB 8.2* 7.9*  --   --  7.7*  HCT 26.5* 25.8*  --   --  25.4*  PLT 299 357  --   --  319  HEPARINUNFRC 0.66 0.88* 0.84* 0.71* 0.43  CREATININE 0.36* 0.40*  --   --   --     Estimated Creatinine Clearance: 62.7 mL/min (A) (by C-G formula based on SCr of 0.4 mg/dL (L)).  Assessment:   Anticoag: Eliquis PTA>Heparin. New afib and not clear when the DOAC started. Was on PTA but not at the last clinic visit. S/p GI procedure and left abd abscess drain placement 4/16. Heparin resumed 4/17. - Heparin level now within goal range. Hgb low but relatively stable.  Platelets OK. No bleeding noted per RN.  Goal of Therapy:  Heparin level 0.3-0.7 units/ml Monitor platelets by anticoagulation protocol: Yes   Plan:  Continue heparin drip at 1200 units/hr. Heparin level with AM labs Monitor CBC, s/sx bleeding    Jenetta Downer, [facility] Clinical Pharmacist Phone [phone]  07/25/2018 8:50 AM

## 2018-07-26 LAB — GLUCOSE, CAPILLARY
Glucose-Capillary: 112 mg/dL — ABNORMAL HIGH (ref 70–99)
Glucose-Capillary: 121 mg/dL — ABNORMAL HIGH (ref 70–99)
Glucose-Capillary: 136 mg/dL — ABNORMAL HIGH (ref 70–99)
Glucose-Capillary: 102 mg/dL — ABNORMAL HIGH (ref 70–99)
Glucose-Capillary: 113 mg/dL — ABNORMAL HIGH (ref 70–99)

## 2018-07-26 LAB — CBC
HCT: 24.8 % — ABNORMAL LOW (ref 36.0–46.0)
Hemoglobin: 7.6 g/dL — ABNORMAL LOW (ref 12.0–15.0)
MCH: 24.9 pg — ABNORMAL LOW (ref 26.0–34.0)
MCHC: 30.6 g/dL (ref 30.0–36.0)
MCV: 81.3 fL (ref 80.0–100.0)
Platelets: 367 10*3/uL (ref 150–400)
RBC: 3.05 MIL/uL — ABNORMAL LOW (ref 3.87–5.11)
RDW: 18.5 % — ABNORMAL HIGH (ref 11.5–15.5)
WBC: 18.8 10*3/uL — ABNORMAL HIGH (ref 4.0–10.5)
nRBC: 1 % — ABNORMAL HIGH (ref 0.0–0.2)

## 2018-07-26 LAB — BASIC METABOLIC PANEL
Anion gap: 9 (ref 5–15)
BUN: 11 mg/dL (ref 8–23)
CO2: 23 mmol/L (ref 22–32)
Calcium: 8.5 mg/dL — ABNORMAL LOW (ref 8.9–10.3)
Chloride: 101 mmol/L (ref 98–111)
Creatinine, Ser: 0.41 mg/dL — ABNORMAL LOW (ref 0.44–1.00)
GFR calc Af Amer: >60 mL/min (ref >60)
GFR calc non Af Amer: >60 mL/min (ref >60)
Glucose, Bld: 129 mg/dL — ABNORMAL HIGH (ref 70–99)
Potassium: 4.6 mmol/L (ref 3.5–5.1)
Sodium: 133 mmol/L — ABNORMAL LOW (ref 135–145)

## 2018-07-26 LAB — HEPARIN LEVEL (UNFRACTIONATED): Heparin Unfractionated: 0.41 IU/mL (ref 0.30–0.70)

## 2018-07-26 MED ORDER — TRAVASOL 10 % IV SOLN
INTRAVENOUS | Status: AC
  Administered 2018-07-26: 17:00:00 via INTRAVENOUS
  Filled 2018-07-26: qty 864

## 2018-07-26 NOTE — Plan of Care (Signed)
Care plans reviewed and patient is progressing.  

## 2018-07-26 NOTE — Progress Notes (Signed)
[facility]   Subjective  - No new abdominal complaints.   Objective 137/67 78 98.7 F (37.1 C) (Oral) 18 95%  Intake/Output Summary (Last 24 hours) at 07/26/2018 1013 Last data filed at 07/26/2018 0609 Gross per 24 hour  Intake 2498.77 ml  Output 1520 ml  Net 978.77 ml    Abd: soft, no rebound or guarding Weakly palpable left DP pulse, left great and 2nd toes with dry gangrene  Laboratory Lab Results: Recent Labs    07/25/18 0601 07/26/18 0239  WBC 17.6* 18.8*  HGB 7.7* 7.6*  HCT 25.4* 24.8*  PLT 319 367   BMET Recent Labs    07/24/18 0347 07/26/18 0904  NA 132* 133*  K 4.3 4.6  CL 103 101  CO2 23 23  GLUCOSE 116* 129*  BUN 12 11  CREATININE 0.40* 0.41*  CALCIUM 7.9* 8.5*    COAG Lab Results  Component Value Date   INR 1.3 (H) 07/10/2018   INR 1.7 (H) 07/07/2018   INR 1.05 05/17/2009   No results found for: PTT  Assessment/Planning:  Tentative plan for mesenteric angiogram prior to discharge this week.  Nurse also pointed out yesterday patient now has dry gangrene on left 1st and 2nd toes in setting of weakly palpable left DP pulse.  Patient states this has been present for several weeks.  We can likely evaluate runoff in left lower extremity during mesenteric angiogram as well.    [name] 07/26/2018 10:13 AM --

## 2018-07-26 NOTE — Progress Notes (Signed)
ANTICOAGULATION CONSULT NOTE - Follow Up Consult  Pharmacy Consult for Heparin Indication: atrial fibrillation  No Known Allergies  Patient Measurements: Height: 5\' 7"  (170.2 cm) Weight: 141 lb 8.6 oz (64.2 kg) IBW/kg (Calculated) : 61.6 Heparin Dosing Weight: 65.8 kg  Vital Signs: Temp: 98.2 F (36.8 C) (04/26 0401) Temp Source: Oral (04/26 0401) BP: 149/66 (04/26 0401) Pulse Rate: 77 (04/25 2034)  Labs: Recent Labs    07/24/18 0347  07/24/18 2215 07/25/18 0601 07/26/18 0239  HGB 7.9*  --   --  7.7* 7.6*  HCT 25.8*  --   --  25.4* 24.8*  PLT 357  --   --  319 367  HEPARINUNFRC 0.88*   < > 0.71* 0.43 0.41  CREATININE 0.40*  --   --   --   --    < > = values in this interval not displayed.    Estimated Creatinine Clearance: 62.7 mL/min (A) (by C-G formula based on SCr of 0.4 mg/dL (L)).  Assessment:   Anticoag: Eliquis PTA>Heparin. New afib and not clear when the DOAC started. Was on PTA but not at the last clinic visit. S/p GI procedure and left abd abscess drain placement 4/16. Heparin resumed 4/17. - Heparin level remains within goal range. Hgb low but relatively stable.  Platelets OK. No bleeding noted per RN.  Goal of Therapy:  Heparin level 0.3-0.7 units/ml Monitor platelets by anticoagulation protocol: Yes   Plan:  Continue heparin drip at 1200 units/hr. Heparin level with AM labs Monitor CBC, s/sx bleeding    Jenetta Downer, [facility] Clinical Pharmacist Phone [phone]  07/26/2018 8:25 AM

## 2018-07-26 NOTE — Progress Notes (Signed)
PHARMACY - ADULT TOTAL PARENTERAL NUTRITION CONSULT NOTE   Pharmacy Consult for TPN Indication: Post-op ileus  Patient Measurements: Height: 5\' 7"  (170.2 cm) Weight: 141 lb 8.6 oz (64.2 kg) IBW/kg (Calculated) : 61.6 TPN AdjBW (KG): 62.3 Body mass index is 22.17 kg/m.  Assessment: 79-yo female with h/o chronic N/V/abdominal pain, A. fib, PVD status post right femoral thrombectomy on 05/31/2018, hypertension and chronic back pain presenting with intractable nausea, vomiting, abdominal pain, new onset of diarrhea and about 30 pounds weight loss in the last several months.   She was admitted to [facility] with a diagnosis of sepsis. 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 Also there is a 3.9 x 1.8 cm and 2.6 x 1.1 cm fluid collection in right pelvis/groin thought to be seroma from prior surgery. 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   Patient was transferred to [facility] for further management. S/p upper endoscopy 07/14/18 found Severe ulcerative gastritis most consistent ischemia, biopsied, results pending. CT showed perforation of descending colon. 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 Now with post-op ileus in the setting of 30 pound weight loss and minimal intake since admission.   GI: Open abdomen. Small abscesses on CT. 2 x JP drains with 25 mls output, nothing from colostomy, LBM 4/22; Prealbumin <5. NGT pulled out 4/17- no plan to replace unless develops N/V or abdominal distension. Endo: No history of  diabetes; CBGs <180 Insulin requirements in the past 24 hours: 0 unit Lytes: Na slightly low at 132 (increased in TPN); Corr Ca upper end at 10.1 (Alb 1.3); K & Phos wnl, Mg low at 1.9 (goal >2; replaced) Renal: Scr 0.4; BUN 12; UOP 1.5 ml/kg/hr, net +4.7L Pulm: RA Cards: SBP improved to 120-130; HR 70s on metoprolol and heparin drip Hepatobil:AST/ALT 69/35 (bumped back up some); TBili 0.6; TGs 65 Neuro: No complaints. Pain fairly controlled. ID: WBC up at 18.8; afebrile; Meropenem 4/15>>  TPN Access: PICC requested for today (4/18) TPN start date: 4/18 Nutritional Goals (per RD recommendation on 4/22): Kcal:  2000-2200 kcals (32-36 kcal/kg bw) Protein:  112-125g Pro  (1.8-2g/kg bw) Fluid:  2-2.2 L (49ml/kcal)  Goal TPN rate is 83 ml/hr (This TPN will provide 120 g of protein, 269 g of dextrose, and 67 g of lipids which provides 2003  kCals per day, meeting 100% of patient needs)  Current Nutrition:  Soft diet --fair appetite, 0-10% intake Ensure Enlive 237 mL po TID - took 1 on 4/25 (each provides 350 kcal and 20 g protein) TPN at 60 ml/hr  Plan:  Continue TPN at 60 ml/hr (wean per Surgery recommendation in progress note 4/25 & 4/26 as advancing to soft diet, will help with volume reduction as well) -- No further reduction today with low po intake This TPN will provide 86.4 g of protein, 194.4 g of dextrose, and 48.9 g of lipids which provides 1495.6  kCals per day, meeting 75% of patient needs -With current Ensure intake, will meet ~95% of protein and ~92% kcal needs (soft diet intake  will be additional) Electrolytes in TPN: Standard except increase Na and increase Mg Add MVI to TPN - trace elements are on back order, will only place in TPN on Monday, Wednesday and Friday Continue sensitive SSI and adjust as needed Add Thiamine and Folate toTPN Monitor TPN labs Mon and Thurs.  F/u daily BMP as ordered by MD F/u enteral intake and ability to advance diet as able - would like for  this patient to be able to stop TPN with ongoing infectious process.  Link SnufferJessica Ollivander See, PharmD, BCPS, BCCCP Clinical Pharmacist 07/26/2018 7:27 AM  **Pharmacist phone directory can now be found on [email or web address] listed under [facility]**

## 2018-07-26 NOTE — Progress Notes (Signed)
PROGRESS NOTE    Danielle FilbertLinda S Grant  XLK:[id number] DOB: May 04, 1945 DOA: 07/07/2018 PCP: Danielle SpencerHawks, Danielle A, [name]    Brief Narrative:  73 year old female with history of chronic N/V/abdominal pain, A. fib, PVD status post right femoral thrombectomy on 05/31/2018, hypertension and chronic back pain presenting with intractable nausea, vomiting, abdominal pain, new onset of diarrhea and about 30 pounds weight loss in the last several months.  [facility] with somnolence and a diagnosis of sepsis. She has had a prior aortobifem by Dr Hart RochesterLawson in 2002 and had thrombectomy of the right limb of this by Dr Randie Heinzain 3/20. 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 Her left internal iliac artery was patent at that point.   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 Also there is a 3.9 x 1.8 cm and 2.6 x 1.1 cm fluid collection in right pelvis/groin thought to be seroma from prior surgery.   Per H&P, [facility] was consulted and consideration was being given for stenting her SMA which has about a (30-50% stenosis) to improve mesenteric blood flow.   Patient [facility] for [facility] management.  Patient was also treated with cefepime for presumed pneumonia.  Gastroenterology and vascular surgery consulted.  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  Biopsies taken.  She also had flexible sigmoidoscopy that showed only a 7 mm sessile polyp in the rectum but poor prep.  On 4/15, and  continued to have significant leukocytosis, mild temperature and abdominal tenderness.  This raised concern about intra-abdominal occult infection especially with the fluid collections noted on CT abdomen.  Infectious disease consulted and started patient on meropenem.  IR consulted and recommended getting repeat CT abdomen and pelvis which showed frank perforation of the descending colon appears to be contained within a portion of the omentum, or occluded celiac axis, slightly decreased pancreatic tail lesion but no previously noted fluid collections.  General surgery and IR consulted.  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 Derrell Lollingamirez on 4/16.  Abdomen left open out of concern for infectious process and ileus. Started on TPN on 07/18/2018.  Continues to have minimal oral intake.  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 Derrell Lollingamirez, 4/16 Postop ileus -Abdomen remains open -Bowel sounds present but no output in ostomy bag. -IR and general surgery following -VSS on board as well-plan angiogram for SMA stenosis before discharge -Pain control per surgery-PRN morphine, IV Tylenol and Robaxin. -Hemoglobin stable, mild worsening of Leukocytosis -TPN feeding started 4/18. Appreciate surgical assistance in weaning the TPN. -Pharmacy to manage electrolyte abnormalities with TPN. -Continue meropenem 4/15--per ID.   For now no end date recommended. -Appreciate PT. Encourage incentive spirometry. -Continue zofran as needed. -Appreciate general surgery assistance.  CT abdomen  showed some small pockets of abscess as well as contained air with surgery thinks is postprocedure.  Does not think the patient actually has a new  rupture.  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 -Likely from occluded celiac axis -Continue PPI -Vascular surgery following per above  Significant weight loss/moderate protein calorie malnutrition: Likely due to poor p.o. intake from the above. -Currently awaiting bowel function to feed. -TPN as tolerated -Colon biopsy from the surgery was negative for any malignancy, biopsy from body of stomach were suggestive of intestinal metaplasia but no malignancy.  History of COPD: Stable -Continue DuoNeb as needed   Peripheral vascular disease with dry gangrene: Stable. -Continue home statin -Outpatient vascular surgery follow-up -On therapeutic anticoagulation with IV heparin per pharmacy  Essential hypertension: Fairly controlled. -Continue home meds  Chronic atrial fibrillation: Not in RVR.   Chad2vasc score 6. -On Eliquis and metoprolol at home. -Continued on Heparin and metoprolol -Remains rate controlled  Chronic pain: -Pain medications as above -Continue Lyrica 200 3 times daily, low threshold to reduce the dose if the patient remains sleepy and confused  Tobacco use disorder: -Cessation counseling -Continue Nicotine patch. Stable  DVT prophylaxis: Heparin gtt Code Status: Full Family Communication: Pt in room, family not at bedside, discussed with daughter on phone on 07/23/2018. Disposition Plan: Uncertain at this time  Consultants:   Vascular surgery  Gastroenterology  Infectious disease  Interventional radiology  General surgery  Procedures:   EGD on 4/14  Flex sigmoidoscopy on 4/14  IR drain placement on 4/16 which showed a large left colon defect, 4/16  Ex Lap, LOA, Left sigmoid colectomy, descending colostomy (Hartmann's), Dr. Derrell Lolling, 4/16  Antimicrobials: Anti-infectives (From admission, onward)   Start     Dose/Rate Route Frequency Ordered Stop   07/15/18 1700   meropenem (MERREM) 1 g in sodium chloride 0.9 % 100 mL IVPB     1 g 200 mL/hr over 30 Minutes Intravenous Every 8 hours 07/15/18 1620     07/08/18 2300  vancomycin (VANCOCIN) 1,500 mg in sodium chloride 0.9 % 500 mL IVPB  Status:  Discontinued     1,500 mg 250 mL/hr over 120 Minutes Intravenous  Once 07/07/18 2128 07/07/18 2129   07/08/18 2300  vancomycin (VANCOCIN) 1,500 mg in sodium chloride 0.9 % 500 mL IVPB  Status:  Discontinued     1,500 mg 250 mL/hr over 120 Minutes Intravenous Every 24 hours 07/07/18 2129 07/08/18 0726   07/08/18 0600  ceFEPIme (MAXIPIME) 2 g in sodium chloride 0.9 % 100 mL IVPB  Status:  Discontinued     2 g 200 mL/hr over 30 Minutes Intravenous Every 8 hours 07/07/18 2124 07/13/18 1333   07/07/18 2130  vancomycin (VANCOCIN) 1,500 mg in sodium chloride 0.9 % 500 mL IVPB     1,500 mg 250 mL/hr over 120 Minutes Intravenous  Once 07/07/18 2123 07/08/18 0028   07/07/18 2115  vancomycin (VANCOCIN) IVPB 1000 mg/200 mL premix  Status:  Discontinued     1,000 mg 200 mL/hr over 60 Minutes Intravenous  Once 07/07/18 2110 07/07/18 2128   07/07/18 2115  ceFEPIme (MAXIPIME) 2 g in sodium chloride 0.9 % 100 mL IVPB     2 g 200 mL/hr over 30 Minutes Intravenous  Once 07/07/18 2110 07/07/18 2203      Subjective: No acute complaint. No acute event overnight.  Objective: Vitals:   07/25/18 2034 07/26/18 0401 07/26/18 0930 07/26/18 1340  BP: (!) 156/74 (!) 149/66 137/67 (!) 163/86  Pulse: 77  78 76  Resp: Temp: 98.7 F (37.1 C) 98.2 F (36.8 C) 98.7 F (37.1 C) 98.7 F (37.1 C)  TempSrc: Oral Oral Oral Oral  SpO2: 98% 96% 95% 98%  Weight:  64.2 kg    Height:        Intake/Output Summary (Last 24 hours) at 07/26/2018 1853 Last data filed at 07/26/2018 1600 Gross per 24 hour  Intake 2580.8 ml  Output 2155 ml  Net 425.8 ml   Filed Weights   07/24/18 0517 07/25/18 0605 07/26/18 0401  Weight: 63.4 kg 63.4 kg 64.2 kg    Examination:  General exam:  Appears calm and comfortable  Respiratory system: Clear to auscultation. Respiratory effort normal. Cardiovascular system: S1 & S2 heard, RRR Gastrointestinal system: Soft, nondistended, mildly tender Central nervous system: Alert and oriented. No focal neurological deficits. Extremities: Symmetric 5 x 5 power. Skin: No rashes, lesions Psychiatry: Judgement and insight appear normal. Mood & affect appropriate.   Data Reviewed: I have personally reviewed following labs and imaging studies  CBC: Recent Labs  Lab 07/20/18 0536  07/22/18 0643 07/23/18 0548 07/24/18 0347 07/25/18 0601 07/26/18 0239  WBC 21.4*   < > 18.0* 19.3* 19.3* 17.6* 18.8*  NEUTROABS 17.2*  --   --   --   --   --   --   HGB 8.5*   < > 8.2* 8.2* 7.9* 7.7* 7.6*  HCT 28.8*   < > 27.1* 26.5* 25.8* 25.4* 24.8*  MCV 83.2   < > 82.6 82.6 81.1 81.2 81.3  PLT 295   < > 296 299 357 319 367   < > = values in this interval not displayed.   Basic Metabolic Panel: Recent Labs  Lab 07/20/18 0536 07/21/18 0541 07/22/18 0643 07/23/18 0548 07/24/18 0347 07/26/18 0904  NA 135 134* 133* 133* 132* 133*  K 4.2 4.5 4.3 4.5 4.3 4.6  CL 103 103 103 103 103 101  CO2 GLUCOSE 126* 104* 106* 130* 116* 129*  BUN CREATININE 0.43* 0.34* 0.35* 0.36* 0.40* 0.41*  CALCIUM 8.0* 8.1* 8.0* 8.1* 7.9* 8.5*  MG 1.5* 1.9  --  1.7 1.9  --   PHOS 2.2* 2.9  --  3.1  --   --    GFR: Estimated Creatinine Clearance: 62.7 mL/min (A) (by C-G formula based on SCr of 0.41 mg/dL (L)). Liver Function Tests: Recent Labs  Lab 07/20/18 0536 07/23/18 0548  AST 46* 69*  ALT 32 35  ALKPHOS 143* 267*  BILITOT 0.6 0.6  PROT 5.2* 5.1*  ALBUMIN 1.3* 1.3*   No results for input(s): LIPASE, AMYLASE in the last 168 hours. No results for input(s): AMMONIA in the last 168 hours. Coagulation Profile: No results for input(s): INR, PROTIME in the last 168 hours. Cardiac Enzymes: No results for input(s): CKTOTAL,  CKMB, CKMBINDEX, TROPONINI in the last 168 hours. BNP (last 3 results) No results for input(s): PROBNP in the last 8760 hours. HbA1C: No results for input(s): HGBA1C in the last 72 hours. CBG: Recent Labs  Lab 07/25/18 2350 07/26/18 0336 07/26/18 0917 07/26/18 1150 07/26/18 1727  GLUCAP 119* 112* 121* 136* 102*   Lipid Profile: No results for input(s): CHOL, HDL, LDLCALC, TRIG, CHOLHDL, LDLDIRECT in the last 72 hours. Thyroid Function Tests: No results for input(s): TSH, T4TOTAL, FREET4, T3FREE, THYROIDAB in  the last 72 hours. Anemia Panel: No results for input(s): VITAMINB12, FOLATE, FERRITIN, TIBC, IRON, RETICCTPCT in the last 72 hours. Sepsis Labs: No results for input(s): PROCALCITON, LATICACIDVEN in the last 168 hours.  No results found for this or any previous visit (from the past 240 hour(s)).   Radiology Studies: No results found.  Scheduled Meds:  atorvastatin  40 mg Oral Daily   Chlorhexidine Gluconate Cloth  6 each Topical Q0600   feeding supplement (ENSURE ENLIVE)  237 mL Oral TID BM   insulin aspart  0-9 Units Subcutaneous Q4H   lidocaine  1 patch Transdermal Q24H   metoprolol tartrate  75 mg Oral BID   nicotine  21 mg Transdermal Daily   pantoprazole (PROTONIX) IV  40 mg Intravenous Q24H   pregabalin  200 mg Oral TID   saccharomyces boulardii  250 mg Oral BID   sodium chloride flush  10-40 mL Intracatheter Q12H   Continuous Infusions:  sodium chloride 10 mL/hr at 07/22/18 2300   heparin 1,200 Units/hr (07/26/18 0957)   meropenem (MERREM) IV 1 g (07/26/18 1345)   methocarbamol (ROBAXIN) IV 500 mg (07/26/18 1352)   TPN ADULT (ION) 60 mL/hr at 07/26/18 1723     LOS: 19 days   Lynden Oxford, MD [facility] Pager On Amion  If 7PM-7AM, please contact night-coverage 07/26/2018, 6:53 PM

## 2018-07-26 NOTE — Progress Notes (Signed)
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 Derrell Lollingamirez, 4/16POD #10 -TID WTD dressing changes  Expectedpost-op ileus.  - Mobilize as tolerated. IS/pulm toliet.PT evaluation yesterday SNF versus home health with PT pending progress.  FEN-soft diet/ensure supplement/nutrition consult,IVF,TPN (4/19)             -can begin TPN wean VTE -SCDs, heparindrip ID -Meropenem 4/15 >>per ID,day10;WBC26.1>>21.4,>>19.6(4/21),afebrile Foley - Removed POD 1 Follow up -Dr. Derrell Lollingamirez   LOS: 19 days  Chief Complaint/Subjective: No acute events, continued pain in abdomen, no nausea, tolerating some tea  Objective: Vital signs in last 24 hours: Temp:  [98.2 F (36.8 C)-99.1 F (37.3 C)] 98.2 F (36.8 C) (04/26 0401) Pulse Rate:  [77-78] 77 (04/25 2034) Resp:  [17-20] 17 (04/26 0401) BP: (139-156)/(56-74) 149/66 (04/26 0401) SpO2:  [96 %-98 %] 96 % (04/26 0401) Weight:  [64.2 kg] 64.2 kg (04/26 0401) Last BM Date: 07/22/18  Intake/Output from previous day: 04/25 0701 - 04/26 0700 In: 2813.1 [P.O.:240; I.V.:2109.9; IV Piggyback:463.3] Out: 2320 [Urine:2300; Drains:20] Intake/Output this shift: No intake/output data  recorded.  Lungs: nonlabored  Cardiovascular: RRR  Abd: soft, open abdomen with fibrinous base, ostomy pale with gas in the area  Extremities: no edema  Neuro: AOx3  Lab Results: CBC  Recent Labs    07/25/18 0601 07/26/18 0239  WBC 17.6* 18.8*  HGB 7.7* 7.6*  HCT 25.4* 24.8*  PLT 319 367   BMET Recent Labs    07/24/18 0347  NA 132*  K 4.3  CL 103  CO2 23  GLUCOSE 116*  BUN 12  CREATININE 0.40*  CALCIUM 7.9*   PT/INR No results for input(s): LABPROT, INR in the last 72 hours. ABG No results for input(s): PHART, HCO3 in the last 72 hours.  Invalid input(s): PCO2, PO2  Studies/Results:  Anti-infectives: Anti-infectives (From admission, onward)   Start     Dose/Rate Route Frequency Ordered Stop   07/15/18 1700  meropenem (MERREM) 1 g in sodium chloride 0.9 % 100 mL IVPB     1 g 200 mL/hr over 30 Minutes Intravenous Every 8 hours 07/15/18 1620     07/08/18 2300  vancomycin (VANCOCIN) 1,500 mg in sodium chloride 0.9 % 500 mL IVPB  Status:  Discontinued     1,500 mg 250 mL/hr over 120 Minutes Intravenous  Once 07/07/18 2128 07/07/18 2129   07/08/18 2300  vancomycin (VANCOCIN) 1,500 mg in sodium chloride 0.9 % 500 mL IVPB  Status:  Discontinued     1,500 mg 250 mL/hr over 120 Minutes Intravenous Every 24 hours 07/07/18 2129 07/08/18 0726   07/08/18 0600  ceFEPIme (MAXIPIME) 2 g in sodium chloride 0.9 % 100 mL IVPB  Status:  Discontinued     2 g 200 mL/hr over 30 Minutes Intravenous  Every 8 hours 07/07/18 2124 07/13/18 1333   07/07/18 2130  vancomycin (VANCOCIN) 1,500 mg in sodium chloride 0.9 % 500 mL IVPB     1,500 mg 250 mL/hr over 120 Minutes Intravenous  Once 07/07/18 2123 07/08/18 0028   07/07/18 2115  vancomycin (VANCOCIN) IVPB 1000 mg/200 mL premix  Status:  Discontinued     1,000 mg 200 mL/hr over 60 Minutes Intravenous  Once 07/07/18 2110 07/07/18 2128   07/07/18 2115  ceFEPIme (MAXIPIME) 2 g in sodium chloride 0.9 % 100 mL IVPB     2 g 200 mL/hr  over 30 Minutes Intravenous  Once 07/07/18 2110 07/07/18 2203      Medications: Scheduled Meds: . atorvastatin  40 mg Oral Daily  . Chlorhexidine Gluconate Cloth  6 each Topical Q0600  . feeding supplement (ENSURE ENLIVE)  237 mL Oral TID BM  . insulin aspart  0-9 Units Subcutaneous Q4H  . lidocaine  1 patch Transdermal Q24H  . metoprolol tartrate  75 mg Oral BID  . nicotine  21 mg Transdermal Daily  . pantoprazole (PROTONIX) IV  40 mg Intravenous Q24H  . pregabalin  200 mg Oral TID  . saccharomyces boulardii  250 mg Oral BID  . sodium chloride flush  10-40 mL Intracatheter Q12H   Continuous Infusions: . sodium chloride 10 mL/hr at 07/22/18 2300  . heparin 1,200 Units/hr (07/25/18 1151)  . meropenem (MERREM) IV 1 g (07/26/18 0507)  . methocarbamol (ROBAXIN) IV 500 mg (07/26/18 0540)  . TPN ADULT (ION) 60 mL/hr at 07/25/18 1729   PRN Meds:.sodium chloride, alum & mag hydroxide-simeth, hydrALAZINE, ipratropium-albuterol, labetalol, morphine injection, oxyCODONE, phenol, promethazine, sodium chloride flush  Rodman Pickle, MD [facility]

## 2018-07-26 NOTE — Progress Notes (Signed)
Patient had short run of S.T. rate 134 then back to S.R. Patient was assess by Janann August. Patient was asleep in the bed no complaints.

## 2018-07-27 ENCOUNTER — Inpatient Hospital Stay (HOSPITAL_COMMUNITY): Payer: Medicare Other

## 2018-07-27 LAB — COMPREHENSIVE METABOLIC PANEL
ALT: 130 U/L — ABNORMAL HIGH (ref 0–44)
AST: 254 U/L — ABNORMAL HIGH (ref 15–41)
Albumin: 1.3 g/dL — ABNORMAL LOW (ref 3.5–5.0)
Alkaline Phosphatase: 328 U/L — ABNORMAL HIGH (ref 38–126)
Anion gap: 6 (ref 5–15)
BUN: 11 mg/dL (ref 8–23)
CO2: 25 mmol/L (ref 22–32)
Calcium: 8.4 mg/dL — ABNORMAL LOW (ref 8.9–10.3)
Chloride: 105 mmol/L (ref 98–111)
Creatinine, Ser: 0.42 mg/dL — ABNORMAL LOW (ref 0.44–1.00)
GFR calc Af Amer: >60 mL/min (ref >60)
GFR calc non Af Amer: >60 mL/min (ref >60)
Glucose, Bld: 121 mg/dL — ABNORMAL HIGH (ref 70–99)
Potassium: 4.6 mmol/L (ref 3.5–5.1)
Sodium: 136 mmol/L (ref 135–145)
Total Bilirubin: 0.4 mg/dL (ref 0.3–1.2)
Total Protein: 6 g/dL — ABNORMAL LOW (ref 6.5–8.1)

## 2018-07-27 LAB — DIFFERENTIAL
Abs Immature Granulocytes: 0.14 10*3/uL — ABNORMAL HIGH (ref 0.00–0.07)
Basophils Absolute: 0 10*3/uL (ref 0.0–0.1)
Basophils Relative: 0 %
Eosinophils Absolute: 0.2 10*3/uL (ref 0.0–0.5)
Eosinophils Relative: 1 %
Immature Granulocytes: 1 %
Lymphocytes Relative: 18 %
Lymphs Abs: 2.9 10*3/uL (ref 0.7–4.0)
Monocytes Absolute: 1.9 10*3/uL — ABNORMAL HIGH (ref 0.1–1.0)
Monocytes Relative: 12 %
Neutro Abs: 10.8 10*3/uL — ABNORMAL HIGH (ref 1.7–7.7)
Neutrophils Relative %: 68 %

## 2018-07-27 LAB — CBC
HCT: 25.2 % — ABNORMAL LOW (ref 36.0–46.0)
Hemoglobin: 7.7 g/dL — ABNORMAL LOW (ref 12.0–15.0)
MCH: 25.1 pg — ABNORMAL LOW (ref 26.0–34.0)
MCHC: 30.6 g/dL (ref 30.0–36.0)
MCV: 82.1 fL (ref 80.0–100.0)
Platelets: 422 10*3/uL — ABNORMAL HIGH (ref 150–400)
RBC: 3.07 MIL/uL — ABNORMAL LOW (ref 3.87–5.11)
RDW: 18.5 % — ABNORMAL HIGH (ref 11.5–15.5)
WBC: 15.9 10*3/uL — ABNORMAL HIGH (ref 4.0–10.5)
nRBC: 1.2 % — ABNORMAL HIGH (ref 0.0–0.2)

## 2018-07-27 LAB — GLUCOSE, CAPILLARY
Glucose-Capillary: 107 mg/dL — ABNORMAL HIGH (ref 70–99)
Glucose-Capillary: 113 mg/dL — ABNORMAL HIGH (ref 70–99)
Glucose-Capillary: 116 mg/dL — ABNORMAL HIGH (ref 70–99)
Glucose-Capillary: 117 mg/dL — ABNORMAL HIGH (ref 70–99)
Glucose-Capillary: 122 mg/dL — ABNORMAL HIGH (ref 70–99)
Glucose-Capillary: 139 mg/dL — ABNORMAL HIGH (ref 70–99)

## 2018-07-27 LAB — PREALBUMIN: Prealbumin: 5.5 mg/dL — ABNORMAL LOW (ref 18–38)

## 2018-07-27 LAB — MAGNESIUM: Magnesium: 1.9 mg/dL (ref 1.7–2.4)

## 2018-07-27 LAB — PHOSPHORUS: Phosphorus: 3.9 mg/dL (ref 2.5–4.6)

## 2018-07-27 LAB — HEPARIN LEVEL (UNFRACTIONATED): Heparin Unfractionated: 0.37 IU/mL (ref 0.30–0.70)

## 2018-07-27 LAB — TRIGLYCERIDES: Triglycerides: 63 mg/dL (ref <150)

## 2018-07-27 MED ORDER — TRAVASOL 10 % IV SOLN
INTRAVENOUS | Status: AC
  Administered 2018-07-27: 18:00:00 via INTRAVENOUS
  Filled 2018-07-27: qty 1195.2

## 2018-07-27 MED ORDER — MAGIC MOUTHWASH
10.0000 mL | Freq: Four times a day (QID) | ORAL | Status: DC
  Administered 2018-07-31 – 2018-08-06 (×4): 10 mL via ORAL
  Filled 2018-07-27 (×52): qty 10

## 2018-07-27 MED ORDER — PREGABALIN 100 MG PO CAPS
100.0000 mg | ORAL_CAPSULE | Freq: Three times a day (TID) | ORAL | Status: DC
  Administered 2018-07-27 – 2018-07-28 (×3): 100 mg via ORAL
  Filled 2018-07-27 (×4): qty 1

## 2018-07-27 MED ORDER — FLUCONAZOLE IN SODIUM CHLORIDE 200-0.9 MG/100ML-% IV SOLN
200.0000 mg | Freq: Once | INTRAVENOUS | Status: AC
  Administered 2018-07-27: 200 mg via INTRAVENOUS
  Filled 2018-07-27: qty 100

## 2018-07-27 MED ORDER — FLUCONAZOLE 100 MG PO TABS
100.0000 mg | ORAL_TABLET | Freq: Every day | ORAL | Status: AC
  Administered 2018-07-28 – 2018-08-02 (×6): 100 mg via ORAL
  Filled 2018-07-27 (×6): qty 1

## 2018-07-27 MED ORDER — IOHEXOL 300 MG/ML  SOLN
150.0000 mL | Freq: Once | INTRAMUSCULAR | Status: AC | PRN
  Administered 2018-07-27: 25 mL via ORAL

## 2018-07-27 MED ORDER — DOCUSATE SODIUM 100 MG PO CAPS
100.0000 mg | ORAL_CAPSULE | Freq: Two times a day (BID) | ORAL | Status: DC
  Administered 2018-07-27 – 2018-08-08 (×23): 100 mg via ORAL
  Filled 2018-07-27 (×24): qty 1

## 2018-07-27 NOTE — Progress Notes (Signed)
ANTICOAGULATION CONSULT NOTE - Follow Up Consult  Pharmacy Consult for Heparin + Merrem Indication: Afib + colon perforation  No Known Allergies  Patient Measurements: Height: 5\' 7"  (170.2 cm) Weight: 141 lb 8.6 oz (64.2 kg) IBW/kg (Calculated) : 61.6 Heparin Dosing Weight: 64.2 kg  Vital Signs: Temp: 98.7 F (37.1 C) (04/27 0425) Temp Source: Oral (04/27 0425) BP: 142/53 (04/27 0425) Pulse Rate: 76 (04/27 0425)  Labs: Recent Labs    07/25/18 0601 07/26/18 0239 07/26/18 0904 07/27/18 0458  HGB 7.7* 7.6*  --  7.7*  HCT 25.4* 24.8*  --  25.2*  PLT 319 367  --  422*  HEPARINUNFRC 0.43 0.41  --  0.37  CREATININE  --   --  0.41* 0.42*    Estimated Creatinine Clearance: 62.7 mL/min (A) (by C-G formula based on SCr of 0.42 mg/dL (L)).   Assessment:  Anticoag: Eliquis PTA>Heparin. New afib, unclear when the DOAC started PTA but not at the last clinic visit. S/p GI procedure and left abd abscess drain placement 4/16. Heparin resumed 4/17. - HL 0.37 at goal. Hgb 7.7 low but stable. Plts 422 ok.   ID: [id number]  Meropenem for IA perf 4/15. S/p 7 days cefepime for PNA/sepsis.   WBC 18.8>15.9, afeb, SCr wnl  Cefepime 4/7 >> 4/13 Vanco 4/7 >> 4/8 Meropenem 4/15>>  4/7 BCx x 2: neg 4/8 mrsa pcr negative   Goal of Therapy:  Heparin level 0.3-0.7 units/ml Monitor platelets by anticoagulation protocol: Yes   Plan:  Heparin 1200 units/hr Daily HL and CBC Merrem 1g IV q 8hrs   Roxy Mastandrea S. Merilynn Finland, PharmD, BCPS Clinical Staff Pharmacist Misty Stanley Stillinger 07/27/2018,9:01 AM

## 2018-07-27 NOTE — Progress Notes (Signed)
Changes in wound, appearance of black on tongue , refusal to eat, and lethargy reported to IM.  New orders received.

## 2018-07-27 NOTE — Progress Notes (Signed)
PHARMACY - ADULT TOTAL PARENTERAL NUTRITION CONSULT NOTE   Pharmacy Consult for TPN Indication: Post-op ileus  Patient Measurements: Height: 5' 7"  (170.2 cm) Weight: 141 lb 8.6 oz (64.2 kg) IBW/kg (Calculated) : 61.6 TPN AdjBW (KG): 62.3 Body mass index is 22.17 kg/m.  Assessment: 7-yo female with h/o chronic N/V/abdominal pain, A. fib, PVD status post right femoral thrombectomy on 05/31/2018, hypertension and chronic back pain presenting with intractable nausea, vomiting, abdominal pain, new onset of diarrhea and about 30 pounds weight loss in the last several months.   She was admitted to [facility] with a diagnosis of sepsis. 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 Also there is a 3.9 x 1.8 cm and 2.6 x 1.1 cm fluid collection in right pelvis/groin thought to be seroma from prior surgery. 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   Patient was transferred to [facility] for further management. S/p upper endoscopy 07/14/18 found Severe ulcerative gastritis most consistent ischemia, biopsied, results pending. CT showed perforation of descending colon. 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 Now with post-op ileus in the setting of 30 pound weight loss and minimal intake since admission.   GI: Open abdomen. Small abscesses on CT. 2 x JP drains with 5 mls output, nothing from colostomy, LBM 4/22-adding Colace; Prealbumin >5 to 5.5. NGT pulled out 4/17- no plan to replace at this time. Patient reports continued nausea  and states emesis after drinking ~1/2 of Ensures (no emesis charted). Pain with eating due to tongue sores (not yeast per Surgery). Discussed case with Ferdinand Cava, PA -- considering NGT again, ok to go back to full TPN.  Endo: No history of diabetes; CBGs <180 Insulin requirements in the past 24 hours: 1 unit Lytes: Corr Ca elevated at 10.6 (Alb 1.3); K & Phos wnl, Mg remains 1.9 (goal >2) Renal: Scr 0.42; BUN 11; UOP 0.7 ml/kg/hr + 2 unmeasured occurrences, net +4.8L Pulm: RA Cards: SBP in 140-150s; HR 70s on metoprolol and heparin drip Hepatobil:AST/ALT 254/130 and Alk Phos 328 (rising again); TBili 0.4; TGs 63 Neuro: No complaints. Pain fairly controlled. ID: WBC down 15.9; afebrile; Meropenem 4/15>>  TPN Access: PICC requested for today (4/18) TPN start date: 4/18 Nutritional Goals (per RD recommendation on 4/24): Kcal:  2000-2200 kcals (32-36 kcal/kg bw) Protein:  112-125g Pro  (1.8-2g/kg bw) Fluid:  2-2.2 L (26m/kcal)  Goal TPN rate is 83 ml/hr (This TPN will provide 120 g of protein, 269 g of dextrose, and 67 g of lipids which provides 2003  kCals per day, meeting 100% of patient needs)  Current Nutrition:  Soft diet --poor appetite, 0-10% intake Ensure Enlive 237 mL po TID - Patient reporting only taking 1/2 of each Ensure but then throwing it up because it makes her nauseous although no emesis documented (each provides 350 kcal and 20 g protein) TPN at 60 ml/hr  Plan:  Increase TPN back to goal rate of 83 ml/hr (Surgery requesting weaning over weekend per progress note and ID requesting wean but patient continues to not  meet nutritional goals enterally) This TPN will provide 120 g of protein, 269 g of dextrose, and 67 g of lipids which provides 2003  kCals per day, meeting 100% of patient needs Electrolytes in TPN: Standard except increase Na and increase Mg Add MVI to TPN - trace elements are on back order, will only place in TPN on Monday, Wednesday and Friday Continue  sensitive SSI and adjust as needed Add Thiamine and Folate toTPN Monitor TPN labs Mon and Thurs.  F/u daily BMP as ordered by MD F/u enteral intake and ability to advance diet as able - would like for this patient to be able to stop TPN with ongoing infectious process but unable to do so at this time.  Sloan Leiter, PharmD, BCPS, BCCCP Clinical Pharmacist **Pharmacist phone directory can now be found on [email or web address] listed under [place]** 07/27/2018 7:15 AM

## 2018-07-27 NOTE — Progress Notes (Signed)
11 Days Post-Op  Subjective: Patient complains of pain on her tongue with some ulcerations.  + flatus, no BMs.  No nausea/vomiting.  Sitting up in chair.  Ambulating with PT.  Poor appetite and pain with eating due to tongue issues.  Objective: Vital signs in last 24 hours: Temp:  [98.7 F (37.1 C)-98.9 F (37.2 C)] 98.7 F (37.1 C) (04/27 0425) Pulse Rate:  [75-78] 76 (04/27 0425) Resp:  [15-20] 15 (04/27 0425) BP: (137-163)/(53-86) 142/53 (04/27 0425) SpO2:  [94 %-98 %] 94 % (04/27 0425) Last BM Date: 07/22/18  Intake/Output from previous day: 04/26 0701 - 04/27 0700 In: 1718.8 [P.O.:430; I.V.:837.5; IV Piggyback:451.3] Out: 1055 [Urine:1050; Drains:5] Intake/Output this shift: No intake/output data recorded.  PE: Mouth: tongue is dry with some sores present with dry blood.  No evidence of yeast Heart: regular Lungs: CTAB, pulls 1500+ on IS Abd: soft, appropriately tender, drain 1/2 with minimal output.  Drain 1 is slightly cloudy.  Drain 2 is serosang.  Colostomy is pink and viable.  Air in bag.  No stool.  Midline wound with necrotic fibrin at the base with some fascial dehiscence.     Lab Results:  Recent Labs    07/26/18 0239 07/27/18 0458  WBC 18.8* 15.9*  HGB 7.6* 7.7*  HCT 24.8* 25.2*  PLT 367 422*   BMET Recent Labs    07/26/18 0904 07/27/18 0458  NA 133* 136  K 4.6 4.6  CL 101 105  CO2 23 25  GLUCOSE 129* 121*  BUN 11 11  CREATININE 0.41* 0.42*  CALCIUM 8.5* 8.4*   PT/INR No results for input(s): LABPROT, INR in the last 72 hours. CMP     Component Value Date/Time   NA 136 07/27/2018 0458   NA 142 05/22/2018 1039   K 4.6 07/27/2018 0458   CL 105 07/27/2018 0458   CO2 25 07/27/2018 0458   GLUCOSE 121 (H) 07/27/2018 0458   BUN 11 07/27/2018 0458   BUN 10 05/22/2018 1039   CREATININE 0.42 (L) 07/27/2018 0458   CREATININE 0.72 08/07/2012 1016   CALCIUM 8.4 (L) 07/27/2018 0458   PROT 6.0 (L) 07/27/2018 0458   PROT 6.8 05/22/2018  1039   ALBUMIN 1.3 (L) 07/27/2018 0458   ALBUMIN 3.6 (L) 05/22/2018 1039   AST 254 (H) 07/27/2018 0458   ALT 130 (H) 07/27/2018 0458   ALKPHOS 328 (H) 07/27/2018 0458   BILITOT 0.4 07/27/2018 0458   BILITOT 0.3 05/22/2018 1039   GFRNONAA >60 07/27/2018 0458   GFRNONAA 88 08/07/2012 1016   GFRAA >60 07/27/2018 0458   GFRAA >89 08/07/2012 1016   Lipase     Component Value Date/Time   LIPASE 65 (H) 07/08/2018 1325       Studies/Results: No results found.  Anti-infectives: Anti-infectives (From admission, onward)   Start     Dose/Rate Route Frequency Ordered Stop   07/15/18 1700  meropenem (MERREM) 1 g in sodium chloride 0.9 % 100 mL IVPB     1 g 200 mL/hr over 30 Minutes Intravenous Every 8 hours 07/15/18 1620     07/08/18 2300  vancomycin (VANCOCIN) 1,500 mg in sodium chloride 0.9 % 500 mL IVPB  Status:  Discontinued     1,500 mg 250 mL/hr over 120 Minutes Intravenous  Once 07/07/18 2128 07/07/18 2129   07/08/18 2300  vancomycin (VANCOCIN) 1,500 mg in sodium chloride 0.9 % 500 mL IVPB  Status:  Discontinued     1,500 mg 250 mL/hr over  120 Minutes Intravenous Every 24 hours 07/07/18 2129 07/08/18 0726   07/08/18 0600  ceFEPIme (MAXIPIME) 2 g in sodium chloride 0.9 % 100 mL IVPB  Status:  Discontinued     2 g 200 mL/hr over 30 Minutes Intravenous Every 8 hours 07/07/18 2124 07/13/18 1333   07/07/18 2130  vancomycin (VANCOCIN) 1,500 mg in sodium chloride 0.9 % 500 mL IVPB     1,500 mg 250 mL/hr over 120 Minutes Intravenous  Once 07/07/18 2123 07/08/18 0028   07/07/18 2115  vancomycin (VANCOCIN) IVPB 1000 mg/200 mL premix  Status:  Discontinued     1,000 mg 200 mL/hr over 60 Minutes Intravenous  Once 07/07/18 2110 07/07/18 2128   07/07/18 2115  ceFEPIme (MAXIPIME) 2 g in sodium chloride 0.9 % 100 mL IVPB     2 g 200 mL/hr over 30 Minutes Intravenous  Once 07/07/18 2110 07/07/18 2203       Assessment/Plan A. 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 Derrell Lollingamirez, 4/16POD #11 - TID WTD dressing changes to help with necrotic tissue at base of wound -cont on soft diet -colostomy with air present, no stool yet.  Cont soft diet -add colace BID, may need to digitize colostomy if no feculent output soon -mobilize and pulm toilet (1500) -[facility]  FEN-soft diet/ensure,IVF,TPN, colace VTE -SCDs, heparindrip ID -Meropenem 4/15 >> Foley - Removed POD 1 Follow up -Dr. Derrell Lollingamirez POC: Dellis Filbertammy Kallam [phone] daughter/[name] [phone]. Spoke with [name] for 15 minutes.    LOS: 20 days    Letha CapeKelly E Randall Colden , [facility] Center[facility]C [facility] 07/27/2018, 8:02 AM Pager: [phone]

## 2018-07-27 NOTE — Consult Note (Addendum)
WOC Nurse ostomy consultnote Surgical team following for assessment and plan of care for abd wound.   Stoma type/location:RUQ, descending colostomy Stomal assessment/size:1 3/4 inches, red and viable, slightly above skin level Peristomal assessment:intact skin surrounding Output: small amt tan liquid stool Ostomy pouching: 2pc. 2 3/4" system  Education provided: Demonstrated pouch change using 2 piece pouch.  Pt did not watch or participate and will need total assistance with pouch application and emptying after discharge.   Education booklet in room and supplies ordered to the bedside.  Enrolled patient in [employer] DC program: Yes, previously. WOC team will continue to perform teaching sessions while in the hospital.  Cammie Mcgee MSN, RN, [place], [place], CNS [phone]

## 2018-07-27 NOTE — Progress Notes (Signed)
[facility]  Subjective  - still some nausea with certain foods "ranch dressing"   Objective (!) 142/53 76 98.7 F (37.1 C) (Oral) 15 94%  Intake/Output Summary (Last 24 hours) at 07/27/2018 1154 Last data filed at 07/27/2018 0900 Gross per 24 hour  Intake 1384.87 ml  Output 1105 ml  Net 279.87 ml    Voice hoarseness pt states present for one week Abdomen soft slightly distended Left foot dry gangrene 2 toes, no pain (This has been present and stable since 3/20 per Dr Darcella Cheshire note)  Assessment/Planning: 1. Mesenteric angio at some point pt continues to be very weak with poor nutrition agree with TPN. Doubt she will be ready for this until next week.  Will recheck later this week 2. Gangrene toes stable but could eval at time of mesenteric angio.  Fabienne Bruns 07/27/2018 11:54 AM --  Laboratory Lab Results: Recent Labs    07/26/18 0239 07/27/18 0458  WBC 18.8* 15.9*  HGB 7.6* 7.7*  HCT 24.8* 25.2*  PLT 367 422*   BMET Recent Labs    07/26/18 0904 07/27/18 0458  NA 133* 136  K 4.6 4.6  CL 101 105  CO2 23 25  GLUCOSE 129* 121*  BUN 11 11  CREATININE 0.41* 0.42*  CALCIUM 8.5* 8.4*    COAG Lab Results  Component Value Date   INR 1.3 (H) 07/10/2018   INR 1.7 (H) 07/07/2018   INR 1.05 05/17/2009   No results found for: PTT

## 2018-07-27 NOTE — Progress Notes (Signed)
Physical Therapy Treatment Patient Details Name: [name] MRN: [id number] DOB: 03/11/1946 Today's Date: 07/27/2018    History of Present Illness  [name] is a 73 y.o. female with medical history significant of chronic back pain, GERD, hyperlipidemia, hypertension, left knee osteoarthritis, palpitations, PVD h/o Aortofem bypass, s/p RLE thromboembolectomy and R common emoral artery patch angioplasty 05/31/18, history of tobacco use who presents to ER due to fall at home.  patient now s/p EXPLORATORY LAPAROTOMY, lysis of adhesions X 45 minutes LEFT SIGMOID COLECTOMY, SPLENIC FLEXOR MOBILIZATION,DESCENDING COLOSTOMY on 4/16.    PT Comments    Pt admitted with above diagnosis. Pt currently with functional limitations due to the deficits listed below (see PT Problem List). Pt refused to ambulate today or get OOB.  She did agree to exercise with PT.  Pt difficult to engage.  Nurse is aware and has called MD.   Pt will benefit from skilled PT to increase their independence and safety with mobility to allow discharge to the venue listed below.     Follow Up Recommendations  SNF(HHPT if progresses well )     Equipment Recommendations  Other (comment)(TBD )    Recommendations for Other Services       Precautions / Restrictions Precautions Precautions: Fall Restrictions Weight Bearing Restrictions: No    Mobility  Bed Mobility               General bed mobility comments: Pt refused to get up adamantly.  Agreed to exercise only.   Transfers                    Ambulation/Gait                 Stairs             Wheelchair Mobility    Modified Rankin (Stroke Patients Only)       Balance                                            Cognition Arousal/Alertness: Awake/alert Behavior During Therapy: WFL for tasks assessed/performed Overall Cognitive Status: Impaired/Different from baseline                                 General Comments: patient unable to follow cues well, reporting high pain      Exercises General Exercises - Upper Extremity Shoulder Flexion: AROM;Both;10 reps;Supine Shoulder Horizontal ADduction: AROM;Both;5 reps;Supine Elbow Flexion: AROM;Both;10 reps;Supine General Exercises - Lower Extremity Ankle Circles/Pumps: AROM;10 reps;Supine;Both Quad Sets: AROM;Both;5 reps;Supine Heel Slides: Both;10 reps;Supine;AAROM    General Comments        Pertinent Vitals/Pain Pain Assessment: Faces Faces Pain Scale: Hurts whole lot Pain Location: stomach Pain Descriptors / Indicators: Burning;Discomfort Pain Intervention(s): Limited activity within patient's tolerance;Monitored during session;Premedicated before session;Repositioned    Home Living                      Prior Function            PT Goals (current goals can now be found in the care plan section) Acute Rehab PT Goals Patient Stated Goal: return home with family to assist Progress towards PT goals: Progressing toward goals    Frequency    Min 2X/week  PT Plan Current plan remains appropriate;Frequency needs to be updated    Co-evaluation              AM-PAC PT "6 Clicks" Mobility   Outcome Measure  Help needed turning from your back to your side while in a flat bed without using bedrails?: A Little Help needed moving from lying on your back to sitting on the side of a flat bed without using bedrails?: A Little Help needed moving to and from a bed to a chair (including a wheelchair)?: A Little Help needed standing up from a chair using your arms (e.g., wheelchair or bedside chair)?: A Little Help needed to walk in hospital room?: A Little Help needed climbing 3-5 steps with a railing? : A Lot 6 Click Score: 17    End of Session Equipment Utilized During Treatment: Oxygen Activity Tolerance: Patient limited by pain;Patient limited by fatigue Patient left: in bed;with call  bell/phone within reach;with bed alarm set(refused to get OOB) Nurse Communication: Mobility status PT Visit Diagnosis: Unsteadiness on feet (R26.81);Other abnormalities of gait and mobility (R26.89);Difficulty in walking, not elsewhere classified (R26.2)     Time: 4035-2481 PT Time Calculation (min) (ACUTE ONLY): 10 min  Charges:  $Therapeutic Exercise: 8-22 mins                     Eartha Vonbehren,PT Acute Rehabilitation Services Pager:  [phone]  Office:  [phone]     Berline Lopes 07/27/2018, 2:59 PM

## 2018-07-27 NOTE — Progress Notes (Signed)
Transport to take patient to radiology stated that patient refused to go. Went in to speak to patient. Stated that she doesn't want to go. "I'm tired and not going." Tried to talk to patient about the importance of test; however she is still adamantly refusing. Informed primary nurse of refusal.

## 2018-07-27 NOTE — Progress Notes (Signed)
PROGRESS NOTE    [name]  IHK:[id number] DOB: September 23, 1945 DOA: 07/07/2018 PCP: Junie Spencer, [name]    Brief Narrative:  73 year old female with history of chronic N/V/abdominal pain, A. fib, PVD status post right femoral thrombectomy on 05/31/2018, hypertension and chronic back pain presenting with intractable nausea, vomiting, abdominal pain, new onset of diarrhea and about 30 pounds weight loss in the last several months.  Admitted to [facility]  on 07/07/2018 with somnolence and a diagnosis of sepsis. She has had a prior aortobifem by Dr Hart Rochester in 2002 and had thrombectomy of the right limb of this by Dr Randie Heinz 3/20. 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 Her left internal iliac artery was patent at that point.   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 Also there is a 3.9 x 1.8 cm and 2.6 x 1.1 cm fluid collection in right pelvis/groin thought to be seroma from prior surgery.   Per H&P, interventional radiology at [facility] was consulted and consideration was being given for stenting her SMA which has about a (30-50% stenosis) to improve mesenteric blood flow.   Patient was transferred to [facility] for further management.  Patient was also treated with cefepime for presumed pneumonia.  Gastroenterology and vascular surgery consulted.  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  Biopsies taken.  She also had flexible sigmoidoscopy that showed only a 7 mm sessile polyp in the rectum but poor prep.  On 4/15, and  continued to have significant leukocytosis, mild temperature and abdominal tenderness.  This raised concern about intra-abdominal occult infection especially with the fluid collections noted on CT abdomen.  Infectious disease consulted and started patient on meropenem.  IR consulted and recommended getting repeat CT abdomen and pelvis which showed frank perforation of the descending colon appears to be contained within a portion of the omentum, or occluded celiac axis, slightly decreased pancreatic tail lesion but no previously noted fluid collections.  General surgery and IR consulted.  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 Derrell Lolling on 4/16.  Abdomen left open out of concern for infectious process and ileus. Started on TPN on 07/18/2018.  Continues to have minimal oral intake.  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 Derrell Lolling, 4/16 Postop ileus -Abdomen remains open -Bowel sounds present but no output in ostomy bag. -IR and general surgery following -VSS on board as well-plan angiogram for SMA stenosis before discharge -Pain control per surgery-PRN morphine, IV Tylenol and Robaxin. -Hemoglobin stable, mild worsening of Leukocytosis -TPN feeding started 4/18. Appreciate surgical assistance in weaning the TPN. -Pharmacy to manage electrolyte abnormalities with TPN. -Continue meropenem 4/15--per ID.   For now total 4 weeks therapy recommended by ID. -Appreciate PT. Encourage incentive spirometry. -Continue zofran as needed. -Appreciate general surgery assistance.  CT abdomen showed some small pockets of abscess as well as contained air with surgery thinks is postprocedure.  Does not think the patient  actually has a new rupture.  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 -Likely from occluded celiac axis -Continue PPI -Vascular surgery following per above  Significant weight loss/moderate protein calorie malnutrition: Likely due to poor p.o. intake from the above. -Currently awaiting bowel function to feed. -TPN as tolerated -Colon biopsy from the surgery was negative for any malignancy, biopsy from body of stomach were suggestive of intestinal metaplasia but no malignancy.  History of COPD: Stable -Continue DuoNeb as needed   Peripheral vascular disease with dry gangrene: Stable. -Continue home statin -Outpatient vascular surgery follow-up -On therapeutic anticoagulation with IV heparin per pharmacy  Essential hypertension: Fairly controlled. -Continue home meds  Chronic atrial fibrillation: Not in RVR.   Chad2vasc score 6. -On Eliquis and metoprolol at home. -Continued on Heparin and metoprolol -Remains rate controlled  Chronic pain: -Pain medications as above -Continue Lyrica reduce the dose from 200 mg 100 mg 3 times daily, low threshold to reduce the dose if the patient remains sleepy and confused  Tobacco use disorder: -Cessation counseling -Continue Nicotine patch. Stable  Oral thrush. We will treat with Diflucan.   DVT prophylaxis: Heparin gtt Code Status: Full Family Communication: Pt in room, family not at bedside, discussed with daughter on phone on 07/23/2018. Disposition Plan: Uncertain at this time  Consultants:   Vascular surgery  Gastroenterology  Infectious disease  Interventional radiology  General surgery  Procedures:   EGD on 4/14  Flex sigmoidoscopy on 4/14  IR drain placement on 4/16 which showed a large left colon defect, 4/16  Ex Lap, LOA, Left sigmoid colectomy, descending colostomy (Hartmann's), Dr. Derrell Lolling, 4/16  Antimicrobials: Anti-infectives  (From admission, onward)   Start     Dose/Rate Route Frequency Ordered Stop   07/28/18 1000  fluconazole (DIFLUCAN) tablet 100 mg     100 mg Oral Daily 07/27/18 1219 08/04/18 0959   07/27/18 1400  fluconazole (DIFLUCAN) IVPB 200 mg     200 mg 100 mL/hr over 60 Minutes Intravenous  Once 07/27/18 1219 07/27/18 1426   07/15/18 1700  meropenem (MERREM) 1 g in sodium chloride 0.9 % 100 mL IVPB     1 g 200 mL/hr over 30 Minutes Intravenous Every 8 hours 07/15/18 1620     07/08/18 2300  vancomycin (VANCOCIN) 1,500 mg in sodium chloride 0.9 % 500 mL IVPB  Status:  Discontinued     1,500 mg 250 mL/hr over 120 Minutes Intravenous  Once 07/07/18 2128 07/07/18 2129   07/08/18 2300  vancomycin (VANCOCIN) 1,500 mg in sodium chloride 0.9 % 500 mL IVPB  Status:  Discontinued     1,500 mg 250 mL/hr over 120 Minutes Intravenous Every 24 hours 07/07/18 2129 07/08/18 0726   07/08/18 0600  ceFEPIme (MAXIPIME) 2 g in sodium chloride 0.9 % 100 mL IVPB  Status:  Discontinued     2 g 200 mL/hr over 30 Minutes Intravenous Every 8 hours 07/07/18 2124 07/13/18 1333   07/07/18 2130  vancomycin (VANCOCIN) 1,500 mg in sodium chloride 0.9 % 500 mL IVPB     1,500 mg 250 mL/hr over 120 Minutes Intravenous  Once 07/07/18 2123 07/08/18 0028   07/07/18 2115  vancomycin (VANCOCIN) IVPB 1000 mg/200 mL premix  Status:  Discontinued     1,000 mg 200 mL/hr over 60 Minutes Intravenous  Once 07/07/18 2110 07/07/18 2128   07/07/18 2115  ceFEPIme (MAXIPIME) 2 g in sodium chloride 0.9 % 100 mL IVPB     2 g 200 mL/hr over 30 Minutes Intravenous  Once 07/07/18 2110 07/07/18 2203      Subjective: No acute complaint. No acute event overnight.  Objective: Vitals:   07/26/18 2023 07/27/18 0425 07/27/18 1226 07/27/18 1229  BP: (!) 146/65 (!) 142/53 131/63 131/63  Pulse: 75 76 75 74  Resp: 20 15  18   Temp: 98.9 F (37.2 C) 98.7 F (37.1 C)  98.7 F (37.1 C)  TempSrc: Oral Oral  Oral  SpO2: 97% 94%  99%  Weight:      Height:         Intake/Output Summary (Last 24 hours) at 07/27/2018 1959 Last data filed at 07/27/2018 1314 Gross per 24 hour  Intake 480 ml  Output 55 ml  Net 425 ml   Filed Weights   07/24/18 0517 07/25/18 0605 07/26/18 0401  Weight: 63.4 kg 63.4 kg 64.2 kg    Examination:  General exam: Appears calm and comfortable  Respiratory system: Clear to auscultation. Respiratory effort normal. Cardiovascular system: S1 & S2 heard, RRR Gastrointestinal system: Soft, nondistended, mildly tender Central nervous system: Alert and oriented. No focal neurological deficits. Extremities: Symmetric 5 x 5 power. Skin: No rashes, lesions Psychiatry: Judgement and insight appear normal. Mood & affect appropriate.      Data Reviewed: I have personally reviewed following labs and imaging studies  CBC: Recent Labs  Lab 07/23/18 0548 07/24/18 0347 07/25/18 0601 07/26/18 0239 07/27/18 0458  WBC 19.3* 19.3* 17.6* 18.8* 15.9*  NEUTROABS  --   --   --   --  10.8*  HGB 8.2* 7.9* 7.7* 7.6* 7.7*  HCT 26.5* 25.8* 25.4* 24.8* 25.2*  MCV 82.6 81.1 81.2 81.3 82.1  PLT 299 357 319 367 422*   Basic Metabolic Panel: Recent Labs  Lab 07/21/18 0541 07/22/18 0643 07/23/18 0548 07/24/18 0347 07/26/18 0904 07/27/18 0458  NA 134* 133* 133* 132* 133* 136  K 4.5 4.3 4.5 4.3 4.6 4.6  CL 103 103 103 103 101 105  CO2 23 23 22 23 23 25   GLUCOSE 104* 106* 130* 116* 129* 121*  BUN 13 13 13 12 11 11   CREATININE 0.34* 0.35* 0.36* 0.40* 0.41* 0.42*  CALCIUM 8.1* 8.0* 8.1* 7.9* 8.5* 8.4*  MG 1.9  --  1.7 1.9  --  1.9  PHOS 2.9  --  3.1  --   --  3.9   GFR: Estimated Creatinine Clearance: 62.7 mL/min (A) (by C-G formula based on SCr of 0.42 mg/dL (L)). Liver Function Tests: Recent Labs  Lab 07/23/18 0548 07/27/18 0458  AST 69* 254*  ALT 35 130*  ALKPHOS 267* 328*  BILITOT 0.6 0.4  PROT 5.1* 6.0*  ALBUMIN 1.3* 1.3*   No results for input(s): LIPASE, AMYLASE in the last 168 hours. No results for  input(s): AMMONIA in the last 168 hours. Coagulation Profile: No results for input(s): INR, PROTIME in the last 168 hours. Cardiac Enzymes: No results for input(s): CKTOTAL, CKMB, CKMBINDEX, TROPONINI in the last 168 hours. BNP (last 3 results) No results for input(s): PROBNP in the last 8760 hours. HbA1C: No results for input(s): HGBA1C in the last 72 hours. CBG: Recent Labs  Lab 07/27/18 0005 07/27/18 0424 07/27/18 0859 07/27/18 1139 07/27/18 1628  GLUCAP 122* 113* 117* 139* 107*   Lipid Profile: Recent Labs    07/27/18 0458  TRIG 63   Thyroid Function Tests: No results for input(s): TSH, T4TOTAL, FREET4, T3FREE, THYROIDAB in the last 72 hours. Anemia Panel: No results for input(s): VITAMINB12, FOLATE, FERRITIN, TIBC, IRON, RETICCTPCT in the last 72 hours. Sepsis Labs: No results for input(s): PROCALCITON, LATICACIDVEN in the last 168 hours.  No results found for this or any previous visit (from the past 240 hour(s)).   Radiology Studies: Dg Ugi W Single Cm (sol Or Thin Ba)  Result Date: 07/27/2018 CLINICAL DATA:  Apparent defect in the gastric wall on CT scan of 07/23/2018. EXAM: WATER SOLUBLE UPPER GI SERIES TECHNIQUE: Single-column upper GI series was performed using water soluble contrast. CONTRAST:  25mL OMNIPAQUE IOHEXOL 300 MG/ML  SOLN COMPARISON:  CT scan 07/23/2018 FLUOROSCOPY TIME:  Fluoroscopy Time:  3 minutes and 18 seconds. Radiation Exposure Index (if provided by the fluoroscopic device): 91 mGy Number of Acquired Spot Images: FINDINGS: The patient was reluctant to drink the water-soluble contrast. After coaching, encouraging, and pleading with the patient, she refused to drink a sufficient quantity to obtain a diagnostic study. We repositioned the patient as much as possible in attempts to move the contrast material to the area of abnormal gastric wall seen on CT, but this was not possible given the location of the apparent mural defect. IMPRESSION: Nondiagnostic  study. Careful review of the CT scan from 07/23/2018 shows an apparent mural defect in the cranial wall of the mid stomach, adjacent to where the drain passes over the top of the stomach. As noted above, the patient was unwilling to consume enough contrast to fill the stomach sufficiently to assess this portion of the gastric wall. Despite concerted effort with repositioning, we were unable to move the contrast pool to area of concern. CT scan of the abdomen may help if water soluble contrast administered during the study can be demonstrated outside of the stomach. Electronically Signed   By: Kennith CenterEric  Mansell M.D.   On: 07/27/2018 16:28    Scheduled Meds:  Chlorhexidine Gluconate Cloth  6 each Topical Q0600   docusate sodium  100 mg Oral BID   feeding supplement (ENSURE ENLIVE)  237 mL Oral TID BM   [START ON 07/28/2018] fluconazole  100 mg Oral Daily   insulin aspart  0-9 Units Subcutaneous Q4H   lidocaine  1 patch Transdermal Q24H   magic mouthwash  10 mL Oral QID   metoprolol tartrate  75 mg Oral BID   nicotine  21 mg Transdermal Daily   pantoprazole (PROTONIX) IV  40 mg Intravenous Q24H   pregabalin  100 mg Oral TID   saccharomyces boulardii  250 mg Oral BID   sodium chloride flush  10-40 mL Intracatheter Q12H   Continuous Infusions:  sodium chloride 10 mL/hr at 07/22/18 2300   heparin 1,200 Units/hr (07/27/18 0556)   meropenem (MERREM) IV 1 g (07/27/18 1442)   methocarbamol (ROBAXIN) IV 500 mg (07/27/18 1641)   TPN ADULT (ION) 83 mL/hr at 07/27/18 1742     LOS: 20 days   Lynden OxfordPranav Candela Krul, MD [facility] Pager On Amion  If 7PM-7AM, please contact night-coverage 07/27/2018, 7:59 PM

## 2018-07-27 NOTE — Progress Notes (Deleted)
PROGRESS NOTE    [name]  NUU:[id number] DOB: [birth year] DOA: 07/07/2018 PCP: Danielle SpencerHawks, [name], [name]    Brief Narrative:  73 year old female with history of chronic N/V/abdominal pain, Grant. fib, PVD status post right femoral thrombectomy on 05/31/2018, hypertension and chronic back pain presenting with intractable nausea, vomiting, abdominal pain, new onset of diarrhea and about 30 pounds weight loss in the last several months.  [facility]  on 07/07/2018 with somnolence and Grant diagnosis of sepsis. She has had Grant prior aortobifem by Dr Hart RochesterLawson in 2002 and had thrombectomy of the right limb of this by Dr Randie Heinzain 3/20. 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 Her left internal iliac artery was patent at that point.   CTA abdomen and pelvis on 4/9 showed occlusion of celiac axis, or opacification of SMA and right iliac limb (occluded on prior imaging), patent SMV, colonic wall thickenings, rim enhancing 2.5 x 3.3 cm fluid collection around pancreatic tail continuous with Grant 3.1 x 1.0 cm rim-enhancing fluid collection along the anterior margin of the upper pole of left kidney. Also there is Grant 3.9 x 1.8 cm and 2.6 x 1.1 cm fluid collection in right pelvis/groin thought to be seroma from prior surgery.   Per H&P, [facility] radiology at [facility] was consulted and consideration was being given for stenting her SMA which has about Grant (30-50% stenosis) to improve mesenteric blood flow.   Patient was transferred to [facility] for [facility] management.  Patient was also treated with cefepime for presumed pneumonia.  Gastroenterology and vascular surgery consulted.  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  Biopsies taken.  She also had flexible sigmoidoscopy that showed only Grant 7 mm sessile polyp in the rectum but poor prep.  On 4/15, and  continued to have significant leukocytosis, mild temperature and abdominal tenderness.  This raised concern about intra-abdominal occult infection especially with the fluid collections noted on CT abdomen.  Infectious disease consulted and started patient on meropenem.  IR consulted and recommended getting repeat CT abdomen and pelvis which showed frank perforation of the descending colon appears to be contained within Grant portion of the omentum, or occluded celiac axis, slightly decreased pancreatic tail lesion but no previously noted fluid collections.  General surgery and IR consulted.  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 Derrell Lollingamirez on 4/16.  Abdomen left open out of concern for infectious process and ileus. Started on TPN on 07/18/2018.  Continues to have minimal oral intake.  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 Derrell Lollingamirez, 4/16 Postop ileus -Abdomen remains open -Bowel sounds present but no output in ostomy bag. -IR and general surgery following -VSS on board as well-plan angiogram for SMA stenosis before discharge -Pain control per surgery-PRN morphine, IV Tylenol and Robaxin. -Hemoglobin stable, mild worsening of Leukocytosis -TPN feeding started 4/18. Appreciate surgical assistance in weaning the TPN. -Pharmacy to manage electrolyte abnormalities with TPN. -Continue meropenem 4/15--per ID.   For now no end date recommended. -Appreciate PT. Encourage incentive spirometry. -Continue zofran as needed. -Appreciate general surgery assistance.  CT abdomen  showed some small pockets of abscess as well as contained air with surgery thinks is postprocedure.  Does not think the patient actually has Grant new  rupture.  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 -Likely from occluded celiac axis -Continue PPI -Vascular surgery following per above  Significant weight loss/moderate protein calorie malnutrition: Likely due to poor p.o. intake from the above. -Currently awaiting bowel function to feed. -TPN as tolerated -Colon biopsy from the surgery was negative for any malignancy, biopsy from body of stomach were suggestive of intestinal metaplasia but no malignancy.  History of COPD: Stable -Continue DuoNeb as needed   Peripheral vascular disease with dry gangrene: Stable. -Continue home statin -Outpatient vascular surgery follow-up -On therapeutic anticoagulation with IV heparin per pharmacy  Essential hypertension: Fairly controlled. -Continue home meds  Chronic atrial fibrillation: Not in RVR.   Chad2vasc score 6. -On Eliquis and metoprolol at home. -Continued on Heparin and metoprolol -Remains rate controlled  Chronic pain: -Pain medications as above -Continue Lyrica 200 3 times daily, low threshold to reduce the dose if the patient remains sleepy and confused  Tobacco use disorder: -Cessation counseling -Continue Nicotine patch. Stable  DVT prophylaxis: Heparin gtt Code Status: Full Family Communication: Pt in room, family not at bedside, discussed with daughter on phone on 07/23/2018. Disposition Plan: Uncertain at this time  Consultants:   Vascular surgery  Gastroenterology  Infectious disease  Interventional radiology  General surgery  Procedures:   EGD on 4/14  Flex sigmoidoscopy on 4/14  IR drain placement on 4/16 which showed Grant large left colon defect, 4/16  Ex Lap, LOA, Left sigmoid colectomy, descending colostomy (Hartmann's), Dr. Derrell Lolling, 4/16  Antimicrobials: Anti-infectives (From admission, onward)   Start     Dose/Rate Route Frequency Ordered Stop   07/15/18 1700   meropenem (MERREM) 1 g in sodium chloride 0.9 % 100 mL IVPB     1 g 200 mL/hr over 30 Minutes Intravenous Every 8 hours 07/15/18 1620     07/08/18 2300  vancomycin (VANCOCIN) 1,500 mg in sodium chloride 0.9 % 500 mL IVPB  Status:  Discontinued     1,500 mg 250 mL/hr over 120 Minutes Intravenous  Once 07/07/18 2128 07/07/18 2129   07/08/18 2300  vancomycin (VANCOCIN) 1,500 mg in sodium chloride 0.9 % 500 mL IVPB  Status:  Discontinued     1,500 mg 250 mL/hr over 120 Minutes Intravenous Every 24 hours 07/07/18 2129 07/08/18 0726   07/08/18 0600  ceFEPIme (MAXIPIME) 2 g in sodium chloride 0.9 % 100 mL IVPB  Status:  Discontinued     2 g 200 mL/hr over 30 Minutes Intravenous Every 8 hours 07/07/18 2124 07/13/18 1333   07/07/18 2130  vancomycin (VANCOCIN) 1,500 mg in sodium chloride 0.9 % 500 mL IVPB     1,500 mg 250 mL/hr over 120 Minutes Intravenous  Once 07/07/18 2123 07/08/18 0028   07/07/18 2115  vancomycin (VANCOCIN) IVPB 1000 mg/200 mL premix  Status:  Discontinued     1,000 mg 200 mL/hr over 60 Minutes Intravenous  Once 07/07/18 2110 07/07/18 2128   07/07/18 2115  ceFEPIme (MAXIPIME) 2 g in sodium chloride 0.9 % 100 mL IVPB     2 g 200 mL/hr over 30 Minutes Intravenous  Once 07/07/18 2110 07/07/18 2203      Subjective: No acute complaint. No acute event overnight.  Objective: Vitals:   07/26/18 0930 07/26/18 1340 07/26/18 2023 07/27/18 0425  BP: 137/67 (!) 163/86 (!) 146/65 (!) 142/53  Pulse: 78 76 75 76  Resp: 18 18 20 15   Temp: 98.7 F (37.1 C) 98.7 F (37.1 C) 98.9 F (37.2 C) 98.7 F (37.1 C)  TempSrc: Oral Oral Oral Oral  SpO2: 95% 98% 97% 94%  Weight:      Height:        Intake/Output Summary (Last 24 hours) at 07/27/2018 1213 Last data filed at 07/27/2018 0900 Gross per 24 hour  Intake 1299.13 ml  Output 1105 ml  Net 194.13 ml   Filed Weights   07/24/18 0517 07/25/18 0605 07/26/18 0401  Weight: 63.4 kg 63.4 kg 64.2 kg    Examination:  General exam:  Appears calm and comfortable  Respiratory system: Clear to auscultation. Respiratory effort normal. Cardiovascular system: S1 & S2 heard, RRR Gastrointestinal system: Soft, nondistended, mildly tender Central nervous system: Alert and oriented. No focal neurological deficits. Extremities: Symmetric 5 x 5 power. Skin: No rashes, lesions Psychiatry: Judgement and insight appear normal. Mood & affect appropriate.      Data Reviewed: I have personally reviewed following labs and imaging studies  CBC: Recent Labs  Lab 07/23/18 0548 07/24/18 0347 07/25/18 0601 07/26/18 0239 07/27/18 0458  WBC 19.3* 19.3* 17.6* 18.8* 15.9*  NEUTROABS  --   --   --   --  10.8*  HGB 8.2* 7.9* 7.7* 7.6* 7.7*  HCT 26.5* 25.8* 25.4* 24.8* 25.2*  MCV 82.6 81.1 81.2 81.3 82.1  PLT 299 357 319 367 422*   Basic Metabolic Panel: Recent Labs  Lab 07/21/18 0541 07/22/18 0643 07/23/18 0548 07/24/18 0347 07/26/18 0904 07/27/18 0458  NA 134* 133* 133* 132* 133* 136  K 4.5 4.3 4.5 4.3 4.6 4.6  CL 103 103 103 103 101 105  CO2 23 23 22 23 23 25   GLUCOSE 104* 106* 130* 116* 129* 121*  BUN 13 13 13 12 11 11   CREATININE 0.34* 0.35* 0.36* 0.40* 0.41* 0.42*  CALCIUM 8.1* 8.0* 8.1* 7.9* 8.5* 8.4*  MG 1.9  --  1.7 1.9  --  1.9  PHOS 2.9  --  3.1  --   --  3.9   GFR: Estimated Creatinine Clearance: 62.7 mL/min (Grant) (by C-G formula based on SCr of 0.42 mg/dL (L)). Liver Function Tests: Recent Labs  Lab 07/23/18 0548 07/27/18 0458  AST 69* 254*  ALT 35 130*  ALKPHOS 267* 328*  BILITOT 0.6 0.4  PROT 5.1* 6.0*  ALBUMIN 1.3* 1.3*   No results for input(s): LIPASE, AMYLASE in the last 168 hours. No results for input(s): AMMONIA in the last 168 hours. Coagulation Profile: No results for input(s): INR, PROTIME in the last 168 hours. Cardiac Enzymes: No results for input(s): CKTOTAL, CKMB, CKMBINDEX, TROPONINI in the last 168 hours. BNP (last 3 results) No results for input(s): PROBNP in the last 8760  hours. HbA1C: No results for input(s): HGBA1C in the last 72 hours. CBG: Recent Labs  Lab 07/26/18 2021 07/27/18 0005 07/27/18 0424 07/27/18 0859 07/27/18 1139  GLUCAP 113* 122* 113* 117* 139*   Lipid Profile: Recent Labs    07/27/18 0458  TRIG 63   Thyroid Function Tests: No results for input(s): TSH, T4TOTAL, FREET4, T3FREE, THYROIDAB in the last 72 hours. Anemia Panel: No results for input(s): VITAMINB12, FOLATE, FERRITIN, TIBC, IRON, RETICCTPCT in the last 72 hours. Sepsis Labs: No results for input(s): PROCALCITON, LATICACIDVEN in the last 168 hours.  No results found for this or any previous visit (from the  past 240 hour(s)).   Radiology Studies: No results found.  Scheduled Meds: . Chlorhexidine Gluconate Cloth  6 each Topical Q0600  . docusate sodium  100 mg Oral BID  . feeding supplement (ENSURE ENLIVE)  237 mL Oral TID BM  . insulin aspart  0-9 Units Subcutaneous Q4H  . lidocaine  1 patch Transdermal Q24H  . metoprolol tartrate  75 mg Oral BID  . nicotine  21 mg Transdermal Daily  . pantoprazole (PROTONIX) IV  40 mg Intravenous Q24H  . pregabalin  200 mg Oral TID  . saccharomyces boulardii  250 mg Oral BID  . sodium chloride flush  10-40 mL Intracatheter Q12H   Continuous Infusions: . sodium chloride 10 mL/hr at 07/22/18 2300  . heparin 1,200 Units/hr (07/27/18 0556)  . meropenem (MERREM) IV 1 g (07/27/18 1610)  . methocarbamol (ROBAXIN) IV 500 mg (07/27/18 0545)  . TPN ADULT (ION) 60 mL/hr at 07/26/18 1723  . TPN ADULT (ION)       LOS: 20 days   Lynden Oxford, MD [facility] Pager On Amion  If 7PM-7AM, please contact night-coverage 07/27/2018, 12:13 PM

## 2018-07-27 NOTE — Progress Notes (Signed)
Bandage change this am on midline abdominal wound and noted changes in the appearance and foul smell noted when removing old bandage.  Surgical paged and return call received from OR.  Changes in wound reported at 1140am.

## 2018-07-28 ENCOUNTER — Inpatient Hospital Stay (HOSPITAL_COMMUNITY): Payer: Medicare Other

## 2018-07-28 DIAGNOSIS — R1030 Lower abdominal pain, unspecified: Secondary | ICD-10-CM

## 2018-07-28 LAB — CBC
HCT: 25.6 % — ABNORMAL LOW (ref 36.0–46.0)
Hemoglobin: 7.9 g/dL — ABNORMAL LOW (ref 12.0–15.0)
MCH: 25.1 pg — ABNORMAL LOW (ref 26.0–34.0)
MCHC: 30.9 g/dL (ref 30.0–36.0)
MCV: 81.3 fL (ref 80.0–100.0)
Platelets: 386 10*3/uL (ref 150–400)
RBC: 3.15 MIL/uL — ABNORMAL LOW (ref 3.87–5.11)
RDW: 18.6 % — ABNORMAL HIGH (ref 11.5–15.5)
WBC: 17.6 10*3/uL — ABNORMAL HIGH (ref 4.0–10.5)
nRBC: 0.6 % — ABNORMAL HIGH (ref 0.0–0.2)

## 2018-07-28 LAB — BLOOD GAS, ARTERIAL
Acid-base deficit: 0.4 mmol/L (ref 0.0–2.0)
Bicarbonate: 23 mmol/L (ref 20.0–28.0)
Drawn by: 511851
FIO2: 21
O2 Saturation: 90.2 %
Patient temperature: 98.6
pCO2 arterial: 32.9 mmHg (ref 32.0–48.0)
pH, Arterial: 7.458 — ABNORMAL HIGH (ref 7.350–7.450)
pO2, Arterial: 61.5 mmHg — ABNORMAL LOW (ref 83.0–108.0)

## 2018-07-28 LAB — COMPREHENSIVE METABOLIC PANEL
ALT: 150 U/L — ABNORMAL HIGH (ref 0–44)
ALT: 125 U/L — ABNORMAL HIGH (ref 0–44)
AST: 248 U/L — ABNORMAL HIGH (ref 15–41)
AST: 170 U/L — ABNORMAL HIGH (ref 15–41)
Albumin: 1.3 g/dL — ABNORMAL LOW (ref 3.5–5.0)
Albumin: 1.4 g/dL — ABNORMAL LOW (ref 3.5–5.0)
Alkaline Phosphatase: 290 U/L — ABNORMAL HIGH (ref 38–126)
Alkaline Phosphatase: 262 U/L — ABNORMAL HIGH (ref 38–126)
Anion gap: 5 (ref 5–15)
Anion gap: 8 (ref 5–15)
BUN: 14 mg/dL (ref 8–23)
BUN: 14 mg/dL (ref 8–23)
CO2: 24 mmol/L (ref 22–32)
CO2: 22 mmol/L (ref 22–32)
Calcium: 8.3 mg/dL — ABNORMAL LOW (ref 8.9–10.3)
Calcium: 8.5 mg/dL — ABNORMAL LOW (ref 8.9–10.3)
Chloride: 106 mmol/L (ref 98–111)
Chloride: 107 mmol/L (ref 98–111)
Creatinine, Ser: 0.41 mg/dL — ABNORMAL LOW (ref 0.44–1.00)
Creatinine, Ser: 0.37 mg/dL — ABNORMAL LOW (ref 0.44–1.00)
GFR calc Af Amer: >60 mL/min (ref >60)
GFR calc Af Amer: >60 mL/min (ref >60)
GFR calc non Af Amer: >60 mL/min (ref >60)
GFR calc non Af Amer: >60 mL/min (ref >60)
Glucose, Bld: 120 mg/dL — ABNORMAL HIGH (ref 70–99)
Glucose, Bld: 107 mg/dL — ABNORMAL HIGH (ref 70–99)
Potassium: 4.5 mmol/L (ref 3.5–5.1)
Potassium: 4.6 mmol/L (ref 3.5–5.1)
Sodium: 135 mmol/L (ref 135–145)
Sodium: 137 mmol/L (ref 135–145)
Total Bilirubin: 0.5 mg/dL (ref 0.3–1.2)
Total Bilirubin: 0.5 mg/dL (ref 0.3–1.2)
Total Protein: 5.9 g/dL — ABNORMAL LOW (ref 6.5–8.1)
Total Protein: 6.3 g/dL — ABNORMAL LOW (ref 6.5–8.1)

## 2018-07-28 LAB — APTT: aPTT: 52 seconds — ABNORMAL HIGH (ref 24–36)

## 2018-07-28 LAB — GLUCOSE, CAPILLARY
Glucose-Capillary: 111 mg/dL — ABNORMAL HIGH (ref 70–99)
Glucose-Capillary: 114 mg/dL — ABNORMAL HIGH (ref 70–99)
Glucose-Capillary: 124 mg/dL — ABNORMAL HIGH (ref 70–99)
Glucose-Capillary: 127 mg/dL — ABNORMAL HIGH (ref 70–99)
Glucose-Capillary: 127 mg/dL — ABNORMAL HIGH (ref 70–99)
Glucose-Capillary: 127 mg/dL — ABNORMAL HIGH (ref 70–99)

## 2018-07-28 LAB — PROTIME-INR
INR: 1.1 (ref 0.8–1.2)
Prothrombin Time: 14.5 seconds (ref 11.4–15.2)

## 2018-07-28 LAB — CBC WITH DIFFERENTIAL/PLATELET
Abs Immature Granulocytes: 0.3 10*3/uL — ABNORMAL HIGH (ref 0.00–0.07)
Basophils Absolute: 0.1 10*3/uL (ref 0.0–0.1)
Basophils Relative: 1 %
Eosinophils Absolute: 0.4 10*3/uL (ref 0.0–0.5)
Eosinophils Relative: 2 %
HCT: 23.7 % — ABNORMAL LOW (ref 36.0–46.0)
Hemoglobin: 7.1 g/dL — ABNORMAL LOW (ref 12.0–15.0)
Immature Granulocytes: 2 %
Lymphocytes Relative: 24 %
Lymphs Abs: 3.7 10*3/uL (ref 0.7–4.0)
MCH: 24.7 pg — ABNORMAL LOW (ref 26.0–34.0)
MCHC: 30 g/dL (ref 30.0–36.0)
MCV: 82.3 fL (ref 80.0–100.0)
Monocytes Absolute: 2.3 10*3/uL — ABNORMAL HIGH (ref 0.1–1.0)
Monocytes Relative: 15 %
Neutro Abs: 8.7 10*3/uL — ABNORMAL HIGH (ref 1.7–7.7)
Neutrophils Relative %: 56 %
Platelets: 395 10*3/uL (ref 150–400)
RBC: 2.88 MIL/uL — ABNORMAL LOW (ref 3.87–5.11)
RDW: 18.5 % — ABNORMAL HIGH (ref 11.5–15.5)
WBC: 15.4 10*3/uL — ABNORMAL HIGH (ref 4.0–10.5)
nRBC: 0.4 % — ABNORMAL HIGH (ref 0.0–0.2)

## 2018-07-28 LAB — TSH: TSH: 3.614 u[IU]/mL (ref 0.350–4.500)

## 2018-07-28 LAB — T4, FREE: Free T4: 0.95 ng/dL (ref 0.82–1.77)

## 2018-07-28 LAB — AMMONIA: Ammonia: 31 umol/L (ref 9–35)

## 2018-07-28 LAB — HEPARIN LEVEL (UNFRACTIONATED): Heparin Unfractionated: 0.39 IU/mL (ref 0.30–0.70)

## 2018-07-28 MED ORDER — METHOCARBAMOL 1000 MG/10ML IJ SOLN
500.0000 mg | Freq: Three times a day (TID) | INTRAVENOUS | Status: DC | PRN
  Filled 2018-07-28: qty 5

## 2018-07-28 MED ORDER — POLYETHYLENE GLYCOL 3350 17 G PO PACK
17.0000 g | PACK | Freq: Every day | ORAL | Status: DC
  Administered 2018-07-28 – 2018-07-29 (×2): 17 g via ORAL
  Filled 2018-07-28 (×4): qty 1

## 2018-07-28 MED ORDER — PREGABALIN 50 MG PO CAPS
50.0000 mg | ORAL_CAPSULE | Freq: Three times a day (TID) | ORAL | Status: DC
  Administered 2018-07-29: 50 mg via ORAL
  Filled 2018-07-28: qty 1

## 2018-07-28 MED ORDER — TRAVASOL 10 % IV SOLN
INTRAVENOUS | Status: AC
  Administered 2018-07-28: 18:00:00 via INTRAVENOUS
  Filled 2018-07-28: qty 1195.2

## 2018-07-28 NOTE — Progress Notes (Signed)
Physical Therapy Treatment Patient Details Name: Danielle FilbertLinda S Grant MRN: [id number] DOB: 15-Sep-1945 Today's Date: 07/28/2018    History of Present Illness  Danielle FilbertLinda S Grant is a 73 y.o. female with medical history significant of chronic back pain, GERD, hyperlipidemia, hypertension, left knee osteoarthritis, palpitations, PVD h/o Aortofem bypass, s/p RLE thromboembolectomy and R common emoral artery patch angioplasty 05/31/18, history of tobacco use who presents to ER due to fall at home.  patient now s/p EXPLORATORY LAPAROTOMY, lysis of adhesions X 45 minutes LEFT SIGMOID COLECTOMY, SPLENIC FLEXOR MOBILIZATION,DESCENDING COLOSTOMY on 4/16.    PT Comments    Pt admitted with above diagnosis. Pt currently with functional limitations due to balance and endurance deficits. Pt agreed to get OOB.  Once on EOB, pt c/o nausea and even with max encouragement refused to get up.  Pt did perform exercises in bed.  Nursing notified of nausea and pain.   Pt will benefit from skilled PT to increase their independence and safety with mobility to allow discharge to the venue listed below.     Follow Up Recommendations  SNF(HHPT if progresses well )     Equipment Recommendations  Other (comment)(TBD )    Recommendations for Other Services       Precautions / Restrictions Precautions Precautions: Fall Restrictions Weight Bearing Restrictions: No    Mobility  Bed Mobility Overal bed mobility: Needs Assistance Bed Mobility: Supine to Sit     Supine to sit: Min guard;Min assist     General bed mobility comments: Pt was able to come to Sitting with min assist.  Sat a few min and then laid down due to nausea and pain.  Was able to convince pt to sit up again but pt immediately laid back down and was adamant she could not get up due to nausea.  Notified nursing and nursing to bring meds.   Transfers                    Ambulation/Gait                 Stairs              Wheelchair Mobility    Modified Rankin (Stroke Patients Only)       Balance Overall balance assessment: Needs assistance Sitting-balance support: Feet supported;No upper extremity supported Sitting balance-Leahy Scale: Fair Sitting balance - Comments: Sat up for 3 min and then nauseated therefore laid down.  Tried to get pt to get up and was able to get her sitting again however she laid back down and refused.                                     Cognition Arousal/Alertness: Awake/alert Behavior During Therapy: WFL for tasks assessed/performed Overall Cognitive Status: Impaired/Different from baseline                                 General Comments: patient unable to follow cues well, reporting high pain      Exercises General Exercises - Lower Extremity Ankle Circles/Pumps: AROM;10 reps;Supine;Both Quad Sets: AROM;Both;5 reps;Supine Long Arc Quad: AROM;Both;Seated;5 reps    General Comments General comments (skin integrity, edema, etc.): VSS      Pertinent Vitals/Pain Pain Assessment: Faces Faces Pain Scale: Hurts worst Pain Location: stomach Pain Descriptors / Indicators: Burning;Discomfort Pain Intervention(s):  Limited activity within patient's tolerance;Monitored during session;Repositioned;Patient requesting pain meds-RN notified    Home Living                      Prior Function            PT Goals (current goals can now be found in the care plan section) Acute Rehab PT Goals Patient Stated Goal: return home with family to assist Progress towards PT goals: Not progressing toward goals - comment(nausea and pain limiting progress)    Frequency    Min 2X/week      PT Plan Current plan remains appropriate;Frequency needs to be updated    Co-evaluation              AM-PAC PT "6 Clicks" Mobility   Outcome Measure  Help needed turning from your back to your side while in a flat bed without using  bedrails?: A Little Help needed moving from lying on your back to sitting on the side of a flat bed without using bedrails?: A Little Help needed moving to and from a bed to a chair (including a wheelchair)?: A Little Help needed standing up from a chair using your arms (e.g., wheelchair or bedside chair)?: A Little Help needed to walk in hospital room?: A Little Help needed climbing 3-5 steps with a railing? : A Lot 6 Click Score: 17    End of Session Equipment Utilized During Treatment: Gait belt Activity Tolerance: Patient limited by pain;Patient limited by fatigue Patient left: in bed;with call bell/phone within reach;with bed alarm set(refused to get OOB) Nurse Communication: Mobility status PT Visit Diagnosis: Unsteadiness on feet (R26.81);Other abnormalities of gait and mobility (R26.89);Difficulty in walking, not elsewhere classified (R26.2)     Time: 5681-2751 PT Time Calculation (min) (ACUTE ONLY): 25 min  Charges:  $Therapeutic Exercise: 8-22 mins $Therapeutic Activity: 8-22 mins                     Hala Narula,PT Acute Rehabilitation Services Pager:  [phone]  Office:  [phone]     Berline Lopes 07/28/2018, 9:05 AM

## 2018-07-28 NOTE — Consult Note (Signed)
Requesting Physician: Dr. Allena Katz    Chief Complaint: Altered mental status  History obtained from:Chart  review  HPI:                                                                                                                                       [name] is an 73 y.o. female with past medical history of atrial fibrillation on heparin drip, peripheral vascular disease status post femoral thrombectomy, hypertension, chronic back pain admitted for sepsis due to an eccentric ischemia and perforated descending colon status post IR drain/ex lap/sigmoid colectomy and descending colostomy with postop ileus, also with severe ulcerative gastritis.  Patient has been receiving pain medications and Lyrica which has been gradually tapered.  Despite this patient has been gradually confused and more lethargic.  However exam change significantly around 6 PM when assessed by nurse, patient was nonverbal.  There is also some generalized shakes, not rhythmic/tonic-clonic jerking.  Neurology was consulted by hospitalist Dr. Allena Katz.  Stat CT head was ordered to rule out hemorrhage as patient was on heparin drip.  Metabolic work-up and blood gas was ordered and pending at time of consult.     Past Medical History:  Diagnosis Date  . Chronic back pain   . GERD (gastroesophageal reflux disease)   . Hypercholesterolemia   . Hyperlipidemia   . Hypertension   . Osteoarthritis of left knee    End-stage  . Palpitations   . PVD (peripheral vascular disease) (HCC)   . Tobacco user     Past Surgical History:  Procedure Laterality Date  . Angiogram-type unspecified  06/13/00   Dr. Hart Rochester  . BIOPSY  07/14/2018   Procedure: BIOPSY;  Surgeon: Beverley Fiedler, MD;  Location: [facility] ENDOSCOPY;  Service: Gastroenterology;;  . COLON RESECTION N/A 07/16/2018   Procedure: EXPLORATORY LAPAROTOMY, LEFT SIGMOID COLECTOMY,, SPLENIC FLEXOR, COLOSTOMY;  Surgeon: Axel Filler, MD;  Location: [facility] OR;  Service: General;   Laterality: N/A;  . ESOPHAGOGASTRODUODENOSCOPY N/A 07/14/2018   Procedure: ESOPHAGOGASTRODUODENOSCOPY (EGD);  Surgeon: Beverley Fiedler, MD;  Location: [facility] ENDOSCOPY;  Service: Gastroenterology;  Laterality: N/A;  . FEMORAL-POPLITEAL BYPASS GRAFT    . FINGER TENDON REPAIR  1988   Right mallet  . FLEXIBLE SIGMOIDOSCOPY N/A 07/14/2018   Procedure: FLEXIBLE SIGMOIDOSCOPY;  Surgeon: Beverley Fiedler, MD;  Location: [facility] ENDOSCOPY;  Service: Gastroenterology;  Laterality: N/A;  . KNEE ARTHROSCOPY  1996   Left  . LUMBAR FUSION  1998   Dr. Jacqulyn Bath  . PATCH ANGIOPLASTY Right 05/31/2018   Procedure: PATCH ANGIOPLASTY RIGHT FEMORAL ARTERY WITH DECRON PATCH;  Surgeon: Maeola Harman, MD;  Location: [facility] OR;  Service: Vascular;  Laterality: Right;  . THROMBECTOMY FEMORAL ARTERY Right 05/31/2018   Procedure: RIGHT FEMORAL THROMBECTOMY;  Surgeon: Maeola Harman, MD;  Location: [facility] OR;  Service: Vascular;  Laterality: Right;    Family History  Problem Relation Age of Onset  .  Heart failure Father        CHF  . Hypertension Father   . Heart disease Father   . Hypertension Paternal Aunt   . Hypertension Paternal Uncle   . Hypertension Paternal Grandmother   . Hypertension Paternal Grandfather   . Coronary artery disease Neg Hx        Premature   Social History:  reports that she has been smoking cigarettes. She has a 52.00 pack-year smoking history. She has never used smokeless tobacco. She reports that she does not drink alcohol or use drugs.  Allergies: No Known Allergies  Medications:                                                                                                                        I reviewed home medications   ROS:                                                                                                                                     Able to obtain due to patient's altered mental status   Examination:                                                                                                       General: well-developed Psych: Lethargic Eyes: No scleral injection HENT: No OP obstrucion Head: Normocephalic.  Cardiovascular: Normal rate and regular rhythm.  Respiratory: Effort normal and breath sounds normal to anterior ascultation GI: Soft.  No distension. There is no tenderness.  Skin: WDI    Neurological Examination (performed in CT scanner) Mental Status: Drowsy but easily arousable, answered her name and place correctly.  This month is February.  Then began to perseverate on February.  Will follow simple commands, mimic actions. Cranial Nerves: II: Visual fields grossly normal,  III,IV, VI: ptosis not present, extra-ocular motions intact bilaterally, pupils equal, round, reactive to light and accommodation V,VII: smile appears symmetric VIII: hearing normal bilaterally IX,X: uvula rises symmetrically XI: bilateral shoulder  shrug XII: midline tongue extension Motor: Right : Upper extremity   4+/5    Left:     Upper extremity   4+/5  Lower extremity   4/5     Lower extremity   4/5 Tone and bulk:normal tone throughout; no atrophy noted Sensory: Difficult to assess as patient was not cooperative, withdrew equally to noxious stimulus on both sides Plantars: Right: downgoing   Left: downgoing Cerebellar: normal finger-to-nose,  Gait: Unable to assess due to patient's mental status   Lab Results: Basic Metabolic Panel: Recent Labs  Lab 07/23/18 0548 07/24/18 0347 07/26/18 0904 07/27/18 0458 07/28/18 0725  NA 133* 132* 133* 136 135  K 4.5 4.3 4.6 4.6 4.5  CL 103 103 101 105 106  CO2 GLUCOSE 130* 116* 129* 121* 120*  BUN CREATININE 0.36* 0.40* 0.41* 0.42* 0.41*  CALCIUM 8.1* 7.9* 8.5* 8.4* 8.3*  MG 1.7 1.9  --  1.9  --   PHOS 3.1  --   --  3.9  --     CBC: Recent Labs  Lab 07/25/18 0601 07/26/18 0239 07/27/18 0458 07/28/18 0307 07/28/18 1820  WBC 17.6* 18.8* 15.9* 17.6*  15.4*  NEUTROABS  --   --  10.8*  --  8.7*  HGB 7.7* 7.6* 7.7* 7.9* 7.1*  HCT 25.4* 24.8* 25.2* 25.6* 23.7*  MCV 81.2 81.3 82.1 81.3 82.3  PLT 319 367 422* 386 395    Coagulation Studies: No results for input(s): LABPROT, INR in the last 72 hours.  Imaging: Ct Head Wo Contrast  Result Date: 07/28/2018 CLINICAL DATA:  Unexplained altered level of consciousness. EXAM: CT HEAD WITHOUT CONTRAST TECHNIQUE: Contiguous axial images were obtained from the base of the skull through the vertex without intravenous contrast. COMPARISON:  07/07/2018.  05/30/2018. FINDINGS: Brain: No change since the previous study. Brainstem and cerebellum appear unremarkable. Cerebral hemispheres show a few old small vessel infarctions within the basal ganglia and deep white matter. No cortical or large vessel territory infarction. No mass lesion, hemorrhage hydrocephalus or extra-axial collection. Vascular: There is atherosclerotic calcification of the major vessels at the base of the brain. Skull: Negative Sinuses/Orbits: Posterior nasal polyp again seen on the left, measuring about 2.5 cm in maximal size. Chronic inflammatory changes of the left division of the sphenoid sinus. Other sinuses negative. Orbits negative. Benign venous calcification in the left orbit not significant. Other: None IMPRESSION: No change or acute finding. 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 Chronic left sphenoid sinusitis. Chronic left posterior nasal polyp or polypoid mass. Electronically Signed   By: Paulina Fusi M.D.   On: 07/28/2018 17:41   Dg Kayleen Memos W Single Cm (sol Or Thin Ba)  Result Date: 07/27/2018 CLINICAL DATA:  Apparent defect in the gastric wall on CT scan of 07/23/2018. EXAM: WATER SOLUBLE UPPER GI SERIES TECHNIQUE: Single-column upper GI series was performed using water soluble contrast. CONTRAST:  25mL OMNIPAQUE IOHEXOL 300 MG/ML  SOLN COMPARISON:  CT scan 07/23/2018  FLUOROSCOPY TIME:  Fluoroscopy Time:  3 minutes and 18 seconds. Radiation Exposure Index (if provided by the fluoroscopic device): 91 mGy Number of Acquired Spot Images: FINDINGS: The patient was reluctant to drink the water-soluble contrast. After coaching, encouraging, and pleading with the patient, she refused to drink a sufficient quantity to obtain a diagnostic study. We repositioned the patient as much as possible in attempts to move the contrast material  to the area of abnormal gastric wall seen on CT, but this was not possible given the location of the apparent mural defect. IMPRESSION: Nondiagnostic study. Careful review of the CT scan from 07/23/2018 shows an apparent mural defect in the cranial wall of the mid stomach, adjacent to where the drain passes over the top of the stomach. As noted above, the patient was unwilling to consume enough contrast to fill the stomach sufficiently to assess this portion of the gastric wall. Despite concerted effort with repositioning, we were unable to move the contrast pool to area of concern. CT scan of the abdomen may help if water soluble contrast administered during the study can be demonstrated outside of the stomach. Electronically Signed   By: Kennith CenterEric  Mansell M.D.   On: 07/27/2018 16:28     I have reviewed the above imaging : CT head showed no hemorrhage  ASSESSMENT AND PLAN  73 y.o. female with past medical history of atrial fibrillation on heparin drip, peripheral vascular disease status post femoral thrombectomy, hypertension, chronic back pain admitted for sepsis due to an eccentric ischemia and perforated descending colon status post IR drain/ex lap/sigmoid colectomy and descending colostomy with postop ileus consulted for worsening mental status.  On my assessment, patient seemed drowsy but had no focal motor deficits.  Low suspicion for acute stroke.  Patient  on heparin drip and hemorrhage ruled out on CT. No significant asterixis on exam, will do  suspect etiology of encephalopathy likely toxic-metabolic.  Patient's LFTs have been trending upwards.    Acute toxic/metabolic encephalopathy Sepsis  Recommendations Recommend holding/limiting pain medications including narcotics and Lyrica.   Check ammonia, electrolytes, blood gas.  Continue to treat infection  Neurology will be available as needed.      [facility] Pager Number [phone]

## 2018-07-28 NOTE — Progress Notes (Signed)
Abdominal dressing changed per MD order. Patient tolerated well.

## 2018-07-28 NOTE — Progress Notes (Addendum)
Patient ID: [name], female   DOB: April 08, 1945, 73 y.o.   MRN: [id number]    12 Days Post-Op  Subjective: Patient very sleepy today.  Isn't eating anything essentially.  She doesn't want to eat.  Refused to drink contrast for UGI yesterday.  Objective: Vital signs in last 24 hours: Temp:  [98.7 F (37.1 C)-99.2 F (37.3 C)] 99.2 F (37.3 C) (04/28 0830) Pulse Rate:  [74-93] 93 (04/28 0835) Resp:  [13-22] 22 (04/28 0835) BP: (131-137)/(45-63) 132/45 (04/28 0830) SpO2:  [94 %-99 %] 98 % (04/28 0835) Weight:  [63.6 kg] 63.6 kg (04/28 0500) Last BM Date: 07/22/18  Intake/Output from previous day: 04/27 0701 - 04/28 0700 In: 1771.1 [P.O.:50; I.V.:1271.1; IV Piggyback:450] Out: 61 [Drains:11; Stool:50] Intake/Output this shift: No intake/output data recorded.  PE: Heart: regular Lungs: CTAB Abd: soft, appropriately tender, colostomy digitized today and exterior mucosal sloughed off and now mucosa is pink and viable.  Stoma is open and there is hard stool at my fingertip.  Midline wound continues to separate at the base of the wound.  Superior wound with thick fibrinous exudate.  Both JP Drains with minimal serous output.     Lab Results:  Recent Labs    07/27/18 0458 07/28/18 0307  WBC 15.9* 17.6*  HGB 7.7* 7.9*  HCT 25.2* 25.6*  PLT 422* 386   BMET Recent Labs    07/27/18 0458 07/28/18 0725  NA 136 135  K 4.6 4.5  CL 105 106  CO2 25 24  GLUCOSE 121* 120*  BUN 11 14  CREATININE 0.42* 0.41*  CALCIUM 8.4* 8.3*   PT/INR No results for input(s): LABPROT, INR in the last 72 hours. CMP     Component Value Date/Time   NA 135 07/28/2018 0725   NA 142 05/22/2018 1039   K 4.5 07/28/2018 0725   CL 106 07/28/2018 0725   CO2 24 07/28/2018 0725   GLUCOSE 120 (H) 07/28/2018 0725   BUN 14 07/28/2018 0725   BUN 10 05/22/2018 1039   CREATININE 0.41 (L) 07/28/2018 0725   CREATININE 0.72 08/07/2012 1016   CALCIUM 8.3 (L) 07/28/2018 0725   PROT 5.9 (L)  07/28/2018 0725   PROT 6.8 05/22/2018 1039   ALBUMIN 1.3 (L) 07/28/2018 0725   ALBUMIN 3.6 (L) 05/22/2018 1039   AST 248 (H) 07/28/2018 0725   ALT 150 (H) 07/28/2018 0725   ALKPHOS 290 (H) 07/28/2018 0725   BILITOT 0.5 07/28/2018 0725   BILITOT 0.3 05/22/2018 1039   GFRNONAA >60 07/28/2018 0725   GFRNONAA 88 08/07/2012 1016   GFRAA >60 07/28/2018 0725   GFRAA >89 08/07/2012 1016   Lipase     Component Value Date/Time   LIPASE 65 (H) 07/08/2018 1325       Studies/Results: Dg Ugi W Single Cm (sol Or Thin Ba)  Result Date: 07/27/2018 CLINICAL DATA:  Apparent defect in the gastric wall on CT scan of 07/23/2018. EXAM: WATER SOLUBLE UPPER GI SERIES TECHNIQUE: Single-column upper GI series was performed using water soluble contrast. CONTRAST:  25mL OMNIPAQUE IOHEXOL 300 MG/ML  SOLN COMPARISON:  CT scan 07/23/2018 FLUOROSCOPY TIME:  Fluoroscopy Time:  3 minutes and 18 seconds. Radiation Exposure Index (if provided by the fluoroscopic device): 91 mGy Number of Acquired Spot Images: FINDINGS: The patient was reluctant to drink the water-soluble contrast. After coaching, encouraging, and pleading with the patient, she refused to drink a sufficient quantity to obtain a diagnostic study. We repositioned the patient as much as possible in  attempts to move the contrast material to the area of abnormal gastric wall seen on CT, but this was not possible given the location of the apparent mural defect. IMPRESSION: Nondiagnostic study. Careful review of the CT scan from 07/23/2018 shows an apparent mural defect in the cranial wall of the mid stomach, adjacent to where the drain passes over the top of the stomach. As noted above, the patient was unwilling to consume enough contrast to fill the stomach sufficiently to assess this portion of the gastric wall. Despite concerted effort with repositioning, we were unable to move the contrast pool to area of concern. CT scan of the abdomen may help if water soluble  contrast administered during the study can be demonstrated outside of the stomach. Electronically Signed   By: Kennith CenterEric  Mansell M.D.   On: 07/27/2018 16:28    Anti-infectives: Anti-infectives (From admission, onward)   Start     Dose/Rate Route Frequency Ordered Stop   07/28/18 1000  fluconazole (DIFLUCAN) tablet 100 mg     100 mg Oral Daily 07/27/18 1219 08/04/18 0959   07/27/18 1400  fluconazole (DIFLUCAN) IVPB 200 mg     200 mg 100 mL/hr over 60 Minutes Intravenous  Once 07/27/18 1219 07/27/18 1426   07/15/18 1700  meropenem (MERREM) 1 g in sodium chloride 0.9 % 100 mL IVPB     1 g 200 mL/hr over 30 Minutes Intravenous Every 8 hours 07/15/18 1620     07/08/18 2300  vancomycin (VANCOCIN) 1,500 mg in sodium chloride 0.9 % 500 mL IVPB  Status:  Discontinued     1,500 mg 250 mL/hr over 120 Minutes Intravenous  Once 07/07/18 2128 07/07/18 2129   07/08/18 2300  vancomycin (VANCOCIN) 1,500 mg in sodium chloride 0.9 % 500 mL IVPB  Status:  Discontinued     1,500 mg 250 mL/hr over 120 Minutes Intravenous Every 24 hours 07/07/18 2129 07/08/18 0726   07/08/18 0600  ceFEPIme (MAXIPIME) 2 g in sodium chloride 0.9 % 100 mL IVPB  Status:  Discontinued     2 g 200 mL/hr over 30 Minutes Intravenous Every 8 hours 07/07/18 2124 07/13/18 1333   07/07/18 2130  vancomycin (VANCOCIN) 1,500 mg in sodium chloride 0.9 % 500 mL IVPB     1,500 mg 250 mL/hr over 120 Minutes Intravenous  Once 07/07/18 2123 07/08/18 0028   07/07/18 2115  vancomycin (VANCOCIN) IVPB 1000 mg/200 mL premix  Status:  Discontinued     1,000 mg 200 mL/hr over 60 Minutes Intravenous  Once 07/07/18 2110 07/07/18 2128   07/07/18 2115  ceFEPIme (MAXIPIME) 2 g in sodium chloride 0.9 % 100 mL IVPB     2 g 200 mL/hr over 30 Minutes Intravenous  Once 07/07/18 2110 07/07/18 2203       Assessment/Plan A. 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 Derrell Lollingamirez, 4/16POD #12 -TID WTD dressing changes to help with necrotic tissue at base of wound -cont on soft diet and ensure although patient not eating anything -colostomy with air present, no stool yet.  continue stool softener given  hard stool palpated on exam -add colace BID and may add miralax -mobilize and pulm toilet  -cont TNA due to [facility] and poor PO intake.  LFTs essentially stable, but if continue to trend up may have to stop TNA.  Patient unable to do UGI yesterday to assure no gastric injury.  May need an feeding tube placed given her inability to maintain her nutrition on her own.  FEN-soft diet/ensure,IVF,TPN, colace/miralax VTE -SCDs, heparindrip ID -Meropenem 4/15 >> Foley - Removed POD 1 Follow up -Dr. Derrell Lolling POC: [name] [phone] daughter/Cynthia Mayford Knife [phone].    LOS: 21 days    Letha Cape , [facility] 07/28/2018, 8:53 AM Pager: [phone]

## 2018-07-28 NOTE — Progress Notes (Signed)
PROGRESS NOTE    [name]  IHK:[id number] DOB: September 23, 1945 DOA: 07/07/2018 PCP: Junie Spencer, [name]    Brief Narrative:  73 year old female with history of chronic N/V/abdominal pain, A. fib, PVD status post right femoral thrombectomy on 05/31/2018, hypertension and chronic back pain presenting with intractable nausea, vomiting, abdominal pain, new onset of diarrhea and about 30 pounds weight loss in the last several months.  Admitted to [facility]  on 07/07/2018 with somnolence and a diagnosis of sepsis. She has had a prior aortobifem by Dr Hart Rochester in 2002 and had thrombectomy of the right limb of this by Dr Randie Heinz 3/20. 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 Her left internal iliac artery was patent at that point.   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 Also there is a 3.9 x 1.8 cm and 2.6 x 1.1 cm fluid collection in right pelvis/groin thought to be seroma from prior surgery.   Per H&P, interventional radiology at [facility] was consulted and consideration was being given for stenting her SMA which has about a (30-50% stenosis) to improve mesenteric blood flow.   Patient was transferred to [facility] for further management.  Patient was also treated with cefepime for presumed pneumonia.  Gastroenterology and vascular surgery consulted.  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  Biopsies taken.  She also had flexible sigmoidoscopy that showed only a 7 mm sessile polyp in the rectum but poor prep.  On 4/15, and  continued to have significant leukocytosis, mild temperature and abdominal tenderness.  This raised concern about intra-abdominal occult infection especially with the fluid collections noted on CT abdomen.  Infectious disease consulted and started patient on meropenem.  IR consulted and recommended getting repeat CT abdomen and pelvis which showed frank perforation of the descending colon appears to be contained within a portion of the omentum, or occluded celiac axis, slightly decreased pancreatic tail lesion but no previously noted fluid collections.  General surgery and IR consulted.  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 Derrell Lolling on 4/16.  Abdomen left open out of concern for infectious process and ileus. Started on TPN on 07/18/2018.  Continues to have minimal oral intake.  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 Derrell Lolling, 4/16 Postop ileus -Abdomen remains open -Bowel sounds present but no output in ostomy bag. -IR and general surgery following -VSS on board as well-plan angiogram for SMA stenosis before discharge -Pain control per surgery-PRN morphine, IV Tylenol and Robaxin. -Hemoglobin stable, mild worsening of Leukocytosis -TPN feeding started 4/18. Appreciate surgical assistance in weaning the TPN. -Pharmacy to manage electrolyte abnormalities with TPN. -Continue meropenem 4/15--per ID.   For now total 4 weeks therapy recommended by ID. -Appreciate PT. Encourage incentive spirometry. -Continue zofran as needed. -Appreciate general surgery assistance.  CT abdomen showed some small pockets of abscess as well as contained air with surgery thinks is postprocedure.  Does not think the patient  actually has a new rupture.  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 -Likely from occluded celiac axis -Continue PPI -Vascular surgery following per above  Significant weight loss/moderate protein calorie malnutrition: Likely due to poor p.o. intake from the above. -Currently awaiting bowel function to feed. -TPN as tolerated -Colon biopsy from the surgery was negative for any malignancy, biopsy from body of stomach were suggestive of intestinal metaplasia but no malignancy.  History of COPD: Stable -Continue DuoNeb as needed   Peripheral vascular disease with dry gangrene: Stable. -Continue home statin -Outpatient vascular surgery follow-up -On therapeutic anticoagulation with IV heparin per pharmacy  Essential hypertension: Fairly controlled. -Continue home meds  Chronic atrial fibrillation: Not in RVR.   Chad2vasc score 6. -On Eliquis and metoprolol at home. -Continued on Heparin and metoprolol -Remains rate controlled  Chronic pain: -Pain medications as above -Continue Lyrica reduce the dose from 200 mg 50 mg 3 times daily, low threshold to reduce the dose if the patient remains sleepy and confused  Tobacco use disorder: -Cessation counseling -Continue Nicotine patch. Stable  Oral thrush. We will treat with Diflucan.  Acute toxic/metabolic encephalopathy Confused on exam in afternoon CT head unremarkable Reduce lyrica Change robaxin to PRN   DVT prophylaxis: Heparin gtt Code Status: Full While the patient is currently recovering from significant surgery her recovery has been slow.  She maintains minimal p.o. intake.  May require to consider palliative care consultation.  Family is understanding.  Family Communication: Pt in room, family not at bedside, discussed with daughter on phone on 07/28/2018.  Disposition Plan: Uncertain at this time  Consultants:   Vascular  surgery  Gastroenterology  Infectious disease  Interventional radiology  General surgery  Procedures:   EGD on 4/14  Flex sigmoidoscopy on 4/14  IR drain placement on 4/16 which showed a large left colon defect, 4/16  Ex Lap, LOA, Left sigmoid colectomy, descending colostomy (Hartmann's), Dr. Derrell Lolling, 4/16  Antimicrobials: Anti-infectives (From admission, onward)   Start     Dose/Rate Route Frequency Ordered Stop   07/28/18 1000  fluconazole (DIFLUCAN) tablet 100 mg     100 mg Oral Daily 07/27/18 1219 08/04/18 0959   07/27/18 1400  fluconazole (DIFLUCAN) IVPB 200 mg     200 mg 100 mL/hr over 60 Minutes Intravenous  Once 07/27/18 1219 07/27/18 1426   07/15/18 1700  meropenem (MERREM) 1 g in sodium chloride 0.9 % 100 mL IVPB     1 g 200 mL/hr over 30 Minutes Intravenous Every 8 hours 07/15/18 1620     07/08/18 2300  vancomycin (VANCOCIN) 1,500 mg in sodium chloride 0.9 % 500 mL IVPB  Status:  Discontinued     1,500 mg 250 mL/hr over 120 Minutes Intravenous  Once 07/07/18 2128 07/07/18 2129   07/08/18 2300  vancomycin (VANCOCIN) 1,500 mg in sodium chloride 0.9 % 500 mL IVPB  Status:  Discontinued     1,500 mg 250 mL/hr over 120 Minutes Intravenous Every 24 hours 07/07/18 2129 07/08/18 0726   07/08/18 0600  ceFEPIme (MAXIPIME) 2 g in sodium chloride 0.9 % 100 mL IVPB  Status:  Discontinued     2 g 200 mL/hr over 30 Minutes Intravenous Every 8 hours 07/07/18 2124 07/13/18 1333   07/07/18 2130  vancomycin (VANCOCIN) 1,500 mg in sodium  chloride 0.9 % 500 mL IVPB     1,500 mg 250 mL/hr over 120 Minutes Intravenous  Once 07/07/18 2123 07/08/18 0028   07/07/18 2115  vancomycin (VANCOCIN) IVPB 1000 mg/200 mL premix  Status:  Discontinued     1,000 mg 200 mL/hr over 60 Minutes Intravenous  Once 07/07/18 2110 07/07/18 2128   07/07/18 2115  ceFEPIme (MAXIPIME) 2 g in sodium chloride 0.9 % 100 mL IVPB     2 g 200 mL/hr over 30 Minutes Intravenous  Once 07/07/18 2110 07/07/18 2203       Subjective: No acute complaint. No acute event overnight. Later in the afternoon patient has been more tired and lethargic.  With warm off and voice.  No focal deficit on exam.  Neurology was consulted.  Objective: Vitals:   07/28/18 1655 07/28/18 1700 07/28/18 1703 07/28/18 1945  BP:   (!) 142/74 140/60  Pulse: 75 76 79 80  Resp: Temp:   98.9 F (37.2 C) 99.2 F (37.3 C)  TempSrc:   Oral Oral  SpO2: 94% 92% 93% 95%  Weight:      Height:        Intake/Output Summary (Last 24 hours) at 07/28/2018 2008 Last data filed at 07/28/2018 1800 Gross per 24 hour  Intake 2544.76 ml  Output 1111 ml  Net 1433.76 ml   Filed Weights   07/25/18 0605 07/26/18 0401 07/28/18 0500  Weight: 63.4 kg 64.2 kg 63.6 kg    Examination:  General exam: Appears calm and comfortable  Respiratory system: Clear to auscultation. Respiratory effort normal. Cardiovascular system: S1 & S2 heard, RRR Gastrointestinal system: Soft, nondistended, mildly tender Central nervous system: Alert and oriented to self only. No focal neurological deficits. Extremities: Symmetric 5 x 5 power. Skin: No rashes, lesions Psychiatry: J somewhat confused tired Data Reviewed: I have personally reviewed following labs and imaging studies  CBC: Recent Labs  Lab 07/25/18 0601 07/26/18 0239 07/27/18 0458 07/28/18 0307 07/28/18 1820  WBC 17.6* 18.8* 15.9* 17.6* 15.4*  NEUTROABS  --   --  10.8*  --  8.7*  HGB 7.7* 7.6* 7.7* 7.9* 7.1*  HCT 25.4* 24.8* 25.2* 25.6* 23.7*  MCV 81.2 81.3 82.1 81.3 82.3  PLT 319 367 422* 386 395   Basic Metabolic Panel: Recent Labs  Lab 07/23/18 0548 07/24/18 0347 07/26/18 0904 07/27/18 0458 07/28/18 0725 07/28/18 1820  NA 133* 132* 133* 136 135 137  K 4.5 4.3 4.6 4.6 4.5 4.6  CL 103 103 101 105 106 107  CO2 GLUCOSE 130* 116* 129* 121* 120* 107*  BUN CREATININE 0.36* 0.40* 0.41* 0.42* 0.41* 0.37*  CALCIUM 8.1* 7.9* 8.5*  8.4* 8.3* 8.5*  MG 1.7 1.9  --  1.9  --   --   PHOS 3.1  --   --  3.9  --   --    GFR: Estimated Creatinine Clearance: 62.7 mL/min (A) (by C-G formula based on SCr of 0.37 mg/dL (L)). Liver Function Tests: Recent Labs  Lab 07/23/18 0548 07/27/18 0458 07/28/18 0725 07/28/18 1820  AST 69* 254* 248* 170*  ALT 35 130* 150* 125*  ALKPHOS 267* 328* 290* 262*  BILITOT 0.6 0.4 0.5 0.5  PROT 5.1* 6.0* 5.9* 6.3*  ALBUMIN 1.3* 1.3* 1.3* 1.4*   No results for input(s): LIPASE, AMYLASE in the last 168 hours. Recent Labs  Lab 07/28/18 1820  AMMONIA 31  Coagulation Profile: Recent Labs  Lab 07/28/18 1820  INR 1.1   Cardiac Enzymes: No results for input(s): CKTOTAL, CKMB, CKMBINDEX, TROPONINI in the last 168 hours. BNP (last 3 results) No results for input(s): PROBNP in the last 8760 hours. HbA1C: No results for input(s): HGBA1C in the last 72 hours. CBG: Recent Labs  Lab 07/28/18 0416 07/28/18 0814 07/28/18 1206 07/28/18 1628 07/28/18 1950  GLUCAP 124* 127* 127* 111* 114*   Lipid Profile: Recent Labs    07/27/18 0458  TRIG 63   Thyroid Function Tests: Recent Labs    07/28/18 1801 07/28/18 1820  TSH 3.614  --   FREET4  --  0.95   Anemia Panel: No results for input(s): VITAMINB12, FOLATE, FERRITIN, TIBC, IRON, RETICCTPCT in the last 72 hours. Sepsis Labs: No results for input(s): PROCALCITON, LATICACIDVEN in the last 168 hours.  No results found for this or any previous visit (from the past 240 hour(s)).   Radiology Studies: Ct Head Wo Contrast  Result Date: 07/28/2018 CLINICAL DATA:  Unexplained altered level of consciousness. EXAM: CT HEAD WITHOUT CONTRAST TECHNIQUE: Contiguous axial images were obtained from the base of the skull through the vertex without intravenous contrast. COMPARISON:  07/07/2018.  05/30/2018. FINDINGS: Brain: No change since the previous study. Brainstem and cerebellum appear unremarkable. Cerebral hemispheres show a few old small  vessel infarctions within the basal ganglia and deep white matter. No cortical or large vessel territory infarction. No mass lesion, hemorrhage hydrocephalus or extra-axial collection. Vascular: There is atherosclerotic calcification of the major vessels at the base of the brain. Skull: Negative Sinuses/Orbits: Posterior nasal polyp again seen on the left, measuring about 2.5 cm in maximal size. Chronic inflammatory changes of the left division of the sphenoid sinus. Other sinuses negative. Orbits negative. Benign venous calcification in the left orbit not significant. Other: None IMPRESSION: No change or acute finding. 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 Chronic left sphenoid sinusitis. Chronic left posterior nasal polyp or polypoid mass. Electronically Signed   By: Paulina Fusi M.D.   On: 07/28/2018 17:41   Dg Kayleen Memos W Single Cm (sol Or Thin Ba)  Result Date: 07/27/2018 CLINICAL DATA:  Apparent defect in the gastric wall on CT scan of 07/23/2018. EXAM: WATER SOLUBLE UPPER GI SERIES TECHNIQUE: Single-column upper GI series was performed using water soluble contrast. CONTRAST:  25mL OMNIPAQUE IOHEXOL 300 MG/ML  SOLN COMPARISON:  CT scan 07/23/2018 FLUOROSCOPY TIME:  Fluoroscopy Time:  3 minutes and 18 seconds. Radiation Exposure Index (if provided by the fluoroscopic device): 91 mGy Number of Acquired Spot Images: FINDINGS: The patient was reluctant to drink the water-soluble contrast. After coaching, encouraging, and pleading with the patient, she refused to drink a sufficient quantity to obtain a diagnostic study. We repositioned the patient as much as possible in attempts to move the contrast material to the area of abnormal gastric wall seen on CT, but this was not possible given the location of the apparent mural defect. IMPRESSION: Nondiagnostic study. Careful review of the CT scan from 07/23/2018 shows an apparent mural defect in the cranial wall of  the mid stomach, adjacent to where the drain passes over the top of the stomach. As noted above, the patient was unwilling to consume enough contrast to fill the stomach sufficiently to assess this portion of the gastric wall. Despite concerted effort with repositioning, we were unable to move the contrast pool to area of concern. CT scan of the abdomen  may help if water soluble contrast administered during the study can be demonstrated outside of the stomach. Electronically Signed   By: Kennith CenterEric  Mansell M.D.   On: 07/27/2018 16:28    Scheduled Meds:  Chlorhexidine Gluconate Cloth  6 each Topical Q0600   docusate sodium  100 mg Oral BID   feeding supplement (ENSURE ENLIVE)  237 mL Oral TID BM   fluconazole  100 mg Oral Daily   insulin aspart  0-9 Units Subcutaneous Q4H   lidocaine  1 patch Transdermal Q24H   magic mouthwash  10 mL Oral QID   metoprolol tartrate  75 mg Oral BID   nicotine  21 mg Transdermal Daily   pantoprazole (PROTONIX) IV  40 mg Intravenous Q24H   polyethylene glycol  17 g Oral Daily   pregabalin  50 mg Oral TID   saccharomyces boulardii  250 mg Oral BID   sodium chloride flush  10-40 mL Intracatheter Q12H   Continuous Infusions:  sodium chloride 10 mL/hr at 07/22/18 2300   heparin 1,200 Units/hr (07/28/18 0400)   meropenem (MERREM) IV 1 g (07/28/18 1442)   methocarbamol (ROBAXIN) IV     TPN ADULT (ION) 83 mL/hr at 07/28/18 1745     LOS: 21 days   Lynden OxfordPranav Yorel Redder, MD [facility] Pager On Amion  If 7PM-7AM, please contact night-coverage 07/28/2018, 8:08 PM

## 2018-07-28 NOTE — Progress Notes (Signed)
PHARMACY - ADULT TOTAL PARENTERAL NUTRITION CONSULT NOTE   Pharmacy Consult for TPN Indication: Post-op ileus  Patient Measurements: Height: 5' 7"  (170.2 cm) Weight: 140 lb 3.4 oz (63.6 kg) IBW/kg (Calculated) : 61.6 TPN AdjBW (KG): 62.3 Body mass index is 21.96 kg/m.  Assessment: 84-yo female with h/o chronic N/V/abdominal pain, A. fib, PVD status post right femoral thrombectomy on 05/31/2018, hypertension and chronic back pain presenting with intractable nausea, vomiting, abdominal pain, new onset of diarrhea and about 30 pounds weight loss in the last several months.   She was admitted to [facility] with a diagnosis of sepsis. 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 Also there is a 3.9 x 1.8 cm and 2.6 x 1.1 cm fluid collection in right pelvis/groin thought to be seroma from prior surgery. IR at [facility] was consulted for stenting her SMA which has about a (30-50% stenosis) to improve mesenteric blood flow.   Patient was transferred to [facility] for further management. S/p upper endoscopy 07/14/18 found Severe ulcerative gastritis most consistent ischemia, biopsied, results pending. CT showed perforation of descending colon. 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 Now with post-op ileus in the setting of 30 pound weight loss and minimal intake since admission.   GI: Open abdomen. Small abscesses on CT. 2 x JP drains with 5 mls output, nothing from colostomy, LBM 4/22-adding Colace; Prealbumin >5 to 5.5. NGT pulled out 4/17- no plan to replace at this time. Patient reports continued nausea  and states emesis after drinking ~1/2 of Ensures (no emesis charted), now refusing anything by mouth. Pain with eating due to tongue sores (not yeast per Surgery). Discussed case with Ferdinand Cava, PA -- considering NGT again, ok to go back to full TPN.  Endo: No history of diabetes; CBGs <180 Insulin requirements in the past 24 hours: 1 unit Lytes: Corr Ca elevated at 10.6 (Alb 1.3); K & Phos wnl, Mg remains 1.9 (goal >2) Renal: Scr 0.42; BUN 11; UOP 0.7 ml/kg/hr + 2 unmeasured occurrences, net +4.8L Pulm: RA Cards: SBP in 140-150s; HR 70s on metoprolol and heparin drip Hepatobil:AST/ALT 248/150 and Alk Phos 290; TBili 0.5; TGs 63 Neuro: No complaints. Pain fairly controlled. ID: WBC down 15.9>17.6; afebrile; Meropenem 4/15>>  TPN Access: PICC requested for today (4/18) TPN start date: 4/18 Nutritional Goals (per RD recommendation on 4/24): Kcal:  2000-2200 kcals (32-36 kcal/kg bw) Protein:  112-125g Pro  (1.8-2g/kg bw) Fluid:  2-2.2 L (1m/kcal)  Goal TPN rate is 83 ml/hr (This TPN will provide 120 g of protein, 269 g of dextrose, and 67 g of lipids which provides 2003  kCals per day, meeting 100% of patient needs)  Current Nutrition:  Soft diet --poor appetite, 0-10% intake Ensure Enlive 237 mL po TID - Patient refused all doses 4/27 Patient now refusing anything by mouth. TPN at 83 ml/hr  Plan:  Continue TPN at goal rate of 83 ml/hr (Surgery requested weaning this past weekend per progress note and ID requesting wean but patient continues to not meet nutritional goals enterally - Surgery said to go back to fullTPN, considering placement of a  feeding tube) This TPN will provide 120 g of protein, 269 g of dextrose, and 67 g of lipids which provides 2003  kCals per day, meeting 100% of patient needs Electrolytes in TPN: Standard except increase Na and increase Mg Add MVI to TPN - trace elements are on back order, will only place in TPN on Monday, Wednesday and Friday Continue  sensitive SSI and adjust as needed Add Thiamine and Folate toTPN Monitor TPN labs Mon and Thurs.  F/u daily BMP as ordered by MD F/u enteral intake and ability to advance diet as able - would like for this patient to be able to stop TPN with ongoing infectious process but unable to do so at this time.  Alanda Slim, PharmD, [facility] Clinical Pharmacist Please see AMION for all Pharmacists' Contact Phone Numbers 07/28/2018, 7:33 AM

## 2018-07-28 NOTE — Care Management Important Message (Signed)
Important Message  Patient Details  Name: AALYA BADY MRN: [id number] Date of Birth: 22-Nov-1945   Medicare Important Message Given:  Yes    Joane Postel Stefan Church 07/28/2018, 9:39 AM

## 2018-07-28 NOTE — Progress Notes (Signed)
ANTICOAGULATION CONSULT NOTE - Follow Up Consult  Pharmacy Consult for Heparin + Merrem Indication: Afib + colon perforation  No Known Allergies  Patient Measurements: Height: 5\' 7"  (170.2 cm) Weight: 140 lb 3.4 oz (63.6 kg) IBW/kg (Calculated) : 61.6 Heparin Dosing Weight: 64.2 kg  Vital Signs: Temp: 99.2 F (37.3 C) (04/28 0830) Temp Source: Oral (04/28 0830) BP: 132/45 (04/28 0830) Pulse Rate: 93 (04/28 0835)  Labs: Recent Labs    07/26/18 0239 07/26/18 0904 07/27/18 0458 07/28/18 0307 07/28/18 0725  HGB 7.6*  --  7.7* 7.9*  --   HCT 24.8*  --  25.2* 25.6*  --   PLT 367  --  422* 386  --   HEPARINUNFRC 0.41  --  0.37 0.39  --   CREATININE  --  0.41* 0.42*  --  0.41*    Estimated Creatinine Clearance: 62.7 mL/min (A) (by C-G formula based on SCr of 0.41 mg/dL (L)).   Assessment:  Anticoag: Eliquis PTA>Heparin. New afib, unclear when the DOAC started PTA but not at the last clinic visit. S/p GI procedure and left abd abscess drain placement 4/16. Heparin resumed 4/17.Chad2vasc score 6. - HL 0.39 at goal. Hgb 7.9 low but stable. Plts 386 ok.   Goal of Therapy:  Heparin level 0.3-0.7 units/ml Monitor platelets by anticoagulation protocol: Yes   Plan:  Heparin 1200 units/hr Daily HL and CBC   [name] S. [name], PharmD, BCPS Clinical Staff Pharmacist [name] 07/28/2018,8:46 AM

## 2018-07-28 NOTE — Progress Notes (Signed)
Nutrition Follow-up  DOCUMENTATION CODES:   Severe malnutrition in context of acute illness/injury  INTERVENTION:   If within goals of care recommend placement of Cortrak and initiation of enteral nutrition.   -Osmolite 1.5 @ 20 ml/hr  -Increase by 10 ml Q6 hours to goal rate of 55 ml/hr (1320 ml) -30 ml Prostat BID  At goal TF provides: 2180 kcal, 113 grams protein, and 1006 ml free water. Meets 100% of needs.   -Continue TPN @ 83 ml/hr- management per pharmacy  -Continue Ensure Enlive po TID, each supplement provides 350 kcal and 20 grams of protein  NUTRITION DIAGNOSIS:   Severe Malnutrition related to acute illness(probable ischemic colitis) as evidenced by (21% wt loss in 4 months and an estimated energy intake < or equal to 50% for > or equal to 5 days.)  Ongoing  GOAL:   Patient will meet greater than or equal to 90% of their needs  Met with TPN  MONITOR:   PO intake, Supplement acceptance, Labs, Skin, I & O's, Weight trends  REASON FOR ASSESSMENT:   Consult New TPN/TNA  ASSESSMENT:   73 y/o female PMHx GERD, HLD, HTN, PVD s/p aortofem bypass, s/p RLE thromboembolectomy and R common emoral artery patch angioplasty 05/31/18, and tobacco abuse. Presents this admission with sepsis secondary to PNA and probable ischemic colitis.   4/14- flex sig  4/15- repeat CT revealed self contained perforation.NPO 4/16- exlap, LOA, L sigmoid colectomy w/ colostomy and 2x JPdrain placement, midline wound left open 4/17- NGT pulled out by pt 4/19 TPN start 4/24- full liquids 4/26- soft diet  RD working remotely. Unable to reach pt by phone x 2. Spoke with RN via phone. Pt is taking medications, taking sips of Ensure, and refusing meals. Meal completions charted as 0-10% for pt's last eight meals. Spoke with CCM regarding nutrition status. They plan to assess stomach further and to speak with family to see if they wish to have feeding tube placed.   Pt tolerating TPN @ 83  ml/hr- provides 2003 kcal and 120 g protein.   Weight noted to increase from 63.4 kg on 4/24 to 63.6 kg today.   I/O: +5,592 ml since 4/14  UOP: none recorded  Drains: 11 ml x 24 hrs   Medications: colace, SS novolog, miralax Labs: LFTs increased, CBG 107-139   Diet Order:   Diet Order            DIET SOFT Room service appropriate? No; Fluid consistency: Thin  Diet effective now              EDUCATION NEEDS:   Education needs have been addressed  Skin:  Skin Assessment: Skin Integrity Issues: Skin Integrity Issues:: Incisions, Other (Comment) Incisions: OPEN Surgical incision to R + L abdomen (closed at fascia) and to R groin Other: Venous statis ulcer to L toe  Last BM:  4/27- 50 ml via colostomy   Height:   Ht Readings from Last 1 Encounters:  07/14/18 5' 7"  (1.702 m)    Weight:   Wt Readings from Last 1 Encounters:  07/28/18 63.6 kg    Ideal Body Weight:  61.4 kg  BMI:  Body mass index is 21.96 kg/m.  Estimated Nutritional Needs:   Kcal:  2000-2200 kcals (32-36 kcal/kg bw)  Protein:  112-125g Pro  (1.8-2g/kg bw)  Fluid:  2-2.2 L (59m/kcal)  CMariana SingleRD, LDN Clinical Nutrition Pager # [phone]

## 2018-07-28 NOTE — Progress Notes (Signed)
Daughter, [name], updated by phone.  [name] 11:18 AM 07/28/2018

## 2018-07-28 NOTE — Progress Notes (Addendum)
Patient more than lethargic than usual and has a jerky movement. Not easily aroused. BP 142/74 HR 88 RR 18 O2 96. MD page via Amion system at this time.   1711: MD at bedside. CT head  ordered

## 2018-07-29 DIAGNOSIS — R0902 Hypoxemia: Secondary | ICD-10-CM

## 2018-07-29 DIAGNOSIS — R6521 Severe sepsis with septic shock: Secondary | ICD-10-CM

## 2018-07-29 LAB — COMPREHENSIVE METABOLIC PANEL
ALT: 90 U/L — ABNORMAL HIGH (ref 0–44)
AST: 104 U/L — ABNORMAL HIGH (ref 15–41)
Albumin: 1.3 g/dL — ABNORMAL LOW (ref 3.5–5.0)
Alkaline Phosphatase: 269 U/L — ABNORMAL HIGH (ref 38–126)
Anion gap: 9 (ref 5–15)
BUN: 15 mg/dL (ref 8–23)
CO2: 21 mmol/L — ABNORMAL LOW (ref 22–32)
Calcium: 8.3 mg/dL — ABNORMAL LOW (ref 8.9–10.3)
Chloride: 105 mmol/L (ref 98–111)
Creatinine, Ser: 0.36 mg/dL — ABNORMAL LOW (ref 0.44–1.00)
GFR calc Af Amer: >60 mL/min (ref >60)
GFR calc non Af Amer: >60 mL/min (ref >60)
Glucose, Bld: 139 mg/dL — ABNORMAL HIGH (ref 70–99)
Potassium: 4.2 mmol/L (ref 3.5–5.1)
Sodium: 135 mmol/L (ref 135–145)
Total Bilirubin: 0.5 mg/dL (ref 0.3–1.2)
Total Protein: 6.2 g/dL — ABNORMAL LOW (ref 6.5–8.1)

## 2018-07-29 LAB — CBC WITH DIFFERENTIAL/PLATELET
Abs Immature Granulocytes: 0.3 10*3/uL — ABNORMAL HIGH (ref 0.00–0.07)
Basophils Absolute: 0.1 10*3/uL (ref 0.0–0.1)
Basophils Relative: 0 %
Eosinophils Absolute: 0.4 10*3/uL (ref 0.0–0.5)
Eosinophils Relative: 3 %
HCT: 23.3 % — ABNORMAL LOW (ref 36.0–46.0)
Hemoglobin: 7.1 g/dL — ABNORMAL LOW (ref 12.0–15.0)
Immature Granulocytes: 2 %
Lymphocytes Relative: 25 %
Lymphs Abs: 3.8 10*3/uL (ref 0.7–4.0)
MCH: 24.5 pg — ABNORMAL LOW (ref 26.0–34.0)
MCHC: 30.5 g/dL (ref 30.0–36.0)
MCV: 80.3 fL (ref 80.0–100.0)
Monocytes Absolute: 2.2 10*3/uL — ABNORMAL HIGH (ref 0.1–1.0)
Monocytes Relative: 14 %
Neutro Abs: 8.4 10*3/uL — ABNORMAL HIGH (ref 1.7–7.7)
Neutrophils Relative %: 56 %
Platelets: 387 10*3/uL (ref 150–400)
RBC: 2.9 MIL/uL — ABNORMAL LOW (ref 3.87–5.11)
RDW: 18.2 % — ABNORMAL HIGH (ref 11.5–15.5)
WBC: 15.1 10*3/uL — ABNORMAL HIGH (ref 4.0–10.5)
nRBC: 0.2 % (ref 0.0–0.2)

## 2018-07-29 LAB — HEPARIN LEVEL (UNFRACTIONATED)
Heparin Unfractionated: 0.13 IU/mL — ABNORMAL LOW (ref 0.30–0.70)
Heparin Unfractionated: 0.33 IU/mL (ref 0.30–0.70)

## 2018-07-29 LAB — GLUCOSE, CAPILLARY
Glucose-Capillary: 111 mg/dL — ABNORMAL HIGH (ref 70–99)
Glucose-Capillary: 118 mg/dL — ABNORMAL HIGH (ref 70–99)
Glucose-Capillary: 120 mg/dL — ABNORMAL HIGH (ref 70–99)
Glucose-Capillary: 124 mg/dL — ABNORMAL HIGH (ref 70–99)
Glucose-Capillary: 125 mg/dL — ABNORMAL HIGH (ref 70–99)
Glucose-Capillary: 132 mg/dL — ABNORMAL HIGH (ref 70–99)

## 2018-07-29 MED ORDER — TRAVASOL 10 % IV SOLN
INTRAVENOUS | Status: AC
  Administered 2018-07-29: 17:00:00 via INTRAVENOUS
  Filled 2018-07-29: qty 1195.2

## 2018-07-29 MED ORDER — PREGABALIN 50 MG PO CAPS
50.0000 mg | ORAL_CAPSULE | Freq: Two times a day (BID) | ORAL | Status: DC
  Administered 2018-07-29 – 2018-08-08 (×19): 50 mg via ORAL
  Filled 2018-07-29 (×19): qty 1

## 2018-07-29 MED ORDER — MORPHINE SULFATE (PF) 2 MG/ML IV SOLN
2.0000 mg | INTRAVENOUS | Status: DC | PRN
  Administered 2018-07-30: 04:00:00 2 mg via INTRAVENOUS
  Filled 2018-07-29: qty 1

## 2018-07-29 NOTE — Progress Notes (Signed)
Patient ID: [name], female   DOB: [birth year], 73 y.o.   MRN: [id number]    13 Days Post-Op  Subjective: Patient is pleasantly confused.  Unsure of the year, where she is, or who the president is.  She is not eating or drinking anything as she "has no appetite"  Objective: Vital signs in last 24 hours: Temp:  [98.5 F (36.9 C)-99.2 F (37.3 C)] 98.5 F (36.9 C) (04/29 0236) Pulse Rate:  [74-87] 80 (04/29 0236) Resp:  [14-23] 20 (04/29 0400) BP: (113-145)/(49-90) 132/58 (04/29 0400) SpO2:  [92 %-96 %] 96 % (04/29 0236) Weight:  [62.7 kg] 62.7 kg (04/29 0500) Last BM Date: 07/22/18  Intake/Output from previous day: 04/28 0701 - 04/29 0700 In: 2504.6 [I.V.:2104.6; IV Piggyback:400] Out: 3600 [Urine:3600] Intake/Output this shift: Total I/O In: -  Out: 500 [Urine:500]  PE: Abd: soft, appropriately tender, midline wound is stable from yesterday, still with fibrin at the superior aspect and dehiscence at the inferior aspect.  Colostomy is pink and viable with some air, but no stool yet.  Both JP drains with essentially no output or minimal serous fluid.  Lab Results:  Recent Labs    07/28/18 1820 07/29/18 0701  WBC 15.4* 15.1*  HGB 7.1* 7.1*  HCT 23.7* 23.3*  PLT 395 387   BMET Recent Labs    07/28/18 0725 07/28/18 1820  NA 135 137  K 4.5 4.6  CL 106 107  CO2 24 22  GLUCOSE 120* 107*  BUN 14 14  CREATININE 0.41* 0.37*  CALCIUM 8.3* 8.5*   PT/INR Recent Labs    07/28/18 1820  LABPROT 14.5  INR 1.1   CMP     Component Value Date/Time   NA 137 07/28/2018 1820   NA 142 05/22/2018 1039   K 4.6 07/28/2018 1820   CL 107 07/28/2018 1820   CO2 22 07/28/2018 1820   GLUCOSE 107 (H) 07/28/2018 1820   BUN 14 07/28/2018 1820   BUN 10 05/22/2018 1039   CREATININE 0.37 (L) 07/28/2018 1820   CREATININE 0.72 08/07/2012 1016   CALCIUM 8.5 (L) 07/28/2018 1820   PROT 6.3 (L) 07/28/2018 1820   PROT 6.8 05/22/2018 1039   ALBUMIN 1.4 (L) 07/28/2018 1820   ALBUMIN 3.6 (L) 05/22/2018 1039   AST 170 (H) 07/28/2018 1820   ALT 125 (H) 07/28/2018 1820   ALKPHOS 262 (H) 07/28/2018 1820   BILITOT 0.5 07/28/2018 1820   BILITOT 0.3 05/22/2018 1039   GFRNONAA >60 07/28/2018 1820   GFRNONAA 88 08/07/2012 1016   GFRAA >60 07/28/2018 1820   GFRAA >89 08/07/2012 1016   Lipase     Component Value Date/Time   LIPASE 65 (H) 07/08/2018 1325       Studies/Results: Ct Head Wo Contrast  Result Date: 07/28/2018 CLINICAL DATA:  Unexplained altered level of consciousness. EXAM: CT HEAD WITHOUT CONTRAST TECHNIQUE: Contiguous axial images were obtained from the base of the skull through the vertex without intravenous contrast. COMPARISON:  07/07/2018.  05/30/2018. FINDINGS: Brain: No change since the previous study. Brainstem and cerebellum appear unremarkable. Cerebral hemispheres show a few old small vessel infarctions within the basal ganglia and deep white matter. No cortical or large vessel territory infarction. No mass lesion, hemorrhage hydrocephalus or extra-axial collection. Vascular: There is atherosclerotic calcification of the major vessels at the base of the brain. Skull: Negative Sinuses/Orbits: Posterior nasal polyp again seen on the left, measuring about 2.5 cm in maximal size. Chronic inflammatory changes of the left division  of the sphenoid sinus. Other sinuses negative. Orbits negative. Benign venous calcification in the left orbit not significant. Other: None IMPRESSION: No change or acute finding. 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 Chronic left sphenoid sinusitis. Chronic left posterior nasal polyp or polypoid mass. Electronically Signed   By: Paulina Fusi M.D.   On: 07/28/2018 17:41   Dg Kayleen Memos W Single Cm (sol Or Thin Ba)  Result Date: 07/27/2018 CLINICAL DATA:  Apparent defect in the gastric wall on CT scan of 07/23/2018. EXAM: WATER SOLUBLE UPPER GI SERIES TECHNIQUE: Single-column  upper GI series was performed using water soluble contrast. CONTRAST:  38mL OMNIPAQUE IOHEXOL 300 MG/ML  SOLN COMPARISON:  CT scan 07/23/2018 FLUOROSCOPY TIME:  Fluoroscopy Time:  3 minutes and 18 seconds. Radiation Exposure Index (if provided by the fluoroscopic device): 91 mGy Number of Acquired Spot Images: FINDINGS: The patient was reluctant to drink the water-soluble contrast. After coaching, encouraging, and pleading with the patient, she refused to drink a sufficient quantity to obtain a diagnostic study. We repositioned the patient as much as possible in attempts to move the contrast material to the area of abnormal gastric wall seen on CT, but this was not possible given the location of the apparent mural defect. IMPRESSION: Nondiagnostic study. Careful review of the CT scan from 07/23/2018 shows an apparent mural defect in the cranial wall of the mid stomach, adjacent to where the drain passes over the top of the stomach. As noted above, the patient was unwilling to consume enough contrast to fill the stomach sufficiently to assess this portion of the gastric wall. Despite concerted effort with repositioning, we were unable to move the contrast pool to area of concern. CT scan of the abdomen may help if water soluble contrast administered during the study can be demonstrated outside of the stomach. Electronically Signed   By: Kennith Center M.D.   On: 07/27/2018 16:28    Anti-infectives: Anti-infectives (From admission, onward)   Start     Dose/Rate Route Frequency Ordered Stop   07/28/18 1000  fluconazole (DIFLUCAN) tablet 100 mg     100 mg Oral Daily 07/27/18 1219 08/04/18 0959   07/27/18 1400  fluconazole (DIFLUCAN) IVPB 200 mg     200 mg 100 mL/hr over 60 Minutes Intravenous  Once 07/27/18 1219 07/27/18 1426   07/15/18 1700  meropenem (MERREM) 1 g in sodium chloride 0.9 % 100 mL IVPB     1 g 200 mL/hr over 30 Minutes Intravenous Every 8 hours 07/15/18 1620     07/08/18 2300  vancomycin  (VANCOCIN) 1,500 mg in sodium chloride 0.9 % 500 mL IVPB  Status:  Discontinued     1,500 mg 250 mL/hr over 120 Minutes Intravenous  Once 07/07/18 2128 07/07/18 2129   07/08/18 2300  vancomycin (VANCOCIN) 1,500 mg in sodium chloride 0.9 % 500 mL IVPB  Status:  Discontinued     1,500 mg 250 mL/hr over 120 Minutes Intravenous Every 24 hours 07/07/18 2129 07/08/18 0726   07/08/18 0600  ceFEPIme (MAXIPIME) 2 g in sodium chloride 0.9 % 100 mL IVPB  Status:  Discontinued     2 g 200 mL/hr over 30 Minutes Intravenous Every 8 hours 07/07/18 2124 07/13/18 1333   07/07/18 2130  vancomycin (VANCOCIN) 1,500 mg in sodium chloride 0.9 % 500 mL IVPB     1,500 mg 250 mL/hr over 120 Minutes Intravenous  Once 07/07/18 2123 07/08/18 0028   07/07/18 2115  vancomycin (VANCOCIN) IVPB 1000 mg/200 mL premix  Status:  Discontinued     1,000 mg 200 mL/hr over 60 Minutes Intravenous  Once 07/07/18 2110 07/07/18 2128   07/07/18 2115  ceFEPIme (MAXIPIME) 2 g in sodium chloride 0.9 % 100 mL IVPB     2 g 200 mL/hr over 30 Minutes Intravenous  Once 07/07/18 2110 07/07/18 2203       Assessment/Plan A. Fibonheparin gtt PVD/mesenteric ischemia - possible arteriogram by vascular prior to DC S/pright femoral thrombectomy on 3/1/2020and aortobifem 2002  HTN HLD COPD Tobacco abuse- quit smoking on admission Chronic back pain, 30-50% stenosis SMA Severe malnutrition - prealbumin5.5, multiple factorial from surgery and odynophagia from tongue sores Hypomagnesemia -resolved Leukocytosis- 15K    Suspected chronic mesenteric ischemia Descending colon perforation on CT s/p Ex Lap, LOA, Left sigmoid colectomy, descending colostomy (Hartmann's), Dr. Derrell Lollingamirez, 4/16POD #13 -TID WTD dressing changesto help with necrotic tissue at base of wound -cont on soft diet and ensure although patient not eating anything.  Can try magic cup -colostomy with air present, no stool yet. continue stool softener given hard  stool palpated on exam -add colace BID and miralax -mobilize and pulm toilet  -[facility] and poor PO intake.  LFTs essentially stable, but if continue to trend up may have to stop TNA.  Patient unable to do UGI yesterday to assure no gastric injury.  May need an feeding tube placed given her inability to maintain her nutrition on her own.  Will see what LFTs do tomorrow.  FEN-soft diet/ensure,IVF,TPN, colace/miralax VTE -SCDs, heparindrip ID -Meropenem 4/15 >> Foley - Removed POD 1 Follow up -Dr. Derrell Lollingamirez POC: Dellis Filbertammy Kallam [phone] daughter/[name] [phone].    LOS: 22 days    Letha CapeKelly E Arthella Headings , [facility] Center[facility]-C [facility] 07/29/2018, 9:35 AM Pager: [phone]

## 2018-07-29 NOTE — Consult Note (Signed)
WOC Nurse wound consult note Patient receiving care in [facility] 4E07. Reason for Consult: "new sacral wound" Wound type: MASD fissure at the top of the gluteal fold at the level of the coccyx Measurement: 1.1 cm x 0.2 cm Wound bed: pink Drainage (amount, consistency, odor) none Periwound: intact Dressing procedure/placement/frequency: Cleanse coccyx area with soap and water. Pat dry. Apply a small piece of Xeroform gauze [place] # 295) to the fissure at the top of the gluteal fold at the coccyx.  Cover with a foam dressing.  Change the Xeroform daily.  The foam can be left in place 3 days. Monitor the wound area(s) for worsening of condition such as: Signs/symptoms of infection,  Increase in size,  Development of or worsening of odor, Development of pain, or increased pain at the affected locations.  Notify the medical team if any of these develop.  Thank you for the consult.  Discussed plan of care with the patient and bedside nurse.  WOC nurse will not follow at this time.  Please re-consult the WOC team if needed.  Helmut Muster, RN, MSN, CWOCN, CNS-BC, pager [phone]

## 2018-07-29 NOTE — Progress Notes (Signed)
PHARMACY - ADULT TOTAL PARENTERAL NUTRITION CONSULT NOTE   Pharmacy Consult for TPN Indication: Post-op ileus  Patient Measurements: Height: 5' 7"  (170.2 cm) Weight: 138 lb 3.7 oz (62.7 kg) IBW/kg (Calculated) : 61.6 TPN AdjBW (KG): 62.3 Body mass index is 21.65 kg/m.  Assessment: 73-yo female with h/o chronic N/V/abdominal pain, A. fib, PVD status post right femoral thrombectomy on 05/31/2018, hypertension and chronic back pain presenting with intractable nausea, vomiting, abdominal pain, new onset of diarrhea and about 30 pounds weight loss in the last several months.   She was admitted to [facility] with a diagnosis of sepsis. 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 Also there is a 3.9 x 1.8 cm and 2.6 x 1.1 cm fluid collection in right pelvis/groin thought to be seroma from prior surgery. IR at [facility] was consulted for stenting her SMA which has about a (30-50% stenosis) to improve mesenteric blood flow.   Patient was transferred to [facility] for further management. S/p upper endoscopy 07/14/18 found Severe ulcerative gastritis most consistent ischemia, biopsied, results pending. CT showed perforation of descending colon. 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 Now with post-op ileus in the setting of 30 pound weight loss and minimal intake since admission.   GI: Open abdomen. Small abscesses on CT. 2 x JP drains with 5 mls output, nothing from colostomy, LBM 4/22-adding Colace; Prealbumin >5 to 5.5. NGT pulled out 4/17- no plan to replace at this time. Patient reports continued nausea  drank Ensure x 2 yesterday (no emesis charted), refusing meals by mouth.  Endo: No history of diabetes; CBGs <180 Insulin requirements in the past 24 hours: 1 unit Lytes: Corr Ca elevated at 10.6 (Alb 1.3); K & Phos wnl, Mg remains 1.9 (goal >2) Renal: Scr 0.42; BUN 11; UOP 0.7 ml/kg/hr + 2 unmeasured occurrences, net +4.8L Pulm: RA Cards: SBP in 140-150s; HR 70s on metoprolol and heparin drip Hepatobil:AST/ALT 248/150 and Alk Phos 290; TBili 0.5; TGs 63 Neuro: No complaints. Pain fairly controlled. ID: WBC down 15.9>17.6; afebrile; Meropenem 4/15>>  TPN Access: PICC requested for today (4/18) TPN start date: 4/18 Nutritional Goals (per RD recommendation on 4/24): Kcal:  2000-2200 kcals (32-36 kcal/kg bw) Protein:  112-125g Pro  (1.8-2g/kg bw) Fluid:  2-2.2 L (77m/kcal)  Goal TPN rate is 83 ml/hr (This TPN will provide 120 g of protein, 269 g of dextrose, and 67 g of lipids which provides 2003  kCals per day, meeting 100% of patient needs)  Current Nutrition:  Soft diet --poor appetite, 0-10% intake Ensure Enlive 237 mL po TID - Patient refused all doses  - drank two on 4/28 TPN at 83 ml/hr  Plan:  Continue TPN at goal rate of 83 ml/hr (Surgery requested weaning this past weekend per progress note and ID requesting wean but patient continues to not meet nutritional goals enterally - Surgery said to go back to fullTPN, considering placement of a feeding tube) This TPN will provide 120 g of protein, 269 g of dextrose, and 67 g of lipids which provides 2003  kCals per day, meeting 100% of patient needs Electrolytes in  TPN: Standard except increase Na and increase Mg Add MVI to TPN - trace elements are on back order, will only place in TPN on Monday, Wednesday and Friday Continue sensitive SSI and adjust as needed Add Thiamine and Folate toTPN Monitor TPN labs Mon and Thurs - watch LFTs closely  F/u enteral intake and ability to advance diet as able - would like for this patient to be  able to stop TPN with ongoing infectious process but unable to do so at this time.  Alanda Slim, PharmD, [facility] Clinical Pharmacist Please see AMION for all Pharmacists' Contact Phone Numbers 07/29/2018, 8:52 AM

## 2018-07-29 NOTE — Progress Notes (Addendum)
Abdominal dressing changed per MD order. Patient tolerated well.  colostomy bag had small brown formed stool emptied with small amount of red/brownish drainage. Unable to measure  Xeroform/foam dressing applied to sacral area per wound RN orders.

## 2018-07-29 NOTE — Progress Notes (Signed)
PROGRESS NOTE    [name]  ZOX:[id number] DOB: 1945-11-02 DOA: 07/07/2018 PCP: Junie Spencer, [name]    Brief Narrative:  73 year old female with history of chronic N/V/abdominal pain, A. fib, PVD status post right femoral thrombectomy on 05/31/2018, hypertension and chronic back pain presenting with intractable nausea, vomiting, abdominal pain, new onset of diarrhea and about 30 pounds weight loss in the last several months.  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 Her left internal iliac artery was patent at that point.  -CTA abdomen and pelvis on 4/9 showed occlusion of celiac axis, or opacification of SMA and right iliac limb, patent SMV, colonic wall thickenings, rim enhancing 2.5 x 3.3 cm fluid collection around pancreatic tail continuous with a 3.1 x 1.0 cm rim-enhancing fluid collection along the anterior margin of the upper pole of left kidney. Also there is a 3.9 x 1.8 cm and 2.6 x 1.1 cm fluid collection in right pelvis/groin thought to be seroma from prior surgery.  -Patient was transferred to [facility] for further management.  Patient was also treated with cefepime for presumed pneumonia.  Gastroenterology and vascular surgery consulted.  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  Biopsies taken.  She also had flexible sigmoidoscopy that showed only a 7 mm sessile polyp in the rectum but poor prep. -On 4/15, and continued to have significant leukocytosis, mild temperature and abdominal tenderness,  repeat CT abdomen and pelvis which showed frank perforation of the descending colon appears to be contained within a portion of the omentum, or occluded celiac axis, slightly decreased pancreatic tail lesion but no previously noted fluid collections.  General surgery and IR consulted. -Drain placement by IR, and ex lap, LOA, left sigmoid colectomy  with descending colostomy (Hartmann's) by general surgery, Dr. Derrell Lolling on 4/16.  Abdomen left open out of concern for infectious process and ileus. Started on TPN on 07/18/2018.  Continues to have minimal oral intake.  Assessment & Plan:  Contained perforated descending colon/ chronic mesenteric ischemia -S/p IR drain placement on 4/16 which showed a large left colon defect,  -4/16 S/p Ex Lap, LOA, Left sigmoid colectomy, descending colostomy (Hartmann's), Dr. Derrell Lolling -Postop ileus, open wound -Continue wet-to-dry dressings, general surgery following -No BM yet, on bowel regimen -Also has TNA ongoing, LFTs had worsened but appears to be improving now --Continue meropenem 4/15--per ID.   Dr. 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 -Likely from occluded celiac axis -Continue PPI -Vascular surgery following per above  Significant weight loss/moderate protein calorie malnutrition: -Resulting from chronic bowel ischemia -TPN as tolerated -Colon biopsy from the surgery was negative for any malignancy, biopsy from body of stomach were suggestive of intestinal metaplasia but no malignancy.  History of COPD: Stable -Continue DuoNeb as needed   Peripheral vascular disease with dry gangrene: Stable. -Continue home statin -Outpatient vascular surgery follow-up -On therapeutic anticoagulation with IV heparin per pharmacy  Essential hypertension: Fairly controlled. -Continue home meds  Chronic atrial fibrillation: Not in RVR.   Chad2vasc score 6. -On Eliquis and metoprolol at home. -Continued on Heparin and metoprolol -Remains rate controlled  Chronic pain: -Lower narcotics and Lyrica dose due to oversedation  Tobacco use disorder: -Cessation  counseling -Continue Nicotine patch. Stable  Oral thrush. We will treat with Diflucan.  Acute toxic/metabolic encephalopathy -Resolved, due to polypharmacy -Decrease narcotics and Lyrica dose  DVT prophylaxis: Heparin gtt Code Status: Full Family Communication: no Family at bedside, will discussed with daughter  Disposition Plan: Uncertain at this time  Consultants:   Vascular surgery  Gastroenterology  Infectious disease  Interventional radiology  General surgery  Procedures:   EGD on 4/14  Flex sigmoidoscopy on 4/14  IR drain placement on 4/16 which showed a large left colon defect, 4/16  Ex Lap, LOA, Left sigmoid colectomy, descending colostomy (Hartmann's), Dr. Derrell Lollingamirez, 4/16  Antimicrobials: Anti-infectives (From admission, onward)   Start     Dose/Rate Route Frequency Ordered Stop   07/28/18 1000  fluconazole (DIFLUCAN) tablet 100 mg     100 mg Oral Daily 07/27/18 1219 08/04/18 0959   07/27/18 1400  fluconazole (DIFLUCAN) IVPB 200 mg     200 mg 100 mL/hr over 60 Minutes Intravenous  Once 07/27/18 1219 07/27/18 1426   07/15/18 1700  meropenem (MERREM) 1 g in sodium chloride 0.9 % 100 mL IVPB     1 g 200 mL/hr over 30 Minutes Intravenous Every 8 hours 07/15/18 1620     07/08/18 2300  vancomycin (VANCOCIN) 1,500 mg in sodium chloride 0.9 % 500 mL IVPB  Status:  Discontinued     1,500 mg 250 mL/hr over 120 Minutes Intravenous  Once 07/07/18 2128 07/07/18 2129   07/08/18 2300  vancomycin (VANCOCIN) 1,500 mg in sodium chloride 0.9 % 500 mL IVPB  Status:  Discontinued     1,500 mg 250 mL/hr over 120 Minutes Intravenous Every 24 hours 07/07/18 2129 07/08/18 0726   07/08/18 0600  ceFEPIme (MAXIPIME) 2 g in sodium chloride 0.9 % 100 mL IVPB  Status:  Discontinued     2 g 200 mL/hr over 30 Minutes Intravenous Every 8 hours 07/07/18 2124 07/13/18 1333   07/07/18 2130  vancomycin (VANCOCIN) 1,500 mg in sodium chloride 0.9 % 500 mL IVPB     1,500 mg 250 mL/hr over  120 Minutes Intravenous  Once 07/07/18 2123 07/08/18 0028   07/07/18 2115  vancomycin (VANCOCIN) IVPB 1000 mg/200 mL premix  Status:  Discontinued     1,000 mg 200 mL/hr over 60 Minutes Intravenous  Once 07/07/18 2110 07/07/18 2128   07/07/18 2115  ceFEPIme (MAXIPIME) 2 g in sodium chloride 0.9 % 100 mL IVPB     2 g 200 mL/hr over 30 Minutes Intravenous  Once 07/07/18 2110 07/07/18 2203      Subjective: -Complains of nausea, oral intake is poor, more alert -Denies any pain  Objective: Vitals:   07/29/18 0925 07/29/18 0930 07/29/18 0935 07/29/18 1042  BP:    138/71  Pulse:      Resp: 14 16 18 17   Temp:      TempSrc:      SpO2:      Weight:      Height:        Intake/Output Summary (Last 24 hours) at 07/29/2018 1323 Last data filed at 07/29/2018 [id number] Gross per 24 hour  Intake 2504.56 ml  Output 4100 ml  Net -1595.44 ml   Filed Weights   07/26/18 0401 07/28/18 0500 07/29/18 0500  Weight: 64.2 kg 63.6 kg 62.7 kg    Examination:  Gen: Awake, Alert, Oriented X oriented to self and place only, frail, chronically ill-appearing HEENT: PERRLA, Neck supple, no JVD Lungs: Clear bilaterally,  normal respiratory effort CVS: RRR,No Gallops,Rubs or new Murmurs Abd: Soft, nondistended, large abdominal binder over dressing, bowel sounds present Extremities: No edema Skin: no new rashes Data Reviewed: I have personally reviewed following labs and imaging studies  CBC: Recent Labs  Lab 07/26/18 0239 07/27/18 0458 07/28/18 0307 07/28/18 1820 07/29/18 0701  WBC 18.8* 15.9* 17.6* 15.4* 15.1*  NEUTROABS  --  10.8*  --  8.7* 8.4*  HGB 7.6* 7.7* 7.9* 7.1* 7.1*  HCT 24.8* 25.2* 25.6* 23.7* 23.3*  MCV 81.3 82.1 81.3 82.3 80.3  PLT 367 422* 386 395 387   Basic Metabolic Panel: Recent Labs  Lab 07/23/18 0548 07/24/18 0347 07/26/18 0904 07/27/18 0458 07/28/18 0725 07/28/18 1820 07/29/18 1016  NA 133* 132* 133* 136 135 137 135  K 4.5 4.3 4.6 4.6 4.5 4.6 4.2  CL 103 103 101  105 106 107 105  CO2 21*  GLUCOSE 130* 116* 129* 121* 120* 107* 139*  BUN CREATININE 0.36* 0.40* 0.41* 0.42* 0.41* 0.37* 0.36*  CALCIUM 8.1* 7.9* 8.5* 8.4* 8.3* 8.5* 8.3*  MG 1.7 1.9  --  1.9  --   --   --   PHOS 3.1  --   --  3.9  --   --   --    GFR: Estimated Creatinine Clearance: 62.7 mL/min (A) (by C-G formula based on SCr of 0.36 mg/dL (L)). Liver Function Tests: Recent Labs  Lab 07/23/18 0548 07/27/18 0458 07/28/18 0725 07/28/18 1820 07/29/18 1016  AST 69* 254* 248* 170* 104*  ALT 35 130* 150* 125* 90*  ALKPHOS 267* 328* 290* 262* 269*  BILITOT 0.6 0.4 0.5 0.5 0.5  PROT 5.1* 6.0* 5.9* 6.3* 6.2*  ALBUMIN 1.3* 1.3* 1.3* 1.4* 1.3*   No results for input(s): LIPASE, AMYLASE in the last 168 hours. Recent Labs  Lab 07/28/18 1820  AMMONIA 31   Coagulation Profile: Recent Labs  Lab 07/28/18 1820  INR 1.1   Cardiac Enzymes: No results for input(s): CKTOTAL, CKMB, CKMBINDEX, TROPONINI in the last 168 hours. BNP (last 3 results) No results for input(s): PROBNP in the last 8760 hours. HbA1C: No results for input(s): HGBA1C in the last 72 hours. CBG: Recent Labs  Lab 07/28/18 1950 07/29/18 0010 07/29/18 0424 07/29/18 0750 07/29/18 1220  GLUCAP 114* 120* 125* 111* 118*   Lipid Profile: Recent Labs    07/27/18 0458  TRIG 63   Thyroid Function Tests: Recent Labs    07/28/18 1801 07/28/18 1820  TSH 3.614  --   FREET4  --  0.95   Anemia Panel: No results for input(s): VITAMINB12, FOLATE, FERRITIN, TIBC, IRON, RETICCTPCT in the last 72 hours. Sepsis Labs: No results for input(s): PROCALCITON, LATICACIDVEN in the last 168 hours.  No results found for this or any previous visit (from the past 240 hour(s)).   Radiology Studies: Ct Head Wo Contrast  Result Date: 07/28/2018 CLINICAL DATA:  Unexplained altered level of consciousness. EXAM: CT HEAD WITHOUT CONTRAST TECHNIQUE: Contiguous axial images were obtained from  the base of the skull through the vertex without intravenous contrast. COMPARISON:  07/07/2018.  05/30/2018. FINDINGS: Brain: No change since the previous study. Brainstem and cerebellum appear unremarkable. Cerebral hemispheres show a few old small vessel infarctions within the basal ganglia and deep white matter. No cortical or large vessel territory infarction. No mass lesion, hemorrhage hydrocephalus or extra-axial collection. Vascular: There is atherosclerotic calcification of the major vessels at  the base of the brain. Skull: Negative Sinuses/Orbits: Posterior nasal polyp again seen on the left, measuring about 2.5 cm in maximal size. Chronic inflammatory changes of the left division of the sphenoid sinus. Other sinuses negative. Orbits negative. Benign venous calcification in the left orbit not significant. Other: None IMPRESSION: No change or acute finding. 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 Chronic left sphenoid sinusitis. Chronic left posterior nasal polyp or polypoid mass. Electronically Signed   By: Paulina Fusi M.D.   On: 07/28/2018 17:41   Dg Kayleen Memos W Single Cm (sol Or Thin Ba)  Result Date: 07/27/2018 CLINICAL DATA:  Apparent defect in the gastric wall on CT scan of 07/23/2018. EXAM: WATER SOLUBLE UPPER GI SERIES TECHNIQUE: Single-column upper GI series was performed using water soluble contrast. CONTRAST:  89mL OMNIPAQUE IOHEXOL 300 MG/ML  SOLN COMPARISON:  CT scan 07/23/2018 FLUOROSCOPY TIME:  Fluoroscopy Time:  3 minutes and 18 seconds. Radiation Exposure Index (if provided by the fluoroscopic device): 91 mGy Number of Acquired Spot Images: FINDINGS: The patient was reluctant to drink the water-soluble contrast. After coaching, encouraging, and pleading with the patient, she refused to drink a sufficient quantity to obtain a diagnostic study. We repositioned the patient as much as possible in attempts to move the contrast material to the  area of abnormal gastric wall seen on CT, but this was not possible given the location of the apparent mural defect. IMPRESSION: Nondiagnostic study. Careful review of the CT scan from 07/23/2018 shows an apparent mural defect in the cranial wall of the mid stomach, adjacent to where the drain passes over the top of the stomach. As noted above, the patient was unwilling to consume enough contrast to fill the stomach sufficiently to assess this portion of the gastric wall. Despite concerted effort with repositioning, we were unable to move the contrast pool to area of concern. CT scan of the abdomen may help if water soluble contrast administered during the study can be demonstrated outside of the stomach. Electronically Signed   By: Kennith Center M.D.   On: 07/27/2018 16:28    Scheduled Meds:  Chlorhexidine Gluconate Cloth  6 each Topical Q0600   docusate sodium  100 mg Oral BID   feeding supplement (ENSURE ENLIVE)  237 mL Oral TID BM   fluconazole  100 mg Oral Daily   insulin aspart  0-9 Units Subcutaneous Q4H   lidocaine  1 patch Transdermal Q24H   magic mouthwash  10 mL Oral QID   metoprolol tartrate  75 mg Oral BID   nicotine  21 mg Transdermal Daily   pantoprazole (PROTONIX) IV  40 mg Intravenous Q24H   polyethylene glycol  17 g Oral Daily   pregabalin  50 mg Oral BID   saccharomyces boulardii  250 mg Oral BID   sodium chloride flush  10-40 mL Intracatheter Q12H   Continuous Infusions:  sodium chloride 10 mL/hr at 07/22/18 2300   heparin 1,400 Units/hr (07/29/18 0830)   meropenem (MERREM) IV 1 g (07/29/18 0522)   methocarbamol (ROBAXIN) IV     TPN ADULT (ION) 83 mL/hr at 07/28/18 1745   TPN ADULT (ION)       LOS: 22 days   Zannie Cove, MD [facility]  07/29/2018, 1:23 PM

## 2018-07-29 NOTE — Progress Notes (Addendum)
Spoke to Teller, patient's daughter, and updated her on surgical status today.  Letha Cape 11:29 AM 07/29/2018

## 2018-07-29 NOTE — Progress Notes (Signed)
ANTICOAGULATION CONSULT NOTE - Follow Up Consult  Pharmacy Consult for Heparin Indication: Afib + colon perforation  No Known Allergies  Patient Measurements: Height: 5\' 7"  (170.2 cm) Weight: 138 lb 3.7 oz (62.7 kg) IBW/kg (Calculated) : 61.6 Heparin Dosing Weight: 64.2 kg  Vital Signs: Temp: 98.5 F (36.9 C) (04/29 0236) Temp Source: Oral (04/29 0236) BP: 132/58 (04/29 0400) Pulse Rate: 80 (04/29 0236)  Labs: Recent Labs    07/27/18 0458 07/28/18 0307 07/28/18 0725 07/28/18 1820 07/29/18 0701  HGB 7.7* 7.9*  --  7.1* 7.1*  HCT 25.2* 25.6*  --  23.7* 23.3*  PLT 422* 386  --  395 387  APTT  --   --   --  52*  --   LABPROT  --   --   --  14.5  --   INR  --   --   --  1.1  --   HEPARINUNFRC 0.37 0.39  --   --  0.13*  CREATININE 0.42*  --  0.41* 0.37*  --     Estimated Creatinine Clearance: 62.7 mL/min (A) (by C-G formula based on SCr of 0.37 mg/dL (L)).   Assessment: 8 yoF with PAF on Eliquis at home, currently on hold for heparin infusion with abdominal procedures. Heparin held briefly yesterday due to AMS and concern for ICH, however head CT negative and heparin resumed overnight. Initial heparin level subtherapeutic. Pt previously therapeutic on 1200 units/hr - will increase slightly and allow heparin to slowly reaccumulate.   Goal of Therapy:  Heparin level 0.3-0.7 units/ml Monitor platelets by anticoagulation protocol: Yes   Plan:  -Increase heparin 1400 units/hr -Recheck heparin level in 8hr   Fredonia Highland, PharmD, BCPS Clinical Pharmacist [phone] Please check AMION for all [facility] Pharm[facility] numbers 07/29/2018

## 2018-07-29 NOTE — Progress Notes (Signed)
ANTICOAGULATION CONSULT NOTE - Follow Up Consult  Pharmacy Consult for Heparin Indication: Afib + colon perforation  No Known Allergies  Patient Measurements: Height: 5\' 7"  (170.2 cm) Weight: 138 lb 3.7 oz (62.7 kg) IBW/kg (Calculated) : 61.6 Heparin Dosing Weight: 64.2 kg  Vital Signs: BP: 138/71 (04/29 1042) Pulse Rate: 84 (04/29 0920)  Labs: Recent Labs    07/28/18 0307 07/28/18 0725 07/28/18 1820 07/29/18 0701 07/29/18 1016 07/29/18 1631  HGB 7.9*  --  7.1* 7.1*  --   --   HCT 25.6*  --  23.7* 23.3*  --   --   PLT 386  --  395 387  --   --   APTT  --   --  52*  --   --   --   LABPROT  --   --  14.5  --   --   --   INR  --   --  1.1  --   --   --   HEPARINUNFRC 0.39  --   --  0.13*  --  0.33  CREATININE  --  0.41* 0.37*  --  0.36*  --     Estimated Creatinine Clearance: 62.7 mL/min (A) (by C-G formula based on SCr of 0.36 mg/dL (L)).   Assessment: 62 yoF with PAF on Eliquis at home, currently on hold for heparin infusion with abdominal procedures. Heparin held briefly yesterday due to AMS and concern for ICH, however head CT negative and heparin resumed overnight.  -heparin level is now at goal on 1400 units/hr   Goal of Therapy:  Heparin level 0.3-0.7 units/ml Monitor platelets by anticoagulation protocol: Yes   Plan:  -No heparin changes needed -Daily heparin level and CBC  Harland German, PharmD Clinical Pharmacist **Pharmacist phone directory can now be found on [email or web address] ([email or web address]).  Listed under [facility].

## 2018-07-29 NOTE — Progress Notes (Addendum)
NEUROLOGY PROGRESS NOTE  Subjective: Patient has improved.  Patient is alert and making jokes.  She knows that is 2020, she knows where she is.  She is able to follow all commands.  Exam: Vitals:   07/29/18 0935 07/29/18 1042  BP:  138/71  Pulse:    Resp: 18 17  Temp:    SpO2:      Physical Exam   HEENT-  Normocephalic, no lesions, without obvious abnormality.  Normal external eye and conjunctiva.   Extremities- Warm, dry and intact Musculoskeletal-no joint tenderness, deformity or swelling Skin-warm and dry, no hyperpigmentation, vitiligo, or suspicious lesions    Neuro:  Mental Status: Alert, oriented, thought content appropriate.  Speech fluent without evidence of aphasia.  Able to follow 3 step commands without difficulty. Cranial Nerves: II:  Visual fields grossly normal,  III,IV, VI: ptosis not present, extra-ocular motions intact bilaterally pupils equal, round, reactive to light and accommodation V,VII: smile symmetric, facial light touch sensation normal bilaterally VIII: hearing normal bilaterally IX,X: Palate rises midline XI: bilateral shoulder shrug XII: midline tongue extension Motor: Right : Upper extremity   5/5    Left:     Upper extremity   5/5  Lower extremity   5/5     Lower extremity   5/5 Tone and bulk:normal tone throughout; no atrophy noted Sensory: Pinprick and light touch intact throughout, bilaterally Plantars: Right: downgoing   Left: downgoing Cerebellar: normal finger-to-nose,and normal heel-to-shin test     Medications:  Scheduled: . Chlorhexidine Gluconate Cloth  6 each Topical Q0600  . docusate sodium  100 mg Oral BID  . feeding supplement (ENSURE ENLIVE)  237 mL Oral TID BM  . fluconazole  100 mg Oral Daily  . insulin aspart  0-9 Units Subcutaneous Q4H  . lidocaine  1 patch Transdermal Q24H  . magic mouthwash  10 mL Oral QID  . metoprolol tartrate  75 mg Oral BID  . nicotine  21 mg Transdermal Daily  . pantoprazole (PROTONIX)  IV  40 mg Intravenous Q24H  . polyethylene glycol  17 g Oral Daily  . pregabalin  50 mg Oral TID  . saccharomyces boulardii  250 mg Oral BID  . sodium chloride flush  10-40 mL Intracatheter Q12H   Continuous: . sodium chloride 10 mL/hr at 07/22/18 2300  . heparin 1,400 Units/hr (07/29/18 0830)  . meropenem (MERREM) IV 1 g (07/29/18 0522)  . methocarbamol (ROBAXIN) IV    . TPN ADULT (ION) 83 mL/hr at 07/28/18 1745  . TPN ADULT (ION)      Pertinent Labs/Diagnostics: White blood cell count of 15.1 which is slightly down from 15.4   Ct Head Wo Contrast  Result Date: 07/28/2018 e IMPRESSION: No change or acute finding. 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 Chronic left sphenoid sinusitis. Chronic left posterior nasal polyp or polypoid mass. Electronically Signed   By: Paulina FusiMark  Shogry M.D.   On: 07/28/2018 17:41       Felicie MornDavid Smith PA-C [facility] [phone]   Assessment: 73 year old female past medical history of atrial fibrillation on heparin drip.  Patient has chronic back pain and was admitted for sepsis due to a eccentric ischemia and perforated descending colon status post IR drain/ex lap/sigmoid colectomy and descending colon ostomy with postop ileus. Impression:  Sepsis Encephalopathy-toxic metabolic  Recommendations: -Continue to recommend holding/limiting pain medications including narcotics and Lyrica - Continue to treat infection  Neurology will sign off at this time please  call with any further questions     07/29/2018, 11:33 AM    NEUROHOSPITALIST ADDENDUM Performed a face to face diagnostic evaluation.   I have reviewed the contents of history and physical exam as documented by PA/ARNP/Resident and agree with above documentation.  I have discussed and formulated the above plan as documented. Edits to the note have been made as needed.  Patient slightly confused on exam, not oriented to  month or year.  Not sure why she is in the hospital.  No motor deficits.  She is alert and awake.  Delirium Metabolic encephalopathy   Georgiana Spinner Kameryn Davern MD [facility] [phone]   If 7pm to 7am, please call on call as listed on AMION.

## 2018-07-30 LAB — CBC WITH DIFFERENTIAL/PLATELET
Abs Immature Granulocytes: 0.31 10*3/uL — ABNORMAL HIGH (ref 0.00–0.07)
Basophils Absolute: 0.1 10*3/uL (ref 0.0–0.1)
Basophils Relative: 0 %
Eosinophils Absolute: 0.3 10*3/uL (ref 0.0–0.5)
Eosinophils Relative: 3 %
HCT: 23.4 % — ABNORMAL LOW (ref 36.0–46.0)
Hemoglobin: 7.1 g/dL — ABNORMAL LOW (ref 12.0–15.0)
Immature Granulocytes: 2 %
Lymphocytes Relative: 24 %
Lymphs Abs: 3.3 10*3/uL (ref 0.7–4.0)
MCH: 24.4 pg — ABNORMAL LOW (ref 26.0–34.0)
MCHC: 30.3 g/dL (ref 30.0–36.0)
MCV: 80.4 fL (ref 80.0–100.0)
Monocytes Absolute: 1.9 10*3/uL — ABNORMAL HIGH (ref 0.1–1.0)
Monocytes Relative: 14 %
Neutro Abs: 7.6 10*3/uL (ref 1.7–7.7)
Neutrophils Relative %: 57 %
Platelets: 403 10*3/uL — ABNORMAL HIGH (ref 150–400)
RBC: 2.91 MIL/uL — ABNORMAL LOW (ref 3.87–5.11)
RDW: 18.5 % — ABNORMAL HIGH (ref 11.5–15.5)
WBC: 13.5 10*3/uL — ABNORMAL HIGH (ref 4.0–10.5)
nRBC: 0.4 % — ABNORMAL HIGH (ref 0.0–0.2)

## 2018-07-30 LAB — COMPREHENSIVE METABOLIC PANEL
ALT: 79 U/L — ABNORMAL HIGH (ref 0–44)
AST: 85 U/L — ABNORMAL HIGH (ref 15–41)
Albumin: 1.4 g/dL — ABNORMAL LOW (ref 3.5–5.0)
Alkaline Phosphatase: 252 U/L — ABNORMAL HIGH (ref 38–126)
Anion gap: 6 (ref 5–15)
BUN: 15 mg/dL (ref 8–23)
CO2: 22 mmol/L (ref 22–32)
Calcium: 8.3 mg/dL — ABNORMAL LOW (ref 8.9–10.3)
Chloride: 106 mmol/L (ref 98–111)
Creatinine, Ser: 0.39 mg/dL — ABNORMAL LOW (ref 0.44–1.00)
GFR calc Af Amer: >60 mL/min (ref >60)
GFR calc non Af Amer: >60 mL/min (ref >60)
Glucose, Bld: 124 mg/dL — ABNORMAL HIGH (ref 70–99)
Potassium: 4.3 mmol/L (ref 3.5–5.1)
Sodium: 134 mmol/L — ABNORMAL LOW (ref 135–145)
Total Bilirubin: 0.3 mg/dL (ref 0.3–1.2)
Total Protein: 6.2 g/dL — ABNORMAL LOW (ref 6.5–8.1)

## 2018-07-30 LAB — CBC
HCT: 26.4 % — ABNORMAL LOW (ref 36.0–46.0)
Hemoglobin: 8.2 g/dL — ABNORMAL LOW (ref 12.0–15.0)
MCH: 25.1 pg — ABNORMAL LOW (ref 26.0–34.0)
MCHC: 31.1 g/dL (ref 30.0–36.0)
MCV: 80.7 fL (ref 80.0–100.0)
Platelets: 386 10*3/uL (ref 150–400)
RBC: 3.27 MIL/uL — ABNORMAL LOW (ref 3.87–5.11)
RDW: 17.3 % — ABNORMAL HIGH (ref 11.5–15.5)
WBC: 10.9 10*3/uL — ABNORMAL HIGH (ref 4.0–10.5)
nRBC: 0.6 % — ABNORMAL HIGH (ref 0.0–0.2)

## 2018-07-30 LAB — IRON AND TIBC
Iron: 11 ug/dL — ABNORMAL LOW (ref 28–170)
Saturation Ratios: 5 % — ABNORMAL LOW (ref 10.4–31.8)
TIBC: 204 ug/dL — ABNORMAL LOW (ref 250–450)
UIBC: 193 ug/dL

## 2018-07-30 LAB — HEPARIN LEVEL (UNFRACTIONATED): Heparin Unfractionated: 0.26 IU/mL — ABNORMAL LOW (ref 0.30–0.70)

## 2018-07-30 LAB — PREPARE RBC (CROSSMATCH)

## 2018-07-30 LAB — RETICULOCYTES
Immature Retic Fract: 47.6 % — ABNORMAL HIGH (ref 2.3–15.9)
RBC.: 2.71 MIL/uL — ABNORMAL LOW (ref 3.87–5.11)
Retic Count, Absolute: 58.8 10*3/uL (ref 19.0–186.0)
Retic Ct Pct: 2.2 % (ref 0.4–3.1)

## 2018-07-30 LAB — MAGNESIUM: Magnesium: 1.6 mg/dL — ABNORMAL LOW (ref 1.7–2.4)

## 2018-07-30 LAB — GLUCOSE, CAPILLARY
Glucose-Capillary: 108 mg/dL — ABNORMAL HIGH (ref 70–99)
Glucose-Capillary: 111 mg/dL — ABNORMAL HIGH (ref 70–99)
Glucose-Capillary: 114 mg/dL — ABNORMAL HIGH (ref 70–99)
Glucose-Capillary: 123 mg/dL — ABNORMAL HIGH (ref 70–99)
Glucose-Capillary: 128 mg/dL — ABNORMAL HIGH (ref 70–99)

## 2018-07-30 LAB — PHOSPHORUS: Phosphorus: 3.9 mg/dL (ref 2.5–4.6)

## 2018-07-30 LAB — FOLATE: Folate: 16.3 ng/mL (ref >5.9)

## 2018-07-30 LAB — FERRITIN: Ferritin: 618 ng/mL — ABNORMAL HIGH (ref 11–307)

## 2018-07-30 LAB — VITAMIN B12: Vitamin B-12: 815 pg/mL (ref 180–914)

## 2018-07-30 MED ORDER — SODIUM CHLORIDE 0.9% IV SOLUTION
Freq: Once | INTRAVENOUS | Status: AC
  Administered 2018-07-30: 18:00:00 via INTRAVENOUS

## 2018-07-30 MED ORDER — TRAVASOL 10 % IV SOLN
INTRAVENOUS | Status: AC
  Administered 2018-07-30: 17:00:00 via INTRAVENOUS
  Filled 2018-07-30: qty 1195.2

## 2018-07-30 MED ORDER — MAGNESIUM SULFATE 2 GM/50ML IV SOLN
2.0000 g | Freq: Once | INTRAVENOUS | Status: AC
  Administered 2018-07-30: 2 g via INTRAVENOUS
  Filled 2018-07-30: qty 50

## 2018-07-30 NOTE — Progress Notes (Signed)
Unable to acquire supplies to change ostomy appliance/ supplies may be in tonight or tomorrow to change and will change asap. Monitored and no leaking noted, stool formed and will monitor site.

## 2018-07-30 NOTE — Progress Notes (Signed)
   I reviewed patient's CT scan discussed with her.  Plan will be for aortogram with mesenteric angiography and lower extremity evaluation.  Tentatively planning for Monday.  If patient is to be discharged prior to this this could be performed as outpatient.  She will need to be n.p.o. past Sunday night.  Adonica Fukushima C. Randie Heinz, MD [facility] Office: [phone] Pager: [phone]

## 2018-07-30 NOTE — Progress Notes (Signed)
Pharmacy Antibiotic Note  [name] is a 73 y.o. female admitted on 07/07/2018 with colonic perforation.  Pharmacy has been consulted for meropenem dosing.  Planning 3 weeks of meropenem.  Today is # 16.  Plan: Continue Meropenem 1g q 8 hrs.  Height: 5\' 7"  (170.2 cm) Weight: 136 lb 14.5 oz (62.1 kg) IBW/kg (Calculated) : 61.6  Temp (24hrs), Avg:98 F (36.7 C), Min:97.9 F (36.6 C), Max:98.1 F (36.7 C)  Recent Labs  Lab 07/27/18 0458 07/28/18 0307 07/28/18 0725 07/28/18 1820 07/29/18 0701 07/29/18 1016 07/30/18 0313  WBC 15.9* 17.6*  --  15.4* 15.1*  --  13.5*  CREATININE 0.42*  --  0.41* 0.37*  --  0.36* 0.39*    Estimated Creatinine Clearance: 62.7 mL/min (A) (by C-G formula based on SCr of 0.39 mg/dL (L)).    No Known Allergies  Antimicrobials this admission: Cefepime 4/7 >> 4/13 Vanco 4/7 >> 4/8 Meropenem 4/15>> Fluconazole 4/28 >>  Dose adjustments this admission:  Microbiology results: 4/7 BCx x 2: neg 4/8 mrsa pcr negative   Thank you for allowing pharmacy to be a part of this patient's care.  Jenetta Downer, [facility] Clinical Pharmacist Phone [phone]  07/30/2018 9:07 AM

## 2018-07-30 NOTE — Progress Notes (Signed)
Patient ID: [name], female   DOB: 07/11/1945, 73 y.o.   MRN: [id number]    14 Days Post-Op  Subjective: Patient pleasant and awake.  She is not oriented to place, time, or situation.  She is oriented to herself.  She says that she may have eaten some applesauce and a graham cracker yesterday.  This can not be verified.  Objective: Vital signs in last 24 hours: Temp:  [97.9 F (36.6 C)-98.1 F (36.7 C)] 98.1 F (36.7 C) (04/30 0530) Pulse Rate:  [69-73] 73 (04/30 0530) Resp:  [13-23] 23 (04/30 0530) BP: (129-152)/(71) 152/71 (04/30 0530) SpO2:  [97 %-98 %] 98 % (04/30 0530) Weight:  [62.1 kg] 62.1 kg (04/30 0530) Last BM Date: 07/29/18  Intake/Output from previous day: 04/29 0701 - 04/30 0700 In: 20 [I.V.:10] Out: 3000 [Urine:3000] Intake/Output this shift: No intake/output data recorded.  PE: Gen: Pleasant, disoriented, but able to carry on an appropriate conversation and answer questions. Abd: soft, appropriately tender, colostomy is leaking and has some paste like stool present.  Midline wound stable with dehiscence noted.  Superior aspect of wound still soupy and with significant fibrinous exudate.  Both JP drains with minimal output  Lab Results:  Recent Labs    07/29/18 0701 07/30/18 0313  WBC 15.1* 13.5*  HGB 7.1* 7.1*  HCT 23.3* 23.4*  PLT 387 403*   BMET Recent Labs    07/29/18 1016 07/30/18 0313  NA 135 134*  K 4.2 4.3  CL 105 106  CO2 21* 22  GLUCOSE 139* 124*  BUN 15 15  CREATININE 0.36* 0.39*  CALCIUM 8.3* 8.3*   PT/INR Recent Labs    07/28/18 1820  LABPROT 14.5  INR 1.1   CMP     Component Value Date/Time   NA 134 (L) 07/30/2018 0313   NA 142 05/22/2018 1039   K 4.3 07/30/2018 0313   CL 106 07/30/2018 0313   CO2 22 07/30/2018 0313   GLUCOSE 124 (H) 07/30/2018 0313   BUN 15 07/30/2018 0313   BUN 10 05/22/2018 1039   CREATININE 0.39 (L) 07/30/2018 0313   CREATININE 0.72 08/07/2012 1016   CALCIUM 8.3 (L) 07/30/2018 0313   PROT 6.2 (L) 07/30/2018 0313   PROT 6.8 05/22/2018 1039   ALBUMIN 1.4 (L) 07/30/2018 0313   ALBUMIN 3.6 (L) 05/22/2018 1039   AST 85 (H) 07/30/2018 0313   ALT 79 (H) 07/30/2018 0313   ALKPHOS 252 (H) 07/30/2018 0313   BILITOT 0.3 07/30/2018 0313   BILITOT 0.3 05/22/2018 1039   GFRNONAA >60 07/30/2018 0313   GFRNONAA 88 08/07/2012 1016   GFRAA >60 07/30/2018 0313   GFRAA >89 08/07/2012 1016   Lipase     Component Value Date/Time   LIPASE 65 (H) 07/08/2018 1325       Studies/Results: Ct Head Wo Contrast  Result Date: 07/28/2018 CLINICAL DATA:  Unexplained altered level of consciousness. EXAM: CT HEAD WITHOUT CONTRAST TECHNIQUE: Contiguous axial images were obtained from the base of the skull through the vertex without intravenous contrast. COMPARISON:  07/07/2018.  05/30/2018. FINDINGS: Brain: No change since the previous study. Brainstem and cerebellum appear unremarkable. Cerebral hemispheres show a few old small vessel infarctions within the basal ganglia and deep white matter. No cortical or large vessel territory infarction. No mass lesion, hemorrhage hydrocephalus or extra-axial collection. Vascular: There is atherosclerotic calcification of the major vessels at the base of the brain. Skull: Negative Sinuses/Orbits: Posterior nasal polyp again seen on the left, measuring about  2.5 cm in maximal size. Chronic inflammatory changes of the left division of the sphenoid sinus. Other sinuses negative. Orbits negative. Benign venous calcification in the left orbit not significant. Other: None IMPRESSION: No change or acute finding. 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 Chronic left sphenoid sinusitis. Chronic left posterior nasal polyp or polypoid mass. Electronically Signed   By: Paulina Fusi M.D.   On: 07/28/2018 17:41    Anti-infectives: Anti-infectives (From admission, onward)   Start     Dose/Rate Route Frequency Ordered Stop    07/28/18 1000  fluconazole (DIFLUCAN) tablet 100 mg     100 mg Oral Daily 07/27/18 1219 08/04/18 0959   07/27/18 1400  fluconazole (DIFLUCAN) IVPB 200 mg     200 mg 100 mL/hr over 60 Minutes Intravenous  Once 07/27/18 1219 07/27/18 1426   07/15/18 1700  meropenem (MERREM) 1 g in sodium chloride 0.9 % 100 mL IVPB     1 g 200 mL/hr over 30 Minutes Intravenous Every 8 hours 07/15/18 1620     07/08/18 2300  vancomycin (VANCOCIN) 1,500 mg in sodium chloride 0.9 % 500 mL IVPB  Status:  Discontinued     1,500 mg 250 mL/hr over 120 Minutes Intravenous  Once 07/07/18 2128 07/07/18 2129   07/08/18 2300  vancomycin (VANCOCIN) 1,500 mg in sodium chloride 0.9 % 500 mL IVPB  Status:  Discontinued     1,500 mg 250 mL/hr over 120 Minutes Intravenous Every 24 hours 07/07/18 2129 07/08/18 0726   07/08/18 0600  ceFEPIme (MAXIPIME) 2 g in sodium chloride 0.9 % 100 mL IVPB  Status:  Discontinued     2 g 200 mL/hr over 30 Minutes Intravenous Every 8 hours 07/07/18 2124 07/13/18 1333   07/07/18 2130  vancomycin (VANCOCIN) 1,500 mg in sodium chloride 0.9 % 500 mL IVPB     1,500 mg 250 mL/hr over 120 Minutes Intravenous  Once 07/07/18 2123 07/08/18 0028   07/07/18 2115  vancomycin (VANCOCIN) IVPB 1000 mg/200 mL premix  Status:  Discontinued     1,000 mg 200 mL/hr over 60 Minutes Intravenous  Once 07/07/18 2110 07/07/18 2128   07/07/18 2115  ceFEPIme (MAXIPIME) 2 g in sodium chloride 0.9 % 100 mL IVPB     2 g 200 mL/hr over 30 Minutes Intravenous  Once 07/07/18 2110 07/07/18 2203       Assessment/Plan A. 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 Derrell Lolling, 4/16POD #14 -TID WTD dressing changesto help with necrotic tissue at base of wound -cont on soft dietand ensure although patient not eating anything.  Can try magic cup -colostomy with air present and now some paste like stool -add colace BIDand miralax -mobilize and pulm toilet  -cont TNA due to [facility] and poor PO intake. LFTs are actually  Improving, but will plan to place a Cortrak tomorrow when the team is here and start TFs via this route so we can wean TNA to off.  Hopefully this will be short-term and she will get her appetite back.  If not and  she continues to struggle with this then we will have to have further conversations and possible palliative care involvement for GOC meeting.  FEN-soft diet/ensure,IVF,TPN, colace/miralax VTE -SCDs, heparindrip ID -Meropenem 4/15 >> Foley - Removed POD 1 Follow up -Dr. Derrell Lollingamirez POC: Dellis Filbertammy Kallam [phone] daughter/[name] [phone].   LOS: 23 days    Letha CapeKelly E Kaysha Parsell , [facility] 07/30/2018, 9:59 AM Pager: [phone]

## 2018-07-30 NOTE — Progress Notes (Signed)
ANTICOAGULATION CONSULT NOTE - Follow Up Consult  Pharmacy Consult for Heparin Indication: Afib + colon perforation  No Known Allergies  Patient Measurements: Height: 5\' 7"  (170.2 cm) Weight: 136 lb 14.5 oz (62.1 kg) IBW/kg (Calculated) : 61.6 Heparin Dosing Weight: 64.2 kg  Vital Signs: Temp: 98.1 F (36.7 C) (04/30 0530) Temp Source: Oral (04/30 0530) BP: 152/71 (04/30 0530) Pulse Rate: 73 (04/30 0530)  Labs: Recent Labs    07/28/18 1820 07/29/18 0701 07/29/18 1016 07/29/18 1631 07/30/18 0313  HGB 7.1* 7.1*  --   --  7.1*  HCT 23.7* 23.3*  --   --  23.4*  PLT 395 387  --   --  403*  APTT 52*  --   --   --   --   LABPROT 14.5  --   --   --   --   INR 1.1  --   --   --   --   HEPARINUNFRC  --  0.13*  --  0.33 0.26*  CREATININE 0.37*  --  0.36*  --  0.39*    Estimated Creatinine Clearance: 62.7 mL/min (A) (by C-G formula based on SCr of 0.39 mg/dL (L)).   Assessment: 63 yoF with PAF on Eliquis at home, currently on hold for heparin infusion with abdominal procedures. Heparin held briefly 4/28 due to AMS and concern for ICH, however head CT negative and heparin resumed overnight.   Heparin level is now slightly below goal on 1400 units/hr.    RN note mentions mild tongue sores/bleeding.  Hgb low but stable.   Goal of Therapy:  Heparin level 0.3-0.7 units/ml Monitor platelets by anticoagulation protocol: Yes   Plan:  -Increase IV heparin to 1450 units/hr -Recheck heparin level and CBC. -F/u plans to resume Eliquis eventually.  Jenetta Downer, [facility] Clinical Pharmacist Phone [phone]  07/30/2018 8:57 AM

## 2018-07-30 NOTE — Progress Notes (Signed)
Physical Therapy Treatment Patient Details Name: ZYKIA SUTKOWSKI MRN: [id number] DOB: 14-Nov-1945 Today's Date: 07/30/2018    History of Present Illness  MERRIANN FOGEL is a 73 y.o. female with medical history significant of chronic back pain, GERD, hyperlipidemia, hypertension, left knee osteoarthritis, palpitations, PVD h/o Aortofem bypass, s/p RLE thromboembolectomy and R common emoral artery patch angioplasty 05/31/18, history of tobacco use who presents to ER due to fall at home.  patient now s/p EXPLORATORY LAPAROTOMY, lysis of adhesions X 45 minutes LEFT SIGMOID COLECTOMY, SPLENIC FLEXOR MOBILIZATION,DESCENDING COLOSTOMY on 4/16.    PT Comments    Pt admitted with above diagnosis. Pt currently with functional limitations due to balance and endurance deficits. Pt was able to ambulate with RW to door and back.  Tried to encourage pt to walk further but pt refused.  Had to encourage sitting in chair as well but pt finally agreed.   Pt will benefit from skilled PT to increase their independence and safety with mobility to allow discharge to the venue listed below.     Follow Up Recommendations  SNF([facility] if progresses well )     Equipment Recommendations  Other (comment)(TBD )    Recommendations for Other Services       Precautions / Restrictions Precautions Precautions: Fall Restrictions Weight Bearing Restrictions: No    Mobility  Bed Mobility Overal bed mobility: Needs Assistance Bed Mobility: Supine to Sit     Supine to sit: Min guard;Min assist     General bed mobility comments: Pt was able to come to Sitting with min assist.wanted to lie back down but with max encouragement was able to get pt to get up.   Transfers Overall transfer level: Needs assistance Equipment used: Rolling walker (2 wheeled) Transfers: Sit to/from Stand Sit to Stand: Min guard;Min assist         General transfer comment: no assist needed for standing, but steadying assist once  up.  Ambulation/Gait Ambulation/Gait assistance: Min guard Gait Distance (Feet): 40 Feet Assistive device: Rolling walker (2 wheeled) Gait Pattern/deviations: Step-through pattern;Decreased stride length;Drifts right/left;Trunk flexed Gait velocity: slow Gait velocity interpretation: <1.31 ft/sec, indicative of household ambulator General Gait Details: short guarded steps but did Refuse  to walk further than just out door and back to recliner.  Needs max encouragment.    Stairs             Wheelchair Mobility    Modified Rankin (Stroke Patients Only)       Balance Overall balance assessment: Needs assistance Sitting-balance support: Feet supported;No upper extremity supported Sitting balance-Leahy Scale: Fair     Standing balance support: During functional activity;Bilateral upper extremity supported Standing balance-Leahy Scale: Poor Standing balance comment: mildly unsteady, needed assist to be steady in standing.                            Cognition Arousal/Alertness: Awake/alert Behavior During Therapy: WFL for tasks assessed/performed Overall Cognitive Status: Impaired/Different from baseline                                        Exercises General Exercises - Lower Extremity Ankle Circles/Pumps: AROM;10 reps;Supine;Both Quad Sets: AROM;Both;5 reps;Supine Gluteal Sets: 20 reps Long Arc Quad: AROM;Both;Seated;5 reps    General Comments General comments (skin integrity, edema, etc.): VSS      Pertinent Vitals/Pain Pain Assessment: Faces  Faces Pain Scale: Hurts even more Pain Location: stomach Pain Descriptors / Indicators: Burning;Discomfort Pain Intervention(s): Limited activity within patient's tolerance;Monitored during session;Repositioned    Home Living                      Prior Function            PT Goals (current goals can now be found in the care plan section) Acute Rehab PT Goals Patient Stated  Goal: return home with family to assist Progress towards PT goals: Progressing toward goals    Frequency    Min 2X/week      PT Plan Current plan remains appropriate;Frequency needs to be updated    Co-evaluation              AM-PAC PT "6 Clicks" Mobility   Outcome Measure  Help needed turning from your back to your side while in a flat bed without using bedrails?: A Little Help needed moving from lying on your back to sitting on the side of a flat bed without using bedrails?: A Little Help needed moving to and from a bed to a chair (including a wheelchair)?: A Little Help needed standing up from a chair using your arms (e.g., wheelchair or bedside chair)?: A Little Help needed to walk in hospital room?: A Little Help needed climbing 3-5 steps with a railing? : A Lot 6 Click Score: 17    End of Session Equipment Utilized During Treatment: Gait belt Activity Tolerance: Patient limited by fatigue;Patient limited by pain Patient left: in chair;with call bell/phone within reach;with chair alarm set(refused to get OOB) Nurse Communication: Mobility status PT Visit Diagnosis: Unsteadiness on feet (R26.81);Other abnormalities of gait and mobility (R26.89);Difficulty in walking, not elsewhere classified (R26.2)     Time: [id number] PT Time Calculation (min) (ACUTE ONLY): 20 min  Charges:  $Gait Training: 8-22 mins                     Raelie Lohr,PT Acute Rehabilitation Services Pager:  [phone]  Office:  [phone]     Berline LopesDawn F Jeyren Danowski 07/30/2018, 1:37 PM

## 2018-07-30 NOTE — Progress Notes (Signed)
PHARMACY - ADULT TOTAL PARENTERAL NUTRITION CONSULT NOTE   Pharmacy Consult for TPN Indication: Post-op ileus  Patient Measurements: Height: _0  (170.2 cm) Weight: 136 lb 14.5 oz (62.1 kg) IBW/kg (Calculated) : 61.6 TPN AdjBW (KG): 62.3 Body mass index is 21.44 kg/m.  Assessment: 23-yo female with h/o chronic N/V/abdominal pain, A. fib, PVD status post right femoral thrombectomy on 05/31/2018, hypertension and chronic back pain presenting with intractable nausea, vomiting, abdominal pain, new onset of diarrhea and about 30 pounds weight loss in the last several months.   She was admitted to [facility] with a diagnosis of sepsis. 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 Also there is a 3.9 x 1.8 cm and 2.6 x 1.1 cm fluid collection in right pelvis/groin thought to be seroma from prior surgery. IR at [facility] was consulted for stenting her SMA which has about a (30-50% stenosis) to improve mesenteric blood flow.   Patient was transferred to [facility] for further management. S/p upper endoscopy 07/14/18 found Severe ulcerative gastritis most consistent ischemia, biopsied, results pending. CT showed perforation of descending colon. 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 Now with post-op ileus in the setting of 30 pound weight loss and minimal intake since admission.   GI: Open abdomen. Small abscesses on CT. 2 x JP drains with 10 mls output, nothing from colostomy, LBM 4/29, Colace and Miralax; Prealbumin >5 to 5.5. NGT pulled out 4/17- no plan to replace at this time. Patient reports continued  nausea drank Ensure x 2 yesterday (no emesis charted), refusing meals by mouth.  Endo: No history of diabetes; CBGs <180 Insulin requirements in the past 24 hours: 0 unit Lytes: K and Phos wnl; Mg low at 1.6 (goal >2). Na slightly low at 134. Corr Ca elevated at 10.4 (Alb 1.4) Renal: Scr 0.39; BUN 15; UOP 0.7 ml/kg/hr + 2 unmeasured occurrences, net +4.8L Pulm: RA Cards: SBP in 130-150s; HR 70s on metoprolol and heparin drip Hepatobil:AST/ALT trending down at 85/79 and Alk Phos down 252; TBili 0.3; TGs 63 Neuro: Confusion improving. Pain fairly controlled. ID: WBC down 13.5; afebrile; Meropenem 4/15>> (4 weeks per ID)  TPN Access: PICC requested for today (4/18) TPN start date: 4/18 Nutritional Goals (per RD recommendation on 4/24): Kcal:  2000-2200 kcals (32-36 kcal/kg bw) Protein:  112-125g Pro  (1.8-2g/kg bw) Fluid:  2-2.2 L (35m/kcal)  Goal TPN rate is 83 ml/hr (This TPN will provide 120 g of protein, 269 g of dextrose, and 67 g of lipids which provides 2003  kCals per day, meeting 100% of patient needs)  Current Nutrition:  Soft diet --poor appetite, 0% intake Ensure Enlive 237 mL po TID - charted 2 doses on 4/29 TPN at 83 ml/hr  Plan:  Continue TPN at goal rate of 83 ml/hr (Surgery requested weaning this past weekend per progress note and ID requesting wean but patient continues to not meet nutritional goals enterally - Surgery said to go back to full TPN, considering placement of a feeding tube) This TPN will provide 120 g of protein, 269 g of dextrose, and 67 g of lipids which provides  2003  kCals per day, meeting 100% of patient needs Electrolytes in TPN: Standard except increased Na and further increase Mg Add MVI to TPN - trace elements are on back order, will only place in TPN on Monday, Wednesday and Friday Continue sensitive SSI and adjust as needed Add Thiamine and Folate toTPN Monitor TPN labs Mon and Thurs  F/u enteral intake and ability to advance diet as able -  would like for this patient to be able to stop TPN with ongoing infectious process but unable to do so at this time.  Magnesium 2g IV x1 today. Will re-check BMET and Mg in AM  Sloan Leiter, PharmD, BCPS, BCCCP Clinical Pharmacist Please see AMION for all Pharmacists' Contact Phone Numbers 07/30/2018, 7:15 AM

## 2018-07-30 NOTE — Progress Notes (Signed)
PROGRESS NOTE    [name]  RUE:[id number] DOB: 04-05-1945 DOA: 07/07/2018 PCP: [name], Danielle A, [name]    Brief Narrative:  73 year old female with history of chronic N/V/abdominal pain, A. fib, PVD status post right femoral thrombectomy on 05/31/2018, hypertension and chronic back pain presenting with intractable nausea, vomiting, abdominal pain, new onset of diarrhea and about 30 pounds weight loss in the last several months.  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 Her left internal iliac artery was patent at that point.  -CTA abdomen and pelvis on 4/9 showed occlusion of celiac axis, or opacification of SMA and right iliac limb, patent SMV, colonic wall thickenings, rim enhancing 2.5 x 3.3 cm fluid collection around pancreatic tail continuous with a 3.1 x 1.0 cm rim-enhancing fluid collection along the anterior margin of the upper pole of left kidney. Also there is a 3.9 x 1.8 cm and 2.6 x 1.1 cm fluid collection in right pelvis/groin thought to be seroma from prior surgery.  -Patient was [facility].  Patient was also treated with cefepime for presumed pneumonia.  Gastroenterology and vascular surgery consulted.  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  Biopsies taken.  She also had flexible sigmoidoscopy that showed only a 7 mm sessile polyp in the rectum but poor prep. -On 4/15, and continued to have significant leukocytosis, mild temperature and abdominal tenderness,  repeat CT abdomen and pelvis which showed frank perforation of the descending colon appears to be contained within a portion of the omentum, or occluded celiac axis, slightly decreased pancreatic tail lesion but no previously noted fluid collections.  General surgery and IR consulted. -Drain placement by IR, and ex lap, LOA, left sigmoid colectomy  with descending colostomy (Hartmann's) by general surgery, Dr. Derrell Lollingamirez on 4/16.  Abdomen left open out of concern for infectious process and ileus. Started on TPN on 07/18/2018.  Continues to have minimal oral intake.  Assessment & Plan:  Contained perforated descending colon/ chronic mesenteric ischemia -S/p IR drain placement on 4/16 which showed a large left colon defect,  -4/16 S/p Ex Lap, LOA, Left sigmoid colectomy, descending colostomy (Hartmann's), Dr. Derrell Lollingamirez -Postop ileus-resolved -open wound->Continue wet-to-dry dressings, general surgery following -on bowel regimen -Also has TNA ongoing, LFTs had worsened but appears to be improving now --Continue meropenem 4/15--per ID.   Dr. Allena KatzPatel discussed with ID, 4 weeks therapy recommended -Vascular following, plan was for mesenteric angiogram when stable/improved however they feel that patient is too frail to undergo this at this time -Unfortunately oral intake remains very poor, ongoing failure to thrive, discussed difficult situation with surgery and daughter [name], continue supportive care for now, daughter agrees to DNR, family open to palliative care discussions  Paroxysmal atrial fibrillation -Currently in sinus rhythm, on heparin drip -Hold heparin due to recurrent bleed/upper GI   Severe ulcerative gastritis due to ischemia-noted on EGD on 4/14. -Likely from occluded celiac axis -Continue PPI -Vascular surgery following per above  Significant weight loss/moderate protein calorie malnutrition: -Resulting from chronic bowel ischemia -TPN as tolerated -Colon biopsy from the surgery was negative for any malignancy, biopsy from body of stomach were suggestive of intestinal metaplasia but no malignancy.  History of COPD: Stable -Continue DuoNeb as needed   Peripheral vascular disease with dry gangrene: Stable. -Continue home statin -Outpatient vascular surgery follow-up -On therapeutic anticoagulation  with IV heparin per  pharmacy  Essential hypertension: Fairly controlled. -Continue home meds  Chronic pain: -Lower narcotics and Lyrica dose due to oversedation  Tobacco use disorder: -Cessation counseling -Continue Nicotine patch. Stable  Oral thrush. We will treat with Diflucan.  Acute toxic/metabolic encephalopathy -Resolved, due to polypharmacy -Decrease narcotics and Lyrica dose  DVT prophylaxis: hold heparin Code Status: DNR Family Communication: no Family at bedside, called and updated daughter [name] Disposition Plan: Uncertain at this time  Consultants:   Vascular surgery  Gastroenterology  Infectious disease  Interventional radiology  General surgery  Procedures:   EGD on 4/14  Flex sigmoidoscopy on 4/14  IR drain placement on 4/16 which showed a large left colon defect, 4/16  Ex Lap, LOA, Left sigmoid colectomy, descending colostomy (Hartmann's), Dr. Derrell Lolling, 4/16  Antimicrobials: Anti-infectives (From admission, onward)   Start     Dose/Rate Route Frequency Ordered Stop   07/28/18 1000  fluconazole (DIFLUCAN) tablet 100 mg     100 mg Oral Daily 07/27/18 1219 08/04/18 0959   07/27/18 1400  fluconazole (DIFLUCAN) IVPB 200 mg     200 mg 100 mL/hr over 60 Minutes Intravenous  Once 07/27/18 1219 07/27/18 1426   07/15/18 1700  meropenem (MERREM) 1 g in sodium chloride 0.9 % 100 mL IVPB     1 g 200 mL/hr over 30 Minutes Intravenous Every 8 hours 07/15/18 1620     07/08/18 2300  vancomycin (VANCOCIN) 1,500 mg in sodium chloride 0.9 % 500 mL IVPB  Status:  Discontinued     1,500 mg 250 mL/hr over 120 Minutes Intravenous  Once 07/07/18 2128 07/07/18 2129   07/08/18 2300  vancomycin (VANCOCIN) 1,500 mg in sodium chloride 0.9 % 500 mL IVPB  Status:  Discontinued     1,500 mg 250 mL/hr over 120 Minutes Intravenous Every 24 hours 07/07/18 2129 07/08/18 0726   07/08/18 0600  ceFEPIme (MAXIPIME) 2 g in sodium chloride 0.9 % 100 mL IVPB  Status:  Discontinued     2 g 200  mL/hr over 30 Minutes Intravenous Every 8 hours 07/07/18 2124 07/13/18 1333   07/07/18 2130  vancomycin (VANCOCIN) 1,500 mg in sodium chloride 0.9 % 500 mL IVPB     1,500 mg 250 mL/hr over 120 Minutes Intravenous  Once 07/07/18 2123 07/08/18 0028   07/07/18 2115  vancomycin (VANCOCIN) IVPB 1000 mg/200 mL premix  Status:  Discontinued     1,000 mg 200 mL/hr over 60 Minutes Intravenous  Once 07/07/18 2110 07/07/18 2128   07/07/18 2115  ceFEPIme (MAXIPIME) 2 g in sodium chloride 0.9 % 100 mL IVPB     2 g 200 mL/hr over 30 Minutes Intravenous  Once 07/07/18 2110 07/07/18 2203      Subjective: -poor oral intake reports having couple of bites of graham cracker last night -Vomited a large blood clot this afternoon per nurse  Objective: Vitals:   07/29/18 0935 07/29/18 1042 07/29/18 2009 07/30/18 0530  BP:  138/71 129/71 (!) 152/71  Pulse:   69 73  Resp: (!) 23  Temp:   97.9 F (36.6 C) 98.1 F (36.7 C)  TempSrc:   Oral Oral  SpO2:   97% 98%  Weight:    62.1 kg  Height:        Intake/Output Summary (Last 24 hours) at 07/30/2018 1324 Last data filed at 07/30/2018 0604 Gross per 24 hour  Intake 20 ml  Output 2500 ml  Net -2480 ml   [employer]  07/28/18 0500 07/29/18 0500 07/30/18 0530  Weight: 63.6 kg 62.7 kg 62.1 kg    Examination:  Gen: Pale chronically ill-appearing female, sitting up in bed, oriented to self only,  HEENT: PERRLA, Neck supple, no JVD Lungs: Poor air movement bilaterally, otherwise clear  CVS: RRR,No Gallops,Rubs or new Murmurs Abd: Soft, large abdominal binder over dressing, nondistended, bowel sounds present  extremities: No edema Skin: no new rashes Data Reviewed: I have personally reviewed following labs and imaging studies  CBC: Recent Labs  Lab 07/27/18 0458 07/28/18 0307 07/28/18 1820 07/29/18 0701 07/30/18 0313  WBC 15.9* 17.6* 15.4* 15.1* 13.5*  NEUTROABS 10.8*  --  8.7* 8.4* 7.6  HGB 7.7* 7.9* 7.1* 7.1* 7.1*  HCT 25.2*  25.6* 23.7* 23.3* 23.4*  MCV 82.1 81.3 82.3 80.3 80.4  PLT 422* 386 395 387 403*   Basic Metabolic Panel: Recent Labs  Lab 07/24/18 0347  07/27/18 0458 07/28/18 0725 07/28/18 1820 07/29/18 1016 07/30/18 0313  NA 132*   < > 136 135 137 135 134*  K 4.3   < > 4.6 4.5 4.6 4.2 4.3  CL 103   < > 105 106 107 105 106  CO2 23   < > 21* 22  GLUCOSE 116*   < > 121* 120* 107* 139* 124*  BUN 12   < > CREATININE 0.40*   < > 0.42* 0.41* 0.37* 0.36* 0.39*  CALCIUM 7.9*   < > 8.4* 8.3* 8.5* 8.3* 8.3*  MG 1.9  --  1.9  --   --   --  1.6*  PHOS  --   --  3.9  --   --   --  3.9   < > = values in this interval not displayed.   GFR: Estimated Creatinine Clearance: 62.7 mL/min (A) (by C-G formula based on SCr of 0.39 mg/dL (L)). Liver Function Tests: Recent Labs  Lab 07/27/18 0458 07/28/18 0725 07/28/18 1820 07/29/18 1016 07/30/18 0313  AST 254* 248* 170* 104* 85*  ALT 130* 150* 125* 90* 79*  ALKPHOS 328* 290* 262* 269* 252*  BILITOT 0.4 0.5 0.5 0.5 0.3  PROT 6.0* 5.9* 6.3* 6.2* 6.2*  ALBUMIN 1.3* 1.3* 1.4* 1.3* 1.4*   No results for input(s): LIPASE, AMYLASE in the last 168 hours. Recent Labs  Lab 07/28/18 1820  AMMONIA 31   Coagulation Profile: Recent Labs  Lab 07/28/18 1820  INR 1.1   Cardiac Enzymes: No results for input(s): CKTOTAL, CKMB, CKMBINDEX, TROPONINI in the last 168 hours. BNP (last 3 results) No results for input(s): PROBNP in the last 8760 hours. HbA1C: No results for input(s): HGBA1C in the last 72 hours. CBG: Recent Labs  Lab 07/29/18 1615 07/29/18 2011 07/29/18 2358 07/30/18 0516 07/30/18 0824  GLUCAP 124* 132* 128* 123* 114*   Lipid Profile: No results for input(s): CHOL, HDL, LDLCALC, TRIG, CHOLHDL, LDLDIRECT in the last 72 hours. Thyroid Function Tests: Recent Labs    07/28/18 1801 07/28/18 1820  TSH 3.614  --   FREET4  --  0.95   Anemia Panel: Recent Labs    07/30/18 1005 07/30/18 1050  VITAMINB12 815  --    FOLATE  --  16.3  FERRITIN 618*  --   TIBC 204*  --   IRON 11*  --   RETICCTPCT 2.2  --    Sepsis Labs: No results for input(s): PROCALCITON, LATICACIDVEN in the last 168 hours.  No results found for this  or any previous visit (from the past 240 hour(s)).   Radiology Studies: Ct Head Wo Contrast  Result Date: 07/28/2018 CLINICAL DATA:  Unexplained altered level of consciousness. EXAM: CT HEAD WITHOUT CONTRAST TECHNIQUE: Contiguous axial images were obtained from the base of the skull through the vertex without intravenous contrast. COMPARISON:  07/07/2018.  05/30/2018. FINDINGS: Brain: No change since the previous study. Brainstem and cerebellum appear unremarkable. Cerebral hemispheres show a few old small vessel infarctions within the basal ganglia and deep white matter. No cortical or large vessel territory infarction. No mass lesion, hemorrhage hydrocephalus or extra-axial collection. Vascular: There is atherosclerotic calcification of the major vessels at the base of the brain. Skull: Negative Sinuses/Orbits: Posterior nasal polyp again seen on the left, measuring about 2.5 cm in maximal size. Chronic inflammatory changes of the left division of the sphenoid sinus. Other sinuses negative. Orbits negative. Benign venous calcification in the left orbit not significant. Other: None IMPRESSION: No change or acute finding. 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 Chronic left sphenoid sinusitis. Chronic left posterior nasal polyp or polypoid mass. Electronically Signed   By: Paulina Fusi M.D.   On: 07/28/2018 17:41    Scheduled Meds: . sodium chloride   Intravenous Once  . Chlorhexidine Gluconate Cloth  6 each Topical Q0600  . docusate sodium  100 mg Oral BID  . feeding supplement (ENSURE ENLIVE)  237 mL Oral TID BM  . fluconazole  100 mg Oral Daily  . insulin aspart  0-9 Units Subcutaneous Q4H  . lidocaine  1 patch Transdermal Q24H  .  magic mouthwash  10 mL Oral QID  . metoprolol tartrate  75 mg Oral BID  . nicotine  21 mg Transdermal Daily  . pantoprazole (PROTONIX) IV  40 mg Intravenous Q24H  . polyethylene glycol  17 g Oral Daily  . pregabalin  50 mg Oral BID  . saccharomyces boulardii  250 mg Oral BID  . sodium chloride flush  10-40 mL Intracatheter Q12H   Continuous Infusions: . sodium chloride 10 mL/hr at 07/22/18 2300  . heparin 1,450 Units/hr (07/30/18 0851)  . meropenem (MERREM) IV 1 g (07/30/18 0615)  . methocarbamol (ROBAXIN) IV    . TPN ADULT (ION) 83 mL/hr at 07/29/18 1710  . TPN ADULT (ION)       LOS: 23 days   Zannie Cove, MD [facility]  07/30/2018, 1:24 PM

## 2018-07-30 NOTE — Progress Notes (Signed)
Nutrition Follow-up  DOCUMENTATION CODES:   Severe malnutrition in context of acute illness/injury  INTERVENTION:   Once Cortrak placed:  -Osmolite 1.5 @ 20 ml/hr  -Increase by 10 ml Q8 hours to goal rate of 55 ml/hr (1320 ml) -30 ml Prostat BID At goal TF provides: 2180 kcal, 113 grams protein, and 1006 ml free water. Meets 100% of needs.    Continue TPN @ 83 ml/hr- management per pharmacy   Ensure Enlive po BID, each supplement provides 350 kcal and 20 grams of protein  Magic cup TID with meals, each supplement provides 290 kcal and 9 grams of protein  NUTRITION DIAGNOSIS:   Severe Malnutrition related to acute illness(probable ischemic colitis) as evidenced by (21% wt loss in 4 months and an estimated energy intake < or equal to 50% for > or equal to 5 days.).  Ongoing  GOAL:   Patient will meet greater than or equal to 90% of their needs  Met via TPN  MONITOR:   PO intake, Supplement acceptance, Labs, Skin, I & O's, Weight trends  REASON FOR ASSESSMENT:   Consult New TPN/TNA  ASSESSMENT:   73 y/o female PMHx GERD, HLD, HTN, PVD s/p aortofem bypass, s/p RLE thromboembolectomy and R common emoral artery patch angioplasty 05/31/18, and tobacco abuse. Presents this admission with sepsis secondary to PNA and probable ischemic colitis.    4/14- flex sig  4/15- repeat CT revealed self contained perforation.NPO 4/16- exlap, LOA, L sigmoid colectomy w/ colostomy and 2x JPdrain placement, midline wound left open 4/17- NGT pulled out by pt 4/19 TPN start 4/24- full liquids 4/26- soft diet  RD working remotely.  Spoke with RN via phone. Pt refused Ensure and Magic Cup this am. Meal completions charted as 0% for pt's last eight meals. Plan for Cortrak tomorrow and initiation of trickle feeds. Plan to titrate slowly.   Per CCM, if appetite does not progress with TF plan for further goals of care discussions.   Pt tolerating TPN @ 83 ml/hr- provides 2003 kcal and 120  g protein. Wean as TF progresses to goal.   Weight noted to decrease from 63.6 kg on 4/28 to 62.1 kg today. Will continue to monitor trends.   I/O: +741 ml since 4/16  UOP: 3000 ml x 24 hrs   Medications: colace, SS novolog, miralax  Labs: Na 134 (L) Mg 1.6 (L) CBG 114-132   Diet Order:   Diet Order            DIET SOFT Room service appropriate? No; Fluid consistency: Thin  Diet effective now              EDUCATION NEEDS:   Education needs have been addressed  Skin:  Skin Assessment: Skin Integrity Issues: Skin Integrity Issues:: Incisions, Other (Comment) Incisions: OPEN Surgical incision to R + L abdomen (closed at fascia) and to R groin Other: Venous statis ulcer to L toe  Last BM:  4/29- type 5 colostomy (no ml recorded)  Height:   Ht Readings from Last 1 Encounters:  07/14/18 '5\' 7"'$  (1.702 m)    Weight:   Wt Readings from Last 1 Encounters:  07/30/18 62.1 kg    Ideal Body Weight:  61.4 kg  BMI:  Body mass index is 21.44 kg/m.  Estimated Nutritional Needs:   Kcal:  2000-2200 kcals (32-36 kcal/kg bw)  Protein:  112-125g Pro  (1.8-2g/kg bw)  Fluid:  2-2.2 L (30m/kcal)   CMariana SingleRD, LDN Clinical Nutrition Pager # -  [phone]  

## 2018-07-30 NOTE — Consult Note (Signed)
WOC Nurse ostomy follow up Patient receiving care in [facility] 4E07.  I spoke with her primary care RN, Vernona Rieger, via telephone. WOC consult placed this morning for "need colostomy appliance changed, leaking".  Standard ostomy appliance change is within every RN scope of practice.  I have placed an order for the following: Order and have on hand the following ostomy supplies for BEDSIDE nurses to perform ostomy care: Hart Rochester #2 (skin barrier); Hart Rochester [phone] (for pouch); Hart Rochester [phone] (for barrier ring). Once the supplies are on hand, the primary RN can change the appliance Thank you for the consult.  Discussed plan of care with the bedside nurse.  WOC nurse will not follow at this time.  Please re-consult the WOC team if needed.  Helmut Muster, RN, MSN, CWOCN, CNS-BC, pager [phone]

## 2018-07-30 NOTE — Progress Notes (Signed)
Spoke to [place], patient's daughter, with updates.  [name] had just spoken to Dr. Jomarie Longs as well.  Dr. Jomarie Longs and I had a conversation about overall direction of patient's care and prognosis.  These concerns were relayed to [name] by both of [place].  [name] was very appreciative of her mom's care and appropriately saddened that she can not be here with her mom right now.  Letha Cape 2:15 PM 07/30/2018

## 2018-07-31 ENCOUNTER — Ambulatory Visit: Payer: Medicare Other | Admitting: Family

## 2018-07-31 DIAGNOSIS — K659 Peritonitis, unspecified: Secondary | ICD-10-CM

## 2018-07-31 DIAGNOSIS — Z9989 Dependence on other enabling machines and devices: Secondary | ICD-10-CM

## 2018-07-31 DIAGNOSIS — Z515 Encounter for palliative care: Secondary | ICD-10-CM

## 2018-07-31 DIAGNOSIS — Z7189 Other specified counseling: Secondary | ICD-10-CM

## 2018-07-31 DIAGNOSIS — Z933 Colostomy status: Secondary | ICD-10-CM

## 2018-07-31 DIAGNOSIS — Z978 Presence of other specified devices: Secondary | ICD-10-CM

## 2018-07-31 LAB — GLUCOSE, CAPILLARY
Glucose-Capillary: 102 mg/dL — ABNORMAL HIGH (ref 70–99)
Glucose-Capillary: 103 mg/dL — ABNORMAL HIGH (ref 70–99)
Glucose-Capillary: 107 mg/dL — ABNORMAL HIGH (ref 70–99)
Glucose-Capillary: 110 mg/dL — ABNORMAL HIGH (ref 70–99)
Glucose-Capillary: 114 mg/dL — ABNORMAL HIGH (ref 70–99)
Glucose-Capillary: 117 mg/dL — ABNORMAL HIGH (ref 70–99)

## 2018-07-31 LAB — MAGNESIUM: Magnesium: 1.9 mg/dL (ref 1.7–2.4)

## 2018-07-31 LAB — CBC WITH DIFFERENTIAL/PLATELET
Abs Immature Granulocytes: 0.17 10*3/uL — ABNORMAL HIGH (ref 0.00–0.07)
Basophils Absolute: 0.1 10*3/uL (ref 0.0–0.1)
Basophils Relative: 0 %
Eosinophils Absolute: 0.4 10*3/uL (ref 0.0–0.5)
Eosinophils Relative: 3 %
HCT: 25.5 % — ABNORMAL LOW (ref 36.0–46.0)
Hemoglobin: 8 g/dL — ABNORMAL LOW (ref 12.0–15.0)
Immature Granulocytes: 2 %
Lymphocytes Relative: 22 %
Lymphs Abs: 2.6 10*3/uL (ref 0.7–4.0)
MCH: 25.2 pg — ABNORMAL LOW (ref 26.0–34.0)
MCHC: 31.4 g/dL (ref 30.0–36.0)
MCV: 80.4 fL (ref 80.0–100.0)
Monocytes Absolute: 2 10*3/uL — ABNORMAL HIGH (ref 0.1–1.0)
Monocytes Relative: 17 %
Neutro Abs: 6.4 10*3/uL (ref 1.7–7.7)
Neutrophils Relative %: 56 %
Platelets: 389 10*3/uL (ref 150–400)
RBC: 3.17 MIL/uL — ABNORMAL LOW (ref 3.87–5.11)
RDW: 17.4 % — ABNORMAL HIGH (ref 11.5–15.5)
WBC: 11.5 10*3/uL — ABNORMAL HIGH (ref 4.0–10.5)
nRBC: 0.7 % — ABNORMAL HIGH (ref 0.0–0.2)

## 2018-07-31 LAB — BASIC METABOLIC PANEL
Anion gap: 10 (ref 5–15)
BUN: 15 mg/dL (ref 8–23)
CO2: 20 mmol/L — ABNORMAL LOW (ref 22–32)
Calcium: 8.5 mg/dL — ABNORMAL LOW (ref 8.9–10.3)
Chloride: 105 mmol/L (ref 98–111)
Creatinine, Ser: 0.47 mg/dL (ref 0.44–1.00)
GFR calc Af Amer: >60 mL/min (ref >60)
GFR calc non Af Amer: >60 mL/min (ref >60)
Glucose, Bld: 119 mg/dL — ABNORMAL HIGH (ref 70–99)
Potassium: 4.2 mmol/L (ref 3.5–5.1)
Sodium: 135 mmol/L (ref 135–145)

## 2018-07-31 LAB — TYPE AND SCREEN
ABO/RH(D): A NEG
Antibody Screen: POSITIVE
DAT, IgG: POSITIVE
Unit division: 0

## 2018-07-31 LAB — BPAM RBC
Blood Product Expiration Date: 202005042359
ISSUE DATE / TIME: 202004301715
Unit Type and Rh: 600

## 2018-07-31 MED ORDER — TRAVASOL 10 % IV SOLN
INTRAVENOUS | Status: AC
  Administered 2018-07-31: 18:00:00 via INTRAVENOUS
  Filled 2018-07-31: qty 1195.2

## 2018-07-31 NOTE — Consult Note (Signed)
Consultation Note Date: 07/31/2018   Patient Name: [name]  DOB: [birth year]  MRN: [id number]  Age / Sex: 73 y.o., female  PCP: Junie Spencer, [name] Referring Physician: Zannie Cove, MD  Reason for Consultation: Establishing goals of care  HPI/Patient Profile: 73 y.o. female    admitted on 07/07/2018    73 year old female with history of chronic N/V/abdominal pain, A. fib, PVD status post right femoral thrombectomy on 05/31/2018, hypertension and chronic back pain presenting with intractable nausea, vomiting, abdominal pain, new onset of diarrhea and about 30 pounds weight loss in the last several months.  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 Her left internal iliac artery was patent at that point.  -CTA abdomen and pelvis on 4/9 showed occlusion of celiac axis, or opacification of SMA and right iliac limb, patent SMV, colonic wall thickenings, rim enhancing 2.5 x 3.3 cm fluid collection around pancreatic tail continuous with a 3.1 x 1.0 cm rim-enhancing fluid collection along the anterior margin of the upper pole of left kidney. Also  a 3.9 x 1.8 cm and 2.6 x 1.1 cm fluid collection in right pelvis/groin thought to be seroma from prior surgery.  -Patient was transferred to [facility] for further management. Patient was also treated with cefepime for presumed pneumonia. Gastroenterology and vascular surgery consulted. 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 Biopsies taken. She also had flexible sigmoidoscopy that showed only a 7 mm sessile polyp in the rectum but poor prep. -On 4/15, and continued to have significant leukocytosis, mild temperature and abdominal tenderness,  repeat CT abdomen and pelvis which showed frank perforation of the descending colon  appears to be contained within a portion of the omentum, or occluded celiac axis, slightly decreased pancreatic tail lesion but no previously noted fluid collections. General surgery and IR consulted. -Drain placement by IR, and ex lap, LOA, left sigmoid colectomy with descending colostomy (Hartmann's) by general surgery, Dr. Derrell Lolling on 4/16. Abdomen left open out of concern for infectious process and ileus. Started on TPN on 07/18/2018.  Continues to have minimal oral intake.  Clinical Assessment and Goals of Care: A palliative consult has been requested for ongoing goals of care discussions.   The patient is confused, resting in bed, follows some commands. She is not in distress.   Call placed, unable to reach daughter [name], PMT to re attempt over the weekend.    NEXT OF KIN  2 daughters.   SUMMARY OF RECOMMENDATIONS    Agree with DNR Will need to discuss with both daughters about overall goals of care, next steps, while considering what the patient's goals, wishes and values would be.  Given patient's recent hospital course and underlying co morbidities, it seems prudent to consider a more comfort-focused approach to care, this would entail D/C TPN, PRN opioids and possibly residential hospice level of care. Patient remains at high risk  for having ongoing intestinal ischemia events, she might also have infectious complications from recent surgery/wound, high concern for severe ulcerative gastritis due to ischemia precludes any temporary NG/Dobbhoff or PEG placement.  We will continue above discussions with family.  Thank you for the consult.   Code Status/Advance Care Planning:  DNR    Symptom Management:    as above   Palliative Prophylaxis:  Delirium Protocol   Psycho-social/Spiritual:   Desire for further Chaplaincy support:yes  Additional Recommendations: Caregiving  Support/Resources  Prognosis:   guarded  Discharge Planning: To Be Determined      Primary  Diagnoses: Present on Admission: . Sepsis (HCC) . Essential hypertension . Peripheral vascular disease (HCC) . Hyperlipidemia with target LDL less than 100 . Severe protein-calorie malnutrition (HCC) . TOBACCO ABUSE   I have reviewed the medical record, interviewed the patient and family, and examined the patient. The following aspects are pertinent.  Past Medical History:  Diagnosis Date  . Chronic back pain   . GERD (gastroesophageal reflux disease)   . Hypercholesterolemia   . Hyperlipidemia   . Hypertension   . Osteoarthritis of left knee    End-stage  . Palpitations   . PVD (peripheral vascular disease) (HCC)   . Tobacco user    Social History   Socioeconomic History  . Marital status: Divorced    Spouse name: Not on file  . Number of children: Not on file  . Years of education: Not on file  . Highest education level: Not on file  Occupational History  . Occupation: Charity fundraiser: [employer]  Social Needs  . Financial resource strain: Not on file  . Food insecurity:    Worry: Not on file    Inability: Not on file  . Transportation needs:    Medical: Not on file    Non-medical: Not on file  Tobacco Use  . Smoking status: Current Every Day Smoker    Packs/day: 1.00    Years: 52.00    Pack years: 52.00    Types: Cigarettes  . Smokeless tobacco: Never Used  Substance and Sexual Activity  . Alcohol use: No    Comment: Occasionally  . Drug use: No  . Sexual activity: Not Currently  Lifestyle  . Physical activity:    Days per week: Not on file    Minutes per session: Not on file  . Stress: Not on file  Relationships  . Social connections:    Talks on phone: Not on file    Gets together: Not on file    Attends religious service: Not on file    Active member of club or organization: Not on file    Attends meetings of clubs or organizations: Not on file    Relationship status: Not on file  Other Topics Concern  . Not on file  Social  History Narrative  . Not on file   Family History  Problem Relation Age of Onset  . Heart failure Father        CHF  . Hypertension Father   . Heart disease Father   . Hypertension Paternal Aunt   . Hypertension Paternal Uncle   . Hypertension Paternal Grandmother   . Hypertension Paternal Grandfather   . Coronary artery disease Neg Hx        Premature   Scheduled Meds: . Chlorhexidine Gluconate Cloth  6 each Topical Q0600  . docusate sodium  100 mg Oral BID  . feeding supplement (ENSURE ENLIVE)  237 mL Oral TID BM  . fluconazole  100 mg Oral Daily  . insulin aspart  0-9 Units Subcutaneous Q4H  . lidocaine  1 patch Transdermal Q24H  . magic mouthwash  10 mL Oral QID  . metoprolol tartrate  75 mg Oral BID  . nicotine  21 mg Transdermal Daily  . pantoprazole (PROTONIX) IV  40 mg Intravenous Q24H  . pregabalin  50 mg Oral BID  . saccharomyces boulardii  250 mg Oral BID  . sodium chloride flush  10-40 mL Intracatheter Q12H   Continuous Infusions: . sodium chloride 10 mL/hr at 07/22/18 2300  . meropenem (MERREM) IV 1 g (07/31/18 0509)  . methocarbamol (ROBAXIN) IV    . TPN ADULT (ION) 83 mL/hr at 07/31/18 0200  . TPN ADULT (ION)     PRN Meds:.sodium chloride, alum & mag hydroxide-simeth, hydrALAZINE, ipratropium-albuterol, labetalol, methocarbamol (ROBAXIN) IV, morphine injection, oxyCODONE, phenol, promethazine, sodium chloride flush Medications Prior to Admission:  Prior to Admission medications   Medication Sig Start Date End Date Taking? Authorizing Provider  alum & mag hydroxide-simeth (MAALOX/MYLANTA) 200-200-20 MG/5ML suspension Take 15-30 mLs by mouth every 2 (two) hours as needed for indigestion. 06/08/18  Yes Delano Metz, MD  amLODipine (NORVASC) 10 MG tablet Take 1 tablet (10 mg total) by mouth daily. 06/08/18  Yes Delano Metz, MD  apixaban (ELIQUIS) 5 MG TABS tablet Take 1 tablet (5 mg total) by mouth 2 (two) times daily. 06/08/18  Yes Delano Metz, MD   atorvastatin (LIPITOR) 40 MG tablet Take 1 tablet (40 mg total) by mouth daily. 10/23/17  Yes Martin, Mary-Margaret, [name]  DULoxetine (CYMBALTA) 60 MG capsule Take 1 capsule (60 mg total) by mouth daily. 10/23/17  Yes Martin, Mary-Margaret, [name]  fenofibrate 160 MG tablet TAKE 1 TABLET BY MOUTH ONCE DAILY Patient taking differently: Take 160 mg by mouth daily.  11/25/17  Yes Martin, Mary-Margaret, [name]  lidocaine (LIDODERM) 5 % Place 1 patch onto the skin daily. Remove & Discard patch within 12 hours or as directed by MD 06/08/18  Yes Delano Metz, MD  metoprolol tartrate (LOPRESSOR) 25 MG tablet Take 0.5 tablets (12.5 mg total) by mouth 2 (two) times daily. Patient taking differently: Take 12.5 mg by mouth 3 (three) times daily.  06/08/18  Yes Delano Metz, MD  nicotine (NICODERM CQ - DOSED IN MG/24 HOURS) 21 mg/24hr patch Place 1 patch (21 mg total) onto the skin daily. 06/08/18  Yes Delano Metz, MD  ondansetron (ZOFRAN) 4 MG tablet Take 4 mg by mouth every 4 (four) hours as needed for nausea or vomiting.   Yes [provider]  potassium chloride (K-DUR) 10 MEQ tablet Take 10 mEq by mouth daily.   Yes [provider]  pregabalin (LYRICA) 200 MG capsule Take 1 capsule by mouth 3 (three) times daily. 05/20/18  Yes [provider]  senna-docusate (SENOKOT-S) 8.6-50 MG tablet Take 2 tablets by mouth at bedtime as needed for mild constipation or moderate constipation. Patient taking differently: Take 1 tablet by mouth daily.  06/08/18  Yes Delano Metz, MD  tiZANidine (ZANAFLEX) 4 MG tablet Take 1-2 tablets (4-8 mg total) by mouth every 8 (eight) hours as needed for muscle spasms. 09/02/16  Yes Daphine Deutscher, Mary-Margaret, [name]   No Known Allergies Review of Systems Confused  Physical Exam Weak appearing lady, resting in bed She is confused, able to answer very few yes/no questions appropriately Diminished breath sounds S1 S2 Abd: soft, she has an abdominal binder on, also has  colostomy bag   Patient is on TNA  Vital Signs: BP (!) 147/79 (BP Location: Left Arm)   Pulse 75   Temp 98.7 F (37.1 C) (Oral)   Resp 17   Ht 5\' 7"  (1.702 m)   Wt 62.7 kg   SpO2 97%   BMI 21.65 kg/m  Pain Scale: 0-10 POSS *See Group Information*: S-Acceptable,Sleep, easy to arouse Pain Score: 0-No pain   SpO2: SpO2: 97 % O2 Device:SpO2: 97 % O2 Flow Rate: .O2 Flow Rate (L/min): 2 L/min  IO: Intake/output summary:   Intake/Output Summary (Last 24 hours) at 07/31/2018 1323 Last data filed at 07/31/2018 [id number] Gross per 24 hour  Intake 4559.4 ml  Output 1400 ml  Net 3159.4 ml    LBM: Last BM Date: 07/30/18 Baseline Weight: Weight: 74.8 kg Most recent weight: Weight: 62.7 kg     Palliative Assessment/Data:     Time In:  12 Time Out:  1300 Time Total:  60 min  Greater than 50%  of this time was spent counseling and coordinating care related to the above assessment and plan.  Signed by: Rosalin HawkingZeba Camauri Craton, MD [phone]  Please contact Palliative Medicine Team phone at [phone] for questions and concerns.  For individual provider: See Loretha StaplerAmion

## 2018-07-31 NOTE — Progress Notes (Signed)
Patient ID: [name], female   DOB: 1946-01-27, 73 y.o.   MRN: [id number]    15 Days Post-Op  Subjective: Patient still confused this morning, but pleasantly so.  Doesn't remember me.  Not eating anything.  Objective: Vital signs in last 24 hours: Temp:  [97.9 F (36.6 C)-98.9 F (37.2 C)] 98.6 F (37 C) (05/01 0510) Pulse Rate:  [65-79] 79 (05/01 0510) Resp:  [16-20] 16 (05/01 0510) BP: (121-163)/(51-71) 142/58 (04/30 2042) SpO2:  [97 %-98 %] 97 % (05/01 0510) Weight:  [62.7 kg] 62.7 kg (05/01 0500) Last BM Date: 07/30/18  Intake/Output from previous day: 04/30 0701 - 05/01 0700 In: 4599.4 [P.O.:150; I.V.:3651.4; Blood:398; IV Piggyback:400] Out: 1400 [Urine:1400] Intake/Output this shift: No intake/output data recorded.  PE: Abd: soft, appropriately tender, wound is stable with no new changes.  Upper portion still with fibrinous exudate and dehiscence of the entire wound but greatest dimension at inferior aspect.  Colostomy bag just changed, air and smear of stool present.  Both JP drains with minimal output.  Lab Results:  Recent Labs    07/30/18 2200 07/31/18 0500  WBC 10.9* 11.5*  HGB 8.2* 8.0*  HCT 26.4* 25.5*  PLT 386 389   BMET Recent Labs    07/30/18 0313 07/31/18 0500  NA 134* 135  K 4.3 4.2  CL 106 105  CO2 22 20*  GLUCOSE 124* 119*  BUN 15 15  CREATININE 0.39* 0.47  CALCIUM 8.3* 8.5*   PT/INR Recent Labs    07/28/18 1820  LABPROT 14.5  INR 1.1   CMP     Component Value Date/Time   NA 135 07/31/2018 0500   NA 142 05/22/2018 1039   K 4.2 07/31/2018 0500   CL 105 07/31/2018 0500   CO2 20 (L) 07/31/2018 0500   GLUCOSE 119 (H) 07/31/2018 0500   BUN 15 07/31/2018 0500   BUN 10 05/22/2018 1039   CREATININE 0.47 07/31/2018 0500   CREATININE 0.72 08/07/2012 1016   CALCIUM 8.5 (L) 07/31/2018 0500   PROT 6.2 (L) 07/30/2018 0313   PROT 6.8 05/22/2018 1039   ALBUMIN 1.4 (L) 07/30/2018 0313   ALBUMIN 3.6 (L) 05/22/2018 1039   AST 85  (H) 07/30/2018 0313   ALT 79 (H) 07/30/2018 0313   ALKPHOS 252 (H) 07/30/2018 0313   BILITOT 0.3 07/30/2018 0313   BILITOT 0.3 05/22/2018 1039   GFRNONAA >60 07/31/2018 0500   GFRNONAA 88 08/07/2012 1016   GFRAA >60 07/31/2018 0500   GFRAA >89 08/07/2012 1016   Lipase     Component Value Date/Time   LIPASE 65 (H) 07/08/2018 1325       Studies/Results: No results found.  Anti-infectives: Anti-infectives (From admission, onward)   Start     Dose/Rate Route Frequency Ordered Stop   07/28/18 1000  fluconazole (DIFLUCAN) tablet 100 mg     100 mg Oral Daily 07/27/18 1219 08/04/18 0959   07/27/18 1400  fluconazole (DIFLUCAN) IVPB 200 mg     200 mg 100 mL/hr over 60 Minutes Intravenous  Once 07/27/18 1219 07/27/18 1426   07/15/18 1700  meropenem (MERREM) 1 g in sodium chloride 0.9 % 100 mL IVPB     1 g 200 mL/hr over 30 Minutes Intravenous Every 8 hours 07/15/18 1620     07/08/18 2300  vancomycin (VANCOCIN) 1,500 mg in sodium chloride 0.9 % 500 mL IVPB  Status:  Discontinued     1,500 mg 250 mL/hr over 120 Minutes Intravenous  Once 07/07/18 2128  07/07/18 2129   07/08/18 2300  vancomycin (VANCOCIN) 1,500 mg in sodium chloride 0.9 % 500 mL IVPB  Status:  Discontinued     1,500 mg 250 mL/hr over 120 Minutes Intravenous Every 24 hours 07/07/18 2129 07/08/18 0726   07/08/18 0600  ceFEPIme (MAXIPIME) 2 g in sodium chloride 0.9 % 100 mL IVPB  Status:  Discontinued     2 g 200 mL/hr over 30 Minutes Intravenous Every 8 hours 07/07/18 2124 07/13/18 1333   07/07/18 2130  vancomycin (VANCOCIN) 1,500 mg in sodium chloride 0.9 % 500 mL IVPB     1,500 mg 250 mL/hr over 120 Minutes Intravenous  Once 07/07/18 2123 07/08/18 0028   07/07/18 2115  vancomycin (VANCOCIN) IVPB 1000 mg/200 mL premix  Status:  Discontinued     1,000 mg 200 mL/hr over 60 Minutes Intravenous  Once 07/07/18 2110 07/07/18 2128   07/07/18 2115  ceFEPIme (MAXIPIME) 2 g in sodium chloride 0.9 % 100 mL IVPB     2 g 200  mL/hr over 30 Minutes Intravenous  Once 07/07/18 2110 07/07/18 2203       Assessment/Plan A. 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 Derrell Lollingamirez, 4/16POD #15 -TID WTD dressing changesto help with necrotic tissue at base of wound -cont on soft dietand ensure although patient not eating anything.  -colostomy with air present and now some paste like stool -add colace BID -mobilize and pulm toilet  -?DC JP drains as they aren't really doing anything. -[facility] intake. we were going to try Cortrak placement for TFs but patient vomited a blood clot yesterday.  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  Given new information, we will defer on Cortrak placement to avoid complications given low integrity of stomach mucosa. -given lack of oral intake and no plans to do long-term TNA as nutritional support, will likely require palliative care consult for GOC meeting.  [name], daughter, has expressed that the family does not want patient to suffer and does not want long-term artificial feeding methods.  FEN-soft diet/ensure,IVF,TPN, colace VTE -SCDs, heparindrip ID -Meropenem 4/15 >> Foley - Removed POD 1 Follow up -Dr. Derrell Lollingamirez POC: Danielle Filbertammy Grant [phone] daughter/Danielle Mayford KnifeWilliams [phone].  Spoke with [name] today for an update.   LOS: 24 days     Letha CapeKelly E [name] , [facility] 07/31/2018, 9:12 AM Pager: [phone]

## 2018-07-31 NOTE — Progress Notes (Signed)
[facility] CONSULT NOTE   Pharmacy Consult for TPN Indication: Post-op ileus  Patient Measurements: Height: _0  (170.2 cm) Weight: 138 lb 3.7 oz (62.7 kg) IBW/kg (Calculated) : 61.6 TPN AdjBW (KG): 62.3 Body mass index is 21.65 kg/m.  Assessment: 31-yo female with h/o chronic N/V/abdominal pain, A. fib, PVD status post right femoral thrombectomy on 05/31/2018, hypertension and chronic back pain presenting with intractable nausea, vomiting, abdominal pain, new onset of diarrhea and about 30 pounds weight loss in the last several months.   She was admitted to [facility] with a diagnosis of sepsis. 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 Also there is a 3.9 x 1.8 cm and 2.6 x 1.1 cm fluid collection in right pelvis/groin thought to be seroma from prior surgery. IR at [facility] was consulted for stenting her SMA which has about a (30-50% stenosis) to improve mesenteric blood flow.   Patient was transferred to [facility] for further management. S/p upper endoscopy 07/14/18 found Severe ulcerative gastritis most consistent ischemia, biopsied, results pending. CT showed perforation of descending colon. 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 Now with post-op ileus in the setting of 30 pound weight loss and minimal intake since admission.   GI: Open abdomen. Small abscesses on CT. 2 x JP drains with 10 mls output, nothing from colostomy, LBM 4/30, Colace and Miralax; Prealbumin >5 to 5.5. NGT pulled out 4/17- no plan to replace at this time. Diet changed from soft back  to full liquids. Still with little to no intake. Plan to place Cortrak feeding tube today and start trickle feeds as tolerated with slow titration. Endo: No history of diabetes; CBGs <180 Insulin requirements in the past 24 hours: 0 unit Lytes: K and Phos wnl; Mg 1.9 after 2gm yesterday and increase in TPN (goal >2). Na slightly 135. Corr Ca elevated at 10.6 (Alb 1.4) Renal: Scr 0.39; BUN 15; UOP 0.7 ml/kg/hr + 2 unmeasured occurrences, net +4.8L Pulm: RA Cards: SBP in 130-150s; HR 70s on metoprolol and heparin drip Hepatobil:AST/ALT trending down at 85/79 and Alk Phos down 252; TBili 0.3; TGs 63 Neuro: Confusion improving. Pain fairly controlled. ID: WBC down 13.5; afebrile; Meropenem 4/15>> (4 weeks per ID)  TPN Access: PICC requested for today (4/18) TPN start date: 4/18 Nutritional Goals (per RD recommendation on 4/30): Kcal:  2000-2200 kcals (32-36 kcal/kg bw) Protein:  112-125g Pro  (1.8-2g/kg bw) Fluid:  2-2.2 L (78m/kcal)  Goal TPN rate is 83 ml/hr (This TPN will provide 120 g of protein, 269 g of dextrose, and 67 g of lipids which provides 2003  kCals per day, meeting 100% of patient needs)  Current Nutrition:  Full liquid diet --poor appetite, 0% intake Ensure Enlive 237 mL po TID - charted 2 doses on 4/29 TPN at 83 ml/hr  Plan:  Continue TPN at goal rate of 83 ml/hr (full TPN, plan for placement of a feeding tube today and trickle feeds) This TPN will provide 119.5 g of protein, 268.9 g of dextrose, and 67.8 g of lipids which provides 2070.4  kCals per day, meeting 100%  of patient needs Electrolytes in TPN: remove Ca today, continue increased Na and Mg. Others standard. Add MVI to TPN - trace elements are on back order, will only place in TPN on Monday, Wednesday and Friday Continue sensitive SSI and adjust as needed Add Thiamine and Folate toTPN Monitor TPN labs Mon and Thurs  F/u enteral intake and toleration of feedings per Cortrak - would like for this patient to be  able to stop TPN with ongoing infectious process but unable to do so at this time. BMET and Mg in AM  Sloan Leiter, PharmD, BCPS, BCCCP Clinical Pharmacist Please see AMION for all Pharmacists' Contact Phone Numbers 07/31/2018, 7:09 AM

## 2018-07-31 NOTE — Progress Notes (Signed)
Nutrition Follow-up  DOCUMENTATION CODES:   Severe malnutrition in context of acute illness/injury  INTERVENTION:    Continue TPN @ 83 ml/hr- management per pharmacy   Ensure Enlive po BID, each supplement provides 350 kcal and 20 grams of protein  Magic cup TID with meals, each supplement provides 290 kcal and 9 grams of protein  NUTRITION DIAGNOSIS:   Severe Malnutrition related to acute illness(probable ischemic colitis) as evidenced by (21% wt loss in 4 months and an estimated energy intake < or equal to 50% for > or equal to 5 days.).  Ongoing  GOAL:   Patient will meet greater than or equal to 90% of their needs  Met via TPN  MONITOR:   PO intake, Supplement acceptance, Labs, Skin, I & O's, Weight trends  REASON FOR ASSESSMENT:   Consult New TPN/TNA  ASSESSMENT:   73 y/o female PMHx GERD, HLD, HTN, PVD s/p aortofem bypass, s/p RLE thromboembolectomy and R common emoral artery patch angioplasty 05/31/18, and tobacco abuse. Presents this admission with sepsis secondary to PNA and probable ischemic colitis.    4/14- flex sig  4/15- repeat CT revealed self contained perforation.NPO 4/16- exlap, LOA, L sigmoid colectomy w/ colostomy and 2x JPdrain placement, midline wound left open 4/17- NGT pulled out by pt 4/19 TPN start 4/24- full liquids 4/26- soft diet  Spoke with pt at bedside. Poor appetite continue. States she doesn't want anything to eat. RD observed breakfast tray untouched with 4 magic cups unopened. Meal completions charted as 0% for pt's last eight meals.   Prior plan for Cortrak placement today discontinued due to concern for severe ulcerative gastritis secondary to ischemia. Palliative recommending comfort approach. Plan to discuss more with family.   Pt tolerating TPN @ 83 ml/hr- provides 2003 kcal and 120 g protein.  Weight noted to remain stable around 62 kg. Will continue to monitor.   Medications: colace, SS novolog Labs: CBG  103-114  Diet Order:   Diet Order            DIET SOFT Room service appropriate? No; Fluid consistency: Thin  Diet effective now              EDUCATION NEEDS:   Education needs have been addressed  Skin:  Skin Assessment: Skin Integrity Issues: Skin Integrity Issues:: Incisions, Other (Comment) Incisions: OPEN Surgical incision to R + L abdomen (closed at fascia) and to R groin Other: Venous statis ulcer to L toe  Last BM:  4/29- type 5 colostomy (no ml recorded)  Height:   Ht Readings from Last 1 Encounters:  07/14/18 _0  (1.702 m)    Weight:   Wt Readings from Last 1 Encounters:  07/31/18 62.7 kg    Ideal Body Weight:  61.4 kg  BMI:  Body mass index is 21.65 kg/m.  Estimated Nutritional Needs:   Kcal:  2000-2200 kcals (32-36 kcal/kg bw)  Protein:  112-125g Pro  (1.8-2g/kg bw)  Fluid:  2-2.2 L (78m/kcal)   CMariana SingleRD, LDN Clinical Nutrition Pager # [phone]

## 2018-07-31 NOTE — Progress Notes (Signed)
[facility]   Reason for visit: Follow up on ischemic colitis/bowel perforation  Antibiotic Days:17 Meropenem, day17 TPN, day14   Interval History: colostomy output continuing but decreased today per nursing. Participation with PT finally improving but she remains anorexic, reporting abdominal pain after attempted feeds. No change in drainage from abdominal JP drains thus far. Fever curves, ABX, WBC trends, and imaging all independently reviewed   Physical Exam: Constitutional: chronically ill-appearing, mild distress secondary to abdominal pain, A&Ox 3, withdrawn Vitals:   07/31/18 0955 07/31/18 1319  BP: 139/66 (!) 147/79  Pulse: 70 75  Resp:  17  Temp:  98.7 F (37.1 C)  SpO2:    Eyes: anicteric, PERRL, EOMI HENT: no thrush, +bitemporal wasting, PO clear, MMM Respiratory: Normal respiratory effort; CTA B Cardiovascular:NRRR, no obvious cardiac murmurs evident GI:+colostomy with excessive gas but only small amount of liquid stool today, TTP overlying midline incision, hypoactive BS, 2 LLQ JP drains with serous output Extrems: trace LE edema, 2+ pulses MSK: muscular atrophy noted, normal muscle tone Skin: no rashes, poor skin turgor Neuro: CN II-XII intact, no focal neurologic deficits Pysch: flat affect, less withdrawn today, depressed mood  Review of Systems: +anorexia, abdominal pain near incision and JP drains, no F/C, emesis x 1 today, no SOB/CP. All other systems were reviewed and negative except as noted above.  Lab Results  Component Value Date   WBC 11.5 (H) 07/31/2018   HGB 8.0 (L) 07/31/2018   HCT 25.5 (L) 07/31/2018   MCV 80.4 07/31/2018   PLT 389 07/31/2018    Lab Results  Component Value Date   CREATININE 0.47 07/31/2018   BUN 15 07/31/2018   NA 135 07/31/2018   K 4.2 07/31/2018   CL 105 07/31/2018   CO2 20 (L) 07/31/2018    Lab Results  Component Value Date   ALT 79 (H) 07/30/2018   AST 85 (H) 07/30/2018   ALKPHOS 252 (H) 07/30/2018     Microbiology: No results found for this or any previous visit (from the past 240 hour(s)).   CT abdomen/pelvis with IV contrast only on 07/23/2018 IMPRESSION: 1. Interval partial colectomy with creation of right lower quadrant colostomy. Placement of left upper and lower quadrant drainage catheters. Moderate amount of pneumoperitoneum within the left upper quadrant mesentery and adjacent to the liver, suspect that this may be secondary to residual operative air. There is however a focal defect within the fundal/anterior proximal body of the stomach, adjacent to the left upper quadrant drainage catheter which is contiguous with gas and fluid surrounding a portion of the catheter. This is suspected to represent a focal gastric perforation. Moderate inflammatory changes in the left upper quadrant mesentery, likely due to resolving postsurgical change. 2. Multiple small organizing fluid collections within the left mid and upper abdomen, suspect to represent small intra-abdominal abscesses. The largest is seen in the left gutter and measures 2.3 cm. 3. Development of small left pleural effusion with basilar atelectasis. 4. Other chronic findings as previously reported. Decreased size of presumed pseudocyst in the pancreatic tail.  Critical Value/emergent results were called by telephone at the time of interpretation on 07/23/2018 at 8:06 pm to Dr. Luisa Hart, who verbally acknowledged these results.  Impression/Plan: The patient is a 73 y/o WF with severe PVD, s/p recent aorto-bifem bypass graft in 05/2018, chronic dry gangrene of toes admitted on 07/07/2018 with leukocytosis and fever, ultimately found to have ischemic colitis and bowel perforation, s/p resection and creation of diverting  colostomy.  1. Ischemic colitis/secondary peritonitis -patient had initial elevation her lactic acid and classic symptoms of intestinal ischemia.  She was taken to the  operating room on April 15 for an exploratory laparotomy and bowel resection where she was found to have a bowel perforation at the time and diverting colostomy was also placed.  Since that time, the patient has had only minimal output via her colostomy and that did not begin until POD #12.   Unfortunately, no operative cultures were obtained at the time of her surgery, so empiric meropenem has been given since that time.  She was started on TPN for the past 2 weeks and given bowel rest until this week.  Repeat CT imaging was performed last week; however, oral contrast was not given.  The radiologist initially read her repeat CT scan as a possible gastric perforation with a 2 new small intra-abdominal abscesses.  While these abscesses are small and could potentially be treated medically, I have concern that this has arisen in the setting of broad-spectrum antibiotic coverage with meropenem.  Cultures are direly needed in this patient to guide further therapy and avoid overly broad antibiotics that would lead to harm later.  Furthermore, I am skeptical that the patient does not truly have a perforation or ongoing slow leak as the cause of her CT findings and new abscesses.  She now has a diverting colostomy and an otherwise intact gut, so she should not be developing recurrent abscesses following her bowel resection unless she has further ischemic bowel.  If the patient's continues to have anorexia, at minimum I would repeat a contrasted CT scan of the abdomen with oral and IV contrast to better evaluate for possible perforation.  If this scan were deferred, an operative intervention should be pursued instead if she clinically deteriorates or fails to improve.  We will extend her meropenem for a total of 4 weeks (3 weeks from the time of discovery of her new abscesses) empirically given her new abscesses noted on exam. Tentative ABX d/c date is 08/14/2018.  2. Leukocytosis -this issue had been declining slowly  following the patient's most recent surgery but has not fully resolved.  Repeat CT imaging is suggestive of 2 new intra-abdominal abscesses and a possible perforation.  This is concerning given that this is happening in the setting of broad-spectrum antibiotics and markedly delayed ostomy output for nearly 2 weeks now.  Check a CBC with differential daily to trend the patient's white blood cell count and continue until the patient's white blood cell count has returned to normal.  3. F/E/N - patient is now been on TPN for the past 2 weeks.  The past 1 week of which while she is been attempting reinitiation of enteral diet.  Clinical inertia poses a risk for the patient to develop fungemia while she is on TPN and broad-spectrum antibiotics simultaneously.  Please make efforts to DC the patient's TPN in the next 1 to 2 days as this may actually be impeding efforts to proceed with enteral nutrition and would also consider giving the patient GI cathartics to hasten ostomy output.  If mesenteric ischemia is still a concern, I would check a lactic acid as well.

## 2018-07-31 NOTE — Progress Notes (Signed)
Physical Therapy Treatment Patient Details Name: [name] MRN: [id number] DOB: 12-Jun-1945 Today's Date: 07/31/2018    History of Present Illness  [name] is a 73 y.o. female with medical history significant of chronic back pain, GERD, hyperlipidemia, hypertension, left knee osteoarthritis, palpitations, PVD h/o Aortofem bypass, s/p RLE thromboembolectomy and R common emoral artery patch angioplasty 05/31/18, history of tobacco use who presents to ER due to fall at home.  patient now s/p EXPLORATORY LAPAROTOMY, lysis of adhesions X 45 minutes LEFT SIGMOID COLECTOMY, SPLENIC FLEXOR MOBILIZATION,DESCENDING COLOSTOMY on 4/16.    PT Comments    Pt admitted with above diagnosis. Pt currently with functional limitations due to balance and endurance deficits. Pt continues to be difficult to motivate. She did get on 3N1 and walk to door and back with RW with min guard assist.  Refuses to go out in hallway and can get grumpy with PT.  Pt DOE at end of walk 3/4.  VSS.  Pt will benefit from skilled PT to increase their independence and safety with mobility to allow discharge to the venue listed below.    Follow Up Recommendations  SNF(HHPT if progresses well )     Equipment Recommendations  Other (comment)(TBD )    Recommendations for Other Services       Precautions / Restrictions Precautions Precautions: Fall Restrictions Weight Bearing Restrictions: No    Mobility  Bed Mobility Overal bed mobility: Needs Assistance Bed Mobility: Supine to Sit     Supine to sit: Min guard;Min assist     General bed mobility comments: Pt was able to come to EOB with assist for trunk elevaiton.   Transfers Overall transfer level: Needs assistance Equipment used: Rolling walker (2 wheeled) Transfers: Sit to/from Stand Sit to Stand: Min guard;Min assist Stand pivot transfers: Min assist       General transfer comment: no assist needed for standing, but steadying assist once  up.  Ambulation/Gait Ambulation/Gait assistance: Min guard Gait Distance (Feet): 40 Feet Assistive device: Rolling walker (2 wheeled) Gait Pattern/deviations: Step-through pattern;Decreased stride length;Drifts right/left;Trunk flexed Gait velocity: slow Gait velocity interpretation: <1.31 ft/sec, indicative of household ambulator General Gait Details: short guarded steps but did Refuse  to walk further than just out door and back to recliner.  Needs max encouragment.    Stairs             Wheelchair Mobility    Modified Rankin (Stroke Patients Only)       Balance Overall balance assessment: Needs assistance Sitting-balance support: Feet supported;No upper extremity supported Sitting balance-Leahy Scale: Fair     Standing balance support: During functional activity;Bilateral upper extremity supported Standing balance-Leahy Scale: Poor Standing balance comment: mildly unsteady, needed assist to be steady in standing.                            Cognition Arousal/Alertness: Awake/alert Behavior During Therapy: WFL for tasks assessed/performed Overall Cognitive Status: Impaired/Different from baseline                                        Exercises General Exercises - Lower Extremity Long Arc Quad: AROM;Both;Seated;5 reps Hip Flexion/Marching: AROM;Both;10 reps;Seated    General Comments        Pertinent Vitals/Pain Pain Assessment: Faces Faces Pain Scale: Hurts even more Pain Location: stomach, right LE Pain Descriptors /  Indicators: Burning;Discomfort Pain Intervention(s): Limited activity within patient's tolerance;Monitored during session;Repositioned    Home Living                      Prior Function            PT Goals (current goals can now be found in the care plan section) Acute Rehab PT Goals Patient Stated Goal: return home with family to assist Progress towards PT goals: Progressing toward goals     Frequency    Min 2X/week      PT Plan Current plan remains appropriate;Frequency needs to be updated    Co-evaluation              AM-PAC PT "6 Clicks" Mobility   Outcome Measure  Help needed turning from your back to your side while in a flat bed without using bedrails?: A Little Help needed moving from lying on your back to sitting on the side of a flat bed without using bedrails?: A Little Help needed moving to and from a bed to a chair (including a wheelchair)?: A Little Help needed standing up from a chair using your arms (e.g., wheelchair or bedside chair)?: A Little Help needed to walk in hospital room?: A Little Help needed climbing 3-5 steps with a railing? : A Lot 6 Click Score: 17    End of Session Equipment Utilized During Treatment: Gait belt Activity Tolerance: Patient limited by fatigue;Patient limited by pain Patient left: in chair;with call bell/phone within reach;with chair alarm set Nurse Communication: Mobility status PT Visit Diagnosis: Unsteadiness on feet (R26.81);Other abnormalities of gait and mobility (R26.89);Difficulty in walking, not elsewhere classified (R26.2)     Time: [id number] PT Time Calculation (min) (ACUTE ONLY): 19 min  Charges:  $Gait Training: 8-22 mins                     Rylee Nuzum,PT Acute Rehabilitation Services Pager:  [phone]  Office:  [phone]     Berline LopesDawn F Randell Teare 07/31/2018, 3:52 PM

## 2018-07-31 NOTE — Progress Notes (Signed)
PROGRESS NOTE    Danielle FilbertLinda S Wedekind  ONG:[id number] DOB: 1946/03/13 DOA: 07/07/2018 PCP: [name], [name], [name]    Brief Narrative:  73 year old female with history of chronic N/V/abdominal pain, A. fib, PVD status post right femoral thrombectomy on 05/31/2018, hypertension and chronic back pain presented with intractable nausea, vomiting, abdominal pain, new onset of diarrhea and about 30 pounds weight loss in the last several months.  [facility]  on 07[facility]07/2018, -CTA abdomen and pelvis on 4/9 showed occlusion of celiac axis, or opacification of SMA and right iliac limb, patent SMV, colonic wall thickenings, rim enhancing 2.5 x 3.3 cm fluid collection around pancreatic tail continuous with a 3.1 x 1.0 cm rim-enhancing fluid collection along the anterior margin of the upper pole of left kidney. Also there is a 3.9 x 1.8 cm and 2.6 x 1.1 cm fluid collection in right pelvis/groin thought to be seroma from prior surgery.  -[facility].  Patient was also treated with cefepime for presumed pneumonia.  Gastroenterology and vascular surgery consulted.  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  She also had flexible sigmoidoscopy that showed only a 7 mm sessile polyp in the rectum but poor prep. -On 4/15, and continued to have significant leukocytosis, mild temperature and abdominal tenderness,  repeat CT abdomen and pelvis which showed frank perforation of the descending colon appears to be contained within a portion of the omentum, or occluded celiac axis, slightly decreased pancreatic tail lesion but no previously noted fluid collections.  General surgery and IR consulted. -Drain placement by IR, and ex lap, LOA, left sigmoid colectomy with descending colostomy (Hartmann's) by general surgery, Dr. Derrell Lollingamirez on 4/16.  Abdomen left open out of concern for infectious process and ileus. Started on TPN  on 07/18/2018.  Continues to have minimal oral intake.  Assessment & Plan:  Contained perforated descending colon/ chronic mesenteric ischemia -S/p IR drain placement on 4/16 which showed a large left colon defect,  -4/16 S/p Ex Lap, LOA, Left sigmoid colectomy, descending colostomy (Hartmann's), Dr. Derrell Lollingamirez -Postop ileus-resolved -open wound->Continue wet-to-dry dressings, general surgery following -on bowel regimen -Also has TNA ongoing, LFTs had worsened but appears to be improving now --Continue meropenem 4/15--per ID.   Dr. Allena KatzPatel discussed with ID, 4 weeks therapy recommended -Vascular following, plan was for mesenteric angiogram , this was deferred due to overall poor condition, now being considered for Monday -Unfortunately oral intake remains very poor, ongoing failure to thrive, discussed difficult situation with surgery and daughter [name], continue supportive care for now, daughter agrees to DNR, family open to palliative care discussions -Continue goals of care discussions, if declines further will need to consider comfort focused care  Paroxysmal atrial fibrillation -Currently in sinus rhythm -Held heparin due to recurrent bleed/upper GI   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 -Likely from occluded celiac axis -Continue PPI -Vascular surgery following per above  Significant weight loss/moderate protein calorie malnutrition: -Resulting from chronic bowel ischemia -TPN as tolerated -Colon biopsy from the surgery was negative for any malignancy, biopsy from body of stomach were suggestive of intestinal metaplasia but no malignancy.  History of COPD: Stable -Continue DuoNeb as needed   Peripheral vascular disease with dry gangrene: Stable. -Continue home statin -was on therapeutic anticoagulation with IV heparin per pharmacy, heparin was held following recurrent upper GI bleed  Essential hypertension:  -Continue home meds  Chronic pain:  -  Lower narcotics and Lyrica dose due to oversedation  Tobacco use disorder: -Cessation counseling -Continue Nicotine patch. Stable  Oral thrush. -Treated with with Diflucan.  Acute toxic/metabolic encephalopathy -Resolved, due to polypharmacy -Decreased narcotics and Lyrica dose  DVT prophylaxis: hold heparin Code Status: DNR Family Communication: no Family at bedside, called and updated daughter [name] 4/30 Disposition Plan: Uncertain at this time  Consultants:   Vascular surgery  Gastroenterology  Infectious disease  Interventional radiology  General surgery  Procedures:   EGD on 4/14  Flex sigmoidoscopy on 4/14  IR drain placement on 4/16 which showed a large left colon defect, 4/16  Ex Lap, LOA, Left sigmoid colectomy, descending colostomy (Hartmann's), Dr. Derrell Lolling, 4/16  Antimicrobials: Anti-infectives (From admission, onward)   Start     Dose/Rate Route Frequency Ordered Stop   07/28/18 1000  fluconazole (DIFLUCAN) tablet 100 mg     100 mg Oral Daily 07/27/18 1219 08/04/18 0959   07/27/18 1400  fluconazole (DIFLUCAN) IVPB 200 mg     200 mg 100 mL/hr over 60 Minutes Intravenous  Once 07/27/18 1219 07/27/18 1426   07/15/18 1700  meropenem (MERREM) 1 g in sodium chloride 0.9 % 100 mL IVPB     1 g 200 mL/hr over 30 Minutes Intravenous Every 8 hours 07/15/18 1620     07/08/18 2300  vancomycin (VANCOCIN) 1,500 mg in sodium chloride 0.9 % 500 mL IVPB  Status:  Discontinued     1,500 mg 250 mL/hr over 120 Minutes Intravenous  Once 07/07/18 2128 07/07/18 2129   07/08/18 2300  vancomycin (VANCOCIN) 1,500 mg in sodium chloride 0.9 % 500 mL IVPB  Status:  Discontinued     1,500 mg 250 mL/hr over 120 Minutes Intravenous Every 24 hours 07/07/18 2129 07/08/18 0726   07/08/18 0600  ceFEPIme (MAXIPIME) 2 g in sodium chloride 0.9 % 100 mL IVPB  Status:  Discontinued     2 g 200 mL/hr over 30 Minutes Intravenous Every 8 hours 07/07/18 2124 07/13/18 1333   07/07/18  2130  vancomycin (VANCOCIN) 1,500 mg in sodium chloride 0.9 % 500 mL IVPB     1,500 mg 250 mL/hr over 120 Minutes Intravenous  Once 07/07/18 2123 07/08/18 0028   07/07/18 2115  vancomycin (VANCOCIN) IVPB 1000 mg/200 mL premix  Status:  Discontinued     1,000 mg 200 mL/hr over 60 Minutes Intravenous  Once 07/07/18 2110 07/07/18 2128   07/07/18 2115  ceFEPIme (MAXIPIME) 2 g in sodium chloride 0.9 % 100 mL IVPB     2 g 200 mL/hr over 30 Minutes Intravenous  Once 07/07/18 2110 07/07/18 2203      Subjective: -poor oral intake reports having couple of bites of graham cracker last night -Vomited a large blood clot this afternoon per nurse  Objective: Vitals:   07/31/18 0500 07/31/18 0510 07/31/18 0955 07/31/18 1319  BP:   139/66 (!) 147/79  Pulse:  79 70 75  Resp:  16  17  Temp:  98.6 F (37 C)  98.7 F (37.1 C)  TempSrc:  Oral  Oral  SpO2:  97%    Weight: 62.7 kg     Height:        Intake/Output Summary (Last 24 hours) at 07/31/2018 1353 Last data filed at 07/31/2018 0952 Gross per 24 hour  Intake 4121.66 ml  Output 1000 ml  Net 3121.66 ml   Filed Weights   07/29/18 0500 07/30/18 0530 07/31/18 0500  Weight: 62.7 kg 62.1 kg 62.7 kg  Examination:  Gen: Pale, chronically ill-appearing female, sitting up in bed, oriented to self only HEENT: PERRLA, Neck supple, no JVD Lungs: Poor air movement bilaterally, otherwise clear CVS: S1-S2/regular rate rhythm Abd: Soft, large abdominal binder over dressing, nondistended, bowel sounds present  Extremities: No edema Skin: no new rashes Data Reviewed: I have personally reviewed following labs and imaging studies  CBC: Recent Labs  Lab 07/27/18 0458  07/28/18 1820 07/29/18 0701 07/30/18 0313 07/30/18 2200 07/31/18 0500  WBC 15.9*   < > 15.4* 15.1* 13.5* 10.9* 11.5*  NEUTROABS 10.8*  --  8.7* 8.4* 7.6  --  6.4  HGB 7.7*   < > 7.1* 7.1* 7.1* 8.2* 8.0*  HCT 25.2*   < > 23.7* 23.3* 23.4* 26.4* 25.5*  MCV 82.1   < > 82.3 80.3  80.4 80.7 80.4  PLT 422*   < > 395 387 403* 386 389   < > = values in this interval not displayed.   Basic Metabolic Panel: Recent Labs  Lab 07/27/18 0458 07/28/18 0725 07/28/18 1820 07/29/18 1016 07/30/18 0313 07/31/18 0500  NA 136 135 137 135 134* 135  K 4.6 4.5 4.6 4.2 4.3 4.2  CL 105 106 107 105 106 105  CO2 21* 22 20*  GLUCOSE 121* 120* 107* 139* 124* 119*  BUN CREATININE 0.42* 0.41* 0.37* 0.36* 0.39* 0.47  CALCIUM 8.4* 8.3* 8.5* 8.3* 8.3* 8.5*  MG 1.9  --   --   --  1.6* 1.9  PHOS 3.9  --   --   --  3.9  --    GFR: Estimated Creatinine Clearance: 62.7 mL/min (by C-G formula based on SCr of 0.47 mg/dL). Liver Function Tests: Recent Labs  Lab 07/27/18 0458 07/28/18 0725 07/28/18 1820 07/29/18 1016 07/30/18 0313  AST 254* 248* 170* 104* 85*  ALT 130* 150* 125* 90* 79*  ALKPHOS 328* 290* 262* 269* 252*  BILITOT 0.4 0.5 0.5 0.5 0.3  PROT 6.0* 5.9* 6.3* 6.2* 6.2*  ALBUMIN 1.3* 1.3* 1.4* 1.3* 1.4*   No results for input(s): LIPASE, AMYLASE in the last 168 hours. Recent Labs  Lab 07/28/18 1820  AMMONIA 31   Coagulation Profile: Recent Labs  Lab 07/28/18 1820  INR 1.1   Cardiac Enzymes: No results for input(s): CKTOTAL, CKMB, CKMBINDEX, TROPONINI in the last 168 hours. BNP (last 3 results) No results for input(s): PROBNP in the last 8760 hours. HbA1C: No results for input(s): HGBA1C in the last 72 hours. CBG: Recent Labs  Lab 07/30/18 2010 07/31/18 0003 07/31/18 0507 07/31/18 0753 07/31/18 1157  GLUCAP 111* 103* 110* 114* 107*   Lipid Profile: No results for input(s): CHOL, HDL, LDLCALC, TRIG, CHOLHDL, LDLDIRECT in the last 72 hours. Thyroid Function Tests: Recent Labs    07/28/18 1801 07/28/18 1820  TSH 3.614  --   FREET4  --  0.95   Anemia Panel: Recent Labs    07/30/18 1005 07/30/18 1050  VITAMINB12 815  --   FOLATE  --  16.3  FERRITIN 618*  --   TIBC 204*  --   IRON 11*  --   RETICCTPCT 2.2  --     Sepsis Labs: No results for input(s): PROCALCITON, LATICACIDVEN in the last 168 hours.  No results found for this or any previous visit (from the past 240 hour(s)).   Radiology Studies: No results found.  Scheduled Meds: . Chlorhexidine Gluconate Cloth  6 each Topical Q0600  .  docusate sodium  100 mg Oral BID  . feeding supplement (ENSURE ENLIVE)  237 mL Oral TID BM  . fluconazole  100 mg Oral Daily  . insulin aspart  0-9 Units Subcutaneous Q4H  . lidocaine  1 patch Transdermal Q24H  . magic mouthwash  10 mL Oral QID  . metoprolol tartrate  75 mg Oral BID  . nicotine  21 mg Transdermal Daily  . pantoprazole (PROTONIX) IV  40 mg Intravenous Q24H  . pregabalin  50 mg Oral BID  . saccharomyces boulardii  250 mg Oral BID  . sodium chloride flush  10-40 mL Intracatheter Q12H   Continuous Infusions: . sodium chloride 10 mL/hr at 07/22/18 2300  . meropenem (MERREM) IV 1 g (07/31/18 0509)  . methocarbamol (ROBAXIN) IV    . TPN ADULT (ION) 83 mL/hr at 07/31/18 0200  . TPN ADULT (ION)       LOS: 24 days   Zannie Cove, MD [facility]  07/31/2018, 1:53 PM

## 2018-07-31 NOTE — Progress Notes (Signed)
Colostomy collection pouch and based plate changed , supplies for ostomy set was spared two sets in Pt room. Pt had formed stool, some minimal liquid stool leaking around ostomy.  Abdominal wound and JP drain dressing changed. Pt's vital signs remained stable, no distress had seen. Will continue to monitor.  Filiberto Pinks, BSN,RN,PCCN-CMC,SCS

## 2018-08-01 LAB — BASIC METABOLIC PANEL
Anion gap: 7 (ref 5–15)
BUN: 16 mg/dL (ref 8–23)
CO2: 20 mmol/L — ABNORMAL LOW (ref 22–32)
Calcium: 8.3 mg/dL — ABNORMAL LOW (ref 8.9–10.3)
Chloride: 108 mmol/L (ref 98–111)
Creatinine, Ser: 0.45 mg/dL (ref 0.44–1.00)
GFR calc Af Amer: >60 mL/min (ref >60)
GFR calc non Af Amer: >60 mL/min (ref >60)
Glucose, Bld: 107 mg/dL — ABNORMAL HIGH (ref 70–99)
Potassium: 4.3 mmol/L (ref 3.5–5.1)
Sodium: 135 mmol/L (ref 135–145)

## 2018-08-01 LAB — CBC
HCT: 26.1 % — ABNORMAL LOW (ref 36.0–46.0)
Hemoglobin: 8 g/dL — ABNORMAL LOW (ref 12.0–15.0)
MCH: 25.3 pg — ABNORMAL LOW (ref 26.0–34.0)
MCHC: 30.7 g/dL (ref 30.0–36.0)
MCV: 82.6 fL (ref 80.0–100.0)
Platelets: 390 10*3/uL (ref 150–400)
RBC: 3.16 MIL/uL — ABNORMAL LOW (ref 3.87–5.11)
RDW: 17.7 % — ABNORMAL HIGH (ref 11.5–15.5)
WBC: 11.1 10*3/uL — ABNORMAL HIGH (ref 4.0–10.5)
nRBC: 0.5 % — ABNORMAL HIGH (ref 0.0–0.2)

## 2018-08-01 LAB — GLUCOSE, CAPILLARY
Glucose-Capillary: 117 mg/dL — ABNORMAL HIGH (ref 70–99)
Glucose-Capillary: 119 mg/dL — ABNORMAL HIGH (ref 70–99)
Glucose-Capillary: 120 mg/dL — ABNORMAL HIGH (ref 70–99)
Glucose-Capillary: 124 mg/dL — ABNORMAL HIGH (ref 70–99)
Glucose-Capillary: 126 mg/dL — ABNORMAL HIGH (ref 70–99)

## 2018-08-01 LAB — MAGNESIUM: Magnesium: 2 mg/dL (ref 1.7–2.4)

## 2018-08-01 MED ORDER — TRAVASOL 10 % IV SOLN
INTRAVENOUS | Status: AC
  Administered 2018-08-01: 18:00:00 via INTRAVENOUS
  Filled 2018-08-01: qty 1195.2

## 2018-08-01 NOTE — Progress Notes (Signed)
16 Days Post-Op  Subjective: Still pleasantly confused this morning. Nurse reports very little intake yesterday.   Objective: Vital signs in last 24 hours: Temp:  [98.3 F (36.8 C)-98.7 F (37.1 C)] 98.7 F (37.1 C) (05/02 0825) Pulse Rate:  [57-81] 81 (05/02 1055) Resp:  [13-22] 13 (05/02 1054) BP: (135-155)/(52-79) 155/70 (05/02 1054) SpO2:  [99 %] 99 % (05/02 0400) Weight:  [61 kg] 61 kg (05/02 0400) Last BM Date: 07/31/18(colostomy)  Intake/Output from previous day: 05/01 0701 - 05/02 0700 In: 616 [I.V.:501; IV Piggyback:100] Out: 3252 [Urine:3250; Stool:2] Intake/Output this shift: Total I/O In: -  Out: 400 [Urine:400]  PE: Gen: awake, NAD Lungs: Normal effort Abd: soft, appropriately tender, wound is stable with no new changes.  Upper portion still with fibrinous exudate and dehiscence of the entire wound but greatest dimension at inferior aspect.  Colostomy bag with air and stool present.  Both JP drains with minimal output.      Lab Results:  Recent Labs    07/31/18 0500 08/01/18 0429  WBC 11.5* 11.1*  HGB 8.0* 8.0*  HCT 25.5* 26.1*  PLT 389 390   BMET Recent Labs    07/31/18 0500 08/01/18 0429  NA 135 135  K 4.2 4.3  CL 105 108  CO2 20* 20*  GLUCOSE 119* 107*  BUN 15 16  CREATININE 0.47 0.45  CALCIUM 8.5* 8.3*   PT/INR No results for input(s): LABPROT, INR in the last 72 hours. CMP     Component Value Date/Time   NA 135 08/01/2018 0429   NA 142 05/22/2018 1039   K 4.3 08/01/2018 0429   CL 108 08/01/2018 0429   CO2 20 (L) 08/01/2018 0429   GLUCOSE 107 (H) 08/01/2018 0429   BUN 16 08/01/2018 0429   BUN 10 05/22/2018 1039   CREATININE 0.45 08/01/2018 0429   CREATININE 0.72 08/07/2012 1016   CALCIUM 8.3 (L) 08/01/2018 0429   PROT 6.2 (L) 07/30/2018 0313   PROT 6.8 05/22/2018 1039   ALBUMIN 1.4 (L) 07/30/2018 0313   ALBUMIN 3.6 (L) 05/22/2018 1039   AST 85 (H) 07/30/2018 0313   ALT 79 (H) 07/30/2018 0313   ALKPHOS 252 (H)  07/30/2018 0313   BILITOT 0.3 07/30/2018 0313   BILITOT 0.3 05/22/2018 1039   GFRNONAA >60 08/01/2018 0429   GFRNONAA 88 08/07/2012 1016   GFRAA >60 08/01/2018 0429   GFRAA >89 08/07/2012 1016   Lipase     Component Value Date/Time   LIPASE 65 (H) 07/08/2018 1325       Studies/Results: No results found.  Anti-infectives: Anti-infectives (From admission, onward)   Start     Dose/Rate Route Frequency Ordered Stop   07/28/18 1000  fluconazole (DIFLUCAN) tablet 100 mg     100 mg Oral Daily 07/27/18 1219 08/04/18 0959   07/27/18 1400  fluconazole (DIFLUCAN) IVPB 200 mg     200 mg 100 mL/hr over 60 Minutes Intravenous  Once 07/27/18 1219 07/27/18 1426   07/15/18 1700  meropenem (MERREM) 1 g in sodium chloride 0.9 % 100 mL IVPB     1 g 200 mL/hr over 30 Minutes Intravenous Every 8 hours 07/15/18 1620     07/08/18 2300  vancomycin (VANCOCIN) 1,500 mg in sodium chloride 0.9 % 500 mL IVPB  Status:  Discontinued     1,500 mg 250 mL/hr over 120 Minutes Intravenous  Once 07/07/18 2128 07/07/18 2129   07/08/18 2300  vancomycin (VANCOCIN) 1,500 mg in sodium chloride 0.9 %  500 mL IVPB  Status:  Discontinued     1,500 mg 250 mL/hr over 120 Minutes Intravenous Every 24 hours 07/07/18 2129 07/08/18 0726   07/08/18 0600  ceFEPIme (MAXIPIME) 2 g in sodium chloride 0.9 % 100 mL IVPB  Status:  Discontinued     2 g 200 mL/hr over 30 Minutes Intravenous Every 8 hours 07/07/18 2124 07/13/18 1333   07/07/18 2130  vancomycin (VANCOCIN) 1,500 mg in sodium chloride 0.9 % 500 mL IVPB     1,500 mg 250 mL/hr over 120 Minutes Intravenous  Once 07/07/18 2123 07/08/18 0028   07/07/18 2115  vancomycin (VANCOCIN) IVPB 1000 mg/200 mL premix  Status:  Discontinued     1,000 mg 200 mL/hr over 60 Minutes Intravenous  Once 07/07/18 2110 07/07/18 2128   07/07/18 2115  ceFEPIme (MAXIPIME) 2 g in sodium chloride 0.9 % 100 mL IVPB     2 g 200 mL/hr over 30 Minutes Intravenous  Once 07/07/18 2110 07/07/18 2203        Assessment/Plan  A. 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 Derrell Lollingamirez, 4/16POD #16 -TID WTD dressing changesto help with necrotic tissue at base of wound -cont on soft dietand ensure although patient not eating anything.  -colostomy functioning  -mobilize and pulm toilet  -?DC JP drains tomorrow -cont [facility] and poor PO intake. we were going to try Cortrak placement for TFs but patient vomited a blood clot yesterday.  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  Given new information, we will defer on Cortrak placement to avoid complications given low integrity of stomach mucosa. -given lack of oral intake and no plans to do long-term TNA as nutritional support, palliative consult ordered. They are discussing with family.   FEN-soft diet/ensure,IVF,TPN, colace VTE -SCDs, heparindrip ID -Meropenem 4/15 >> Foley - Removed POD 1 Follow up -Dr. Derrell Lollingamirez POC: Dellis Filbertammy Kallam [phone] daughter/[name] [phone].    LOS: 25 days    Jacinto HalimMichael M Darrnell Mangiaracina , [facility] 08/01/2018, 11:55 AM Pager: [phone]

## 2018-08-01 NOTE — Progress Notes (Signed)
Daily Progress Note   Patient Name: [name]       Date: 08/01/2018 DOB: 1945-11-10  Age: 73 y.o. MRN#: [id number] Attending Physician: Domenic Polite, MD Primary Care Physician: Sharion Balloon, [name] Admit Date: 07/07/2018  Reason for Consultation/Follow-up: Establishing goals of care  Subjective: Patient is awake, much alert. She is aware that she is in the hospital, she tried participating with PT earlier today. Patient states that she met with her surgeon earlier today.   Length of Stay: 25  Current Medications: Scheduled Meds:  . Chlorhexidine Gluconate Cloth  6 each Topical Q0600  . docusate sodium  100 mg Oral BID  . feeding supplement (ENSURE ENLIVE)  237 mL Oral TID BM  . fluconazole  100 mg Oral Daily  . insulin aspart  0-9 Units Subcutaneous Q4H  . lidocaine  1 patch Transdermal Q24H  . magic mouthwash  10 mL Oral QID  . metoprolol tartrate  75 mg Oral BID  . nicotine  21 mg Transdermal Daily  . pantoprazole (PROTONIX) IV  40 mg Intravenous Q24H  . pregabalin  50 mg Oral BID  . saccharomyces boulardii  250 mg Oral BID  . sodium chloride flush  10-40 mL Intracatheter Q12H    Continuous Infusions: . sodium chloride 10 mL/hr at 07/22/18 2300  . meropenem (MERREM) IV 1 g (08/01/18 1447)  . methocarbamol (ROBAXIN) IV    . TPN ADULT (ION) 83 mL/hr at 07/31/18 1738  . TPN ADULT (ION)      PRN Meds: sodium chloride, alum & mag hydroxide-simeth, hydrALAZINE, ipratropium-albuterol, labetalol, methocarbamol (ROBAXIN) IV, morphine injection, oxyCODONE, phenol, promethazine, sodium chloride flush  Physical Exam         NAD Awake alert Has abdominal binder on Has colostomy Trace edema Non focal Regular S1 S 2  Vital Signs: BP 129/69 (BP Location: Left Arm)    Pulse 73   Temp 98.6 F (37 C) (Oral)   Resp 16   Ht _0  (1.702 m)   Wt 61 kg   SpO2 99%   BMI 21.06 kg/m  SpO2: SpO2: 99 % O2 Device: O2 Device: Room Air O2 Flow Rate: O2 Flow Rate (L/min): 2 L/min  Intake/output summary:   Intake/Output Summary (Last 24 hours) at 08/01/2018 1547 Last data filed at 08/01/2018 0915 Gross per  24 hour  Intake 606 ml  Output 2652 ml  Net -2046 ml   LBM: Last BM Date: 07/31/18(colostomy) Baseline Weight: Weight: 74.8 kg Most recent weight: Weight: 61 kg       Palliative Assessment/Data:      Patient Active Problem List   Diagnosis Date Noted  . Palliative care by specialist   . Goals of care, counseling/discussion   . Bowel perforation ([place]) 07/16/2018  . Lower abdominal pain   . Abnormal CT scan, colon   . Abnormal liver function   . Loss of weight   . Nausea without vomiting   . Sepsis ([place]) 07/07/2018  . Severe protein-calorie malnutrition ([place]) 07/07/2018  . Contusion of right hip   . COPD with acute exacerbation ([place])   . General weakness 05/28/2018  . Fall 05/28/2018  . UTI (urinary tract infection) 05/28/2018  . Pancreatitis 05/28/2018  . Lumbar radiculopathy 07/30/2016  . Depression 07/24/2015  . S/P lumbar fusion 01/27/2015  . GAD (generalized anxiety disorder) 06/13/2014  . Neck pain 07/14/2012  . S/P lumbar spinal fusion 07/14/2012  . Lumbar stenosis with neurogenic claudication 06/11/2012  . DDD (degenerative disc disease), lumbosacral 04/24/2012  . Spondylolisthesis at L5-S1 level 04/24/2012  . Hyperlipidemia with target LDL less than 100 06/16/2009  . TOBACCO ABUSE 06/16/2009  . Essential hypertension 06/16/2009  . Peripheral vascular disease ([place]) 06/16/2009  . GERD 06/16/2009  . Chronic back pain 06/16/2009  . CAROTID BRUIT 05/23/2009    Palliative Care Assessment & Plan   Patient Profile:    Assessment:  73 yo lady who has suspected chronic mesenteric ischemia, descending colon perforation, A fib,  PVD/ mesenteric ischemia. 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  Remains on TNA Cortrak not able to be placed and enteric feeds unable to be initiated due to concern for ulcerative gastritis with concern for ischemia.   Recommendations/Plan:  Discussed with patient, TRH MD, and then call placed and discussed with daughter Lynelle Smoke.   It is noted that aortogram may be done on Monday.  Continue TNA Continue to encourage PT Monitor hospital course.   Discussed extensively with the patient's daughter Lynelle Smoke over the phone:  [name] notes that the patient's current condition and hospital course has been a "roller coaster". She states that the patient is an independent lady.   I updated daughter about possibility of aortogram being attempted on Monday 08-03-2018. She is thankful for the information provided.   Discussed frankly and compassionately with [name] that the patient's overall condition remains complex, that TNA is not a long term feeding option, and that the patient has had a functional decline. She acknowledges that the patient is up against a lot of things, but hopes that her mother hasn't lost her will to live. Offered active listening and supportive care.     Code Status:    Code Status Orders  (From admission, onward)         Start     Ordered   07/30/18 1357  Do not attempt resuscitation (DNR)  Continuous    Question Answer Comment  In the event of cardiac or respiratory ARREST Do not call a "code blue"   In the event of cardiac or respiratory ARREST Do not perform Intubation, CPR, defibrillation or ACLS   In the event of cardiac or respiratory ARREST Use medication by any route, position, wound care, and other measures to relive pain and suffering. May use oxygen, suction and manual treatment  of airway obstruction as needed for comfort.      07/30/18 1357        Code Status History    Date Active Date Inactive Code Status Order ID  Comments User Context   07/07/2018 2335 07/30/2018 1357 Full Code [id number]  Jani Gravel, MD ED   05/28/2018 0119 06/08/2018 1936 Full Code [id number]  Reubin Milan, MD ED       Prognosis:   Unable to determine  Discharge Planning:  To Be Determined  Care plan was discussed with  Patient, patient's daughter Lynelle Smoke on the phone.   Thank you for allowing the Palliative Medicine Team to assist in the care of this patient.   Time In: 1400 Time Out: 1435 Total Time 35 Prolonged Time Billed  no       Greater than 50%  of this time was spent counseling and coordinating care related to the above assessment and plan.  Loistine Chance, MD [phone] Please contact Palliative Medicine Team phone at [phone] for questions and concerns.

## 2018-08-01 NOTE — Progress Notes (Addendum)
PROGRESS NOTE    Danielle FilbertLinda S Grant  ZOX:[id number] DOB: April 04, 1945 DOA: 07/07/2018 PCP: [name], [name], [name]    Brief Narrative:  73 year old female with history of chronic N/V/abdominal pain, A. fib, PVD status post right femoral thrombectomy on 05/31/2018, hypertension and chronic back pain presented with intractable nausea, vomiting, abdominal pain, new onset of diarrhea and about 30 pounds weight loss in the last several months.  [facility], -CTA abdomen and pelvis on 4/9 showed occlusion of celiac axis, or opacification of SMA and right iliac limb, patent SMV, colonic wall thickenings, rim enhancing 2.5 x 3.3 cm fluid collection around pancreatic tail continuous with a 3.1 x 1.0 cm rim-enhancing fluid collection along the anterior margin of the upper pole of left kidney. Also there is a 3.9 x 1.8 cm and 2.6 x 1.1 cm fluid collection in right pelvis/groin thought to be seroma from prior surgery.  -[facility]  Patient was also treated with cefepime for presumed pneumonia.  Gastroenterology and vascular surgery consulted.  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  She also had flexible sigmoidoscopy that showed only a 7 mm sessile polyp in the rectum but poor prep. -On 4/15, and continued to have significant leukocytosis, mild temperature and abdominal tenderness,  repeat CT abdomen and pelvis which showed frank perforation of the descending colon appears to be contained within a portion of the omentum, or occluded celiac axis, slightly decreased pancreatic tail lesion but no previously noted fluid collections.  General surgery and IR consulted. -Drain placement by IR, and ex lap, LOA, left sigmoid colectomy with descending colostomy (Hartmann's) by general surgery, Dr. Derrell Lollingamirez on 4/16.  Abdomen left open out of concern for infectious process and ileus. Started on TPN  on 07/18/2018.  Continues to have minimal oral intake.  Assessment & Plan:  Contained perforated descending colon/ chronic mesenteric ischemia -S/p IR drain placement on 4/16 which showed a large left colon defect,  -4/16 S/p Ex Lap, LOA, Left sigmoid colectomy, descending colostomy (Hartmann's), Dr. Derrell Lollingamirez -has multiple intraabd abscesses -suspected to have a possible stomach perforation as well -Postop ileus-resolved, poor Po intake  -open wound->Continue wet-to-dry dressings, general surgery following -on bowel regimen -Also has TNA ongoing, LFTs had worsened but appears to be improving now --Continue meropenem 4/15--per ID.   Dr. Allena KatzPatel discussed with ID, 4 weeks therapy recommended -Vascular following, plan was for mesenteric angiogram , this was deferred due to overall poor condition, now being considered for Monday -Unfortunately oral intake remains very poor, ongoing failure to thrive, discussed difficult situation with surgery and daughter Danielle Relicammy, continue supportive care for now, daughter agrees to DNR, family open to palliative care discussions -Continue goals of care discussions, if declines further will need to consider comfort focused care, now stable and plan for angiogram on Monday -discussed with general surgery Dr. 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 -Likely from occluded celiac axis -Continue PPI -Vascular surgery following per above  Significant weight loss/moderate protein calorie malnutrition: -Resulting from chronic bowel ischemia -TPN as tolerated -Colon biopsy from the surgery was negative for any malignancy, biopsy from body of stomach were suggestive of intestinal metaplasia but no malignancy.  History  of COPD: Stable  -Continue DuoNeb as needed   Peripheral vascular disease with dry gangrene: Stable. -Continue home statin -was on therapeutic anticoagulation with IV heparin per pharmacy, heparin was held following recurrent upper GI bleed  Essential hypertension:  -Continue home meds  Chronic pain: -Lower narcotics and Lyrica dose due to oversedation  Tobacco use disorder: -Cessation counseling -Continue Nicotine patch. Stable  Oral thrush. -Treated with with Diflucan.  Acute toxic/metabolic encephalopathy -Resolved, due to polypharmacy -Decreased narcotics and Lyrica dose  DVT prophylaxis: hold heparin Code Status: DNR Family Communication: no Family at bedside, called and updated daughter [name] 5/1 Disposition Plan: Uncertain at this time  Consultants:   Vascular surgery  Gastroenterology  Infectious disease  Interventional radiology  General surgery  Procedures:   EGD on 4/14  Flex sigmoidoscopy on 4/14  IR drain placement on 4/16 which showed a large left colon defect, 4/16  Ex Lap, LOA, Left sigmoid colectomy, descending colostomy (Hartmann's), Dr. Derrell Lolling, 4/16  Antimicrobials: Anti-infectives (From admission, onward)   Start     Dose/Rate Route Frequency Ordered Stop   07/28/18 1000  fluconazole (DIFLUCAN) tablet 100 mg     100 mg Oral Daily 07/27/18 1219 08/04/18 0959   07/27/18 1400  fluconazole (DIFLUCAN) IVPB 200 mg     200 mg 100 mL/hr over 60 Minutes Intravenous  Once 07/27/18 1219 07/27/18 1426   07/15/18 1700  meropenem (MERREM) 1 g in sodium chloride 0.9 % 100 mL IVPB     1 g 200 mL/hr over 30 Minutes Intravenous Every 8 hours 07/15/18 1620     07/08/18 2300  vancomycin (VANCOCIN) 1,500 mg in sodium chloride 0.9 % 500 mL IVPB  Status:  Discontinued     1,500 mg 250 mL/hr over 120 Minutes Intravenous  Once 07/07/18 2128 07/07/18 2129   07/08/18 2300  vancomycin (VANCOCIN) 1,500 mg in sodium chloride 0.9 % 500 mL IVPB  Status:  Discontinued      1,500 mg 250 mL/hr over 120 Minutes Intravenous Every 24 hours 07/07/18 2129 07/08/18 0726   07/08/18 0600  ceFEPIme (MAXIPIME) 2 g in sodium chloride 0.9 % 100 mL IVPB  Status:  Discontinued     2 g 200 mL/hr over 30 Minutes Intravenous Every 8 hours 07/07/18 2124 07/13/18 1333   07/07/18 2130  vancomycin (VANCOCIN) 1,500 mg in sodium chloride 0.9 % 500 mL IVPB     1,500 mg 250 mL/hr over 120 Minutes Intravenous  Once 07/07/18 2123 07/08/18 0028   07/07/18 2115  vancomycin (VANCOCIN) IVPB 1000 mg/200 mL premix  Status:  Discontinued     1,000 mg 200 mL/hr over 60 Minutes Intravenous  Once 07/07/18 2110 07/07/18 2128   07/07/18 2115  ceFEPIme (MAXIPIME) 2 g in sodium chloride 0.9 % 100 mL IVPB     2 g 200 mL/hr over 30 Minutes Intravenous  Once 07/07/18 2110 07/07/18 2203      Subjective: -feels ok, drank of few bites of ensure yesterday  Objective: Vitals:   08/01/18 0745 08/01/18 0825 08/01/18 1054 08/01/18 1055  BP:  (!) 149/65 (!) 155/70   Pulse:    81  Resp: 18 15 13    Temp:  98.7 F (37.1 C)    TempSrc:  Oral    SpO2:      Weight:      Height:        Intake/Output Summary (Last 24 hours) at 08/01/2018 1246 Last data filed at 08/01/2018 0915 Gross per 24 hour  Intake 606  ml  Output 2652 ml  Net -2046 ml   Filed Weights   07/30/18 0530 07/31/18 0500 08/01/18 0400  Weight: 62.1 kg 62.7 kg 61 kg    Examination: Gen: Pale, chronically ill-appearing female, laying in bed, alert awake oriented to self and place HEENT: PERRLA, Neck supple, no JVD Lungs: Decreased breath sounds at both bases CVS: RRR,No Gallops,Rubs or new Murmurs Abd: Soft, nontender, large abdominal binder over dressing, bowel sounds present Extremities: No edema Skin: no new rashes Data Reviewed: I have personally reviewed following labs and imaging studies  CBC: Recent Labs  Lab 07/27/18 0458  07/28/18 1820 07/29/18 0701 07/30/18 0313 07/30/18 2200 07/31/18 0500 08/01/18 0429  WBC 15.9*    < > 15.4* 15.1* 13.5* 10.9* 11.5* 11.1*  NEUTROABS 10.8*  --  8.7* 8.4* 7.6  --  6.4  --   HGB 7.7*   < > 7.1* 7.1* 7.1* 8.2* 8.0* 8.0*  HCT 25.2*   < > 23.7* 23.3* 23.4* 26.4* 25.5* 26.1*  MCV 82.1   < > 82.3 80.3 80.4 80.7 80.4 82.6  PLT 422*   < > 395 387 403* 386 389 390   < > = values in this interval not displayed.   Basic Metabolic Panel: Recent Labs  Lab 07/27/18 0458  07/28/18 1820 07/29/18 1016 07/30/18 0313 07/31/18 0500 08/01/18 0429  NA 136   < > 137 135 134* 135 135  K 4.6   < > 4.6 4.2 4.3 4.2 4.3  CL 105   < > 107 105 106 105 108  CO2 25   < > 22 21* 22 20* 20*  GLUCOSE 121*   < > 107* 139* 124* 119* 107*  BUN 11   < > CREATININE 0.42*   < > 0.37* 0.36* 0.39* 0.47 0.45  CALCIUM 8.4*   < > 8.5* 8.3* 8.3* 8.5* 8.3*  MG 1.9  --   --   --  1.6* 1.9 2.0  PHOS 3.9  --   --   --  3.9  --   --    < > = values in this interval not displayed.   GFR: Estimated Creatinine Clearance: 62.1 mL/min (by C-G formula based on SCr of 0.45 mg/dL). Liver Function Tests: Recent Labs  Lab 07/27/18 0458 07/28/18 0725 07/28/18 1820 07/29/18 1016 07/30/18 0313  AST 254* 248* 170* 104* 85*  ALT 130* 150* 125* 90* 79*  ALKPHOS 328* 290* 262* 269* 252*  BILITOT 0.4 0.5 0.5 0.5 0.3  PROT 6.0* 5.9* 6.3* 6.2* 6.2*  ALBUMIN 1.3* 1.3* 1.4* 1.3* 1.4*   No results for input(s): LIPASE, AMYLASE in the last 168 hours. Recent Labs  Lab 07/28/18 1820  AMMONIA 31   Coagulation Profile: Recent Labs  Lab 07/28/18 1820  INR 1.1   Cardiac Enzymes: No results for input(s): CKTOTAL, CKMB, CKMBINDEX, TROPONINI in the last 168 hours. BNP (last 3 results) No results for input(s): PROBNP in the last 8760 hours. HbA1C: No results for input(s): HGBA1C in the last 72 hours. CBG: Recent Labs  Lab 07/31/18 1643 07/31/18 2055 07/31/18 2356 08/01/18 0348 08/01/18 0739  GLUCAP 102* 117* 124* 119* 120*   Lipid Profile: No results for input(s): CHOL, HDL, LDLCALC, TRIG,  CHOLHDL, LDLDIRECT in the last 72 hours. Thyroid Function Tests: No results for input(s): TSH, T4TOTAL, FREET4, T3FREE, THYROIDAB in the last 72 hours. Anemia Panel: Recent Labs    07/30/18 1005 07/30/18 1050  VITAMINB12 815  --  FOLATE  --  16.3  FERRITIN 618*  --   TIBC 204*  --   IRON 11*  --   RETICCTPCT 2.2  --    Sepsis Labs: No results for input(s): PROCALCITON, LATICACIDVEN in the last 168 hours.  No results found for this or any previous visit (from the past 240 hour(s)).   Radiology Studies: No results found.  Scheduled Meds: . Chlorhexidine Gluconate Cloth  6 each Topical Q0600  . docusate sodium  100 mg Oral BID  . feeding supplement (ENSURE ENLIVE)  237 mL Oral TID BM  . fluconazole  100 mg Oral Daily  . insulin aspart  0-9 Units Subcutaneous Q4H  . lidocaine  1 patch Transdermal Q24H  . magic mouthwash  10 mL Oral QID  . metoprolol tartrate  75 mg Oral BID  . nicotine  21 mg Transdermal Daily  . pantoprazole (PROTONIX) IV  40 mg Intravenous Q24H  . pregabalin  50 mg Oral BID  . saccharomyces boulardii  250 mg Oral BID  . sodium chloride flush  10-40 mL Intracatheter Q12H   Continuous Infusions: . sodium chloride 10 mL/hr at 07/22/18 2300  . meropenem (MERREM) IV 1 g (08/01/18 1610)  . methocarbamol (ROBAXIN) IV    . TPN ADULT (ION) 83 mL/hr at 07/31/18 1738  . TPN ADULT (ION)       LOS: 25 days   Zannie Cove, MD [facility]  08/01/2018, 12:46 PM

## 2018-08-01 NOTE — Progress Notes (Signed)
[place] NOTE   Pharmacy Consult for TPN Indication: Post-op ileus  Patient Measurements: Height: 5' 7"  (170.2 cm) Weight: 134 lb 7.7 oz (61 kg) IBW/kg (Calculated) : 61.6 TPN AdjBW (KG): 62.3 Body mass index is 21.06 kg/m.  Assessment: 89-yo female with h/o chronic N/V/abdominal pain, A. fib, PVD status post right femoral thrombectomy on 05/31/2018, hypertension and chronic back pain presenting with intractable nausea, vomiting, abdominal pain, new onset of diarrhea and about 30 pounds weight loss in the last several months.   She was admitted to [facility] with a diagnosis of sepsis. 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 Also there is a 3.9 x 1.8 cm and 2.6 x 1.1 cm fluid collection in right pelvis/groin thought to be seroma from prior surgery. 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   Patient was transferred to [facility] for further management. S/p upper endoscopy 07/14/18 found Severe ulcerative gastritis most consistent ischemia, biopsied, results pending. CT showed perforation of descending colon. 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 Now with post-op ileus in the setting of 30 pound weight loss and minimal intake since admission.  5/1 We were going to try Cortrak placement for TFs but patient vomited a blood clot yesterday.  In review of EGD 3 weeks ago, patient was noted to have severe ulcerative gastritis likely secondary to ischemia.  Given new information, we  will defer on Cortrak placement to avoid complications given low integrity of stomach mucosa. -given lack of oral intake and no plans to do long-term TNA as nutritional support, will require palliative care consult for [place] meeting.  [name], daughter, has expressed that the family does not want patient to suffer and does not want long-term artificial feeding methods  GI: Open abdomen. Small abscesses on CT. 2 x JP drains with 10 mls output, nothing from colostomy, LBM 4/30, Colace and Miralax; Prealbumin >5 to 5.5. NGT pulled out 4/17- no plan to replace at this time. Diet changed from soft back to full liquids. Still with no intake.  Endo: No history of diabetes; CBGs <180 Insulin requirements in the past 24 hours: 0 unit Lytes: K and Phos wnl; Mg 2.0 (goal >2). Na 135. Corr Ca elevated at 10.6 (Alb 1.4) Renal: Scr 0.45; BUN 16; UOP 2.2 ml/kg/hr + 1 unmeasured occurrences, net +4.8L Pulm: RA Cards: SBP in 130-150s; HR 70s on metoprolol and heparin drip Hepatobil:AST/ALT trending down at 85/79 and Alk Phos down 252; TBili 0.3; TGs 63 Neuro: Confusion improving. Pain fairly controlled. ID: WBC 13.5>11.1; afebrile; Meropenem 4/15>> (4 weeks per ID)  TPN Access: PICC requested for today (4/18) TPN start date: 4/18 Nutritional Goals (per RD recommendation on 4/30): Kcal:  2000-2200 kcals (32-36 kcal/kg bw) Protein:  112-125g Pro  (1.8-2g/kg bw) Fluid:  2-2.2 L (68m/kcal)  Goal TPN rate is 83 ml/hr (This TPN will provide 120 g of protein, 269 g of dextrose, and 67 g of lipids which provides 2003  kCals per day, meeting 100% of patient needs)  Current  Nutrition:  Full liquid diet --poor appetite, 0% intake Ensure Enlive 237 mL po TID - charted 2 doses on 4/29 TPN at 83 ml/hr  Plan:  Continue TPN at goal rate of 83 ml/hr (full TPN, plan for placement of a feeding tube today and trickle feeds) This TPN will provide 119.5 g of protein, 268.9 g of dextrose, and 67.8 g of lipids which provides  2070.4  kCals per day, meeting 100% of patient needs Electrolytes in TPN: remove Ca today, continue increased Na and Mg. Others standard. Add MVI to TPN - trace elements are on back order, will only place in TPN on Monday, Wednesday and Friday Continue sensitive SSI and adjust as needed Add Thiamine and Folate toTPN Monitor TPN labs Mon and Thurs  F/u GOC discussions    Alanda Slim, PharmD, [facility] Clinical Pharmacist Please see AMION for all Pharmacists' Contact Phone Numbers 08/01/2018, 7:30 AM

## 2018-08-01 NOTE — Progress Notes (Signed)
Abdominal dressing change per MD order. Patient tolerated well.   Ernestina Columbia, RN

## 2018-08-02 LAB — GLUCOSE, CAPILLARY
Glucose-Capillary: 108 mg/dL — ABNORMAL HIGH (ref 70–99)
Glucose-Capillary: 112 mg/dL — ABNORMAL HIGH (ref 70–99)
Glucose-Capillary: 113 mg/dL — ABNORMAL HIGH (ref 70–99)
Glucose-Capillary: 118 mg/dL — ABNORMAL HIGH (ref 70–99)
Glucose-Capillary: 123 mg/dL — ABNORMAL HIGH (ref 70–99)
Glucose-Capillary: 140 mg/dL — ABNORMAL HIGH (ref 70–99)

## 2018-08-02 MED ORDER — TRAVASOL 10 % IV SOLN
INTRAVENOUS | Status: DC
  Administered 2018-08-02: 17:00:00 via INTRAVENOUS
  Filled 2018-08-02: qty 1195.2

## 2018-08-02 NOTE — Progress Notes (Addendum)
Changed pt's colostomy bag, brown stool with a minor to small amount of blood from stoma and in stool.  Provider Dr. Jomarie Longs made aware.  Per Dr. Jomarie Longs this is an expected finding for the patient with an ischemic bowel.

## 2018-08-02 NOTE — Progress Notes (Signed)
17 Days Post-Op  Subjective: Still pleasantly confused this morning. Nurse reports very little intake; on TPN  Objective: Vital signs in last 24 hours: Temp:  [98.1 F (36.7 C)-98.9 F (37.2 C)] 98.1 F (36.7 C) (05/03 0743) Pulse Rate:  [67-81] 75 (05/03 0743) Resp:  [13-24] 19 (05/03 0743) BP: (119-169)/(66-77) 143/69 (05/03 0743) SpO2:  [98 %-100 %] 100 % (05/03 0743) Weight:  [61.4 kg] 61.4 kg (05/03 0609) Last BM Date: 07/31/18(colostomy)  Intake/Output from previous day: 05/02 0701 - 05/03 0700 In: 240 [P.O.:240] Out: 2100 [Urine:2100] Intake/Output this shift: No intake/output data recorded.  PE: Gen: awake, NAD Lungs: Normal effort Abd: soft, nontender, nondistended, wound is stable with no new changes.  Upper portion still with fibrinous exudate and dehiscence of the entire wound but greatest dimension at inferior aspect.  Colostomy bag with air and stool present.  Both JP drains with minimal output, ss   Lab Results:  Recent Labs    07/31/18 0500 08/01/18 0429  WBC 11.5* 11.1*  HGB 8.0* 8.0*  HCT 25.5* 26.1*  PLT 389 390   BMET Recent Labs    07/31/18 0500 08/01/18 0429  NA 135 135  K 4.2 4.3  CL 105 108  CO2 20* 20*  GLUCOSE 119* 107*  BUN 15 16  CREATININE 0.47 0.45  CALCIUM 8.5* 8.3*   PT/INR No results for input(s): LABPROT, INR in the last 72 hours. CMP     Component Value Date/Time   NA 135 08/01/2018 0429   NA 142 05/22/2018 1039   K 4.3 08/01/2018 0429   CL 108 08/01/2018 0429   CO2 20 (L) 08/01/2018 0429   GLUCOSE 107 (H) 08/01/2018 0429   BUN 16 08/01/2018 0429   BUN 10 05/22/2018 1039   CREATININE 0.45 08/01/2018 0429   CREATININE 0.72 08/07/2012 1016   CALCIUM 8.3 (L) 08/01/2018 0429   PROT 6.2 (L) 07/30/2018 0313   PROT 6.8 05/22/2018 1039   ALBUMIN 1.4 (L) 07/30/2018 0313   ALBUMIN 3.6 (L) 05/22/2018 1039   AST 85 (H) 07/30/2018 0313   ALT 79 (H) 07/30/2018 0313   ALKPHOS 252 (H) 07/30/2018 0313   BILITOT 0.3  07/30/2018 0313   BILITOT 0.3 05/22/2018 1039   GFRNONAA >60 08/01/2018 0429   GFRNONAA 88 08/07/2012 1016   GFRAA >60 08/01/2018 0429   GFRAA >89 08/07/2012 1016   Lipase     Component Value Date/Time   LIPASE 65 (H) 07/08/2018 1325       Studies/Results: No results found.  Anti-infectives: Anti-infectives (From admission, onward)   Start     Dose/Rate Route Frequency Ordered Stop   07/28/18 1000  fluconazole (DIFLUCAN) tablet 100 mg     100 mg Oral Daily 07/27/18 1219 08/04/18 0959   07/27/18 1400  fluconazole (DIFLUCAN) IVPB 200 mg     200 mg 100 mL/hr over 60 Minutes Intravenous  Once 07/27/18 1219 07/27/18 1426   07/15/18 1700  meropenem (MERREM) 1 g in sodium chloride 0.9 % 100 mL IVPB     1 g 200 mL/hr over 30 Minutes Intravenous Every 8 hours 07/15/18 1620     07/08/18 2300  vancomycin (VANCOCIN) 1,500 mg in sodium chloride 0.9 % 500 mL IVPB  Status:  Discontinued     1,500 mg 250 mL/hr over 120 Minutes Intravenous  Once 07/07/18 2128 07/07/18 2129   07/08/18 2300  vancomycin (VANCOCIN) 1,500 mg in sodium chloride 0.9 % 500 mL IVPB  Status:  Discontinued  1,500 mg 250 mL/hr over 120 Minutes Intravenous Every 24 hours 07/07/18 2129 07/08/18 0726   07/08/18 0600  ceFEPIme (MAXIPIME) 2 g in sodium chloride 0.9 % 100 mL IVPB  Status:  Discontinued     2 g 200 mL/hr over 30 Minutes Intravenous Every 8 hours 07/07/18 2124 07/13/18 1333   07/07/18 2130  vancomycin (VANCOCIN) 1,500 mg in sodium chloride 0.9 % 500 mL IVPB     1,500 mg 250 mL/hr over 120 Minutes Intravenous  Once 07/07/18 2123 07/08/18 0028   07/07/18 2115  vancomycin (VANCOCIN) IVPB 1000 mg/200 mL premix  Status:  Discontinued     1,000 mg 200 mL/hr over 60 Minutes Intravenous  Once 07/07/18 2110 07/07/18 2128   07/07/18 2115  ceFEPIme (MAXIPIME) 2 g in sodium chloride 0.9 % 100 mL IVPB     2 g 200 mL/hr over 30 Minutes Intravenous  Once 07/07/18 2110 07/07/18 2203       Assessment/Plan  A.  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 Derrell Lolling, 4/16POD #17 -TID WTD dressing changesto help with necrotic tissue at base of wound -cont on soft dietand ensure although patient not eating anything.  -colostomy functioning well - brown stool in appliance -mobilize and pulm toilet  -cont TNA due to [facility] and poor PO intake. we were going to try Cortrak placement for TFs but patient vomited a blood clot 5/1.  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  Given new information, we will defer on Cortrak placement to avoid complications given low integrity of stomach mucosa. -given lack of oral intake and no plans to do long-term TNA as nutritional support, palliative consult ordered. They are discussing with family - appreciate assistance -Vascular planning angiogram tomorrow; anticipate npo after mn  FEN-soft diet/ensure,IVF,TPN, colace VTE -SCDs, heparindrip ID -Meropenem 4/15 >> Foley - Removed POD 1 Follow up -Dr. Derrell Lolling POC: [name] [phone] daughter/Cynthia Mayford Knife [phone].   LOS: 26 days   [name] , MD [facility] Surgery 08/02/2018, 8:44 AM Pager: [phone]

## 2018-08-02 NOTE — Progress Notes (Signed)
PHARMACY - ADULT TOTAL PARENTERAL NUTRITION CONSULT NOTE   Pharmacy Consult for TPN Indication: Post-op ileus  Patient Measurements: Height: '5\' 7"'$  (170.2 cm) Weight: 135 lb 5.8 oz (61.4 kg) IBW/kg (Calculated) : 61.6 TPN AdjBW (KG): 62.3 Body mass index is 21.2 kg/m.  Assessment: 55-yo female with h/o chronic N/V/abdominal pain, A. fib, PVD status post right femoral thrombectomy on 05/31/2018, hypertension and chronic back pain presenting with intractable nausea, vomiting, abdominal pain, new onset of diarrhea and about 30 pounds weight loss in the last several months.   She was admitted to [facility] with a diagnosis of sepsis. 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 Also there is a 3.9 x 1.8 cm and 2.6 x 1.1 cm fluid collection in right pelvis/groin thought to be seroma from prior surgery. 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   Patient was transferred to [facility] for further management. S/p upper endoscopy 07/14/18 found Severe ulcerative gastritis most consistent ischemia, biopsied, results pending. CT showed perforation of descending colon. 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 Now with post-op ileus in the setting of 30 pound weight loss and minimal intake since admission.  5/1 We were going to try Cortrak placement for TFs but patient vomited a blood clot yesterday.  In review of EGD 3 weeks ago, patient was noted to have severe ulcerative gastritis likely secondary to ischemia.  Given new information,  we will defer on Cortrak placement to avoid complications given low integrity of stomach mucosa. -given lack of oral intake and no plans to do long-term TNA as nutritional support, will require palliative care consult for [place] meeting.  [name], daughter, has expressed that the family does not want patient to suffer and does not want long-term artificial feeding methods 5/2 Aortagram planned for Monday 5/4  GI: Open abdomen. Small abscesses on CT. 2 x JP drains with 0 mls output, nothing from colostomy, LBM 4/30, Colace and Miralax; Prealbumin >5 to 5.5. NGT pulled out 4/17- no plan to replace at this time. Diet changed from soft back to full liquids. Drank 2 Ensures yesterday and 75% of one meal.  Endo: No history of diabetes; CBGs <130 Insulin requirements in the past 24 hours: 0 unit Lytes: K and Phos wnl; Mg 2.0 (goal >2). Na 135. Corr Ca at 10.4 (Alb 1.4) Renal: Scr 0.45; BUN 16; UOP 1.4 ml/kg/hr Pulm: RA Cards: SBP in 130-150s; HR 70s on metoprolol and heparin drip Hepatobil:AST/ALT trending down at 85/79 and Alk Phos down 252; TBili 0.3; TGs 63 Neuro: Confusion improving. Pain fairly controlled. ID: WBC 13.5>11.1; afebrile; Meropenem 4/15>> (4 weeks per ID)  TPN Access: PICC requested for today (4/18) TPN start date: 4/18 Nutritional Goals (per RD recommendation on 4/30): Kcal:  2000-2200 kcals (32-36 kcal/kg bw) Protein:  112-125g Pro  (1.8-2g/kg bw) Fluid:  2-2.2 L (50m/kcal)  Goal TPN rate is 83 ml/hr (This TPN will provide 120 g of protein, 269 g of dextrose, and 67 g of lipids which provides 2003  kCals per day, meeting 100% of  patient needs)  Current Nutrition:  Full liquid diet --poor appetite, 75% intake of one meal Ensure Enlive 237 mL po TID - charted 2 doses on 5/2 TPN at 83 ml/hr  Plan:  Continue TPN at goal rate of 83 ml/hr  This TPN will provide 119.5 g of protein, 268.9 g of dextrose, and 67.8 g of lipids which provides 2070.4  kCals per day, meeting 100% of patient  needs Electrolytes in TPN: remove Ca today, continue increased Na and Mg. Others standard. Add MVI to TPN - trace elements are on back order, will only place in TPN on Monday, Wednesday and Friday Continue sensitive SSI and adjust as needed Add Thiamine and Folate toTPN Monitor TPN labs Mon and Thurs  F/u GOC discussions and oral intake   Alanda Slim, PharmD, [facility] Clinical Pharmacist Please see AMION for all Pharmacists' Contact Phone Numbers 08/02/2018, 7:11 AM

## 2018-08-02 NOTE — Progress Notes (Signed)
Changed dressings on midsternal incision WD Kerlex and ABD, JP drain sites, and Xeroform/Allyvn on coccyx, and dry dressing on necrotic rt toes.

## 2018-08-02 NOTE — Progress Notes (Signed)
PROGRESS NOTE    [name]  WUJ:[id number] DOB: 1946-01-05 DOA: 07/07/2018 PCP: Junie Spencer, [name]    Brief Narrative:  73 year old female with history of chronic N/V/abdominal pain, A. fib, PVD status post right femoral thrombectomy on 05/31/2018, hypertension and chronic back pain presented with intractable nausea, vomiting, abdominal pain, new onset of diarrhea and about 30 pounds weight loss in the last several months.  Admitted to [facility]  on 07/07/2018, -CTA abdomen and pelvis on 4/9 showed occlusion of celiac axis, or opacification of SMA and right iliac limb, patent SMV, colonic wall thickenings, rim enhancing 2.5 x 3.3 cm fluid collection around pancreatic tail continuous with a 3.1 x 1.0 cm rim-enhancing fluid collection along the anterior margin of the upper pole of left kidney. Also there is a 3.9 x 1.8 cm and 2.6 x 1.1 cm fluid collection in right pelvis/groin thought to be seroma from prior surgery.  -Patient was transferred to [facility] for further management.  Patient was also treated with cefepime for presumed pneumonia.  Gastroenterology and vascular surgery consulted.  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  She also had flexible sigmoidoscopy that showed only a 7 mm sessile polyp in the rectum but poor prep. -On 4/15, and continued to have significant leukocytosis, mild temperature and abdominal tenderness,  repeat CT abdomen and pelvis which showed frank perforation of the descending colon appears to be contained within a portion of the omentum, or occluded celiac axis, slightly decreased pancreatic tail lesion but no previously noted fluid collections.  General surgery and IR consulted. -Drain placement by IR, and ex lap, LOA, left sigmoid colectomy with descending colostomy (Hartmann's) by general surgery, Dr. Derrell Lolling on 4/16.  Abdomen left open out of concern for infectious process and ileus. Started on TPN  on 07/18/2018.  Continues to have minimal oral intake.  Assessment & Plan:  Contained perforated descending colon/ chronic mesenteric ischemia -S/p IR drain placement on 4/16 which showed a large left colon defect,  -4/16 S/p Ex Lap, LOA, Left sigmoid colectomy, descending colostomy (Hartmann's), Dr. Derrell Lolling -has multiple intra abdominal abscesses -suspected to have a possible stomach perforation as well -Postop ileus-resolved, but very poor Po intake, atleast in part from ischemic stomach -open wound->Continue wet-to-dry dressings, general surgery following -Also has TNA ongoing, LFTs had worsened but appears to be improving now -Continue meropenem 4/15--per ID.   Dr. Allena Katz discussed with ID, 4 weeks therapy recommended -Vascular following, plan was for mesenteric angiogram , this was deferred due to overall poor condition, now being considered for Monday -Unfortunately oral intake remains very poor, ongoing failure to thrive, discussed difficult situation with surgery and daughter Danielle Relic, continue supportive care for now, daughter agreed to DNR, family open to palliative care discussions -Continue goals of care discussions, if declines further will need to consider comfort focused care, now stable and plan for angiogram tomorrow -discussed with general surgery Dr. 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 -Likely from occluded celiac axis -Continue PPI -Vascular surgery following per above  Significant weight loss/moderate protein calorie malnutrition: -Resulting from chronic bowel ischemia -TPN as tolerated -Colon biopsy from the surgery was negative for any malignancy, biopsy from body of stomach were suggestive of  intestinal metaplasia but  no malignancy.  History of COPD: Stable -Continue DuoNeb as needed   Peripheral vascular disease with dry gangrene: Stable. -Continue home statin -was on therapeutic anticoagulation with IV heparin per pharmacy, heparin was held following recurrent upper GI bleed  Essential hypertension:  -Continue home meds  Chronic pain: -Lower narcotics and Lyrica dose due to oversedation  Tobacco use disorder: -Cessation counseling -Continue Nicotine patch. Stable  Oral thrush. -Treated with with Diflucan.  Acute toxic/metabolic encephalopathy -Resolved, due to polypharmacy -Decreased narcotics and Lyrica dose  DVT prophylaxis: hold heparin Code Status: DNR Family Communication: no Family at bedside, called and updated daughter [name] 5/1 Disposition Plan: Uncertain at this time  Consultants:   Vascular surgery  Gastroenterology  Infectious disease  Interventional radiology  General surgery  Procedures:   EGD on 4/14  Flex sigmoidoscopy on 4/14  IR drain placement on 4/16 which showed a large left colon defect, 4/16  Ex Lap, LOA, Left sigmoid colectomy, descending colostomy (Hartmann's), Dr. Derrell Grant, 4/16  Antimicrobials: Anti-infectives (From admission, onward)   Start     Dose/Rate Route Frequency Ordered Stop   07/28/18 1000  fluconazole (DIFLUCAN) tablet 100 mg     100 mg Oral Daily 07/27/18 1219 08/04/18 0959   07/27/18 1400  fluconazole (DIFLUCAN) IVPB 200 mg     200 mg 100 mL/hr over 60 Minutes Intravenous  Once 07/27/18 1219 07/27/18 1426   07/15/18 1700  meropenem (MERREM) 1 g in sodium chloride 0.9 % 100 mL IVPB     1 g 200 mL/hr over 30 Minutes Intravenous Every 8 hours 07/15/18 1620     07/08/18 2300  vancomycin (VANCOCIN) 1,500 mg in sodium chloride 0.9 % 500 mL IVPB  Status:  Discontinued     1,500 mg 250 mL/hr over 120 Minutes Intravenous  Once 07/07/18 2128 07/07/18 2129   07/08/18 2300  vancomycin (VANCOCIN) 1,500 mg in sodium chloride 0.9  % 500 mL IVPB  Status:  Discontinued     1,500 mg 250 mL/hr over 120 Minutes Intravenous Every 24 hours 07/07/18 2129 07/08/18 0726   07/08/18 0600  ceFEPIme (MAXIPIME) 2 g in sodium chloride 0.9 % 100 mL IVPB  Status:  Discontinued     2 g 200 mL/hr over 30 Minutes Intravenous Every 8 hours 07/07/18 2124 07/13/18 1333   07/07/18 2130  vancomycin (VANCOCIN) 1,500 mg in sodium chloride 0.9 % 500 mL IVPB     1,500 mg 250 mL/hr over 120 Minutes Intravenous  Once 07/07/18 2123 07/08/18 0028   07/07/18 2115  vancomycin (VANCOCIN) IVPB 1000 mg/200 mL premix  Status:  Discontinued     1,000 mg 200 mL/hr over 60 Minutes Intravenous  Once 07/07/18 2110 07/07/18 2128   07/07/18 2115  ceFEPIme (MAXIPIME) 2 g in sodium chloride 0.9 % 100 mL IVPB     2 g 200 mL/hr over 30 Minutes Intravenous  Once 07/07/18 2110 07/07/18 2203      Subjective: -feels ok, drank of few bites of ensure yesterday  Objective: Vitals:   08/02/18 0609 08/02/18 0743 08/02/18 1200 08/02/18 1240  BP:  (!) 143/69 130/68 130/68  Pulse:  75 83 73  Resp: 20 19    Temp:  98.1 F (36.7 C)  98.2 F (36.8 C)  TempSrc:  Oral  Oral  SpO2:  100%  98%  Weight: 61.4 kg     Height:        Intake/Output Summary (Last 24 hours) at 08/02/2018 1334 Last data filed  at 08/02/2018 1200 Gross per 24 hour  Intake 120 ml  Output 2200 ml  Net -2080 ml   Filed Weights   07/31/18 0500 08/01/18 0400 08/02/18 4696  Weight: 62.7 kg 61 kg 61.4 kg    Examination: Gen: Frail, chronically ill-appearing female, awake alert oriented to self and partly to place only HEENT: PERRLA, Neck supple Lungs: Decreased breath sounds at both bases CVS: S1-S2/regular rate rhythm Abd: Soft, nontender, large abdominal binder over dressing, bowel sounds present Extremities: No edema Skin: no new rashes Data Reviewed: I have personally reviewed following labs and imaging studies  CBC: Recent Labs  Lab 07/27/18 0458  07/28/18 1820 07/29/18 0701  07/30/18 0313 07/30/18 2200 07/31/18 0500 08/01/18 0429  WBC 15.9*   < > 15.4* 15.1* 13.5* 10.9* 11.5* 11.1*  NEUTROABS 10.8*  --  8.7* 8.4* 7.6  --  6.4  --   HGB 7.7*   < > 7.1* 7.1* 7.1* 8.2* 8.0* 8.0*  HCT 25.2*   < > 23.7* 23.3* 23.4* 26.4* 25.5* 26.1*  MCV 82.1   < > 82.3 80.3 80.4 80.7 80.4 82.6  PLT 422*   < > 395 387 403* 386 389 390   < > = values in this interval not displayed.   Basic Metabolic Panel: Recent Labs  Lab 07/27/18 0458  07/28/18 1820 07/29/18 1016 07/30/18 0313 07/31/18 0500 08/01/18 0429  NA 136   < > 137 135 134* 135 135  K 4.6   < > 4.6 4.2 4.3 4.2 4.3  CL 105   < > 107 105 106 105 108  CO2 25   < > 22 21* 22 20* 20*  GLUCOSE 121*   < > 107* 139* 124* 119* 107*  BUN 11   < > CREATININE 0.42*   < > 0.37* 0.36* 0.39* 0.47 0.45  CALCIUM 8.4*   < > 8.5* 8.3* 8.3* 8.5* 8.3*  MG 1.9  --   --   --  1.6* 1.9 2.0  PHOS 3.9  --   --   --  3.9  --   --    < > = values in this interval not displayed.   GFR: Estimated Creatinine Clearance: 62.5 mL/min (by C-G formula based on SCr of 0.45 mg/dL). Liver Function Tests: Recent Labs  Lab 07/27/18 0458 07/28/18 0725 07/28/18 1820 07/29/18 1016 07/30/18 0313  AST 254* 248* 170* 104* 85*  ALT 130* 150* 125* 90* 79*  ALKPHOS 328* 290* 262* 269* 252*  BILITOT 0.4 0.5 0.5 0.5 0.3  PROT 6.0* 5.9* 6.3* 6.2* 6.2*  ALBUMIN 1.3* 1.3* 1.4* 1.3* 1.4*   No results for input(s): LIPASE, AMYLASE in the last 168 hours. Recent Labs  Lab 07/28/18 1820  AMMONIA 31   Coagulation Profile: Recent Labs  Lab 07/28/18 1820  INR 1.1   Cardiac Enzymes: No results for input(s): CKTOTAL, CKMB, CKMBINDEX, TROPONINI in the last 168 hours. BNP (last 3 results) No results for input(s): PROBNP in the last 8760 hours. HbA1C: No results for input(s): HGBA1C in the last 72 hours. CBG: Recent Labs  Lab 08/01/18 1610 08/01/18 2031 08/02/18 0044 08/02/18 0555 08/02/18 1159  GLUCAP 117* 118* 108* 112*  140*   Lipid Profile: No results for input(s): CHOL, HDL, LDLCALC, TRIG, CHOLHDL, LDLDIRECT in the last 72 hours. Thyroid Function Tests: No results for input(s): TSH, T4TOTAL, FREET4, T3FREE, THYROIDAB in the last 72 hours. Anemia Panel: No results for input(s): VITAMINB12, FOLATE, FERRITIN,  TIBC, IRON, RETICCTPCT in the last 72 hours. Sepsis Labs: No results for input(s): PROCALCITON, LATICACIDVEN in the last 168 hours.  No results found for this or any previous visit (from the past 240 hour(s)).   Radiology Studies: No results found.  Scheduled Meds: . Chlorhexidine Gluconate Cloth  6 each Topical Q0600  . docusate sodium  100 mg Oral BID  . feeding supplement (ENSURE ENLIVE)  237 mL Oral TID BM  . fluconazole  100 mg Oral Daily  . insulin aspart  0-9 Units Subcutaneous Q4H  . lidocaine  1 patch Transdermal Q24H  . magic mouthwash  10 mL Oral QID  . metoprolol tartrate  75 mg Oral BID  . nicotine  21 mg Transdermal Daily  . pantoprazole (PROTONIX) IV  40 mg Intravenous Q24H  . pregabalin  50 mg Oral BID  . saccharomyces boulardii  250 mg Oral BID  . sodium chloride flush  10-40 mL Intracatheter Q12H   Continuous Infusions: . sodium chloride 10 mL/hr at 07/22/18 2300  . meropenem (MERREM) IV 1 g (08/02/18 0641)  . methocarbamol (ROBAXIN) IV    . TPN ADULT (ION) 83 mL/hr at 08/01/18 1737  . TPN ADULT (ION)       LOS: 26 days   Zannie Cove, MD [facility]  08/02/2018, 1:34 PM

## 2018-08-02 NOTE — Progress Notes (Signed)
Pharmacy Antibiotic Note  [name] is a 73 y.o. female admitted on 07/07/2018 currently with intraabdominal absesses.  Pharmacy has been consulted for Merrem dosing.   ID: [id number]  Meropenem for IA perf 4/15. Multiple intraabdominal abscesses. Open abdomen.  WBC 18.8>13.5>11.1, afeb, SCr wnl, MMW/Fluconazole for thrush.  Cefepime 4/7 >> 4/13 (PNA, sepsis) Vanco 4/7 >> 4/8 Meropenem 4/15>> Fluconazole 4/28 >> (5/15)  4/7 BCx x 2: negative 4/8 mrsa pcr negative    Plan: Merrem 1g IV q 8hrs. (extended to 4 wks, ID note 5/1)  Height: 5\' 7"  (170.2 cm) Weight: 135 lb 5.8 oz (61.4 kg) IBW/kg (Calculated) : 61.6  Temp (24hrs), Avg:98.5 F (36.9 C), Min:98.1 F (36.7 C), Max:98.9 F (37.2 C)  Recent Labs  Lab 07/28/18 1820 07/29/18 0701 07/29/18 1016 07/30/18 0313 07/30/18 2200 07/31/18 0500 08/01/18 0429  WBC 15.4* 15.1*  --  13.5* 10.9* 11.5* 11.1*  CREATININE 0.37*  --  0.36* 0.39*  --  0.47 0.45    Estimated Creatinine Clearance: 62.5 mL/min (by C-G formula based on SCr of 0.45 mg/dL).    No Known Allergies   [name] S. [name], PharmD, BCPS Clinical Staff Pharmacist [name] 08/02/2018 10:24 AM

## 2018-08-02 NOTE — Progress Notes (Signed)
PICC line dressing changed, new antimicrobial disc, and stat lock.  Site clean, dry, and intact..  Pt tolerated procedure well.

## 2018-08-03 ENCOUNTER — Inpatient Hospital Stay (HOSPITAL_COMMUNITY)
Admission: EM | Disposition: A | Discharge status: Hospice | Payer: Self-pay | Source: Home / Self Care | Attending: Internal Medicine

## 2018-08-03 ENCOUNTER — Encounter (HOSPITAL_COMMUNITY): Payer: Self-pay | Admitting: Vascular Surgery

## 2018-08-03 DIAGNOSIS — K55059 Acute (reversible) ischemia of intestine, part and extent unspecified: Secondary | ICD-10-CM

## 2018-08-03 DIAGNOSIS — I96 Gangrene, not elsewhere classified: Secondary | ICD-10-CM

## 2018-08-03 HISTORY — PX: VISCERAL ANGIOGRAPHY: CATH118276

## 2018-08-03 HISTORY — PX: ABDOMINAL AORTOGRAM W/LOWER EXTREMITY: CATH118223

## 2018-08-03 LAB — CBC
HCT: 19 % — ABNORMAL LOW (ref 36.0–46.0)
Hemoglobin: 5.8 g/dL — CL (ref 12.0–15.0)
MCH: 25.6 pg — ABNORMAL LOW (ref 26.0–34.0)
MCHC: 30.5 g/dL (ref 30.0–36.0)
MCV: 83.7 fL (ref 80.0–100.0)
Platelets: 477 10*3/uL — ABNORMAL HIGH (ref 150–400)
RBC: 2.27 MIL/uL — ABNORMAL LOW (ref 3.87–5.11)
RDW: 17.8 % — ABNORMAL HIGH (ref 11.5–15.5)
WBC: 11.1 10*3/uL — ABNORMAL HIGH (ref 4.0–10.5)
nRBC: 0 % (ref 0.0–0.2)

## 2018-08-03 LAB — PROTIME-INR
INR: 1.3 — ABNORMAL HIGH (ref 0.8–1.2)
Prothrombin Time: 15.7 seconds — ABNORMAL HIGH (ref 11.4–15.2)

## 2018-08-03 LAB — GLUCOSE, CAPILLARY
Glucose-Capillary: 100 mg/dL — ABNORMAL HIGH (ref 70–99)
Glucose-Capillary: 106 mg/dL — ABNORMAL HIGH (ref 70–99)
Glucose-Capillary: 113 mg/dL — ABNORMAL HIGH (ref 70–99)
Glucose-Capillary: 114 mg/dL — ABNORMAL HIGH (ref 70–99)
Glucose-Capillary: 117 mg/dL — ABNORMAL HIGH (ref 70–99)
Glucose-Capillary: 129 mg/dL — ABNORMAL HIGH (ref 70–99)

## 2018-08-03 LAB — CBC WITH DIFFERENTIAL/PLATELET
Abs Immature Granulocytes: 0.1 10*3/uL — ABNORMAL HIGH (ref 0.00–0.07)
Basophils Absolute: 0.1 10*3/uL (ref 0.0–0.1)
Basophils Relative: 1 %
Eosinophils Absolute: 0.3 10*3/uL (ref 0.0–0.5)
Eosinophils Relative: 4 %
HCT: 28.3 % — ABNORMAL LOW (ref 36.0–46.0)
Hemoglobin: 8.9 g/dL — ABNORMAL LOW (ref 12.0–15.0)
Immature Granulocytes: 1 %
Lymphocytes Relative: 23 %
Lymphs Abs: 2 10*3/uL (ref 0.7–4.0)
MCH: 25.9 pg — ABNORMAL LOW (ref 26.0–34.0)
MCHC: 31.4 g/dL (ref 30.0–36.0)
MCV: 82.5 fL (ref 80.0–100.0)
Monocytes Absolute: 1.6 10*3/uL — ABNORMAL HIGH (ref 0.1–1.0)
Monocytes Relative: 18 %
Neutro Abs: 4.7 10*3/uL (ref 1.7–7.7)
Neutrophils Relative %: 53 %
Platelets: 427 10*3/uL — ABNORMAL HIGH (ref 150–400)
RBC: 3.43 MIL/uL — ABNORMAL LOW (ref 3.87–5.11)
RDW: 17.8 % — ABNORMAL HIGH (ref 11.5–15.5)
WBC: 8.7 10*3/uL (ref 4.0–10.5)
nRBC: 0.2 % (ref 0.0–0.2)

## 2018-08-03 LAB — DIFFERENTIAL
Abs Immature Granulocytes: 0.15 10*3/uL — ABNORMAL HIGH (ref 0.00–0.07)
Basophils Absolute: 0.1 10*3/uL (ref 0.0–0.1)
Basophils Relative: 1 %
Eosinophils Absolute: 0.5 10*3/uL (ref 0.0–0.5)
Eosinophils Relative: 4 %
Immature Granulocytes: 1 %
Lymphocytes Relative: 25 %
Lymphs Abs: 2.8 10*3/uL (ref 0.7–4.0)
Monocytes Absolute: 2 10*3/uL — ABNORMAL HIGH (ref 0.1–1.0)
Monocytes Relative: 18 %
Neutro Abs: 5.7 10*3/uL (ref 1.7–7.7)
Neutrophils Relative %: 51 %

## 2018-08-03 LAB — PHOSPHORUS: Phosphorus: 4.3 mg/dL (ref 2.5–4.6)

## 2018-08-03 LAB — COMPREHENSIVE METABOLIC PANEL
ALT: 164 U/L — ABNORMAL HIGH (ref 0–44)
AST: 199 U/L — ABNORMAL HIGH (ref 15–41)
Albumin: 1.6 g/dL — ABNORMAL LOW (ref 3.5–5.0)
Alkaline Phosphatase: 257 U/L — ABNORMAL HIGH (ref 38–126)
Anion gap: 8 (ref 5–15)
BUN: 17 mg/dL (ref 8–23)
CO2: 20 mmol/L — ABNORMAL LOW (ref 22–32)
Calcium: 8.5 mg/dL — ABNORMAL LOW (ref 8.9–10.3)
Chloride: 107 mmol/L (ref 98–111)
Creatinine, Ser: <0.3 mg/dL — ABNORMAL LOW (ref 0.44–1.00)
Glucose, Bld: 150 mg/dL — ABNORMAL HIGH (ref 70–99)
Potassium: 4.6 mmol/L (ref 3.5–5.1)
Sodium: 135 mmol/L (ref 135–145)
Total Bilirubin: 0.5 mg/dL (ref 0.3–1.2)
Total Protein: 6.8 g/dL (ref 6.5–8.1)

## 2018-08-03 LAB — MAGNESIUM: Magnesium: 2.1 mg/dL (ref 1.7–2.4)

## 2018-08-03 LAB — PREALBUMIN: Prealbumin: 11.2 mg/dL — ABNORMAL LOW (ref 18–38)

## 2018-08-03 LAB — TRIGLYCERIDES: Triglycerides: 105 mg/dL (ref <150)

## 2018-08-03 LAB — PREPARE RBC (CROSSMATCH)

## 2018-08-03 SURGERY — ABDOMINAL AORTOGRAM W/LOWER EXTREMITY
Anesthesia: LOCAL

## 2018-08-03 MED ORDER — IODIXANOL 320 MG/ML IV SOLN
INTRAVENOUS | Status: DC | PRN
  Administered 2018-08-03: 11:00:00 175 mL via INTRA_ARTERIAL

## 2018-08-03 MED ORDER — HEPARIN (PORCINE) IN NACL 1000-0.9 UT/500ML-% IV SOLN
INTRAVENOUS | Status: DC | PRN
  Administered 2018-08-03 (×2): 500 mL

## 2018-08-03 MED ORDER — ACETAMINOPHEN 325 MG PO TABS: 650.0000 mg | ORAL_TABLET | ORAL | Status: DC | PRN

## 2018-08-03 MED ORDER — LABETALOL HCL 5 MG/ML IV SOLN: 10.0000 mg | INTRAVENOUS | Status: DC | PRN

## 2018-08-03 MED ORDER — SODIUM CHLORIDE 0.9% IV SOLUTION: Freq: Once | INTRAVENOUS | Status: DC

## 2018-08-03 MED ORDER — ADULT MULTIVITAMIN W/MINERALS CH
1.0000 | ORAL_TABLET | Freq: Every day | ORAL | Status: DC
  Administered 2018-08-04 – 2018-08-08 (×5): 1 via ORAL
  Filled 2018-08-03 (×5): qty 1

## 2018-08-03 MED ORDER — FOLIC ACID 1 MG PO TABS
1.0000 mg | ORAL_TABLET | Freq: Every day | ORAL | Status: DC
  Administered 2018-08-04 – 2018-08-08 (×5): 1 mg via ORAL
  Filled 2018-08-03 (×5): qty 1

## 2018-08-03 MED ORDER — SODIUM CHLORIDE 0.9% FLUSH: 3.0000 mL | INTRAVENOUS | Status: DC | PRN

## 2018-08-03 MED ORDER — SODIUM CHLORIDE 0.9 % IV SOLN: 250.0000 mL | INTRAVENOUS | Status: DC | PRN

## 2018-08-03 MED ORDER — HYDRALAZINE HCL 20 MG/ML IJ SOLN: 5.0000 mg | INTRAMUSCULAR | Status: DC | PRN

## 2018-08-03 MED ORDER — PANTOPRAZOLE SODIUM 40 MG PO TBEC
40.0000 mg | DELAYED_RELEASE_TABLET | Freq: Every day | ORAL | Status: DC
  Administered 2018-08-04 – 2018-08-08 (×5): 40 mg via ORAL
  Filled 2018-08-03 (×5): qty 1

## 2018-08-03 MED ORDER — SODIUM CHLORIDE 0.9% FLUSH
3.0000 mL | Freq: Two times a day (BID) | INTRAVENOUS | Status: DC
  Administered 2018-08-03 – 2018-08-08 (×5): 3 mL via INTRAVENOUS

## 2018-08-03 MED ORDER — SODIUM CHLORIDE 0.9 % IV SOLN
INTRAVENOUS | Status: AC
  Administered 2018-08-03: 12:00:00 via INTRAVENOUS

## 2018-08-03 MED ORDER — ONDANSETRON HCL 4 MG/2ML IJ SOLN
4.0000 mg | Freq: Four times a day (QID) | INTRAMUSCULAR | Status: DC | PRN
  Filled 2018-08-03: qty 2

## 2018-08-03 MED ORDER — VITAMIN B-1 100 MG PO TABS
100.0000 mg | ORAL_TABLET | Freq: Every day | ORAL | Status: DC
  Administered 2018-08-04 – 2018-08-08 (×5): 100 mg via ORAL
  Filled 2018-08-03 (×5): qty 1

## 2018-08-03 MED ORDER — LIDOCAINE HCL (PF) 1 % IJ SOLN
INTRAMUSCULAR | Status: AC
  Filled 2018-08-03: qty 30

## 2018-08-03 MED ORDER — TRAVASOL 10 % IV SOLN
INTRAVENOUS | Status: DC
  Filled 2018-08-03: qty 1195.2

## 2018-08-03 MED ORDER — HEPARIN (PORCINE) IN NACL 1000-0.9 UT/500ML-% IV SOLN
INTRAVENOUS | Status: AC
  Filled 2018-08-03: qty 1000

## 2018-08-03 SURGICAL SUPPLY — 11 items
CATH ANGIO 5F PIGTAIL 65CM (CATHETERS) ×1 IMPLANT
CATH SOFT-VU 4F 65 STRAIGHT (CATHETERS) IMPLANT
CATH SOFT-VU STRAIGHT 4F 65CM (CATHETERS) ×3
CATH SOS OMNI O 5F 80CM (CATHETERS) ×1 IMPLANT
KIT PV (KITS) ×3 IMPLANT
SHEATH PINNACLE 5F 10CM (SHEATH) ×1 IMPLANT
SHEATH PROBE COVER 6X72 (BAG) ×1 IMPLANT
SYR MEDRAD MARK V 150ML (SYRINGE) ×1 IMPLANT
TRANSDUCER W/STOPCOCK (MISCELLANEOUS) ×3 IMPLANT
TRAY PV CATH (CUSTOM PROCEDURE TRAY) ×3 IMPLANT
WIRE HITORQ VERSACORE ST 145CM (WIRE) ×1 IMPLANT

## 2018-08-03 NOTE — Consult Note (Addendum)
WOC Nurse ostomy follow up Stoma type/location: RUQ colostomy Stomal assessment/size: 1 and 5/8 inches, round red and moist Peristomal assessment: intact Treatment options for stomal/peristomal skin: with next change requires skin barrier ring due to uneven stomal plane Output brown stool Ostomy pouching: 2pc. 2 and 1/4 inch pouching system. Will need skin barrier ring with next pouch change Education provided: Patient is not participating in ostomy care education session, will not attempt Lock and Roll closure today. Will require total care for ostomy at discharge unless she become more participative in the future. Enrolled patient in [employer] Start Discharge program: Yes, previously Supplies and teaching materials at bedside except for skin barrier rings. Applied abd binder after pouch change today. WOC nursing team will continue to follow and see at least weekly attempting to teach regarding ostomy care. We will remain available to this patient, the nursing and medical teams.   Thanks, Ladona Mow, MSN, RN, GNP, [facility]  Pager# [phone]

## 2018-08-03 NOTE — Op Note (Addendum)
Procedure: #1 abdominal aortogram with selective angiogram superior mesenteric artery, pressure gradient measurement  2.  Abdominal aortogram with bilateral lower extremity runoff  Preoperative diagnosis: #1 chronic mesenteric ischemia  2.  Gangrene left foot  Postoperative diagnosis: Same  Anesthesia: Local  Operative findings: #1 cannulation right common femoral artery 5 French sheath  2.  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  50% stenosis origin superior mesenteric artery with no pressure gradient across this stenosis  4.  Minimal filling of distal arcades of superior mesenteric artery presumably due to distal disease  5.  Patent right aortobifemoral bypass limb with patent profunda superficial femoral-popliteal anterior tibial and posterior tibial arteries diminutive peroneal  6.  Patent left aortobifemoral bypass limb with occlusion of profunda femoris, 50% stenosis proximal left superficial femoral artery patent popliteal anterior tibial posterior tibial arteries with diminutive peroneal  Operative details: After obtaining informed consent, the patient was taken the PV lab.  The patient was placed in supine position the angios table.  Both groins were prepped and draped in usual sterile fashion.  Local anesthesia was infiltrated over the right common femoral artery.  Ultrasound was used to identify the right limb of the aortobifemoral bypass as well as the profunda and superficial femoral artery origins.  These were all patent.  Patient had a fairly high takeoff of the profunda which required a slightly higher stick on the right limb of the graft.  Using ultrasound guidance an introducer needle was used to cannulate the graft and an 035 versa core wire threaded up in the abdominal aorta under fluoroscopic guidance.  Next a 5 French sheath was placed over the guidewire in the right common femoral artery.  This was thoroughly flushed with heparinized saline.   5 French pigtail catheter was advanced over the guidewire in the abdominal aortogram and abdominal aortogram was then obtained in an AP lateral and oblique position to fully studied the mesenteric vessels.  The stump of the celiac axis can be visualized but there is no distal filling of any of the branches.  The superior mesenteric artery is patent at its proximal aspect.  There is a 50% narrowing just after the origin.  There is poor distal filling of distal arcades presumably due to small vessel disease.  The proximal 8 cm of the superior mesenteric artery is patent.  At this point the 5 French pigtail catheter was removed over guidewire and exchanged for a 5 French soft catheter.  This was used to selectively catheterize the superior mesenteric artery.  Direct injection of the artery again confirmed the above findings.  A pressure gradient was measured from the superior mesenteric artery past the level of the stenosis back into the abdominal aorta.  There was 0 mm gradient across this lesion.  I did not feel that an intervention on this lesion would be of benefit since there is no significant pressure gradient.  At this point the sauce catheter was used to selectively catheterize the left limb of the aortobifemoral bypass.  The guidewire was advanced distally down into the left common femoral artery.  The sauce catheter was then exchanged for a 4 French straight catheter.  Left lower extremity arteriogram was then obtained.  The left limb of the aortobifemoral bypass is patent.  The profunda is occluded.  The superficial femoral artery has what appears to be either a remote dissection or area of thrombus from the past but there is brisk flow past this.  The remainder  of superficial femoral artery is patent.  The popliteal artery anterior tibial artery and posterior tibial arteries are all patent.  The peroneal artery is diminutive.  At this point the 4 French straight catheter was removed and a right lower  extremity arteriogram was obtained through the sheath.  The right common femoral artery is patent.  The right limb of the aortobifemoral bypass is patent.  The right profunda and superficial femoral arteries are patent.  The right popliteal anterior tibial and posterior tibial arteries are patent.  The right peroneal artery is diminutive.  At this point the 5 French sheath was removed and hemostasis obtained with direct pressure.  The patient tolerated procedure well and there were no complications.  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  She is not a candidate for operative reconstruction of this due to her overall debility.  Her superior mesenteric artery is patent.  However there is probably some element of distal disease.  She is not a candidate for open reconstruction of this.  This is due to her overall debility.  She does have a 50% stenosis near the origin of this vessel but no significant pressure gradient across this which suggest that a percutaneous intervention with angioplasty or stenting would not improve her overall perfusion of this vessel.  Her inferior mesenteric artery was not visualized presumably occluded from her aortobifemoral bypass performed remotely many years ago.  As far as her lower extremities are concerned she has adequate perfusion of both lower extremities for wound healing.  No real interventions at this point to offer from a vascular surgery standpoint.  Discussed findings with pt daughter [name] at pt request.  Fabienne Bruns, MD [facility] Office: [phone] Pager: [phone]

## 2018-08-03 NOTE — Progress Notes (Signed)
Informed by blood bank that because of an antibody present in pt's blood, delivery of the PRBC's ordered this am would be delayed by 2 to 3 hours. MD made aware.  Will continue to monitor.

## 2018-08-03 NOTE — Progress Notes (Signed)
[facility] Progress Note  Day of Surgery  Subjective: CC-  Back from abdominal angiogram. 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 She was noted to have adequate perfusion of both lower extremities for wound healing.  No new complaints today. Abdominal pain about the same. Denies n/v. She reports no appetite. She has not tried to eat or drink anything today. Remains on TPN.  Objective: Vital signs in last 24 hours: Temp:  [97.8 F (36.6 C)-98.7 F (37.1 C)] 97.8 F (36.6 C) (05/04 1308) Pulse Rate:  [55-83] 75 (05/04 1308) Resp:  [7-25] 19 (05/04 1308) BP: (118-170)/(56-84) 168/65 (05/04 1308) SpO2:  [94 %-100 %] 97 % (05/04 1308) Weight:  [58.4 kg] 58.4 kg (05/04 0629) Last BM Date: 08/02/18(colostomy)  Intake/Output from previous day: 05/03 0701 - 05/04 0700 In: 2756.6 [P.O.:120; I.V.:2001.5; IV Piggyback:635.1] Out: 1801 [Urine:1800; Stool:1] Intake/Output this shift: Total I/O In: 100 [P.O.:100] Out: 550 [Urine:550]  PE: Gen:  Alert, NAD, pleasant HEENT: EOM's intact, pupils equal and round Pulm:  effort normal Skin: warm and dry Abd: Soft, NT/ND, open midline incision with fibrinous exudate at proximal portion and dehiscence of the entire wound, trace serous drainage, ostomy pink with new pouch in place     Lab Results:  Recent Labs    08/03/18 0426 08/03/18 0757  WBC 11.1* 8.7  HGB 5.8* 8.9*  HCT 19.0* 28.3*  PLT 477* 427*   BMET Recent Labs    08/01/18 0429 08/03/18 0426  NA 135 135  K 4.3 4.6  CL 108 107  CO2 20* 20*  GLUCOSE 107* 150*  BUN 16 17  CREATININE 0.45 <0.30*  CALCIUM 8.3* 8.5*   PT/INR Recent Labs    08/03/18 0426  LABPROT 15.7*  INR 1.3*   CMP     Component Value Date/Time   NA 135 08/03/2018 0426   NA 142 05/22/2018  1039   K 4.6 08/03/2018 0426   CL 107 08/03/2018 0426   CO2 20 (L) 08/03/2018 0426   GLUCOSE 150 (H) 08/03/2018 0426   BUN 17 08/03/2018 0426   BUN 10 05/22/2018 1039   CREATININE <0.30 (L) 08/03/2018 0426   CREATININE 0.72 08/07/2012 1016   CALCIUM 8.5 (L) 08/03/2018 0426   PROT 6.8 08/03/2018 0426   PROT 6.8 05/22/2018 1039   ALBUMIN 1.6 (L) 08/03/2018 0426   ALBUMIN 3.6 (L) 05/22/2018 1039   AST 199 (H) 08/03/2018 0426   ALT 164 (H) 08/03/2018 0426   ALKPHOS 257 (H) 08/03/2018 0426   BILITOT 0.5 08/03/2018 0426   BILITOT 0.3 05/22/2018 1039   GFRNONAA NOT CALCULATED 08/03/2018 0426   GFRNONAA 88 08/07/2012 1016   GFRAA NOT CALCULATED 08/03/2018 0426   GFRAA >89 08/07/2012 1016   Lipase     Component Value Date/Time   LIPASE 65 (H) 07/08/2018 1325       Studies/Results: No results found.  Anti-infectives: Anti-infectives (From admission, onward)   Start     Dose/Rate Route Frequency Ordered Stop   07/28/18 1000  fluconazole (DIFLUCAN) tablet 100 mg     100 mg Oral Daily 07/27/18 1219 08/04/18 0959   07/27/18 1400  fluconazole (DIFLUCAN) IVPB 200 mg     200 mg 100 mL/hr over 60 Minutes Intravenous  Once 07/27/18 1219 07/27/18 1426   07/15/18 1700  meropenem (MERREM) 1 g  in sodium chloride 0.9 % 100 mL IVPB     1 g 200 mL/hr over 30 Minutes Intravenous Every 8 hours 07/15/18 1620 08/14/18 2359   07/08/18 2300  vancomycin (VANCOCIN) 1,500 mg in sodium chloride 0.9 % 500 mL IVPB  Status:  Discontinued     1,500 mg 250 mL/hr over 120 Minutes Intravenous  Once 07/07/18 2128 07/07/18 2129   07/08/18 2300  vancomycin (VANCOCIN) 1,500 mg in sodium chloride 0.9 % 500 mL IVPB  Status:  Discontinued     1,500 mg 250 mL/hr over 120 Minutes Intravenous Every 24 hours 07/07/18 2129 07/08/18 0726   07/08/18 0600  ceFEPIme (MAXIPIME) 2 g in sodium chloride 0.9 % 100 mL IVPB  Status:  Discontinued     2 g 200 mL/hr over 30 Minutes Intravenous Every 8 hours 07/07/18 2124  07/13/18 1333   07/07/18 2130  vancomycin (VANCOCIN) 1,500 mg in sodium chloride 0.9 % 500 mL IVPB     1,500 mg 250 mL/hr over 120 Minutes Intravenous  Once 07/07/18 2123 07/08/18 0028   07/07/18 2115  vancomycin (VANCOCIN) IVPB 1000 mg/200 mL premix  Status:  Discontinued     1,000 mg 200 mL/hr over 60 Minutes Intravenous  Once 07/07/18 2110 07/07/18 2128   07/07/18 2115  ceFEPIme (MAXIPIME) 2 g in sodium chloride 0.9 % 100 mL IVPB     2 g 200 mL/hr over 30 Minutes Intravenous  Once 07/07/18 2110 07/07/18 2203       Assessment/Plan A. Fibonheparin gtt PVD/mesenteric ischemia - possible arteriogram by vascular prior to DC S/pright femoral thrombectomy on 3/1/2020and aortobifem 2002  HTN HLD COPD Tobacco abuse- quit smoking on admission Chronic back pain, 30-50% stenosis SMA Severe malnutrition - prealbumin11.2 (5/4) from 5.5, multiple factorial from surgery and odynophagia from tongue sores Hypomagnesemia -resolved Leukocytosis- resolved Code status DNR  Suspected chronic mesenteric ischemia Descending colon perforation on CT s/p Ex Lap, LOA, Left sigmoid colectomy, descending colostomy (Hartmann's), Dr. Derrell Lolling, 4/16POD #18 -TID WTD dressing changesto help with necrotic tissue at base of wound -cont on [facility] dietand ensure although patient continues to have poor oral intake -colostomy functioning  -mobilize and pulm toilet -attempted Cortrak placement for TFs but patient vomited a blood clot 5/1. 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 Given new information, we will defer on Cortrak placement to avoid complications given low integrity of stomach mucosa.  FEN- HH diet/ensure,TPN VTE -SCDs, heparindrip on hold due to ABL anemia ID -currently Meropenem 4/15 >>, diflucan 4/27>> Foley - Removed POD 1 Follow up -Dr. Derrell Lolling POC: [name] [phone] daughter/Cynthia Mayford Knife  [phone].  Plan: Appreciate palliative assistance with further goals of care discussions. Continue TPN for now and encourage PO intake as tolerated.    LOS: 27 days    Franne Forts , [facility] 08/03/2018, 2:37 PM Pager: [phone] Mon-Thurs 7:00 am-4:30 pm Fri 7:00 am -11:30 AM Sat-Sun 7:00 am-11:30 am

## 2018-08-03 NOTE — Progress Notes (Signed)
PHARMACY - ADULT TOTAL PARENTERAL NUTRITION CONSULT NOTE   Discontinue TPN and associated orders - RN to decrease rate to 40 ml/hr for 2 hrs then stop Change thiamine, folic acid and multivitamin from TPN to PO Discontinue SSI + CBG checks Change Protonix to PO   Loura Back, PharmD, BCPS Clinical Pharmacist Clinical phone for 08/03/2018 until 10p is [phone] 08/03/2018 4:10 PM  **Pharmacist phone directory can now be found on [email or web address] listed under [facility]**

## 2018-08-03 NOTE — Progress Notes (Signed)
PHARMACY - ADULT TOTAL PARENTERAL NUTRITION CONSULT NOTE   Pharmacy Consult for TPN Indication: Post-op ileus  Patient Measurements: Height: 5' 7"  (170.2 cm) Weight: 128 lb 12.8 oz (58.4 kg) IBW/kg (Calculated) : 61.6 TPN AdjBW (KG): 62.3 Body mass index is 20.17 kg/m.  Assessment: 60-yo female with h/o chronic N/V/abdominal pain, A. fib, PVD status post right femoral thrombectomy on 05/31/2018, hypertension and chronic back pain presenting with intractable nausea, vomiting, abdominal pain, new onset of diarrhea and about 30 pounds weight loss in the last several months.   She was admitted to [facility] with a diagnosis of sepsis. 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 Also there is a 3.9 x 1.8 cm and 2.6 x 1.1 cm fluid collection in right pelvis/groin thought to be seroma from prior surgery. IR at [facility] was consulted for stenting her SMA which has about a (30-50% stenosis) to improve mesenteric blood flow.   Patient was transferred to [facility] for further management. S/p upper endoscopy 07/14/18 found Severe ulcerative gastritis most consistent ischemia, biopsied, results pending. CT showed perforation of descending colon. 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 Now with post-op ileus in the setting of 30 pound weight loss and minimal intake since admission.  5/1 We were going to try Cortrak placement for TFs but patient vomited a blood clot yesterday.  In review of EGD 3 weeks ago, patient was noted to have severe ulcerative gastritis likely secondary to ischemia.  Given new information,  we will defer on Cortrak placement to avoid complications given low integrity of stomach mucosa. -given lack of oral intake and no plans to do long-term TNA as nutritional support, will require palliative care consult for [place] meeting.  [name], daughter, has expressed that the family does not want patient to suffer and does not want long-term artificial feeding methods 5/2 Aortagram planned for Monday 5/4  GI: Open abdominal wound. Small abscesses on CT. 2 x JP drains with 0 mls output, Stool in colostomy 5/3, Colace and Miralax; Prealbumin 5>5.5>11.2. NGT pulled out 4/17- no plan to replace at this time. Diet changed from soft back to full liquids.  Endo: No history of diabetes; CBGs <130 Insulin requirements in the past 24 hours: 0 unit Lytes: K and Phos wnl; Mg 2.1 (goal >2). Na 135. Corr Ca at 10.4 (Alb 1.6) Renal: Scr <0.3; BUN 17; UOP 1.3 ml/kg/hr Pulm: RA Cards: SBP in 130-150s; HR 70s on metoprolol and heparin drip Hepatobil:AST/ALT trending up at 199/164 and Alk Phos 257; TBili 0.5; TGs 105 Neuro: Confusion improving. Pain fairly controlled. ID: WBC 13.5>11.1; afebrile; Meropenem 4/15>> (4 weeks per ID) Heme: Hgb down to 5.8 - being transfused (likely GI source)  TPN Access: PICC requested for today (4/18) TPN start date: 4/18 Nutritional Goals (per RD recommendation on 4/30): Kcal:  2000-2200 kcals (32-36 kcal/kg bw) Protein:  112-125g Pro  (1.8-2g/kg bw) Fluid:  2-2.2 L (80m/kcal)  Goal TPN rate is 83 ml/hr (This TPN will provide 120 g of protein, 269 g of dextrose, and 67 g of lipids which provides 2003  kCals per day, meeting 100% of patient  needs)  Current Nutrition:  5/4 NPO for procedure TPN at 83 ml/hr  Plan:  Continue TPN at goal rate of 83 ml/hr  This TPN will provide 119.5 g of protein, 268.9 g of dextrose, and 67.8 g of lipids which provides 2070.4  kCals per day, meeting 100% of patient needs Electrolytes in TPN: remove Ca today, continue increased Na and Mg.  Others standard. Add MVI to TPN - trace elements are on back order, will only place in TPN on Monday, Wednesday and Friday Continue sensitive SSI and adjust as needed Add Thiamine and Folate toTPN Monitor TPN labs Mon and Thurs  F/u GOC discussions   Alanda Slim, PharmD, [facility] Clinical Pharmacist Please see AMION for all Pharmacists' Contact Phone Numbers 08/03/2018, 7:19 AM

## 2018-08-03 NOTE — Progress Notes (Signed)
PMT no charge note  Chart reviewed, weekend events noted.   Arrived on unit, patient current off the floor for her vascular angiogram study.   PMT to follow along and assist in goals of care discussions, based on her hospital course and overall disease trajectory. Ongoing discussions with daughter [name] who is well aware of the complexity of her mother's condition, remains hopeful for some stabilization/recovery if possible. We will follow along.   Rosalin Hawking MD [place] palliative medicine team [phone]

## 2018-08-03 NOTE — Progress Notes (Signed)
PT Cancellation Note  Patient Details Name: CATI UHLE MRN: [id number] DOB: 11-02-1945   Cancelled Treatment:    Reason Eval/Treat Not Completed: Active bedrest order - Pt had a procedure ending at 1147, and pt is currently on bedrest. PT to check back as schedule allows.  Nicola Police, PT Acute Rehabilitation Services Pager [phone]  Office [phone]    Tyrone Apple D Despina Hidden 08/03/2018, 3:34 PM

## 2018-08-03 NOTE — Progress Notes (Signed)
PROGRESS NOTE    [name]  GNF:[id number] DOB: January 20, 1946 DOA: 07/07/2018 PCP: Junie Spencer, [name]   Brief Narrative:  73 year old female with history of chronic N/V/abdominal pain, A. fib, PVD status post right femoral thrombectomy on 05/31/2018, hypertension and chronic back pain presented with intractable nausea, vomiting, abdominal pain, new onset of diarrhea and about 30 pounds weight loss in the last several months.  Admitted to [facility]  on 07/07/2018, -CTA abdomen and pelvis on 4/9 showed occlusion of celiac axis, or opacification of SMA and right iliac limb, patent SMV, colonic wall thickenings, rim enhancing 2.5 x 3.3 cm fluid collection around pancreatic tail continuous with a 3.1 x 1.0 cm rim-enhancing fluid collection along the anterior margin of the upper pole of left kidney. Also there is a 3.9 x 1.8 cm and 2.6 x 1.1 cm fluid collection in right pelvis/groin thought to be seroma from prior surgery.  -Patient was transferred to [facility] for further management.  Patient was also treated with cefepime for presumed pneumonia.  Gastroenterology and vascular surgery consulted.  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  She also had flexible sigmoidoscopy that showed only a 7 mm sessile polyp in the rectum but poor prep. -On 4/15, and continued to have significant leukocytosis, mild temperature and abdominal tenderness,  repeat CT abdomen and pelvis which showed frank perforation of the descending colon appears to be contained within a portion of the omentum, or occluded celiac axis, slightly decreased pancreatic tail lesion but no previously noted fluid collections.  General surgery and IR consulted. -Drain placement by IR, and ex lap, LOA, left sigmoid colectomy with descending colostomy (Hartmann's) by general surgery, Dr. Derrell Lolling on 4/16.  Abdomen left open out of concern for infectious process and ileus. Started on TPN on  07/18/2018.  Continues to have minimal oral intake.  Assessment & Plan:  Contained perforated descending colon/ chronic mesenteric ischemia -S/p IR drain placement on 4/16 which showed a large left colon defect,  -4/16 S/p Ex Lap, LOA, Left sigmoid colectomy, descending colostomy (Hartmann's), Dr. Derrell Lolling -has multiple intra abdominal abscesses -suspected to have a possible stomach perforation as well -Postop ileus-resolved, but very poor Po intake, atleast in part from ischemic stomach -open wound->Continue wet-to-dry dressings, general surgery following -Also has TNA ongoing, LFTs had worsened but appears to be improving now -Continue meropenem 4/15--per ID.   Dr. 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, celiac artery occlusion not amenable for percutaneous intervention, not a candidate for operative reconstruction due to overall debility -Updated daughter [name] that unfortunately oral intake remains very poor, ongoing failure to thrive, chances of meaningful recovery appear to be poor in light of angiogram as well, with ischemic stomach, bowel, Pallitiative following, d/w with general surgery, will discontinue TNA, will continue Palliative discussions with  consideration of residential Hospice  Paroxysmal atrial fibrillation -Currently in sinus rhythm -Held heparin due to recurrent bleed/upper GI   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 -Likely from occluded celiac axis -Continue PPI -mesenteric angiogram as above  Significant weight loss/moderate protein calorie malnutrition: -Resulting from chronic bowel ischemia -TPN as tolerated -Colon biopsy from the surgery was negative for any malignancy, biopsy from body of stomach were suggestive of intestinal metaplasia but no malignancy.  History of COPD: Stable -Continue DuoNeb as  needed   Peripheral vascular  disease with dry gangrene: Stable. -Continue home statin -was on therapeutic anticoagulation with IV heparin per pharmacy, heparin was held following recurrent upper GI bleed  Essential hypertension:  -Continue home meds  Chronic pain: -Lower narcotics and Lyrica dose due to oversedation  Tobacco use disorder: -Cessation counseling -Continue Nicotine patch. Stable  Oral thrush. -Treated with with Diflucan.  Acute toxic/metabolic encephalopathy -Resolved, due to polypharmacy -Decreased narcotics and Lyrica dose  DVT prophylaxis: hold heparin Code Status: DNR Family Communication: no Family at bedside, called and updated daughter [name] today Disposition Plan: Uncertain at this time  Consultants:   Vascular surgery  Gastroenterology  Infectious disease  Interventional radiology  General surgery  Procedures:   EGD on 4/14  Flex sigmoidoscopy on 4/14  IR drain placement on 4/16 which showed a large left colon defect, 4/16  Ex Lap, LOA, Left sigmoid colectomy, descending colostomy (Hartmann's), Dr. Derrell Lolling, 4/16  Antimicrobials: Anti-infectives (From admission, onward)   Start     Dose/Rate Route Frequency Ordered Stop   07/28/18 1000  fluconazole (DIFLUCAN) tablet 100 mg     100 mg Oral Daily 07/27/18 1219 08/04/18 0959   07/27/18 1400  fluconazole (DIFLUCAN) IVPB 200 mg     200 mg 100 mL/hr over 60 Minutes Intravenous  Once 07/27/18 1219 07/27/18 1426   07/15/18 1700  meropenem (MERREM) 1 g in sodium chloride 0.9 % 100 mL IVPB     1 g 200 mL/hr over 30 Minutes Intravenous Every 8 hours 07/15/18 1620 08/14/18 2359   07/08/18 2300  vancomycin (VANCOCIN) 1,500 mg in sodium chloride 0.9 % 500 mL IVPB  Status:  Discontinued     1,500 mg 250 mL/hr over 120 Minutes Intravenous  Once 07/07/18 2128 07/07/18 2129   07/08/18 2300  vancomycin (VANCOCIN) 1,500 mg in sodium chloride 0.9 % 500 mL IVPB  Status:  Discontinued     1,500 mg  250 mL/hr over 120 Minutes Intravenous Every 24 hours 07/07/18 2129 07/08/18 0726   07/08/18 0600  ceFEPIme (MAXIPIME) 2 g in sodium chloride 0.9 % 100 mL IVPB  Status:  Discontinued     2 g 200 mL/hr over 30 Minutes Intravenous Every 8 hours 07/07/18 2124 07/13/18 1333   07/07/18 2130  vancomycin (VANCOCIN) 1,500 mg in sodium chloride 0.9 % 500 mL IVPB     1,500 mg 250 mL/hr over 120 Minutes Intravenous  Once 07/07/18 2123 07/08/18 0028   07/07/18 2115  vancomycin (VANCOCIN) IVPB 1000 mg/200 mL premix  Status:  Discontinued     1,000 mg 200 mL/hr over 60 Minutes Intravenous  Once 07/07/18 2110 07/07/18 2128   07/07/18 2115  ceFEPIme (MAXIPIME) 2 g in sodium chloride 0.9 % 100 mL IVPB     2 g 200 mL/hr over 30 Minutes Intravenous  Once 07/07/18 2110 07/07/18 2203      Subjective: -Is okay, awaiting angiogram  Objective: Vitals:   08/03/18 1119 08/03/18 1143 08/03/18 1200 08/03/18 1308  BP: (!) 154/68 133/84 135/71 (!) 168/65  Pulse: 72  66 75  Resp: (!) 9 (!) 25 19 19   Temp:  98.2 F (36.8 C)  97.8 F (36.6 C)  TempSrc:  Oral  Oral  SpO2: 97%  100% 97%  Weight:      Height:        Intake/Output Summary (Last 24 hours) at 08/03/2018 1450 Last data filed at 08/03/2018 1309 Gross per 24 hour  Intake 2736.58 ml  Output 1250 ml  Net 1486.58 ml   Filed  Weights   08/01/18 0400 08/02/18 0609 08/03/18 0629  Weight: 61 kg 61.4 kg 58.4 kg    Examination: Gen: Frail, chronically ill-appearing female, awake alert oriented to self and partly to place HEENT: PERRLA, Neck supple, no JVD Lungs: Decreased breath sounds at both bases  CVS: S1-S2/regular rate rhythm Abd: Soft, nontender, large abdominal binder over dressing, bowel sounds present Extremities: No edema Skin: no new rashes  Data Reviewed: I have personally reviewed following labs and imaging studies  CBC: Recent Labs  Lab 07/29/18 0701 07/30/18 0313 07/30/18 2200 07/31/18 0500 08/01/18 0429 08/03/18 0426  08/03/18 0757  WBC 15.1* 13.5* 10.9* 11.5* 11.1* 11.1* 8.7  NEUTROABS 8.4* 7.6  --  6.4  --  5.7 4.7  HGB 7.1* 7.1* 8.2* 8.0* 8.0* 5.8* 8.9*  HCT 23.3* 23.4* 26.4* 25.5* 26.1* 19.0* 28.3*  MCV 80.3 80.4 80.7 80.4 82.6 83.7 82.5  PLT 387 403* 386 389 390 477* 427*   Basic Metabolic Panel: Recent Labs  Lab 07/29/18 1016 07/30/18 0313 07/31/18 0500 08/01/18 0429 08/03/18 0426  NA 135 134* 135 135 135  K 4.2 4.3 4.2 4.3 4.6  CL 105 106 105 108 107  CO2 21* 22 20* 20* 20*  GLUCOSE 139* 124* 119* 107* 150*  BUN CREATININE 0.36* 0.39* 0.47 0.45 <0.30*  CALCIUM 8.3* 8.3* 8.5* 8.3* 8.5*  MG  --  1.6* 1.9 2.0 2.1  PHOS  --  3.9  --   --  4.3   GFR: CrCl cannot be calculated (This lab value cannot be used to calculate CrCl because it is not a number: <0.30). Liver Function Tests: Recent Labs  Lab 07/28/18 0725 07/28/18 1820 07/29/18 1016 07/30/18 0313 08/03/18 0426  AST 248* 170* 104* 85* 199*  ALT 150* 125* 90* 79* 164*  ALKPHOS 290* 262* 269* 252* 257*  BILITOT 0.5 0.5 0.5 0.3 0.5  PROT 5.9* 6.3* 6.2* 6.2* 6.8  ALBUMIN 1.3* 1.4* 1.3* 1.4* 1.6*   No results for input(s): LIPASE, AMYLASE in the last 168 hours. Recent Labs  Lab 07/28/18 1820  AMMONIA 31   Coagulation Profile: Recent Labs  Lab 07/28/18 1820 08/03/18 0426  INR 1.1 1.3*   Cardiac Enzymes: No results for input(s): CKTOTAL, CKMB, CKMBINDEX, TROPONINI in the last 168 hours. BNP (last 3 results) No results for input(s): PROBNP in the last 8760 hours. HbA1C: No results for input(s): HGBA1C in the last 72 hours. CBG: Recent Labs  Lab 08/02/18 2004 08/03/18 0014 08/03/18 0409 08/03/18 0812 08/03/18 1144  GLUCAP 123* 106* 114* 113* 129*   Lipid Profile: Recent Labs    08/03/18 0426  TRIG 105   Thyroid Function Tests: No results for input(s): TSH, T4TOTAL, FREET4, T3FREE, THYROIDAB in the last 72 hours. Anemia Panel: No results for input(s): VITAMINB12, FOLATE, FERRITIN,  TIBC, IRON, RETICCTPCT in the last 72 hours. Sepsis Labs: No results for input(s): PROCALCITON, LATICACIDVEN in the last 168 hours.  No results found for this or any previous visit (from the past 240 hour(s)).   Radiology Studies: No results found.  Scheduled Meds: . sodium chloride   Intravenous Once  . Chlorhexidine Gluconate Cloth  6 each Topical Q0600  . docusate sodium  100 mg Oral BID  . feeding supplement (ENSURE ENLIVE)  237 mL Oral TID BM  . fluconazole  100 mg Oral Daily  . insulin aspart  0-9 Units Subcutaneous Q4H  . lidocaine  1 patch Transdermal Q24H  . magic mouthwash  10 mL Oral QID  . metoprolol tartrate  75 mg Oral BID  . nicotine  21 mg Transdermal Daily  . pantoprazole (PROTONIX) IV  40 mg Intravenous Q24H  . pregabalin  50 mg Oral BID  . saccharomyces boulardii  250 mg Oral BID  . sodium chloride flush  10-40 mL Intracatheter Q12H  . sodium chloride flush  3 mL Intravenous Q12H   Continuous Infusions: . sodium chloride 10 mL/hr at 07/22/18 2300  . sodium chloride 100 mL/hr at 08/03/18 1151  . sodium chloride    . meropenem (MERREM) IV 1 g (08/03/18 1423)  . methocarbamol (ROBAXIN) IV    . TPN ADULT (ION) 83 mL/hr at 08/02/18 1800  . TPN ADULT (ION)       LOS: 27 days   Zannie CovePreetha Frankie Scipio, MD [facility]  08/03/2018, 2:50 PM

## 2018-08-03 NOTE — Progress Notes (Signed)
CRITICAL VALUE ALERT  Critical Value: Hemoglobin 5.8  Date & Time Notied:  08/03/18, 0540  Provider Notified: Boudenhimer  Orders Received/Actions taken: Yes

## 2018-08-03 NOTE — Care Management Important Message (Signed)
Important Message  Patient Details  Name: [name] MRN: [id number] Date of Birth: 31-Oct-1945   Medicare Important Message Given:  Yes    Dorena Bodo 08/03/2018, 3:48 PM

## 2018-08-04 LAB — GLUCOSE, CAPILLARY: Glucose-Capillary: 114 mg/dL — ABNORMAL HIGH (ref 70–99)

## 2018-08-04 MED FILL — Lidocaine HCl Local Preservative Free (PF) Inj 1%: INTRAMUSCULAR | Qty: 30 | Status: AC

## 2018-08-04 NOTE — Progress Notes (Signed)
Physical Therapy Treatment Patient Details Name: [name] MRN: [id number] DOB: 02/22/1946 Today's Date: 08/04/2018    History of Present Illness  [name] is a 73 y.o. female with medical history significant of chronic back pain, GERD, hyperlipidemia, hypertension, left knee osteoarthritis, palpitations, PVD h/o Aortofem bypass, s/p RLE thromboembolectomy and R common emoral artery patch angioplasty 05/31/18, history of tobacco use who presents to ER due to fall at home.  patient now s/p EXPLORATORY LAPAROTOMY, lysis of adhesions X 45 minutes LEFT SIGMOID COLECTOMY, SPLENIC FLEXOR MOBILIZATION,DESCENDING COLOSTOMY on 4/16.    PT Comments    Pt not willing to participate in OOB mobility today due to nausea. Pt appears to have depressed spirits and decreased will to help herself and get better. Pt educated extensively on importance of OOB mobility however cont to defer. Encouraged pt to get up for lunch today. Notified RN. Acute PT to cont to follow.  Follow Up Recommendations  SNF     Equipment Recommendations  (TBD at next venue)    Recommendations for Other Services       Precautions / Restrictions Precautions Precautions: Fall Restrictions Weight Bearing Restrictions: No    Mobility  Bed Mobility Overal bed mobility: Needs Assistance Bed Mobility: Rolling;Sidelying to Sit;Sit to Sidelying Rolling: Supervision Sidelying to sit: Min guard     Sit to sidelying: Modified independent (Device/Increase time)(impulsive) General bed mobility comments: Pt able to roll to the R and bring self to sitting without physical assist, pt impulsively laid back down without any physical assist  Transfers                 General transfer comment: pt adamantly refused and OOB mobility despite education on importance. stating "I just dont feel like it, I'm nauseated and I just want to lay down, be still and get a good nap." Asked RN to see if patient had nausea medicine on  board  Ambulation/Gait             General Gait Details: refused   Stairs             Wheelchair Mobility    Modified Rankin (Stroke Patients Only)       Balance Overall balance assessment: Needs assistance Sitting-balance support: Feet supported;No upper extremity supported Sitting balance-Leahy Scale: Fair Sitting balance - Comments: sat EOB x 3 min with max encouragement prior to impulsively lying down                                    Cognition Arousal/Alertness: Awake/alert Behavior During Therapy: Flat affect Overall Cognitive Status: Impaired/Different from baseline Area of Impairment: Safety/judgement;Awareness                         Safety/Judgement: Decreased awareness of deficits(decreased insight of importance of mobility)     General Comments: pt not motivated, only wants to lay still and not move, educated on importance of mobility however pt cont to defer mobility      Exercises      General Comments General comments (skin integrity, edema, etc.): VSS      Pertinent Vitals/Pain Pain Assessment: 0-10 Pain Score: 2  Pain Location: stomach, c/o nausea Pain Descriptors / Indicators: (nausea) Pain Intervention(s): Monitored during session    Home Living  Prior Function            PT Goals (current goals can now be found in the care plan section) Acute Rehab PT Goals Patient Stated Goal: stop the nausea Progress towards PT goals: Progressing toward goals    Frequency    Min 2X/week      PT Plan Current plan remains appropriate;Frequency needs to be updated    Co-evaluation              AM-PAC PT "6 Clicks" Mobility   Outcome Measure  Help needed turning from your back to your side while in a flat bed without using bedrails?: None Help needed moving from lying on your back to sitting on the side of a flat bed without using bedrails?: None Help needed moving to  and from a bed to a chair (including a wheelchair)?: A Little Help needed standing up from a chair using your arms (e.g., wheelchair or bedside chair)?: A Little Help needed to walk in hospital room?: A Little Help needed climbing 3-5 steps with a railing? : A Lot 6 Click Score: 19    End of Session Equipment Utilized During Treatment: Gait belt Activity Tolerance: Patient limited by fatigue Patient left: in bed;with call bell/phone within reach Nurse Communication: Mobility status PT Visit Diagnosis: Unsteadiness on feet (R26.81);Other abnormalities of gait and mobility (R26.89);Difficulty in walking, not elsewhere classified (R26.2)     Time: [id number] PT Time Calculation (min) (ACUTE ONLY): 14 min  Charges:  $Therapeutic Activity: 8-22 mins                     Lewis ShockAshly Othel Hoogendoorn, PT, DPT Acute Rehabilitation Services Pager #: [phone] Office #: [phone]    Iona Hansenshly M Elester Apodaca 08/04/2018, 10:21 AM

## 2018-08-04 NOTE — Progress Notes (Signed)
Daily Progress Note   Patient Name: [name]       Date: 08/04/2018 DOB: May 25, 1945  Age: 73 y.o. MRN#: [id number] Attending Physician: Zannie Cove, MD Primary Care Physician: Junie Spencer, [name] Admit Date: 07/07/2018  Reason for Consultation/Follow-up: Establishing goals of care  Subjective: Patient is sleeping on entering room.  She awakens but reports that she is tired and not up to talking today,  I called and discussed with her daughter, [name].    Length of Stay: 28  Current Medications: Scheduled Meds:  . sodium chloride   Intravenous Once  . Chlorhexidine Gluconate Cloth  6 each Topical Q0600  . docusate sodium  100 mg Oral BID  . feeding supplement (ENSURE ENLIVE)  237 mL Oral TID BM  . folic acid  1 mg Oral Daily  . lidocaine  1 patch Transdermal Q24H  . magic mouthwash  10 mL Oral QID  . metoprolol tartrate  75 mg Oral BID  . multivitamin with minerals  1 tablet Oral Daily  . nicotine  21 mg Transdermal Daily  . pantoprazole  40 mg Oral Daily  . pregabalin  50 mg Oral BID  . saccharomyces boulardii  250 mg Oral BID  . sodium chloride flush  10-40 mL Intracatheter Q12H  . sodium chloride flush  3 mL Intravenous Q12H  . thiamine  100 mg Oral Daily    Continuous Infusions: . sodium chloride 10 mL/hr at 07/22/18 2300  . sodium chloride    . meropenem (MERREM) IV Stopped (08/04/18 1430)  . methocarbamol (ROBAXIN) IV      PRN Meds: sodium chloride, sodium chloride, acetaminophen, alum & mag hydroxide-simeth, hydrALAZINE, ipratropium-albuterol, labetalol, methocarbamol (ROBAXIN) IV, morphine injection, ondansetron (ZOFRAN) IV, oxyCODONE, phenol, promethazine, sodium chloride flush, sodium chloride flush  Physical Exam         NAD Sleeping but awakens  easily Has colostomy Trace edema Non focal Regular S1 S 2  Vital Signs: BP 136/73 (BP Location: Left Arm)   Pulse 76   Temp 98.6 F (37 C) (Oral)   Resp 19   Ht  (1.702 m)   Wt 60.6 kg Comment: re-weighed on a zeroed out standing scale RN aware  SpO2 96%   BMI 20.91 kg/m  SpO2: SpO2: 96 % O2 Device: O2 Device: Room [id number]  O2 Flow Rate: O2 Flow Rate (L/min): 2 L/min  Intake/output summary:   Intake/Output Summary (Last 24 hours) at 08/04/2018 2239 Last data filed at 08/04/2018 1500 Gross per 24 hour  Intake 640 ml  Output 1300 ml  Net -660 ml   LBM: Last BM Date: (colostomy) Baseline Weight: Weight: 74.8 kg Most recent weight: Weight: 60.6 kg(re-weighed on a zeroed out standing scale RN aware)       Palliative Assessment/Data:    Flowsheet Rows     Most Recent Value  Intake Tab  Referral Department  Hospitalist  Unit at Time of Referral  Cardiac/Telemetry Unit  Date Notified  07/30/18  Palliative Care Type  New Palliative care  Reason for referral  Clarify Goals of Care  Date of Admission  07/07/18  Date first seen by Palliative Care  07/31/18  # of days Palliative referral response time  1 Day(s)  # of days IP prior to Palliative referral  23  Clinical Assessment  Psychosocial & Spiritual Assessment  Palliative Care Outcomes      Patient Active Problem List   Diagnosis Date Noted  . Palliative care by specialist   . Goals of care, counseling/discussion   . Bowel perforation (HCC) 07/16/2018  . Lower abdominal pain   . Abnormal CT scan, colon   . Abnormal liver function   . Loss of weight   . Nausea without vomiting   . Sepsis (HCC) 07/07/2018  . Severe protein-calorie malnutrition (HCC) 07/07/2018  . Contusion of right hip   . COPD with acute exacerbation (HCC)   . General weakness 05/28/2018  . Fall 05/28/2018  . UTI (urinary tract infection) 05/28/2018  . Pancreatitis 05/28/2018  . Lumbar radiculopathy 07/30/2016  . Depression 07/24/2015  .  S/P lumbar fusion 01/27/2015  . GAD (generalized anxiety disorder) 06/13/2014  . Neck pain 07/14/2012  . S/P lumbar spinal fusion 07/14/2012  . Lumbar stenosis with neurogenic claudication 06/11/2012  . DDD (degenerative disc disease), lumbosacral 04/24/2012  . Spondylolisthesis at L5-S1 level 04/24/2012  . Hyperlipidemia with target LDL less than 100 06/16/2009  . TOBACCO ABUSE 06/16/2009  . Essential hypertension 06/16/2009  . Peripheral vascular disease (HCC) 06/16/2009  . GERD 06/16/2009  . Chronic back pain 06/16/2009  . CAROTID BRUIT 05/23/2009    Palliative Care Assessment & Plan   Patient Profile:    Assessment:  73 yo lady who has suspected chronic mesenteric ischemia, descending colon perforation, A fib, PVD/ mesenteric ischemia. 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  Remains on TNA Cortrak not able to be placed and enteric feeds unable to be initiated due to concern for ulcerative gastritis with concern for ischemia.   Recommendations/Plan: Patient reports too tired to talk today.  Discussed extensively with the patient's daughter [name] over the phone:   We discussed clinical course this admission as well as wishes for care this hospitalization discussed.  We discussed difference between a aggressive medical intervention path and a palliative, comfort focused care path.  Values and goals of care important to patient and family were attempted to be elicited.  Plan for follow-up meeting tomorrow around 10AM with patient and daughter via phone.    Code Status:    Code Status Orders  (From admission, onward)         Start     Ordered   07/30/18 1357  Do not attempt resuscitation (DNR)  Continuous    Question Answer Comment  In the event  of cardiac or respiratory ARREST Do not call a "code blue"   In the event of cardiac or respiratory ARREST Do not perform Intubation, CPR, defibrillation or ACLS   In the event of  cardiac or respiratory ARREST Use medication by any route, position, wound care, and other measures to relive pain and suffering. May use oxygen, suction and manual treatment of airway obstruction as needed for comfort.      07/30/18 1357        Code Status History    Date Active Date Inactive Code Status Order ID Comments User Context   07/07/2018 2335 07/30/2018 1357 Full Code [id number]  Pearson Grippe, MD ED   05/28/2018 0119 06/08/2018 1936 Full Code [id number]  Bobette Mo, MD ED       Prognosis:   Unable to determine  Discharge Planning:  To Be Determined  Care plan was discussed with  Patient, patient's daughter [name] on the phone.   Thank you for allowing the Palliative Medicine Team to assist in the care of this patient.   Time In: 1700 Time Out: 1740 Total Time 40 Prolonged Time Billed  no       Greater than 50%  of this time was spent counseling and coordinating care related to the above assessment and plan.  Romie Minus, MD Please contact Palliative Medicine Team phone at [phone] for questions and concerns.

## 2018-08-04 NOTE — Progress Notes (Signed)
PROGRESS NOTE    Danielle FilbertLinda S Grant  WUJ:[id number] DOB: [birth year] DOA: 07/07/2018 PCP: [name], [name], [name]   Brief Narrative:  73 year old female with history of chronic N/V/abdominal pain, A. fib, PVD,hypertension and chronic back pain presented with intractable nausea, vomiting, abdominal pain, new onset of diarrhea and about 30 pounds weight loss in the last several months.  [facility], -CTA abdomen and pelvis on 4/9 showed occlusion of celiac axis, or opacification of SMA and right iliac limb, patent SMV, colonic wall thickenings, multiple fluid collections. -[facility]  Patient was also treated with cefepime for presumed pneumonia.  Gastroenterology and vascular surgery consulted.  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  She also had flexible sigmoidoscopy that showed only a 7 mm sessile polyp in the rectum but poor prep. -On 4/15-worsened clinically, repeat CT abdomen and pelvis which showed frank perforation of the descending colon appears to be contained within a portion of the omentum - General surgery and IR consulted. -Drain placement by IR, and ex lap, LOA, left sigmoid colectomy with descending colostomy (Hartmann's) by general surgery, Dr. Derrell Lollingamirez on 4/16.  Abdomen left open out of concern for infectious process and ileus. Started on TPN on 07/18/2018.   -Continue to have minimal oral intake and failure to thrive -Palliative care consulted and following, family understands poor prognosis -5/4 underwent mesenteric angiogram: Noted chronic celiac artery occlusion with no options for revascularization -Palliative care discussions ongoing  Assessment & Plan:  Contained perforated descending colon/ chronic mesenteric ischemia -S/p IR drain placement on 4/16 which showed a large left colon defect,  -4/16 S/p Ex Lap, LOA, Left sigmoid colectomy,  descending colostomy (Hartmann's), Dr. Derrell Lollingamirez -has multiple intra abdominal abscesses -suspected to have a possible stomach perforation as well -Postop ileus-resolved, but very poor Po intake, atleast in part from ischemic stomach -open wound->Continue wet-to-dry dressings, general surgery following -Supported with TNA since 4/18 -Continue meropenem 4/15--per ID.   Dr. 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  - from occluded celiac axis -Continue PPI -mesenteric angiogram as above  Significant weight loss/moderate protein calorie malnutrition: -Resulting from chronic bowel ischemia -Was on TPN for 2 weeks, discontinued 5/4  History of COPD: Stable -Continue DuoNeb as needed   Peripheral vascular disease with dry gangrene: Stable. -Continue home statin -was on therapeutic anticoagulation with IV heparin per pharmacy, heparin was held following recurrent upper GI bleed  Essential hypertension:  -Continue home meds  Chronic pain: -Lower narcotics and Lyrica dose due to oversedation  Tobacco use disorder: -Cessation counseling -Continue Nicotine  patch. Stable  Oral thrush. -Treated with with Diflucan.  Acute toxic/metabolic encephalopathy -Resolved, due to polypharmacy -Decreased narcotics and Lyrica dose  DVT prophylaxis: hold heparin  Code Status: DNR Family Communication: no Family at bedside, called and updated daughter [name] 5/4 Disposition Plan: Possibly residential hospice  Consultants:   Vascular surgery  Gastroenterology  Infectious disease  Interventional radiology  General surgery  Procedures:   EGD on 4/14  Flex sigmoidoscopy on 4/14  IR drain placement on 4/16 which showed a large left colon defect, 4/16  Ex Lap, LOA, Left sigmoid colectomy, descending colostomy (Hartmann's), Dr. Derrell Lolling, 4/16  Antimicrobials: Anti-infectives (From admission, onward)   Start     Dose/Rate Route Frequency Ordered Stop   07/28/18 1000  fluconazole (DIFLUCAN) tablet 100 mg     100 mg Oral Daily 07/27/18 1219 08/04/18 0959   07/27/18 1400  fluconazole (DIFLUCAN) IVPB 200 mg     200 mg 100 mL/hr over 60 Minutes Intravenous  Once 07/27/18 1219 07/27/18 1426   07/15/18 1700  meropenem (MERREM) 1 g in sodium chloride 0.9 % 100 mL IVPB     1 g 200 mL/hr over 30 Minutes Intravenous Every 8 hours 07/15/18 1620 08/14/18 2359   07/08/18 2300  vancomycin (VANCOCIN) 1,500 mg in sodium chloride 0.9 % 500 mL IVPB  Status:  Discontinued     1,500 mg 250 mL/hr over 120 Minutes Intravenous  Once 07/07/18 2128 07/07/18 2129   07/08/18 2300  vancomycin (VANCOCIN) 1,500 mg in sodium chloride 0.9 % 500 mL IVPB  Status:  Discontinued     1,500 mg 250 mL/hr over 120 Minutes Intravenous Every 24 hours 07/07/18 2129 07/08/18 0726   07/08/18 0600  ceFEPIme (MAXIPIME) 2 g in sodium chloride 0.9 % 100 mL IVPB  Status:  Discontinued     2 g 200 mL/hr over 30 Minutes Intravenous Every 8 hours 07/07/18 2124 07/13/18 1333   07/07/18 2130  vancomycin (VANCOCIN) 1,500 mg in sodium chloride 0.9 % 500 mL IVPB     1,500 mg 250 mL/hr over 120  Minutes Intravenous  Once 07/07/18 2123 07/08/18 0028   07/07/18 2115  vancomycin (VANCOCIN) IVPB 1000 mg/200 mL premix  Status:  Discontinued     1,000 mg 200 mL/hr over 60 Minutes Intravenous  Once 07/07/18 2110 07/07/18 2128   07/07/18 2115  ceFEPIme (MAXIPIME) 2 g in sodium chloride 0.9 % 100 mL IVPB     2 g 200 mL/hr over 30 Minutes Intravenous  Once 07/07/18 2110 07/07/18 2203      Subjective: -Denies significant complaints, mild abdominal discomfort, oral intake very poor  Objective: Vitals:   08/04/18 0559 08/04/18 0622 08/04/18 1017 08/04/18 1334  BP: (!) 134/52  125/65 137/64  Pulse: 68  70 71  Resp: 17 19  15   Temp: 98.3 F (36.8 C)   98.5 F (36.9 C)  TempSrc: Oral   Oral  SpO2: 96%   96%  Weight:  60.6 kg    Height:        Intake/Output Summary (Last 24 hours) at 08/04/2018 1445 Last data filed at 08/04/2018 0800 Gross per 24 hour  Intake 2836.48 ml  Output 1200 ml  Net 1636.48 ml   Filed Weights   08/03/18 0629 08/04/18 0545 08/04/18 0622  Weight: 58.4 kg 61.8 kg 60.6 kg    Examination: Gen: Frail chronically ill female, awake alert oriented to self and partly to place only HEENT: PERRLA, Neck supple, no JVD Lungs: Decreased breath sounds at bases CVS: S1-S2/regular rate rhythm Abd: Soft, nontender, large abdominal binder over dressing, bowel sounds present Extremities: No edema Skin: no new rashes Psychiatry: Poor insight and judgment  Data Reviewed: I have personally reviewed following labs and imaging studies  CBC: Recent Labs  Lab 07/29/18 0701 07/30/18 0313 07/30/18 2200 07/31/18 0500 08/01/18 0429 08/03/18 0426 08/03/18 0757  WBC 15.1* 13.5* 10.9* 11.5* 11.1* 11.1* 8.7  NEUTROABS 8.4* 7.6  --  6.4  --  5.7 4.7  HGB 7.1* 7.1* 8.2* 8.0* 8.0* 5.8* 8.9*  HCT 23.3* 23.4* 26.4* 25.5* 26.1* 19.0* 28.3*  MCV 80.3 80.4 80.7 80.4 82.6 83.7 82.5  PLT 387 403* 386 389 390 477* 427*   Basic Metabolic Panel: Recent Labs  Lab 07/29/18 1016  07/30/18 0313 07/31/18 0500 08/01/18 0429 08/03/18 0426  NA 135 134* 135 135 135  K 4.2 4.3 4.2 4.3 4.6  CL 105 106 105 108 107  CO2 21* 22 20* 20* 20*  GLUCOSE 139* 124* 119* 107* 150*  BUN CREATININE 0.36* 0.39* 0.47 0.45 <0.30*  CALCIUM 8.3* 8.3* 8.5* 8.3* 8.5*  MG  --  1.6* 1.9 2.0 2.1  PHOS  --  3.9  --   --  4.3   GFR: CrCl cannot be calculated (This lab value cannot be used to calculate CrCl because it is not a number: <0.30). Liver Function Tests: Recent Labs  Lab 07/28/18 1820 07/29/18 1016 07/30/18 0313 08/03/18 0426  AST 170* 104* 85* 199*  ALT 125* 90* 79* 164*  ALKPHOS 262* 269* 252* 257*  BILITOT 0.5 0.5 0.3 0.5  PROT 6.3* 6.2* 6.2* 6.8  ALBUMIN 1.4* 1.3* 1.4* 1.6*   No results for input(s): LIPASE, AMYLASE in the last 168 hours. Recent Labs  Lab 07/28/18 1820  AMMONIA 31   Coagulation Profile: Recent Labs  Lab 07/28/18 1820 08/03/18 0426  INR 1.1 1.3*   Cardiac Enzymes: No results for input(s): CKTOTAL, CKMB, CKMBINDEX, TROPONINI in the last 168 hours. BNP (last 3 results) No results for input(s): PROBNP in the last 8760 hours. HbA1C: No results for input(s): HGBA1C in the last 72 hours. CBG: Recent Labs  Lab 08/03/18 0812 08/03/18 1144 08/03/18 1606 08/03/18 2121 08/04/18 0624  GLUCAP 113* 129* 117* 100* 114*   Lipid Profile: Recent Labs    08/03/18 0426  TRIG 105   Thyroid Function Tests: No results for input(s): TSH, T4TOTAL, FREET4, T3FREE, THYROIDAB in the last 72 hours. Anemia Panel: No results for input(s): VITAMINB12, FOLATE, FERRITIN, TIBC, IRON, RETICCTPCT in the last 72 hours. Sepsis Labs: No results for input(s): PROCALCITON, LATICACIDVEN in the last 168 hours.  No results found for this or any previous visit (from the past 240 hour(s)).   Radiology Studies: No results found.  Scheduled Meds: . sodium chloride   Intravenous Once  . Chlorhexidine Gluconate Cloth  6 each Topical Q0600  .  docusate sodium  100 mg Oral BID  . feeding supplement (ENSURE ENLIVE)  237 mL Oral TID BM  . folic acid  1 mg Oral Daily  . lidocaine  1 patch Transdermal Q24H  . magic mouthwash  10 mL Oral QID  . metoprolol tartrate  75 mg Oral BID  . multivitamin with minerals  1 tablet Oral Daily  . nicotine  21 mg Transdermal Daily  . pantoprazole  40 mg Oral Daily  . pregabalin  50 mg Oral BID  . saccharomyces boulardii  250 mg Oral BID  . sodium chloride flush  10-40 mL Intracatheter Q12H  . sodium chloride flush  3 mL Intravenous Q12H  . thiamine  100 mg Oral Daily   Continuous Infusions: .  sodium chloride 10 mL/hr at 07/22/18 2300  . sodium chloride    . meropenem (MERREM) IV 1 g (08/04/18 1400)  . methocarbamol (ROBAXIN) IV       LOS: 28 days   Zannie Cove, MD [facility]  08/04/2018, 2:45 PM

## 2018-08-04 NOTE — Progress Notes (Signed)
Encouraged patient to get OOB today, patient declines with several encouragements through out day. Patient stated "she is tired" and just wanted to rest. Will monitor patient .Zollie Clemence, Randall An RN

## 2018-08-04 NOTE — Progress Notes (Addendum)
1 Day Post-Op  Subjective: CC -  TPN discontinued overnight. Patient without any new complaints. Mild abdominal pain. Minimal intake and no appetite. Denies N/V. Some flatus out of colostomy.  Last BM recorded as today  Objective: Vital signs in last 24 hours: Temp:  [97.8 F (36.6 C)-98.3 F (36.8 C)] 98.3 F (36.8 C) (05/05 0559) Pulse Rate:  [55-83] 68 (05/05 0559) Resp:  [7-25] 19 (05/05 0622) BP: (118-175)/(52-84) 134/52 (05/05 0559) SpO2:  [96 %-100 %] 96 % (05/05 0559) Weight:  [60.6 kg-61.8 kg] 60.6 kg (05/05 0622) Last BM Date: 08/04/18(colostomy)   Intake/Output from previous day: 05/04 0701 - 05/05 0700 In: 2836.5 [P.O.:250; I.V.:2006.4; IV Piggyback:580.1] Out: 1750 [Urine:1750] Intake/Output this shift: No intake/output data recorded.   PE: Gen:  Alert, NAD, pleasant HEENT: EOM's intact, pupils equal and round Pulm:  effort normal Skin: warm and dry Abd: Soft, NT/ND,abd binder in place. Open midline incision with fibrinous exudate at proximal portion and dehiscence of the entire wound, trace serous drainage, ostomy pink with a small area of clotted cephalic. Small amount of blood in bad as seen in picture. No active bleeding. Air in bag. No stool. See picture below.       Lab Results:  Recent Labs    08/03/18 0426 08/03/18 0757  WBC 11.1* 8.7  HGB 5.8* 8.9*  HCT 19.0* 28.3*  PLT 477* 427*   BMET Recent Labs    08/03/18 0426  NA 135  K 4.6  CL 107  CO2 20*  GLUCOSE 150*  BUN 17  CREATININE <0.30*  CALCIUM 8.5*   PT/INR Recent Labs    08/03/18 0426  LABPROT 15.7*  INR 1.3*   CMP     Component Value Date/Time   NA 135 08/03/2018 0426   NA 142 05/22/2018 1039   K 4.6 08/03/2018 0426   CL 107 08/03/2018 0426   CO2 20 (L) 08/03/2018 0426   GLUCOSE 150 (H) 08/03/2018 0426   BUN 17 08/03/2018 0426   BUN 10 05/22/2018 1039   CREATININE <0.30 (L) 08/03/2018 0426   CREATININE 0.72 08/07/2012 1016   CALCIUM 8.5 (L) 08/03/2018  0426   PROT 6.8 08/03/2018 0426   PROT 6.8 05/22/2018 1039   ALBUMIN 1.6 (L) 08/03/2018 0426   ALBUMIN 3.6 (L) 05/22/2018 1039   AST 199 (H) 08/03/2018 0426   ALT 164 (H) 08/03/2018 0426   ALKPHOS 257 (H) 08/03/2018 0426   BILITOT 0.5 08/03/2018 0426   BILITOT 0.3 05/22/2018 1039   GFRNONAA NOT CALCULATED 08/03/2018 0426   GFRNONAA 88 08/07/2012 1016   GFRAA NOT CALCULATED 08/03/2018 0426   GFRAA >89 08/07/2012 1016   Lipase     Component Value Date/Time   LIPASE 65 (H) 07/08/2018 1325       Studies/Results: No results found.  Anti-infectives: Anti-infectives (From admission, onward)   Start     Dose/Rate Route Frequency Ordered Stop   07/28/18 1000  fluconazole (DIFLUCAN) tablet 100 mg     100 mg Oral Daily 07/27/18 1219 08/04/18 0959   07/27/18 1400  fluconazole (DIFLUCAN) IVPB 200 mg     200 mg 100 mL/hr over 60 Minutes Intravenous  Once 07/27/18 1219 07/27/18 1426   07/15/18 1700  meropenem (MERREM) 1 g in sodium chloride 0.9 % 100 mL IVPB     1 g 200 mL/hr over 30 Minutes Intravenous Every 8 hours 07/15/18 1620 08/14/18 2359   07/08/18 2300  vancomycin (VANCOCIN) 1,500 mg in sodium chloride 0.9 %  500 mL IVPB  Status:  Discontinued     1,500 mg 250 mL/hr over 120 Minutes Intravenous  Once 07/07/18 2128 07/07/18 2129   07/08/18 2300  vancomycin (VANCOCIN) 1,500 mg in sodium chloride 0.9 % 500 mL IVPB  Status:  Discontinued     1,500 mg 250 mL/hr over 120 Minutes Intravenous Every 24 hours 07/07/18 2129 07/08/18 0726   07/08/18 0600  ceFEPIme (MAXIPIME) 2 g in sodium chloride 0.9 % 100 mL IVPB  Status:  Discontinued     2 g 200 mL/hr over 30 Minutes Intravenous Every 8 hours 07/07/18 2124 07/13/18 1333   07/07/18 2130  vancomycin (VANCOCIN) 1,500 mg in sodium chloride 0.9 % 500 mL IVPB     1,500 mg 250 mL/hr over 120 Minutes Intravenous  Once 07/07/18 2123 07/08/18 0028   07/07/18 2115  vancomycin (VANCOCIN) IVPB 1000 mg/200 mL premix  Status:  Discontinued      1,000 mg 200 mL/hr over 60 Minutes Intravenous  Once 07/07/18 2110 07/07/18 2128   07/07/18 2115  ceFEPIme (MAXIPIME) 2 g in sodium chloride 0.9 % 100 mL IVPB     2 g 200 mL/hr over 30 Minutes Intravenous  Once 07/07/18 2110 07/07/18 2203       Assessment/Plan A. 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 8.7 on 5/4 Anemia - Hgb 5.8 yesterday. 8.9 after PRBC  Code status DNR  Suspected chronic mesenteric ischemia Descending colon perforation on CT s/p Ex Lap, LOA, Left sigmoid colectomy, descending colostomy (Hartmann's), Dr. Derrell Lollingamirez, 4/16POD #19 -TID WTD dressing changesto help with necrotic tissue at base of wound -[facility] diet[facility] ensure although patient continues to have poor oral intake. TPN d/c'd 5/4 -colostomy functioning. Last BM recorded as today. Gas in bag. Small amount of clotted blood cephalic. No active bleeding. Monitor.  -mobilize and pulm toilet -attempted Cortrak placement for TFs but patient vomited a blood clot 5/1. 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 Given new information, we will defer on Cortrak placement to avoid complications given low integrity of stomach mucosa. Pt underwent aortogram yesterday by vascular. 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 TPN d/c yesterday. Patient has poor intake. Unfortunately, has not been progressing well since surgery and does not have a reliable long term source of nutrition. No good surgical  options at this time to improve her status. Appreciate palliative team discussion regarding goals of care with family.   FEN- HH diet/ensure VTE -SCDs, heparindrip on hold due to ABL anemia ID -currently Meropenem 4/15 per ID >>, diflucan 4/27>> Foley - Removed POD 1 Follow up -Dr. Derrell Lollingamirez POC: Dellis Filbertammy Kallam [phone] daughter/[name] [phone].  Plan: Appreciate palliative assistance with further goals of care discussions.    LOS: 28 days    Jacinto HalimMichael M Akiera Allbaugh , [facility] 08/04/2018, 9:57 AM Pager: [phone]

## 2018-08-05 NOTE — Progress Notes (Signed)
PROGRESS NOTE    [name]  ZOX:[id number] DOB: 1945-09-08 DOA: 07/07/2018 PCP: [name], [name], [name]    Brief Narrative:  73 year old female with history of chronic N/V/abdominal pain, A. fib, PVD,hypertension and chronic back pain presented with intractable nausea, vomiting, abdominal pain, new onset of diarrhea and about 30 pounds weight loss in the last several months.  [facility], -CTA abdomen and pelvis on 4/9 showed occlusion of celiac axis, or opacification of SMA and right iliac limb, patent SMV, colonic wall thickenings, multiple fluid collections. -[facility] Patient was also treated with cefepime for presumed pneumonia. Gastroenterology and vascular surgery consulted. 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 She also had flexible sigmoidoscopy that showed only a 7 mm sessile polyp in the rectum but poor prep. -On 4/15-worsened clinically, repeat CT abdomen and pelvis which showed frank perforation of the descending colon appears to be contained within a portion of the omentum -General surgery and IR consulted. -Drain placement by IR, and ex lap, LOA, left sigmoid colectomy with descending colostomy (Hartmann's) by general surgery, Dr. Derrell Lollingamirez on 4/16. Abdomen left open out of concern for infectious process and ileus. Started on TPN on 07/18/2018.   -Continue to have minimal oral intake and failure to thrive -Palliative care consulted and following, family understands poor prognosis -5/4 underwent mesenteric angiogram: Noted chronic celiac artery occlusion with no options for revascularization -Palliative care discussions ongoing  Assessment & Plan:   Principal Problem:   Lower abdominal pain Active Problems:   Hyperlipidemia with target LDL less than 100   TOBACCO ABUSE   Essential hypertension   Peripheral vascular disease (HCC)   Sepsis (HCC)   Severe protein-calorie malnutrition (HCC)   Abnormal liver function   Loss of weight   Nausea without vomiting   Abnormal CT scan, colon   Bowel perforation (HCC)   Palliative care by specialist   Goals of care, counseling/discussion  Contained perforated descending colon/ chronic mesenteric ischemia -S/p IR drain placement on 4/16 which showed a large left colon defect,  -4/16 S/p Ex Lap, LOA, Left sigmoid colectomy, descending colostomy (Hartmann's), Dr. Derrell Lollingamirez -has multiple intra abdominal abscesses -suspected to have a possible stomach perforation as well -Postop ileus-resolved, but very poor Po intake, atleast in part from ischemic stomach -open wound->Continue wet-to-dry dressings, general surgery following -Supported with TNA since 4/18 -Continue meropenem 4/15--per ID.  Dr. Allena KatzPatel discussed with ID, 4 weeks therapy recommended -Vascular following, underwent mesenteric angiogram 5/4 noted chronic occlusion of celiac artery, 50% SMA occlusion, celiac artery occlusion not amenable for percutaneous intervention, not a candidate for operative reconstruction due to overall debility -Patient's oral intake remains very poor, ongoing failure to thrive, very poor chances of meaningful recovery especially in light of chronic intestinal ischemia and ischemic stomach with no revascularization options. -Family understands poor prognosis, TNA was stopped 5/4, continue goals of care discussions with possible consideration of hospice -Palliative Care following  Paroxysmal atrial fibrillation -Currently in sinus rhythm. 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  - from occluded celiac axis -Continue PPI as tolerated -mesenteric angiogram as above  Significant weight loss/moderate protein calorie malnutrition: -Resulting from chronic bowel ischemia -Was on TPN for 2 weeks,  discontinued 5/4  History of COPD: Stable -Continue DuoNeb as needed   Peripheral vascular disease  with dry gangrene: Stable. -Continue home statin as tolerated -was on therapeutic anticoagulation with IV heparin per pharmacy, heparin was held following recurrent upper GI bleed -Seems stable at this time  Essential hypertension:  -Continue home meds as tolerated  Chronic pain: -Lower narcotics and Lyrica dose due to oversedation  Tobacco use disorder: -Cessation counseling -Continue Nicotine patch. Stable currently  Oral thrush. -Treated with with Diflucan.  Acute toxic/metabolic encephalopathy -Resolved, due to polypharmacy -Decreased narcotics and Lyrica dose  DVT prophylaxis: SCD's Code Status: DNR Family Communication: Pt in room, family not at bedside Disposition Plan: Uncertain at this time  Consultants:   Vascular surgery  Gastroenterology  Infectious disease  Interventional radiology  General surgery  Procedures:   EGD on 4/14  Flex sigmoidoscopy on 4/14  IR drain placement on 4/16 which showed a large left colon defect, 4/16  Ex Lap, LOA, Left sigmoid colectomy, descending colostomy (Hartmann's), Dr. Derrell Lolling, 4/16  Antimicrobials: Anti-infectives (From admission, onward)   Start     Dose/Rate Route Frequency Ordered Stop   07/28/18 1000  fluconazole (DIFLUCAN) tablet 100 mg     100 mg Oral Daily 07/27/18 1219 08/04/18 0959   07/27/18 1400  fluconazole (DIFLUCAN) IVPB 200 mg     200 mg 100 mL/hr over 60 Minutes Intravenous  Once 07/27/18 1219 07/27/18 1426   07/15/18 1700  meropenem (MERREM) 1 g in sodium chloride 0.9 % 100 mL IVPB     1 g 200 mL/hr over 30 Minutes Intravenous Every 8 hours 07/15/18 1620 08/14/18 2359   07/08/18 2300  vancomycin (VANCOCIN) 1,500 mg in sodium chloride 0.9 % 500 mL IVPB  Status:  Discontinued     1,500 mg 250 mL/hr over 120 Minutes Intravenous  Once 07/07/18 2128 07/07/18 2129   07/08/18 2300   vancomycin (VANCOCIN) 1,500 mg in sodium chloride 0.9 % 500 mL IVPB  Status:  Discontinued     1,500 mg 250 mL/hr over 120 Minutes Intravenous Every 24 hours 07/07/18 2129 07/08/18 0726   07/08/18 0600  ceFEPIme (MAXIPIME) 2 g in sodium chloride 0.9 % 100 mL IVPB  Status:  Discontinued     2 g 200 mL/hr over 30 Minutes Intravenous Every 8 hours 07/07/18 2124 07/13/18 1333   07/07/18 2130  vancomycin (VANCOCIN) 1,500 mg in sodium chloride 0.9 % 500 mL IVPB     1,500 mg 250 mL/hr over 120 Minutes Intravenous  Once 07/07/18 2123 07/08/18 0028   07/07/18 2115  vancomycin (VANCOCIN) IVPB 1000 mg/200 mL premix  Status:  Discontinued     1,000 mg 200 mL/hr over 60 Minutes Intravenous  Once 07/07/18 2110 07/07/18 2128   07/07/18 2115  ceFEPIme (MAXIPIME) 2 g in sodium chloride 0.9 % 100 mL IVPB     2 g 200 mL/hr over 30 Minutes Intravenous  Once 07/07/18 2110 07/07/18 2203       Subjective: Without complaints. Claims she will eat more today  Objective: Vitals:   08/05/18 0457 08/05/18 0553 08/05/18 1003 08/05/18 1144  BP: 128/67  (!) 112/59 (!) 127/46  Pulse: 70  74 66  Resp: Temp: 98.2 F (36.8 C)   98.9 F (37.2 C)  TempSrc: Oral   Oral  SpO2: 97%   97%  Weight:  60.3 kg    Height:        Intake/Output Summary (Last 24 hours) at 08/05/2018 1433 Last data filed at 08/05/2018 1300 Gross per 24 hour  Intake 200 ml  Output  1000 ml  Net -800 ml   Filed Weights   08/04/18 0545 08/04/18 0622 08/05/18 0553  Weight: 61.8 kg 60.6 kg 60.3 kg    Examination:  General exam: Appears calm and comfortable  Respiratory system: Clear to auscultation. Respiratory effort normal. Cardiovascular system: S1 & S2 heard, RRR Gastrointestinal system: Abdomen is nondistended, soft and nontender. No organomegaly or masses felt. Normal bowel sounds heard. Central nervous system: Alert and oriented. No focal neurological deficits. Extremities: Symmetric 5 x 5 power. Skin: No rashes,  lesions Psychiatry: Judgement and insight appear normal. Mood & affect appropriate.   Data Reviewed: I have personally reviewed following labs and imaging studies  CBC: Recent Labs  Lab 07/30/18 0313 07/30/18 2200 07/31/18 0500 08/01/18 0429 08/03/18 0426 08/03/18 0757  WBC 13.5* 10.9* 11.5* 11.1* 11.1* 8.7  NEUTROABS 7.6  --  6.4  --  5.7 4.7  HGB 7.1* 8.2* 8.0* 8.0* 5.8* 8.9*  HCT 23.4* 26.4* 25.5* 26.1* 19.0* 28.3*  MCV 80.4 80.7 80.4 82.6 83.7 82.5  PLT 403* 386 389 390 477* 427*   Basic Metabolic Panel: Recent Labs  Lab 07/30/18 0313 07/31/18 0500 08/01/18 0429 08/03/18 0426  NA 134* 135 135 135  K 4.3 4.2 4.3 4.6  CL 106 105 108 107  CO2 22 20* 20* 20*  GLUCOSE 124* 119* 107* 150*  BUN 15 15 16 17   CREATININE 0.39* 0.47 0.45 <0.30*  CALCIUM 8.3* 8.5* 8.3* 8.5*  MG 1.6* 1.9 2.0 2.1  PHOS 3.9  --   --  4.3   GFR: CrCl cannot be calculated (This lab value cannot be used to calculate CrCl because it is not a number: <0.30). Liver Function Tests: Recent Labs  Lab 07/30/18 0313 08/03/18 0426  AST 85* 199*  ALT 79* 164*  ALKPHOS 252* 257*  BILITOT 0.3 0.5  PROT 6.2* 6.8  ALBUMIN 1.4* 1.6*   No results for input(s): LIPASE, AMYLASE in the last 168 hours. No results for input(s): AMMONIA in the last 168 hours. Coagulation Profile: Recent Labs  Lab 08/03/18 0426  INR 1.3*   Cardiac Enzymes: No results for input(s): CKTOTAL, CKMB, CKMBINDEX, TROPONINI in the last 168 hours. BNP (last 3 results) No results for input(s): PROBNP in the last 8760 hours. HbA1C: No results for input(s): HGBA1C in the last 72 hours. CBG: Recent Labs  Lab 08/03/18 0812 08/03/18 1144 08/03/18 1606 08/03/18 2121 08/04/18 0624  GLUCAP 113* 129* 117* 100* 114*   Lipid Profile: Recent Labs    08/03/18 0426  TRIG 105   Thyroid Function Tests: No results for input(s): TSH, T4TOTAL, FREET4, T3FREE, THYROIDAB in the last 72 hours. Anemia Panel: No results for input(s):  VITAMINB12, FOLATE, FERRITIN, TIBC, IRON, RETICCTPCT in the last 72 hours. Sepsis Labs: No results for input(s): PROCALCITON, LATICACIDVEN in the last 168 hours.  No results found for this or any previous visit (from the past 240 hour(s)).   Radiology Studies: No results found.  Scheduled Meds: . sodium chloride   Intravenous Once  . Chlorhexidine Gluconate Cloth  6 each Topical Q0600  . docusate sodium  100 mg Oral BID  . feeding supplement (ENSURE ENLIVE)  237 mL Oral TID BM  . folic acid  1 mg Oral Daily  . lidocaine  1 patch Transdermal Q24H  . magic mouthwash  10 mL Oral QID  . metoprolol tartrate  75 mg Oral BID  . multivitamin with minerals  1 tablet Oral Daily  . nicotine  21 mg Transdermal  Daily  . pantoprazole  40 mg Oral Daily  . pregabalin  50 mg Oral BID  . saccharomyces boulardii  250 mg Oral BID  . sodium chloride flush  10-40 mL Intracatheter Q12H  . sodium chloride flush  3 mL Intravenous Q12H  . thiamine  100 mg Oral Daily   Continuous Infusions: . sodium chloride 10 mL/hr at 07/22/18 2300  . sodium chloride    . meropenem (MERREM) IV 1 g (08/05/18 0514)  . methocarbamol (ROBAXIN) IV       LOS: 29 days   Rickey Barbara, MD [facility] Pager On Amion  If 7PM-7AM, please contact night-coverage 08/05/2018, 2:33 PM

## 2018-08-05 NOTE — Progress Notes (Signed)
Pharmacy Antibiotic Note  [name] is a 73 y.o. female with IA perf and multiple intraabdominal abscesses. Pharmacy dosing meropenem (last day of therapy is 5/15 -WBC- 8.7, afebrile, SCr < 0.3   Cefepime 4/7 >> 4/13 (PNA, sepsis) Vanco 4/7 >> 4/8 Meropenem 4/15>> Fluconazole 4/28 >> (5/15)  4/7 BCx x 2: negative 4/8 mrsa pcr negative    Plan: Continue Merrem 1g IV q 8hrs -Will follow renal function and clinical progress   Height: 5\' 7"  (170.2 cm) Weight: 132 lb 14.4 oz (60.3 kg)(scale A) IBW/kg (Calculated) : 61.6  Temp (24hrs), Avg:98.4 F (36.9 C), Min:98.2 F (36.8 C), Max:98.6 F (37 C)  Recent Labs  Lab 07/30/18 0313 07/30/18 2200 07/31/18 0500 08/01/18 0429 08/03/18 0426 08/03/18 0757  WBC 13.5* 10.9* 11.5* 11.1* 11.1* 8.7  CREATININE 0.39*  --  0.47 0.45 <0.30*  --     CrCl cannot be calculated (This lab value cannot be used to calculate CrCl because it is not a number: <0.30).    No Known Allergies  Harland German, PharmD Clinical Pharmacist **Pharmacist phone directory can now be found on [email or web address] ([email or web address]).  Listed under [facility].

## 2018-08-05 NOTE — Progress Notes (Signed)
Physical Therapy Treatment Patient Details Name: Danielle FilbertLinda S Grant MRN: [id number] DOB: 02/17/1946 Today's Date: 08/05/2018    History of Present Illness  Danielle FilbertLinda S Grant is a 73 y.o. female with medical history significant of chronic back pain, GERD, hyperlipidemia, hypertension, left knee osteoarthritis, palpitations, PVD h/o Aortofem bypass, s/p RLE thromboembolectomy and R common emoral artery patch angioplasty 05/31/18, history of tobacco use who presents to ER due to fall at home.  patient now s/p EXPLORATORY LAPAROTOMY, lysis of adhesions X 45 minutes LEFT SIGMOID COLECTOMY, SPLENIC FLEXOR MOBILIZATION,DESCENDING COLOSTOMY on 4/16.    PT Comments    Pt displayed a more positive demeanor.  She need little encouragement to participate.  Today's push was for ambulation.  Pt was able to ambulate steadily with light use of the RW.  Her speed was slower to moderate and she was able to scan and converse without notable deviation and no LOB.    Follow Up Recommendations  SNF     Equipment Recommendations  Other (comment)(TBA)    Recommendations for Other Services       Precautions / Restrictions Precautions Precautions: Fall    Mobility  Bed Mobility Overal bed mobility: Needs Assistance Bed Mobility: Supine to Sit;Sit to Supine     Supine to sit: Supervision Sit to supine: Supervision   General bed mobility comments: pt transitioned up /down via R elbow relatively effortless.  Transfers Overall transfer level: Needs assistance   Transfers: Sit to/from Stand Sit to Stand: Supervision         General transfer comment: used UE's, but no assist  Ambulation/Gait Ambulation/Gait assistance: Min guard Gait Distance (Feet): 100 Feet Assistive device: Rolling walker (2 wheeled) Gait Pattern/deviations: Step-through pattern     General Gait Details: generally steady with light use of the RW.  Able to scan without overt deviation.  Gait speed slower to  moderate.   Stairs             Wheelchair Mobility    Modified Rankin (Stroke Patients Only)       Balance     Sitting balance-Leahy Scale: Fair       Standing balance-Leahy Scale: Fair Standing balance comment: preferring the RW, but stood statically without.                            Cognition Arousal/Alertness: Awake/alert Behavior During Therapy: WFL for tasks assessed/performed Overall Cognitive Status: Within Functional Limits for tasks assessed(NT formally)                                        Exercises      General Comments General comments (skin integrity, edema, etc.): VSS during gait      Pertinent Vitals/Pain Pain Assessment: Faces Faces Pain Scale: Hurts a little bit Pain Location: stomach Pain Descriptors / Indicators: Discomfort Pain Intervention(s): Monitored during session    Home Living                      Prior Function            PT Goals (current goals can now be found in the care plan section) Acute Rehab PT Goals Patient Stated Goal: stop the nausea PT Goal Formulation: With patient Time For Goal Achievement: 08/07/18 Potential to Achieve Goals: Good Progress towards PT goals: Progressing toward goals  Frequency    Min 2X/week      PT Plan Current plan remains appropriate    Co-evaluation              AM-PAC PT "6 Clicks" Mobility   Outcome Measure  Help needed turning from your back to your side while in a flat bed without using bedrails?: None Help needed moving from lying on your back to sitting on the side of a flat bed without using bedrails?: None Help needed moving to and from a bed to a chair (including a wheelchair)?: None Help needed standing up from a chair using your arms (e.g., wheelchair or bedside chair)?: None Help needed to walk in hospital room?: A Little Help needed climbing 3-5 steps with a railing? : None 6 Click Score: 23    End of  Session   Activity Tolerance: Patient tolerated treatment well Patient left: in bed;with call bell/phone within reach Nurse Communication: Mobility status PT Visit Diagnosis: Other abnormalities of gait and mobility (R26.89);Difficulty in walking, not elsewhere classified (R26.2)     Time: 3500-9381 PT Time Calculation (min) (ACUTE ONLY): 13 min  Charges:  $Gait Training: 8-22 mins                     08/05/2018   Bing, PT Acute Rehabilitation Services [phone]  (pager) [phone]  (office)   [name] 08/05/2018, 3:29 PM

## 2018-08-05 NOTE — Progress Notes (Signed)
Daily Progress Note   Patient Name: Danielle FilbertLinda S Grant       Date: 08/05/2018 DOB: [birth year]  Age: 73 y.o. MRN#: [id number] Attending Physician: Jerald Kiefhiu, Stephen K, MD Primary Care Physician: [name], [name], [name] Admit Date: 07/07/2018  Reason for Consultation/Follow-up: Establishing goals of care  Subjective: Patient is sleeping on entering room.  She awakens but reports that she is tired and not up to talking today,  I called and discussed with her daughter, Danielle Relicammy.    Length of Stay: 29  Current Medications: Scheduled Meds:  . sodium chloride   Intravenous Once  . Chlorhexidine Gluconate Cloth  6 each Topical Q0600  . docusate sodium  100 mg Oral BID  . feeding supplement (ENSURE ENLIVE)  237 mL Oral TID BM  . folic acid  1 mg Oral Daily  . lidocaine  1 patch Transdermal Q24H  . magic mouthwash  10 mL Oral QID  . metoprolol tartrate  75 mg Oral BID  . multivitamin with minerals  1 tablet Oral Daily  . nicotine  21 mg Transdermal Daily  . pantoprazole  40 mg Oral Daily  . pregabalin  50 mg Oral BID  . saccharomyces boulardii  250 mg Oral BID  . sodium chloride flush  10-40 mL Intracatheter Q12H  . sodium chloride flush  3 mL Intravenous Q12H  . thiamine  100 mg Oral Daily    Continuous Infusions: . sodium chloride 10 mL/hr at 07/22/18 2300  . sodium chloride    . meropenem (MERREM) IV 1 g (08/05/18 1458)  . methocarbamol (ROBAXIN) IV      PRN Meds: sodium chloride, sodium chloride, acetaminophen, alum & mag hydroxide-simeth, hydrALAZINE, ipratropium-albuterol, labetalol, methocarbamol (ROBAXIN) IV, morphine injection, ondansetron (ZOFRAN) IV, oxyCODONE, phenol, promethazine, sodium chloride flush, sodium chloride flush  Physical Exam         NAD Sleeping in chair but  awakens easily Has colostomy Trace edema Non focal Regular S1 S 2  Vital Signs: BP (!) 127/46 (BP Location: Left Arm)   Pulse 66   Temp 98.9 F (37.2 C) (Oral)   Resp 17   Ht 5\' 7"  (1.702 m)   Wt 60.3 kg Comment: scale A  SpO2 97%   BMI 20.82 kg/m  SpO2: SpO2: 97 % O2 Device: O2 Device: Room Air O2 Flow  Rate: O2 Flow Rate (L/min): 2 L/min  Intake/output summary:   Intake/Output Summary (Last 24 hours) at 08/05/2018 1616 Last data filed at 08/05/2018 1300 Gross per 24 hour  Intake 100 ml  Output 1000 ml  Net -900 ml   LBM: Last BM Date: 08/02/18 Baseline Weight: Weight: 74.8 kg Most recent weight: Weight: 60.3 kg(scale A)       Palliative Assessment/Data:    Flowsheet Rows     Most Recent Value  Intake Tab  Referral Department  Hospitalist  Unit at Time of Referral  Cardiac/Telemetry Unit  Date Notified  07/30/18  Palliative Care Type  New Palliative care  Reason for referral  Clarify Goals of Care  Date of Admission  07/07/18  Date first seen by Palliative Care  07/31/18  # of days Palliative referral response time  1 Day(s)  # of days IP prior to Palliative referral  23  Clinical Assessment  Psychosocial & Spiritual Assessment  Palliative Care Outcomes      Patient Active Problem List   Diagnosis Date Noted  . Palliative care by specialist   . Goals of care, counseling/discussion   . Bowel perforation (HCC) 07/16/2018  . Lower abdominal pain   . Abnormal CT scan, colon   . Abnormal liver function   . Loss of weight   . Nausea without vomiting   . Sepsis (HCC) 07/07/2018  . Severe protein-calorie malnutrition (HCC) 07/07/2018  . Contusion of right hip   . COPD with acute exacerbation (HCC)   . General weakness 05/28/2018  . Fall 05/28/2018  . UTI (urinary tract infection) 05/28/2018  . Pancreatitis 05/28/2018  . Lumbar radiculopathy 07/30/2016  . Depression 07/24/2015  . S/P lumbar fusion 01/27/2015  . GAD (generalized anxiety disorder)  06/13/2014  . Neck pain 07/14/2012  . S/P lumbar spinal fusion 07/14/2012  . Lumbar stenosis with neurogenic claudication 06/11/2012  . DDD (degenerative disc disease), lumbosacral 04/24/2012  . Spondylolisthesis at L5-S1 level 04/24/2012  . Hyperlipidemia with target LDL less than 100 06/16/2009  . TOBACCO ABUSE 06/16/2009  . Essential hypertension 06/16/2009  . Peripheral vascular disease (HCC) 06/16/2009  . GERD 06/16/2009  . Chronic back pain 06/16/2009  . CAROTID BRUIT 05/23/2009    Palliative Care Assessment & Plan   Patient Profile:    Assessment:  73 yo lady who has suspected chronic mesenteric ischemia, descending colon perforation, A fib, PVD/ mesenteric ischemia. 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  Remains on TNA Cortrak not able to be placed and enteric feeds unable to be initiated due to concern for ulcerative gastritis with concern for ischemia.   Recommendations/Plan: Patient sitting in chair on entering room.  She declined for me to call her daughters for our conversation but consented for me to call them after we spoke.  She reports understanding that she is very ill, but she feels confident that her body is telling her she is "on the upswing and getting better."  Discussed concerns regarding declines in nutrition and functional status and that she is at high risk for continued decline.  She understands concerns, but minimizes concerns about nutrition and hydration stating, "I can take in more.  I will just try harder today to prove it."  She is clear that her goal is to return to her home and see her 5yo grandson.  She did not want to engage when made brief mention of possibility of electing her hospice benefits as pathway toward  being able to return to her home.  Discussed with the patient's daughters, [name] and [name], over the phone.  They have clear understanding of severity of her condition and that she is likely to  continue to decline moving forward.  Her daughters are clear that she would want to be home and inquired about care under hospice at home or in residential hospice setting.  Answered their questions regarding this to the best of my ability.  [name] is limited in her acceptance of how ill she is and hopes that her condition will improve to the point that she is able to return to her prior independent living.  Her daughters are much more realistic of the severity of her condition and want to support her to be at home as this has always been her wish.  They think she would be well served to elect to transition home with hospice, but she is not open to this possibility at this time.  Will continue to engage with patient and family to progress conversation based on clinical course and as [name] is emotionally able to do so.       Code Status:    Code Status Orders  (From admission, onward)         Start     Ordered   07/30/18 1357  Do not attempt resuscitation (DNR)  Continuous    Question Answer Comment  In the event of cardiac or respiratory ARREST Do not call a "code blue"   In the event of cardiac or respiratory ARREST Do not perform Intubation, CPR, defibrillation or ACLS   In the event of cardiac or respiratory ARREST Use medication by any route, position, wound care, and other measures to relive pain and suffering. May use oxygen, suction and manual treatment of airway obstruction as needed for comfort.      07/30/18 1357        Code Status History    Date Active Date Inactive Code Status Order ID Comments User Context   07/07/2018 2335 07/30/2018 1357 Full Code [id number]  Pearson Grippe, MD ED   05/28/2018 0119 06/08/2018 1936 Full Code [id number]  Bobette Mo, MD ED       Prognosis:  ? Weeks with her limited nutrition and hydration?  Discharge Planning:  To Be Determined- Family inquiring about home with hospice.  Patient clear in desire to go home, but not open to  hospice conversation.  She feels that she will be able to improve if she "tries harder" to increase nutrition and hydration.  Care plan was discussed with  Patient, patient's daughters [name] and [name]) on the phone.   Thank you for allowing the Palliative Medicine Team to assist in the care of this patient.   Time In: 1000 Time Out: 1050 Total Time 50 Prolonged Time Billed  no       Greater than 50%  of this time was spent counseling and coordinating care related to the above assessment and plan.  Romie Minus, MD Please contact Palliative Medicine Team phone at [phone] for questions and concerns.

## 2018-08-05 NOTE — Progress Notes (Signed)
2 Days Post-Op  Subjective: No new complaints. Denies abdominal pain, N/V. Very little intake yesterday. Passing flatus and stool in colostomy while I was in the room.   Objective: Vital signs in last 24 hours: Temp:  [98.2 F (36.8 C)-98.6 F (37 C)] 98.2 F (36.8 C) (05/06 0457) Pulse Rate:  [70-76] 70 (05/06 0457) Resp:  [12-19] 14 (05/06 0553) BP: (125-137)/(64-73) 128/67 (05/06 0457) SpO2:  [96 %-97 %] 97 % (05/06 0457) Weight:  [60.3 kg] 60.3 kg (05/06 0553) Last BM Date: 08/02/18  Intake/Output from previous day: 05/05 0701 - 05/06 0700 In: 200 [P.O.:100; IV Piggyback:100] Out: 700 [Urine:700] Intake/Output this shift: No intake/output data recorded.  PE: Gen: Alert, NAD, pleasant HEENT: EOM's intact, pupils equal and round Pulm: effort normal Skin: warm and dry Abd: Soft, NT/ND,abd binder in place. Open midline incision with fibrinous exudate at proximal portion and dehiscence of the entire wound, trace serous drainage, ostomy pink with a small area of clotted cephalic. This appears stable without active bleeding. There is air and stool in bag. Patient passing flatus and BM into bag while I was in the room. See picture below.       Lab Results:  Recent Labs    08/03/18 0426 08/03/18 0757  WBC 11.1* 8.7  HGB 5.8* 8.9*  HCT 19.0* 28.3*  PLT 477* 427*   BMET Recent Labs    08/03/18 0426  NA 135  K 4.6  CL 107  CO2 20*  GLUCOSE 150*  BUN 17  CREATININE <0.30*  CALCIUM 8.5*   PT/INR Recent Labs    08/03/18 0426  LABPROT 15.7*  INR 1.3*   CMP     Component Value Date/Time   NA 135 08/03/2018 0426   NA 142 05/22/2018 1039   K 4.6 08/03/2018 0426   CL 107 08/03/2018 0426   CO2 20 (L) 08/03/2018 0426   GLUCOSE 150 (H) 08/03/2018 0426   BUN 17 08/03/2018 0426   BUN 10 05/22/2018 1039   CREATININE <0.30 (L) 08/03/2018 0426   CREATININE 0.72 08/07/2012 1016   CALCIUM 8.5 (L) 08/03/2018 0426   PROT 6.8 08/03/2018 0426   PROT 6.8  05/22/2018 1039   ALBUMIN 1.6 (L) 08/03/2018 0426   ALBUMIN 3.6 (L) 05/22/2018 1039   AST 199 (H) 08/03/2018 0426   ALT 164 (H) 08/03/2018 0426   ALKPHOS 257 (H) 08/03/2018 0426   BILITOT 0.5 08/03/2018 0426   BILITOT 0.3 05/22/2018 1039   GFRNONAA NOT CALCULATED 08/03/2018 0426   GFRNONAA 88 08/07/2012 1016   GFRAA NOT CALCULATED 08/03/2018 0426   GFRAA >89 08/07/2012 1016   Lipase     Component Value Date/Time   LIPASE 65 (H) 07/08/2018 1325       Studies/Results: No results found.  Anti-infectives: Anti-infectives (From admission, onward)   Start     Dose/Rate Route Frequency Ordered Stop   07/28/18 1000  fluconazole (DIFLUCAN) tablet 100 mg     100 mg Oral Daily 07/27/18 1219 08/04/18 0959   07/27/18 1400  fluconazole (DIFLUCAN) IVPB 200 mg     200 mg 100 mL/hr over 60 Minutes Intravenous  Once 07/27/18 1219 07/27/18 1426   07/15/18 1700  meropenem (MERREM) 1 g in sodium chloride 0.9 % 100 mL IVPB     1 g 200 mL/hr over 30 Minutes Intravenous Every 8 hours 07/15/18 1620 08/14/18 2359   07/08/18 2300  vancomycin (VANCOCIN) 1,500 mg in sodium chloride 0.9 % 500 mL IVPB  Status:  Discontinued     1,500 mg 250 mL/hr over 120 Minutes Intravenous  Once 07/07/18 2128 07/07/18 2129   07/08/18 2300  vancomycin (VANCOCIN) 1,500 mg in sodium chloride 0.9 % 500 mL IVPB  Status:  Discontinued     1,500 mg 250 mL/hr over 120 Minutes Intravenous Every 24 hours 07/07/18 2129 07/08/18 0726   07/08/18 0600  ceFEPIme (MAXIPIME) 2 g in sodium chloride 0.9 % 100 mL IVPB  Status:  Discontinued     2 g 200 mL/hr over 30 Minutes Intravenous Every 8 hours 07/07/18 2124 07/13/18 1333   07/07/18 2130  vancomycin (VANCOCIN) 1,500 mg in sodium chloride 0.9 % 500 mL IVPB     1,500 mg 250 mL/hr over 120 Minutes Intravenous  Once 07/07/18 2123 07/08/18 0028   07/07/18 2115  vancomycin (VANCOCIN) IVPB 1000 mg/200 mL premix  Status:  Discontinued     1,000 mg 200 mL/hr over 60 Minutes Intravenous   Once 07/07/18 2110 07/07/18 2128   07/07/18 2115  ceFEPIme (MAXIPIME) 2 g in sodium chloride 0.9 % 100 mL IVPB     2 g 200 mL/hr over 30 Minutes Intravenous  Once 07/07/18 2110 07/07/18 2203       Assessment/Plan A. Fibonheparin gtt PVD/mesenteric ischemia - angiogram by Vascular on 5/4 S/pright femoral thrombectomy on 3/1/2020and aortobifem 2002  HTN HLD COPD Tobacco abuse- quit smoking on admission Chronic back pain, 50% stenosis SMA Severe malnutrition - prealbumin11.2 (5/4) from5.5, multiple factorial from surgery and odynophagia from tongue sores Hypomagnesemia -resolved Leukocytosis-resolved. 8.7 on 5/4 Anemia - Hgb 5.8 5/4. 8.9 on 5/4 after PRBC. VSS Code status DNR  Suspected chronic mesenteric ischemia Descending colon perforation on CT s/p Ex Lap, LOA, Left sigmoid colectomy, descending colostomy (Hartmann's), Dr. Derrell Lollingamirez, 4/16POD #20 -TID WTD dressing changesto help with necrotic tissue at base of wound -cont onHHdietand ensure although patientcontinues to have poor oral intake. TPN d/c'd 5/4 -colostomy functioning. Small amount of clotted blood on stoma cephalic. No active bleeding. Monitor.  -mobilize and pulm toilet -attemptedCortrak placement for TFs but patient vomited a blood clot 5/1. 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 Given new information, we will defer on Cortrak placement to avoid complications given low integrity of stomach mucosa. Pt underwent aortogram yesterday by vascular. 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 TPN d/c 5/4. Patient has poor intake. Unfortunately, has not been progressing well since surgery and does not have a reliable long term source of nutrition. No good surgical options at this time to improve her status.  I discussed with the daughter [name]  over the phone yesterday. Per palliative's note, they plan for a meeting with the patient and daughter via phone around 10AM today. Appreciate palliative team discussion regarding goals of care with family.  FEN-HHdiet/ensure VTE -SCDs, heparindripon hold due to ABL anemia. Can restart from a surgical standpoint if repeat Hgb stable.  ID -currentlyMeropenem 4/15 per ID >>, diflucan 4/27>> Foley - Removed POD 1 Follow up -Dr. Derrell Lollingamirez POC: Dellis Filbertammy Kallam [phone] daughter/[name] [phone].  Plan: Appreciate palliative assistance with further goals of care discussions.    LOS: 29 days    Jacinto HalimMichael M Jasia Hiltunen , [facility] 08/05/2018, 8:54 AM Pager: [phone]

## 2018-08-06 DIAGNOSIS — R933 Abnormal findings on diagnostic imaging of other parts of digestive tract: Secondary | ICD-10-CM

## 2018-08-06 NOTE — Progress Notes (Signed)
PROGRESS NOTE    [name]  GMW:[id number] DOB: Sep 06, 1945 DOA: 07/07/2018 PCP: Junie Spencer, [name]    Brief Narrative:  73 year old female with history of chronic N/V/abdominal pain, A. fib, PVD,hypertension and chronic back pain presented with intractable nausea, vomiting, abdominal pain, new onset of diarrhea and about 30 pounds weight loss in the last several months.  Admitted to [facility]  on 07/07/2018, -CTA abdomen and pelvis on 4/9 showed occlusion of celiac axis, or opacification of SMA and right iliac limb, patent SMV, colonic wall thickenings, multiple fluid collections. -Patient was transferred to [facility] for further management. Patient was also treated with cefepime for presumed pneumonia. Gastroenterology and vascular surgery consulted. 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 She also had flexible sigmoidoscopy that showed only a 7 mm sessile polyp in the rectum but poor prep. -On 4/15-worsened clinically, repeat CT abdomen and pelvis which showed frank perforation of the descending colon appears to be contained within a portion of the omentum -General surgery and IR consulted. -Drain placement by IR, and ex lap, LOA, left sigmoid colectomy with descending colostomy (Hartmann's) by general surgery, Dr. Derrell Lolling on 4/16. Abdomen left open out of concern for infectious process and ileus. Started on TPN on 07/18/2018.   -Continue to have minimal oral intake and failure to thrive -Palliative care consulted and following, family understands poor prognosis -5/4 underwent mesenteric angiogram: Noted chronic celiac artery occlusion with no options for revascularization -Palliative care discussions ongoing  Assessment & Plan:   Principal Problem:   Lower abdominal pain Active Problems:   Hyperlipidemia with target LDL less than 100   TOBACCO ABUSE   Essential hypertension   Peripheral vascular disease (HCC)   Sepsis (HCC)   Severe protein-calorie malnutrition (HCC)   Abnormal liver function   Loss of weight   Nausea without vomiting   Abnormal CT scan, colon   Bowel perforation (HCC)   Palliative care by specialist   Goals of care, counseling/discussion  Contained perforated descending colon/ chronic mesenteric ischemia -S/p IR drain placement on 4/16 which showed a large left colon defect,  -4/16 S/p Ex Lap, LOA, Left sigmoid colectomy, descending colostomy (Hartmann's), Dr. Derrell Lolling -has multiple intra abdominal abscesses -suspected to have a possible stomach perforation as well -Postop ileus-resolved, but very poor Po intake, atleast in part from ischemic stomach -open wound->Continue wet-to-dry dressings, general surgery following -Supported with TNA since 4/18 -Continue meropenem 4/15--per ID.  Dr. 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 -Family understands poor prognosis, TNA was stopped 5/4, continue goals of care discussions with possible consideration of hospice -Palliative Care planning in the works.   Paroxysmal atrial fibrillation -Currently in sinus rhythm. 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  - from occluded celiac axis -Continue PPI as tolerated -mesenteric angiogram as above -Seems to be stable. Encourage PO as tolerated  Significant weight loss/moderate protein calorie malnutrition:  -Resulting from chronic bowel ischemia -Was on TPN for 2 weeks, discontinued 5/4 -continue  to encourage PO intake as tolerated  History of COPD:  -Continue DuoNeb as needed -Stable at this time   Peripheral vascular disease with dry gangrene: Stable. -Continue home statin as tolerated -was on therapeutic anticoagulation with IV heparin per pharmacy, heparin was held following recurrent upper GI bleed -Presently stable  Essential hypertension:  -Continue home meds as tolerated  Chronic pain: -Lower narcotics and Lyrica dose due to oversedation  Tobacco use disorder: -Cessation counseling -Continue Nicotine patch.  -Presently stable  Oral thrush. -Treated with with Diflucan.  Acute toxic/metabolic encephalopathy -Resolved, due to polypharmacy -Decreased narcotics and Lyrica dose  DVT prophylaxis: SCD's Code Status: DNR Family Communication: Pt in room, family not at bedside Disposition Plan: Uncertain at this time  Consultants:   Vascular surgery  Gastroenterology  Infectious disease  Interventional radiology  General surgery  Procedures:   EGD on 4/14  Flex sigmoidoscopy on 4/14  IR drain placement on 4/16 which showed a large left colon defect, 4/16  Ex Lap, LOA, Left sigmoid colectomy, descending colostomy (Hartmann's), Dr. Derrell Lollingamirez, 4/16  Antimicrobials: Anti-infectives (From admission, onward)   Start     Dose/Rate Route Frequency Ordered Stop   07/28/18 1000  fluconazole (DIFLUCAN) tablet 100 mg     100 mg Oral Daily 07/27/18 1219 08/04/18 0959   07/27/18 1400  fluconazole (DIFLUCAN) IVPB 200 mg     200 mg 100 mL/hr over 60 Minutes Intravenous  Once 07/27/18 1219 07/27/18 1426   07/15/18 1700  meropenem (MERREM) 1 g in sodium chloride 0.9 % 100 mL IVPB     1 g 200 mL/hr over 30 Minutes Intravenous Every 8 hours 07/15/18 1620 08/14/18 2359   07/08/18 2300  vancomycin (VANCOCIN) 1,500 mg in sodium chloride 0.9 % 500 mL IVPB  Status:   Discontinued     1,500 mg 250 mL/hr over 120 Minutes Intravenous  Once 07/07/18 2128 07/07/18 2129   07/08/18 2300  vancomycin (VANCOCIN) 1,500 mg in sodium chloride 0.9 % 500 mL IVPB  Status:  Discontinued     1,500 mg 250 mL/hr over 120 Minutes Intravenous Every 24 hours 07/07/18 2129 07/08/18 0726   07/08/18 0600  ceFEPIme (MAXIPIME) 2 g in sodium chloride 0.9 % 100 mL IVPB  Status:  Discontinued     2 g 200 mL/hr over 30 Minutes Intravenous Every 8 hours 07/07/18 2124 07/13/18 1333   07/07/18 2130  vancomycin (VANCOCIN) 1,500 mg in sodium chloride 0.9 % 500 mL IVPB     1,500 mg 250 mL/hr over 120 Minutes Intravenous  Once 07/07/18 2123 07/08/18 0028   07/07/18 2115  vancomycin (VANCOCIN) IVPB 1000 mg/200 mL premix  Status:  Discontinued     1,000 mg 200 mL/hr over 60 Minutes Intravenous  Once 07/07/18 2110 07/07/18 2128   07/07/18 2115  ceFEPIme (MAXIPIME) 2 g in sodium chloride 0.9 % 100 mL IVPB     2 g 200 mL/hr over 30 Minutes Intravenous  Once 07/07/18 2110 07/07/18 2203      Subjective: Pt is without complaints. Admits to continued poor appetitie  Objective: Vitals:   08/05/18 1003 08/05/18 1144 08/05/18 1950 08/06/18 0519  BP: (!) 112/59 (!) 127/46 127/63 (!) 122/41  Pulse: 74 66 72   Resp:  17 18 18   Temp:  98.9 F (37.2 C) 98.3 F (36.8 C) 98.6 F (37 C)  TempSrc:  Oral Oral Oral  SpO2:  97% 95% 93%  Weight:    59.9 kg  Height:  Intake/Output Summary (Last 24 hours) at 08/06/2018 1140 Last data filed at 08/06/2018 0911 Gross per 24 hour  Intake 310 ml  Output 1000 ml  Net -690 ml   Filed Weights   08/04/18 0622 08/05/18 0553 08/06/18 0519  Weight: 60.6 kg 60.3 kg 59.9 kg    Examination: General exam: Asleep, arousable, laying in bed, in nad Respiratory system: Normal respiratory effort, no wheezing Cardiovascular system: regular rate, s1, s2 Gastrointestinal system: Soft, nondistended, positive BS Central nervous system: CN2-12 grossly intact,  strength intact Extremities: Perfused, no clubbing Skin: Normal skin turgor, no notable skin lesions seen Psychiatry: Mood normal // no visual hallucinations   Data Reviewed: I have personally reviewed following labs and imaging studies  CBC: Recent Labs  Lab 07/30/18 2200 07/31/18 0500 08/01/18 0429 08/03/18 0426 08/03/18 0757  WBC 10.9* 11.5* 11.1* 11.1* 8.7  NEUTROABS  --  6.4  --  5.7 4.7  HGB 8.2* 8.0* 8.0* 5.8* 8.9*  HCT 26.4* 25.5* 26.1* 19.0* 28.3*  MCV 80.7 80.4 82.6 83.7 82.5  PLT 386 389 390 477* 427*   Basic Metabolic Panel: Recent Labs  Lab 07/31/18 0500 08/01/18 0429 08/03/18 0426  NA 135 135 135  K 4.2 4.3 4.6  CL 105 108 107  CO2 20* 20* 20*  GLUCOSE 119* 107* 150*  BUN CREATININE 0.47 0.45 <0.30*  CALCIUM 8.5* 8.3* 8.5*  MG 1.9 2.0 2.1  PHOS  --   --  4.3   GFR: CrCl cannot be calculated (This lab value cannot be used to calculate CrCl because it is not a number: <0.30). Liver Function Tests: Recent Labs  Lab 08/03/18 0426  AST 199*  ALT 164*  ALKPHOS 257*  BILITOT 0.5  PROT 6.8  ALBUMIN 1.6*   No results for input(s): LIPASE, AMYLASE in the last 168 hours. No results for input(s): AMMONIA in the last 168 hours. Coagulation Profile: Recent Labs  Lab 08/03/18 0426  INR 1.3*   Cardiac Enzymes: No results for input(s): CKTOTAL, CKMB, CKMBINDEX, TROPONINI in the last 168 hours. BNP (last 3 results) No results for input(s): PROBNP in the last 8760 hours. HbA1C: No results for input(s): HGBA1C in the last 72 hours. CBG: Recent Labs  Lab 08/03/18 0812 08/03/18 1144 08/03/18 1606 08/03/18 2121 08/04/18 0624  GLUCAP 113* 129* 117* 100* 114*   Lipid Profile: No results for input(s): CHOL, HDL, LDLCALC, TRIG, CHOLHDL, LDLDIRECT in the last 72 hours. Thyroid Function Tests: No results for input(s): TSH, T4TOTAL, FREET4, T3FREE, THYROIDAB in the last 72 hours. Anemia Panel: No results for input(s): VITAMINB12, FOLATE,  FERRITIN, TIBC, IRON, RETICCTPCT in the last 72 hours. Sepsis Labs: No results for input(s): PROCALCITON, LATICACIDVEN in the last 168 hours.  No results found for this or any previous visit (from the past 240 hour(s)).   Radiology Studies: No results found.  Scheduled Meds: . sodium chloride   Intravenous Once  . Chlorhexidine Gluconate Cloth  6 each Topical Q0600  . docusate sodium  100 mg Oral BID  . feeding supplement (ENSURE ENLIVE)  237 mL Oral TID BM  . folic acid  1 mg Oral Daily  . lidocaine  1 patch Transdermal Q24H  . magic mouthwash  10 mL Oral QID  . metoprolol tartrate  75 mg Oral BID  . multivitamin with minerals  1 tablet Oral Daily  . nicotine  21 mg Transdermal Daily  . pantoprazole  40 mg Oral Daily  . pregabalin  50 mg  Oral BID  . saccharomyces boulardii  250 mg Oral BID  . sodium chloride flush  10-40 mL Intracatheter Q12H  . sodium chloride flush  3 mL Intravenous Q12H  . thiamine  100 mg Oral Daily   Continuous Infusions: . sodium chloride 10 mL/hr at 07/22/18 2300  . sodium chloride    . meropenem (MERREM) IV 1 g (08/06/18 0548)  . methocarbamol (ROBAXIN) IV       LOS: 30 days   Rickey Barbara, MD [facility] Pager On Amion  If 7PM-7AM, please contact night-coverage 08/06/2018, 11:40 AM

## 2018-08-06 NOTE — Progress Notes (Signed)
Physical Therapy Treatment Patient Details Name: Danielle FilbertLinda S Grant MRN: [id number] DOB: Sep 10, 1945 Today's Date: 08/06/2018    History of Present Illness  [name] is a 73 y.o. female with medical history significant of chronic back pain, GERD, hyperlipidemia, hypertension, left knee osteoarthritis, palpitations, PVD h/o Aortofem bypass, s/p RLE thromboembolectomy and R common emoral artery patch angioplasty 05/31/18, history of tobacco use who presents to ER due to fall at home.  patient now s/p EXPLORATORY LAPAROTOMY, lysis of adhesions X 45 minutes LEFT SIGMOID COLECTOMY, SPLENIC FLEXOR MOBILIZATION,DESCENDING COLOSTOMY on 4/16.    PT Comments    Still encouraging pt to ask for and complete 3 or more trials of walking in the halls.  Pt used RW lightly to go ~200 feet with keeping sats about 94% and EHR in the 100's.  Stress on increasing gait speed and pt scanning her environment as a challenge to balance.    Follow Up Recommendations  SNF     Equipment Recommendations  Other (comment)(TBA)    Recommendations for Other Services       Precautions / Restrictions Precautions Precautions: Fall    Mobility  Bed Mobility Overal bed mobility: Needs Assistance Bed Mobility: Supine to Sit;Sit to Supine     Supine to sit: Supervision Sit to supine: Supervision      Transfers Overall transfer level: Needs assistance   Transfers: Sit to/from Stand Sit to Stand: Supervision         General transfer comment: used UE's, but no assist  Ambulation/Gait Ambulation/Gait assistance: Min guard Gait Distance (Feet): 200 Feet Assistive device: Rolling walker (2 wheeled) Gait Pattern/deviations: Step-through pattern Gait velocity: slower   General Gait Details: steady with light use of the RW, no overt deviation with pt scanning her environment   Stairs             Wheelchair Mobility    Modified Rankin (Stroke Patients Only)       Balance Overall balance  assessment: Needs assistance   Sitting balance-Leahy Scale: Fair(to good)       Standing balance-Leahy Scale: Fair                              Cognition Arousal/Alertness: Awake/alert Behavior During Therapy: WFL for tasks assessed/performed Overall Cognitive Status: Within Functional Limits for tasks assessed                                        Exercises      General Comments General comments (skin integrity, edema, etc.): Sats on RA at 94% and EHR in the 100's      Pertinent Vitals/Pain Faces Pain Scale: Hurts a little bit Pain Location: stomach Pain Descriptors / Indicators: Discomfort Pain Intervention(s): Monitored during session    Home Living                      Prior Function            PT Goals (current goals can now be found in the care plan section) Acute Rehab PT Goals Patient Stated Goal: go home PT Goal Formulation: With patient Time For Goal Achievement: 08/07/18 Potential to Achieve Goals: Good Progress towards PT goals: Progressing toward goals    Frequency    Min 2X/week      PT Plan Current plan remains appropriate  Co-evaluation              AM-PAC PT "6 Clicks" Mobility   Outcome Measure  Help needed turning from your back to your side while in a flat bed without using bedrails?: None Help needed moving from lying on your back to sitting on the side of a flat bed without using bedrails?: None Help needed moving to and from a bed to a chair (including a wheelchair)?: None Help needed standing up from a chair using your arms (e.g., wheelchair or bedside chair)?: None Help needed to walk in hospital room?: A Little Help needed climbing 3-5 steps with a railing? : A Little 6 Click Score: 22    End of Session   Activity Tolerance: Patient tolerated treatment well Patient left: in bed;with call bell/phone within reach Nurse Communication: Mobility status PT Visit Diagnosis: Other  abnormalities of gait and mobility (R26.89);Difficulty in walking, not elsewhere classified (R26.2)     Time: [id number]) PT Time Calculation (min) (ACUTE ONLY): 11 min  Charges:  $Gait Training: 8-22 mins                     08/06/2018  Reno Bing, PT Acute Rehabilitation Services [phone]  (pager) [phone]  (office)   [name] 08/06/2018, 5:41 PM

## 2018-08-06 NOTE — Progress Notes (Signed)
Daily Progress Note   Patient Name: [name]       Date: 08/06/2018 DOB: [birth year]  Age: 73 y.o. MRN#: [id number] Attending Physician: Jerald Kief, MD Primary Care Physician: Junie Spencer, [name] Admit Date: 07/07/2018  Reason for Consultation/Follow-up: Establishing goals of care  Subjective: Patient sitting in bed on entering room.  She reports being in better spirits and that she was able to walk with PT yesterday.  Discussed again regarding concerns about her nutrition and functional status.  She reports that she is hopeful to be able to go home in the next day or two.  Discussed possible election of hospice benefits and how this can benefit her when she returns home.  I called and discussed with her daughter, [name].    Length of Stay: 30  Current Medications: Scheduled Meds:  . sodium chloride   Intravenous Once  . Chlorhexidine Gluconate Cloth  6 each Topical Q0600  . docusate sodium  100 mg Oral BID  . feeding supplement (ENSURE ENLIVE)  237 mL Oral TID BM  . folic acid  1 mg Oral Daily  . lidocaine  1 patch Transdermal Q24H  . magic mouthwash  10 mL Oral QID  . metoprolol tartrate  75 mg Oral BID  . multivitamin with minerals  1 tablet Oral Daily  . nicotine  21 mg Transdermal Daily  . pantoprazole  40 mg Oral Daily  . pregabalin  50 mg Oral BID  . saccharomyces boulardii  250 mg Oral BID  . sodium chloride flush  10-40 mL Intracatheter Q12H  . sodium chloride flush  3 mL Intravenous Q12H  . thiamine  100 mg Oral Daily    Continuous Infusions: . sodium chloride 10 mL/hr at 07/22/18 2300  . sodium chloride    . meropenem (MERREM) IV 1 g (08/06/18 2114)  . methocarbamol (ROBAXIN) IV      PRN Meds: sodium chloride, sodium chloride, acetaminophen, alum &  mag hydroxide-simeth, hydrALAZINE, ipratropium-albuterol, labetalol, methocarbamol (ROBAXIN) IV, morphine injection, ondansetron (ZOFRAN) IV, oxyCODONE, phenol, promethazine, sodium chloride flush, sodium chloride flush  Physical Exam         NAD Sleeping in chair but awakens easily Has colostomy Trace edema Non focal Regular S1 S 2  Vital Signs: BP (!) 144/62 (BP Location: Left Arm)  Pulse 77   Temp 98.5 F (36.9 C) (Oral)   Resp (!) 21   Ht 5\' 7"  (1.702 m)   Wt 59.9 kg   SpO2 97%   BMI 20.67 kg/m  SpO2: SpO2: 97 % O2 Device: O2 Device: Room Air O2 Flow Rate: O2 Flow Rate (L/min): 2 L/min  Intake/output summary:   Intake/Output Summary (Last 24 hours) at 08/06/2018 2217 Last data filed at 08/06/2018 2112 Gross per 24 hour  Intake 120 ml  Output 1000 ml  Net -880 ml   LBM: Last BM Date: 08/06/18 Baseline Weight: Weight: 74.8 kg Most recent weight: Weight: 59.9 kg       Palliative Assessment/Data:    Flowsheet Rows     Most Recent Value  Intake Tab  Referral Department  Hospitalist  Unit at Time of Referral  Cardiac/Telemetry Unit  Date Notified  07/30/18  Palliative Care Type  New Palliative care  Reason for referral  Clarify Goals of Care  Date of Admission  07/07/18  Date first seen by Palliative Care  07/31/18  # of days Palliative referral response time  1 Day(s)  # of days IP prior to Palliative referral  23  Clinical Assessment  Psychosocial & Spiritual Assessment  Palliative Care Outcomes      Patient Active Problem List   Diagnosis Date Noted  . Palliative care by specialist   . Goals of care, counseling/discussion   . Bowel perforation (HCC) 07/16/2018  . Lower abdominal pain   . Abnormal CT scan, colon   . Abnormal liver function   . Loss of weight   . Nausea without vomiting   . Sepsis (HCC) 07/07/2018  . Severe protein-calorie malnutrition (HCC) 07/07/2018  . Contusion of right hip   . COPD with acute exacerbation (HCC)   . General  weakness 05/28/2018  . Fall 05/28/2018  . UTI (urinary tract infection) 05/28/2018  . Pancreatitis 05/28/2018  . Lumbar radiculopathy 07/30/2016  . Depression 07/24/2015  . S/P lumbar fusion 01/27/2015  . GAD (generalized anxiety disorder) 06/13/2014  . Neck pain 07/14/2012  . S/P lumbar spinal fusion 07/14/2012  . Lumbar stenosis with neurogenic claudication 06/11/2012  . DDD (degenerative disc disease), lumbosacral 04/24/2012  . Spondylolisthesis at L5-S1 level 04/24/2012  . Hyperlipidemia with target LDL less than 100 06/16/2009  . TOBACCO ABUSE 06/16/2009  . Essential hypertension 06/16/2009  . Peripheral vascular disease (HCC) 06/16/2009  . GERD 06/16/2009  . Chronic back pain 06/16/2009  . CAROTID BRUIT 05/23/2009    Palliative Care Assessment & Plan   Patient Profile:    Assessment:  73 yo lady who has suspected chronic mesenteric ischemia, descending colon perforation, A fib, PVD/ mesenteric ischemia. 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  Remains on TNA Cortrak not able to be placed and enteric feeds unable to be initiated due to concern for ulcerative gastritis with concern for ischemia.   Recommendations/Plan: Patient lying in bed on entering room.  Discussed again regarding her goal of returning home and spending time with family.   [name] reports today that she is set on returning home.  She tells me she will consider home hospice on discharge to support her for transition home.  Plan to f/u again tomorrow to continue discussion.  Discussed with daughter, [name].  She reports that family wants to support her in goal of returning home.  Will continue to follow.    Code Status:    Code Status  Orders  (From admission, onward)         Start     Ordered   07/30/18 1357  Do not attempt resuscitation (DNR)  Continuous    Question Answer Comment  In the event of cardiac or respiratory ARREST Do not call a "code  blue"   In the event of cardiac or respiratory ARREST Do not perform Intubation, CPR, defibrillation or ACLS   In the event of cardiac or respiratory ARREST Use medication by any route, position, wound care, and other measures to relive pain and suffering. May use oxygen, suction and manual treatment of airway obstruction as needed for comfort.      07/30/18 1357        Code Status History    Date Active Date Inactive Code Status Order ID Comments User Context   07/07/2018 2335 07/30/2018 1357 Full Code [id number]  Pearson Grippe, MD ED   05/28/2018 0119 06/08/2018 1936 Full Code [id number]  Bobette Mo, MD ED       Prognosis:  ? Weeks with her limited nutrition and hydration?  Discharge Planning:  To Be Determined- Family inquiring about home with hospice.  Patient clear in desire to go home, becoming more open to hospice conversation.  She feels that she will continue to improve if she "tries harder" to increase nutrition and hydration.  Care plan was discussed with  Patient, patient's daughters [name] and Aram Beecham) on the phone.   Thank you for allowing the Palliative Medicine Team to assist in the care of this patient.   Total Time 40 Prolonged Time Billed  no       Greater than 50%  of this time was spent counseling and coordinating care related to the above assessment and plan.  Romie Minus, MD Please contact Palliative Medicine Team phone at [phone] for questions and concerns.

## 2018-08-06 NOTE — Progress Notes (Signed)
Nutrition Follow-up  DOCUMENTATION CODES:   Severe malnutrition in context of acute illness/injury  INTERVENTION:    Continue Ensure Enlive po BID, each supplement provides 350 kcal and 20 grams of protein  Continue Magic cup TID with meals, each supplement provides 290 kcal and 9 grams of protein  Liberalize diet to regular   NUTRITION DIAGNOSIS:   Severe Malnutrition related to acute illness(probable ischemic colitis) as evidenced by (21% wt loss in 4 months and an estimated energy intake < or equal to 50% for > or equal to 5 days.).  Ongoing   GOAL:   Patient will meet greater than or equal to 90% of their needs  Not meeting   MONITOR:   PO intake, Supplement acceptance, Labs, Skin, I & O's, Weight trends  REASON FOR ASSESSMENT:   Consult New TPN/TNA  ASSESSMENT:   74 y/o female PMHx GERD, HLD, HTN, PVD s/p aortofem bypass, s/p RLE thromboembolectomy and R common emoral artery patch angioplasty 05/31/18, and tobacco abuse. Presents this admission with sepsis secondary to PNA and probable ischemic colitis.    4/14- flex sig  4/15- repeat CT revealed self contained perforation.NPO 4/16- exlap, LOA, L sigmoid colectomy w/ colostomy and 2x JPdrain placement, midline wound left open 4/17- NGT pulled out by pt 4/19 TPN start 4/24- full liquids 4/26- soft diet 5/4- TPN stopped   RD working remotely.  Spoke with RN via phone. Pt seen eating spaghetti this afternoon, only finished a few bites. Pt taking a few sips of Ensure throughout the day. Meal completions charted as 0-25% for pt's last eight meals (4 out of 8 charted as 0%). TPN has been discontinued.   PMT having ongoing conversations with family. When asked, pt dismisses thought of hospice and states she will try harder. Per surgery, she is not a candidate for Cortrak and TPN is not long term. Will continue to encourage PO intake and provide supplementation.   Weight noted to decrease from 62.7 kg on 5/1 to  59.9 kg today.   I/O: -2,603 ml since 4/23  UOP: 1,000 ml x 24 hrs   Medications: colace, folic acid, MVI with minerals, thiamine Labs: CBG 106-140   Diet Order:   Diet Order            Diet Heart Room service appropriate? Yes with Assist; Fluid consistency: Thin  Diet effective now              EDUCATION NEEDS:   Education needs have been addressed  Skin:  Skin Assessment: Skin Integrity Issues: Skin Integrity Issues:: Incisions, Other (Comment) Incisions: OPEN Surgical incision to R + L abdomen (closed at fascia) and to R groin Other: Venous statis ulcer to L toe  Last BM:  5/7- colostomy (no ml recorded)  Height:   Ht Readings from Last 1 Encounters:  07/14/18 5\' 7"  (1.702 m)    Weight:   Wt Readings from Last 1 Encounters:  08/06/18 59.9 kg    Ideal Body Weight:  61.4 kg  BMI:  Body mass index is 20.67 kg/m.  Estimated Nutritional Needs:   Kcal:  2000-2200 kcals (32-36 kcal/kg bw)  Protein:  112-125g Pro  (1.8-2g/kg bw)  Fluid:  2-2.2 L (91ml/kcal)  Vanessa Kick RD, LDN Clinical Nutrition Pager # [phone]

## 2018-08-06 NOTE — Care Management Important Message (Signed)
Important Message  Patient Details  Name: [name] MRN: [id number] Date of Birth: Nov 25, 1945   Medicare Important Message Given:  Yes    Dorena Bodo 08/06/2018, 2:20 PM

## 2018-08-06 NOTE — Progress Notes (Signed)
3 Days Post-Op  Subjective: No complaints. Very little intake. No abdominal pain, N/V. Having BM in colostomy bag.   Objective: Vital signs in last 24 hours: Temp:  [98.3 F (36.8 C)-98.9 F (37.2 C)] 98.6 F (37 C) (05/07 0519) Pulse Rate:  [66-72] 72 (05/06 1950) Resp:  [17-18] 18 (05/07 0519) BP: (122-127)/(41-63) 122/41 (05/07 0519) SpO2:  [93 %-97 %] 93 % (05/07 0519) Weight:  [59.9 kg] 59.9 kg (05/07 0519) Last BM Date: 08/06/18  Intake/Output from previous day: 05/06 0701 - 05/07 0700 In: 360 [P.O.:340; I.V.:20] Out: 1000 [Urine:1000] Intake/Output this shift: Total I/O In: -  Out: 1000 [Urine:1000]  PE: Gen: Alert, NAD, pleasant HEENT: EOM's intact, pupils equal and round Pulm: effort normal Skin: warm and dry Abd: Soft, NT/ND,abd binder in place. Open midline incision with fibrinous exudate at proximal portion and dehiscence of the entire wound, trace serous drainage, ostomy pinkwith a small area of clotted cephalic. This appears stable without active bleeding. There is air and stool in bag.  See picture below.      Lab Results:  No results for input(s): WBC, HGB, HCT, PLT in the last 72 hours. BMET No results for input(s): NA, K, CL, CO2, GLUCOSE, BUN, CREATININE, CALCIUM in the last 72 hours. PT/INR No results for input(s): LABPROT, INR in the last 72 hours. CMP     Component Value Date/Time   NA 135 08/03/2018 0426   NA 142 05/22/2018 1039   K 4.6 08/03/2018 0426   CL 107 08/03/2018 0426   CO2 20 (L) 08/03/2018 0426   GLUCOSE 150 (H) 08/03/2018 0426   BUN 17 08/03/2018 0426   BUN 10 05/22/2018 1039   CREATININE <0.30 (L) 08/03/2018 0426   CREATININE 0.72 08/07/2012 1016   CALCIUM 8.5 (L) 08/03/2018 0426   PROT 6.8 08/03/2018 0426   PROT 6.8 05/22/2018 1039   ALBUMIN 1.6 (L) 08/03/2018 0426   ALBUMIN 3.6 (L) 05/22/2018 1039   AST 199 (H) 08/03/2018 0426   ALT 164 (H) 08/03/2018 0426   ALKPHOS 257 (H) 08/03/2018 0426   BILITOT 0.5  08/03/2018 0426   BILITOT 0.3 05/22/2018 1039   GFRNONAA NOT CALCULATED 08/03/2018 0426   GFRNONAA 88 08/07/2012 1016   GFRAA NOT CALCULATED 08/03/2018 0426   GFRAA >89 08/07/2012 1016   Lipase     Component Value Date/Time   LIPASE 65 (H) 07/08/2018 1325       Studies/Results: No results found.  Anti-infectives: Anti-infectives (From admission, onward)   Start     Dose/Rate Route Frequency Ordered Stop   07/28/18 1000  fluconazole (DIFLUCAN) tablet 100 mg     100 mg Oral Daily 07/27/18 1219 08/04/18 0959   07/27/18 1400  fluconazole (DIFLUCAN) IVPB 200 mg     200 mg 100 mL/hr over 60 Minutes Intravenous  Once 07/27/18 1219 07/27/18 1426   07/15/18 1700  meropenem (MERREM) 1 g in sodium chloride 0.9 % 100 mL IVPB     1 g 200 mL/hr over 30 Minutes Intravenous Every 8 hours 07/15/18 1620 08/14/18 2359   07/08/18 2300  vancomycin (VANCOCIN) 1,500 mg in sodium chloride 0.9 % 500 mL IVPB  Status:  Discontinued     1,500 mg 250 mL/hr over 120 Minutes Intravenous  Once 07/07/18 2128 07/07/18 2129   07/08/18 2300  vancomycin (VANCOCIN) 1,500 mg in sodium chloride 0.9 % 500 mL IVPB  Status:  Discontinued     1,500 mg 250 mL/hr over 120 Minutes Intravenous Every  24 hours 07/07/18 2129 07/08/18 0726   07/08/18 0600  ceFEPIme (MAXIPIME) 2 g in sodium chloride 0.9 % 100 mL IVPB  Status:  Discontinued     2 g 200 mL/hr over 30 Minutes Intravenous Every 8 hours 07/07/18 2124 07/13/18 1333   07/07/18 2130  vancomycin (VANCOCIN) 1,500 mg in sodium chloride 0.9 % 500 mL IVPB     1,500 mg 250 mL/hr over 120 Minutes Intravenous  Once 07/07/18 2123 07/08/18 0028   07/07/18 2115  vancomycin (VANCOCIN) IVPB 1000 mg/200 mL premix  Status:  Discontinued     1,000 mg 200 mL/hr over 60 Minutes Intravenous  Once 07/07/18 2110 07/07/18 2128   07/07/18 2115  ceFEPIme (MAXIPIME) 2 g in sodium chloride 0.9 % 100 mL IVPB     2 g 200 mL/hr over 30 Minutes Intravenous  Once 07/07/18 2110 07/07/18 2203        Assessment/Plan A. 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 8.7 on 5/4 Anemia - Hgb 5.8 5/4. 8.9 on 5/4 after PRBC. VSS Code status DNR  Suspected chronic mesenteric ischemia Descending colon perforation on CT s/p Ex Lap, LOA, Left sigmoid colectomy, descending colostomy (Hartmann's), Dr. Derrell Lolling, 4/16POD #21 -TID WTD dressing changesto help with necrotic tissue at base of wound -cont onHHdietand ensure although patientcontinues to have poor oral intake. TPN d/c'd 5/4 -colostomy functioning.Small amount of clotted bloodon stoma cephalic. No active bleeding. Monitor. -mobilize and pulm toilet -AttemptedCortrak placement for TFs but patient vomited a blood clot 5/1. 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 Given new information, we will defer on Cortrak placement to avoid complications given low integrity of stomach mucosa. Pt underwent aortogram yesterday by vascular. 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 TPN d/c 5/4. Patient has poor intake. Unfortunately, has not been progressing well since surgery and does not have a reliable long term source of nutrition. No good surgical options at this time to improve her status. I discussed with the daughter [name] over the phone. Per palliative's note, family would like to pursue home hospice but patient would not. They will continue discussion.   FEN-HHdiet/ensure VTE -SCDs, heparindripon hold due to ABL  anemia. Can restart from a surgical standpoint if repeat Hgb stable.  ID -currentlyMeropenem 4/15per ID>>, diflucan 4/27>> Foley - Removed POD 1 Follow up -Dr. Derrell Lolling POC: [name] [phone] daughter/Cynthia Mayford Knife [phone].  Plan: Appreciate palliative assistance with further goals of care discussions   LOS: 30 days    Jacinto Halim , [facility] 08/06/2018, 10:17 AM Pager: [phone]

## 2018-08-07 ENCOUNTER — Telehealth: Payer: Self-pay | Admitting: Family

## 2018-08-07 LAB — TYPE AND SCREEN
ABO/RH(D): A NEG
Antibody Screen: POSITIVE
DAT, IgG: POSITIVE
Unit division: 0
Unit division: 0
Unit division: 0
Unit division: 0

## 2018-08-07 LAB — BPAM RBC
Blood Product Expiration Date: 202005272359
Blood Product Expiration Date: 202005272359
Blood Product Expiration Date: 202005292359
Blood Product Expiration Date: 202005292359
Unit Type and Rh: 600
Unit Type and Rh: 600
Unit Type and Rh: 600
Unit Type and Rh: 600

## 2018-08-07 NOTE — Progress Notes (Signed)
Per Lucrezia Europe with [facility]- referral has been received and they are working on DME needs with family- DME is scheduled to be delivered tonight 08/07/18- once DME has been confirmed as delivered- Hospice will be ready on their end to arrange RN visit for transition home- will need to call daughter [name] in am to confirm delivery of DME. When pt is discharged will need to notify on call Hospice RN for [facility] (weekends)- at either - [phone] or [phone] and fax d/c summary to [phone].

## 2018-08-07 NOTE — Progress Notes (Signed)
Sacral dressing changed.  Colostomy bag burped.  Will continue to monitor.

## 2018-08-07 NOTE — TOC Progression Note (Signed)
Transition of Care (TOC) - Progression Note  Donn Pierini RN, BSN Transitions of Care Unit 4E- RN Case Manager [phone]  Patient Details  Name: [name] MRN: [id number] Date of Birth: Mar 31, 1946  Transition of Care [facility]) CM/SW Contact  Zenda Alpers, Lenn Sink, RN Phone Number: 08/07/2018, 2:01 PM  Clinical Narrative:    Referral for home hospice needs received- CM spoke with pt at bedside, confirmed with pt decision to return home with hospice- pt provided list for hospice agencies in [facility] and copy placed in shadow chart- per pt she wants to use [facility] [place] but also request CM to call daughter, per pt she feels she can travel home via car will speak with daughter regarding this. TC made to [name], discussed home hospice and confirmed choice for [facility]. Discussed DME needs- pt has RW , however daughter requesting hospital bed and BSC. Family will plan to transport via private car, pt will need GOLD DNR for transport. Address confirmed with daughter. Referral called to [facility] spoke with Ron. Will await confirmation of referral- and delivery of DME needs to home. Plan for transition to home as soon as Hospice services confirmed. Possibly over weekend.    Expected Discharge Plan: Home w Hospice Care Barriers to Discharge: Barriers Unresolved (comment)(awaiting hospice approval for home hospice)  Expected Discharge Plan and Services Expected Discharge Plan: Home w Hospice Care   Discharge Planning Services: CM Consult Post Acute Care Choice: Hospice Living arrangements for the past 2 months: Skilled Nursing Facility, Single Family Home                           HH Arranged: (hospice) [facility] Agency: [facility] Date [facility] Agency Contacted: 08/07/18 Time HH Agency Contacted: [phone] Representative spoke with at [facility] Agency: Ron   Social Determinants of Health (SDOH) Interventions    Readmission Risk  Interventions No flowsheet data found.

## 2018-08-07 NOTE — Progress Notes (Signed)
Daily Progress Note   Patient Name: [name]       Date: 08/07/2018 DOB: [birth year]  Age: 73 y.o. MRN#: [id number] Attending Physician: Danielle Kief, MD Primary Care Physician: Danielle Spencer, [name] Admit Date: 07/07/2018  Reason for Consultation/Follow-up: Establishing goals of care  Subjective: Patient sitting in bed on entering room.  She reports being in better spirits and that she was able to walk 200 feet with PT yesterday.  Discussed again regarding concerns about her nutrition and functional status.  She reports that she is hopeful to be able to go home with hospice support in the next day or two.   I called and discussed with her daughter, [name].    Length of Stay: 31  Current Medications: Scheduled Meds:  . sodium chloride   Intravenous Once  . Chlorhexidine Gluconate Cloth  6 each Topical Q0600  . docusate sodium  100 mg Oral BID  . feeding supplement (ENSURE ENLIVE)  237 mL Oral TID BM  . folic acid  1 mg Oral Daily  . lidocaine  1 patch Transdermal Q24H  . magic mouthwash  10 mL Oral QID  . metoprolol tartrate  75 mg Oral BID  . multivitamin with minerals  1 tablet Oral Daily  . nicotine  21 mg Transdermal Daily  . pantoprazole  40 mg Oral Daily  . pregabalin  50 mg Oral BID  . saccharomyces boulardii  250 mg Oral BID  . sodium chloride flush  10-40 mL Intracatheter Q12H  . sodium chloride flush  3 mL Intravenous Q12H  . thiamine  100 mg Oral Daily    Continuous Infusions: . sodium chloride 10 mL/hr at 07/22/18 2300  . sodium chloride    . meropenem (MERREM) IV 1 g (08/07/18 1348)  . methocarbamol (ROBAXIN) IV      PRN Meds: sodium chloride, sodium chloride, acetaminophen, alum & mag hydroxide-simeth, hydrALAZINE, ipratropium-albuterol, labetalol,  methocarbamol (ROBAXIN) IV, morphine injection, ondansetron (ZOFRAN) IV, oxyCODONE, phenol, promethazine, sodium chloride flush, sodium chloride flush  Physical Exam         NAD Sleeping in chair but awakens easily Has colostomy Trace edema Non focal Regular S1 S 2  Vital Signs: BP 130/65 (BP Location: Left Arm)   Pulse 71   Temp 98.4 F (36.9 C) (Oral)  Resp 18   Ht 5\' 7"  (1.702 m)   Wt 59.5 kg   SpO2 95%   BMI 20.54 kg/m  SpO2: SpO2: 95 % O2 Device: O2 Device: Room Air O2 Flow Rate: O2 Flow Rate (L/min): 2 L/min  Intake/output summary:   Intake/Output Summary (Last 24 hours) at 08/07/2018 1854 Last data filed at 08/07/2018 1421 Gross per 24 hour  Intake 120 ml  Output 400 ml  Net -280 ml   LBM: Last BM Date: 08/07/18 Baseline Weight: Weight: 74.8 kg Most recent weight: Weight: 59.5 kg       Palliative Assessment/Data:    Flowsheet Rows     Most Recent Value  Intake Tab  Referral Department  Hospitalist  Unit at Time of Referral  Cardiac/Telemetry Unit  Date Notified  07/30/18  Palliative Care Type  New Palliative care  Reason for referral  Clarify Goals of Care  Date of Admission  07/07/18  Date first seen by Palliative Care  07/31/18  # of days Palliative referral response time  1 Day(s)  # of days IP prior to Palliative referral  23  Clinical Assessment  Psychosocial & Spiritual Assessment  Palliative Care Outcomes      Patient Active Problem List   Diagnosis Date Noted  . Palliative care by specialist   . Goals of care, counseling/discussion   . Bowel perforation (HCC) 07/16/2018  . Lower abdominal pain   . Abnormal CT scan, colon   . Abnormal liver function   . Loss of weight   . Nausea without vomiting   . Sepsis (HCC) 07/07/2018  . Severe protein-calorie malnutrition (HCC) 07/07/2018  . Contusion of right hip   . COPD with acute exacerbation (HCC)   . General weakness 05/28/2018  . Fall 05/28/2018  . UTI (urinary tract infection)  05/28/2018  . Pancreatitis 05/28/2018  . Lumbar radiculopathy 07/30/2016  . Depression 07/24/2015  . S/P lumbar fusion 01/27/2015  . GAD (generalized anxiety disorder) 06/13/2014  . Neck pain 07/14/2012  . S/P lumbar spinal fusion 07/14/2012  . Lumbar stenosis with neurogenic claudication 06/11/2012  . DDD (degenerative disc disease), lumbosacral 04/24/2012  . Spondylolisthesis at L5-S1 level 04/24/2012  . Hyperlipidemia with target LDL less than 100 06/16/2009  . TOBACCO ABUSE 06/16/2009  . Essential hypertension 06/16/2009  . Peripheral vascular disease (HCC) 06/16/2009  . GERD 06/16/2009  . Chronic back pain 06/16/2009  . CAROTID BRUIT 05/23/2009    Palliative Care Assessment & Plan   Patient Profile:    Assessment:  73 yo lady who has suspected chronic mesenteric ischemia, descending colon perforation, A fib, PVD/ mesenteric ischemia. 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  Remains on TNA Cortrak not able to be placed and enteric feeds unable to be initiated due to concern for ulcerative gastritis with concern for ischemia.   Recommendations/Plan: Patient lying in bed on entering room.  Discussed again regarding her goal of returning home and spending time with family.   Ms. [name] reports today that she is set on returning home.  She tells me she is agreeable home hospice on discharge to support her for transition home.    Discussed with daughter, Danielle Relicammy.  She reports that family wants to support her in goal of returning home and [facility] would [name].  Order placed for case mgt to facilitate.  Will need to complete IV abx or transition to oral to complete course prior to d/c.  Code Status:    Code Status Orders  (From admission, onward)         Start     Ordered   07/30/18 1357  Do not attempt resuscitation (DNR)  Continuous    Question Answer Comment  In the event of cardiac or respiratory  ARREST Do not call a "code blue"   In the event of cardiac or respiratory ARREST Do not perform Intubation, CPR, defibrillation or ACLS   In the event of cardiac or respiratory ARREST Use medication by any route, position, wound care, and other measures to relive pain and suffering. May use oxygen, suction and manual treatment of airway obstruction as needed for comfort.      07/30/18 1357        Code Status History    Date Active Date Inactive Code Status Order ID Comments User Context   07/07/2018 2335 07/30/2018 1357 Full Code [id number]  Pearson Grippe, MD ED   05/28/2018 0119 06/08/2018 1936 Full Code [id number]  Bobette Mo, MD ED       Prognosis:  ? Weeks with her limited nutrition and hydration?  Discharge Planning: Home with Hospice when medically ready  Care plan was discussed with  Patient, patient's daughter [name]) on the phone.   Thank you for allowing the Palliative Medicine Team to assist in the care of this patient.   Total Time 30 Prolonged Time Billed  no       Greater than 50%  of this time was spent counseling and coordinating care related to the above assessment and plan.  Romie Minus, MD Please contact Palliative Medicine Team phone at [phone] for questions and concerns.

## 2018-08-07 NOTE — Progress Notes (Signed)
4 Days Post-Op  Subjective: No complaints. Still very little intake yesterday. No abdominal pain, N/V. Passing flatus and BM in colostomy. Wants to go home.   Objective: Vital signs in last 24 hours: Temp:  [98.4 F (36.9 C)-99.3 F (37.4 C)] 98.4 F (36.9 C) (05/08 0429) Pulse Rate:  [76-77] 76 (05/08 0429) Resp:  [18-21] 19 (05/08 0429) BP: (129-150)/(62-63) 129/63 (05/08 0429) SpO2:  [91 %-97 %] 91 % (05/08 0429) Weight:  [59.5 kg] 59.5 kg (05/08 0429) Last BM Date: 08/06/18  Intake/Output from previous day: 05/07 0701 - 05/08 0700 In: 120 [P.O.:100; I.V.:20] Out: 1000 [Urine:1000] Intake/Output this shift: No intake/output data recorded.  PE: Gen: Alert, NAD, pleasant HEENT: EOM's intact, pupils equal and round Pulm: effort normal Skin: warm and dry Abd: Soft, NT/ND,abd binder in place. Open midline incision with fibrinous exudate at proximal portion. This appears to have liquifed overnight, and there was a collection of fluid at base of wound. After cleaning, no further drainage. Fibrinous exudate has decreased in size. Dehiscence of the entire wound, trace serous drainage, ostomy pinkwith a small area of clotted cephalic.This appears stable without active bleeding. There is air and stool in bag. See picture below.     Lab Results:  No results for input(s): WBC, HGB, HCT, PLT in the last 72 hours. BMET No results for input(s): NA, K, CL, CO2, GLUCOSE, BUN, CREATININE, CALCIUM in the last 72 hours. PT/INR No results for input(s): LABPROT, INR in the last 72 hours. CMP     Component Value Date/Time   NA 135 08/03/2018 0426   NA 142 05/22/2018 1039   K 4.6 08/03/2018 0426   CL 107 08/03/2018 0426   CO2 20 (L) 08/03/2018 0426   GLUCOSE 150 (H) 08/03/2018 0426   BUN 17 08/03/2018 0426   BUN 10 05/22/2018 1039   CREATININE <0.30 (L) 08/03/2018 0426   CREATININE 0.72 08/07/2012 1016   CALCIUM 8.5 (L) 08/03/2018 0426   PROT 6.8 08/03/2018 0426   PROT  6.8 05/22/2018 1039   ALBUMIN 1.6 (L) 08/03/2018 0426   ALBUMIN 3.6 (L) 05/22/2018 1039   AST 199 (H) 08/03/2018 0426   ALT 164 (H) 08/03/2018 0426   ALKPHOS 257 (H) 08/03/2018 0426   BILITOT 0.5 08/03/2018 0426   BILITOT 0.3 05/22/2018 1039   GFRNONAA NOT CALCULATED 08/03/2018 0426   GFRNONAA 88 08/07/2012 1016   GFRAA NOT CALCULATED 08/03/2018 0426   GFRAA >89 08/07/2012 1016   Lipase     Component Value Date/Time   LIPASE 65 (H) 07/08/2018 1325       Studies/Results: No results found.  Anti-infectives: Anti-infectives (From admission, onward)   Start     Dose/Rate Route Frequency Ordered Stop   07/28/18 1000  fluconazole (DIFLUCAN) tablet 100 mg     100 mg Oral Daily 07/27/18 1219 08/04/18 0959   07/27/18 1400  fluconazole (DIFLUCAN) IVPB 200 mg     200 mg 100 mL/hr over 60 Minutes Intravenous  Once 07/27/18 1219 07/27/18 1426   07/15/18 1700  meropenem (MERREM) 1 g in sodium chloride 0.9 % 100 mL IVPB     1 g 200 mL/hr over 30 Minutes Intravenous Every 8 hours 07/15/18 1620 08/14/18 2359   07/08/18 2300  vancomycin (VANCOCIN) 1,500 mg in sodium chloride 0.9 % 500 mL IVPB  Status:  Discontinued     1,500 mg 250 mL/hr over 120 Minutes Intravenous  Once 07/07/18 2128 07/07/18 2129   07/08/18 2300  vancomycin (VANCOCIN)  1,500 mg in sodium chloride 0.9 % 500 mL IVPB  Status:  Discontinued     1,500 mg 250 mL/hr over 120 Minutes Intravenous Every 24 hours 07/07/18 2129 07/08/18 0726   07/08/18 0600  ceFEPIme (MAXIPIME) 2 g in sodium chloride 0.9 % 100 mL IVPB  Status:  Discontinued     2 g 200 mL/hr over 30 Minutes Intravenous Every 8 hours 07/07/18 2124 07/13/18 1333   07/07/18 2130  vancomycin (VANCOCIN) 1,500 mg in sodium chloride 0.9 % 500 mL IVPB     1,500 mg 250 mL/hr over 120 Minutes Intravenous  Once 07/07/18 2123 07/08/18 0028   07/07/18 2115  vancomycin (VANCOCIN) IVPB 1000 mg/200 mL premix  Status:  Discontinued     1,000 mg 200 mL/hr over 60 Minutes  Intravenous  Once 07/07/18 2110 07/07/18 2128   07/07/18 2115  ceFEPIme (MAXIPIME) 2 g in sodium chloride 0.9 % 100 mL IVPB     2 g 200 mL/hr over 30 Minutes Intravenous  Once 07/07/18 2110 07/07/18 2203       Assessment/Plan A. 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 8.7 on 5/4 Anemia - Hgb 5.85/4. 8.9on 5/4after PRBC. VSS Code status DNR  Suspected chronic mesenteric ischemia Descending colon perforation on CT s/p Ex Lap, LOA, Left sigmoid colectomy, descending colostomy (Hartmann's), Dr. Derrell Lollingamirez, 4/16POD #22 -TID WTD dressing changesto help with necrotic tissue at base of wound -cont onHHdietand ensure although patientcontinues to have poor oral intake. TPN d/c'd 5/4 -colostomy functioning.Small amount of clotted bloodon stomacephalic. No active bleeding. Monitor. -mobilize and pulm toilet -AttemptedCortrak placement for TFs but patient vomited a blood clot 5/1. 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 Given new information, we will defer on Cortrak placement to avoid complications given low integrity of stomach mucosa. Pt underwent aortogram yesterday by vascular. 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 TPN d/c5/4. Patient has poor intake. Unfortunately, has not been progressing well since surgery and does not have a reliable long term source of nutrition. No good surgical options at this time to improve her status.I discussed with the daughter [name]  over the phone. Per palliative's note, family would like to pursue home hospice. Pt is set on going home and will consider home hospice on discharge. They will have further discussions today.   FEN-Regulardiet/ensure VTE -SCDs, heparindripon hold due to ABL anemia. Can restart from a surgical standpoint if repeat Hgb stable. ID - diflucan 4/27-5/5. CurrentlyMeropenem 4/15per ID>> Foley - Removed POD 1 Follow up -Dr. Derrell Lollingamirez POC: Dellis Filbertammy Kallam [phone] daughter/[name] [phone].  Plan: Appreciate palliative assistance with further goals of care discussions   LOS: 31 days    Jacinto HalimMichael M Maczis , [facility] 08/07/2018, 10:16 AM Pager: [phone]

## 2018-08-07 NOTE — Progress Notes (Signed)
PROGRESS NOTE    [name]  ZOX:[id number] DOB: 05/30/1945 DOA: 07/07/2018 PCP: Junie Spencer, [name]    Brief Narrative:  73 year old female with history of chronic N/V/abdominal pain, A. fib, PVD,hypertension and chronic back pain presented with intractable nausea, vomiting, abdominal pain, new onset of diarrhea and about 30 pounds weight loss in the last several months.  Admitted to [facility]  on 07/07/2018, -CTA abdomen and pelvis on 4/9 showed occlusion of celiac axis, or opacification of SMA and right iliac limb, patent SMV, colonic wall thickenings, multiple fluid collections. -Patient was transferred to [facility] for further management. Patient was also treated with cefepime for presumed pneumonia. Gastroenterology and vascular surgery consulted. 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 She also had flexible sigmoidoscopy that showed only a 7 mm sessile polyp in the rectum but poor prep. -On 4/15-worsened clinically, repeat CT abdomen and pelvis which showed frank perforation of the descending colon appears to be contained within a portion of the omentum -General surgery and IR consulted. -Drain placement by IR, and ex lap, LOA, left sigmoid colectomy with descending colostomy (Hartmann's) by general surgery, Dr. Derrell Lolling on 4/16. Abdomen left open out of concern for infectious process and ileus. Started on TPN on 07/18/2018.   -Continue to have minimal oral intake and failure to thrive -Palliative care consulted and following, family understands poor prognosis -5/4 underwent mesenteric angiogram: Noted chronic celiac artery occlusion with no options for revascularization -Palliative care discussions ongoing  Assessment & Plan:   Principal Problem:   Lower abdominal pain Active Problems:   Hyperlipidemia with target LDL less than 100   TOBACCO ABUSE   Essential hypertension   Peripheral vascular disease (HCC)   Sepsis (HCC)   Severe protein-calorie malnutrition (HCC)   Abnormal liver function   Loss of weight   Nausea without vomiting   Abnormal CT scan, colon   Bowel perforation (HCC)   Palliative care by specialist   Goals of care, counseling/discussion  Contained perforated descending colon/ chronic mesenteric ischemia -S/p IR drain placement on 4/16 which showed a large left colon defect,  -4/16 S/p Ex Lap, LOA, Left sigmoid colectomy, descending colostomy (Hartmann's), Dr. Derrell Lolling -has multiple intra abdominal abscesses -suspected to have a possible stomach perforation as well -Postop ileus-resolved, but very poor Po intake, atleast in part from ischemic stomach -open wound->Continue wet-to-dry dressings, general surgery following -Supported with TNA since 4/18 -Continue meropenem 4/15--per ID.  Dr. Allena Katz discussed with ID, 4 weeks therapy recommended. Discussed with ID. OK to complete course with augmentin if discharged with hospice -Vascular following, underwent mesenteric angiogram 5/4 noted chronic occlusion of celiac artery, 50% SMA occlusion, celiac artery occlusion not amenable for percutaneous intervention, not a candidate for operative reconstruction due to overall debility -Patient's oral intake remains very poor, ongoing failure to thrive, very poor chances of meaningful recovery especially in light of chronic intestinal ischemia and ischemic stomach with no revascularization options. -Family understands poor prognosis, TNA was stopped 5/4, continue goals of care discussions with possible consideration of hospice -Appreciate input by Palliative Care. Plan for home with hospice over the weekend.   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  - from occluded celiac axis -Continue PPI as tolerated -mesenteric angiogram as above  -Presently stable  Significant weight loss/moderate protein calorie malnutrition: -Resulting from  chronic bowel ischemia -Was on TPN for 2 weeks, discontinued 5/4 -continue to encourage PO, pt still with minimal po intake  History of COPD:  -Continue DuoNeb as needed -Currently stable   Peripheral vascular disease with dry gangrene: Stable. -Continue home statin as tolerated -was on therapeutic anticoagulation with IV heparin per pharmacy, heparin was held following recurrent upper GI bleed -Currently stable  Essential hypertension:  -Continue home meds as tolerated -BP stable at this time  Chronic pain: -Lower narcotics and Lyrica dose due to oversedation  Tobacco use disorder: -Cessation counseling -Continue Nicotine patch.  -Currently stable  Oral thrush. -Treated with with Diflucan.  Acute toxic/metabolic encephalopathy -Resolved, due to polypharmacy -Decreased narcotics and Lyrica dose  DVT prophylaxis: SCD's Code Status: DNR Family Communication: Pt in room, family not at bedside Disposition Plan: Uncertain at this time  Consultants:   Vascular surgery  Gastroenterology  Infectious disease  Interventional radiology  General surgery  Procedures:   EGD on 4/14  Flex sigmoidoscopy on 4/14  IR drain placement on 4/16 which showed a large left colon defect, 4/16  Ex Lap, LOA, Left sigmoid colectomy, descending colostomy (Hartmann's), Dr. Derrell Lolling, 4/16  Antimicrobials: Anti-infectives (From admission, onward)   Start     Dose/Rate Route Frequency Ordered Stop   07/28/18 1000  fluconazole (DIFLUCAN) tablet 100 mg     100 mg Oral Daily 07/27/18 1219 08/04/18 0959   07/27/18 1400  fluconazole (DIFLUCAN) IVPB 200 mg     200 mg 100 mL/hr over 60 Minutes Intravenous  Once 07/27/18 1219 07/27/18 1426   07/15/18 1700  meropenem (MERREM) 1 g in sodium chloride 0.9 % 100 mL IVPB     1 g 200 mL/hr over 30 Minutes Intravenous Every 8 hours  07/15/18 1620 08/14/18 2359   07/08/18 2300  vancomycin (VANCOCIN) 1,500 mg in sodium chloride 0.9 % 500 mL IVPB  Status:  Discontinued     1,500 mg 250 mL/hr over 120 Minutes Intravenous  Once 07/07/18 2128 07/07/18 2129   07/08/18 2300  vancomycin (VANCOCIN) 1,500 mg in sodium chloride 0.9 % 500 mL IVPB  Status:  Discontinued     1,500 mg 250 mL/hr over 120 Minutes Intravenous Every 24 hours 07/07/18 2129 07/08/18 0726   07/08/18 0600  ceFEPIme (MAXIPIME) 2 g in sodium chloride 0.9 % 100 mL IVPB  Status:  Discontinued     2 g 200 mL/hr over 30 Minutes Intravenous Every 8 hours 07/07/18 2124 07/13/18 1333   07/07/18 2130  vancomycin (VANCOCIN) 1,500 mg in sodium chloride 0.9 % 500 mL IVPB     1,500 mg 250 mL/hr over 120 Minutes Intravenous  Once 07/07/18 2123 07/08/18 0028   07/07/18 2115  vancomycin (VANCOCIN) IVPB 1000 mg/200 mL premix  Status:  Discontinued     1,000 mg 200 mL/hr over 60 Minutes Intravenous  Once 07/07/18 2110 07/07/18 2128   07/07/18 2115  ceFEPIme (MAXIPIME) 2 g in sodium chloride 0.9 % 100 mL IVPB     2 g 200 mL/hr over 30 Minutes Intravenous  Once 07/07/18 2110 07/07/18 2203      Subjective: Denies complaints and claims to be eating, however minimal PO intake noted  Objective: Vitals:   08/06/18 2023 08/07/18 0429 08/07/18 0900 08/07/18 1420  BP: (!) 144/62 129/63 (!) 133/58 130/65  Pulse: 77 76 71 71  Resp: (!) 21 19  18   Temp: 98.5 F (36.9 C) 98.4 F (36.9 C) 98.3 F (36.8 C) 98.4 F (36.9 C)  TempSrc:  Oral Oral Oral Oral  SpO2: 97% 91% 97% 95%  Weight:  59.5 kg    Height:        Intake/Output Summary (Last 24 hours) at 08/07/2018 1506 Last data filed at 08/07/2018 1421 Gross per 24 hour  Intake 120 ml  Output 400 ml  Net -280 ml   Filed Weights   08/05/18 0553 08/06/18 0519 08/07/18 0429  Weight: 60.3 kg 59.9 kg 59.5 kg    Examination: General exam: Conversant, in no acute distress Respiratory system: normal chest rise, clear, no  audible wheezing Cardiovascular system: regular rhythm, s1-s2 Gastrointestinal system: Nondistended, nontender, pos BS Central nervous system: No seizures, no tremors Extremities: No cyanosis, no joint deformities Skin: No rashes, no pallor Psychiatry: Affect normal // no auditory hallucinations   Data Reviewed: I have personally reviewed following labs and imaging studies  CBC: Recent Labs  Lab 08/01/18 0429 08/03/18 0426 08/03/18 0757  WBC 11.1* 11.1* 8.7  NEUTROABS  --  5.7 4.7  HGB 8.0* 5.8* 8.9*  HCT 26.1* 19.0* 28.3*  MCV 82.6 83.7 82.5  PLT 390 477* 427*   Basic Metabolic Panel: Recent Labs  Lab 08/01/18 0429 08/03/18 0426  NA 135 135  K 4.3 4.6  CL 108 107  CO2 20* 20*  GLUCOSE 107* 150*  BUN 16 17  CREATININE 0.45 <0.30*  CALCIUM 8.3* 8.5*  MG 2.0 2.1  PHOS  --  4.3   GFR: CrCl cannot be calculated (This lab value cannot be used to calculate CrCl because it is not a number: <0.30). Liver Function Tests: Recent Labs  Lab 08/03/18 0426  AST 199*  ALT 164*  ALKPHOS 257*  BILITOT 0.5  PROT 6.8  ALBUMIN 1.6*   No results for input(s): LIPASE, AMYLASE in the last 168 hours. No results for input(s): AMMONIA in the last 168 hours. Coagulation Profile: Recent Labs  Lab 08/03/18 0426  INR 1.3*   Cardiac Enzymes: No results for input(s): CKTOTAL, CKMB, CKMBINDEX, TROPONINI in the last 168 hours. BNP (last 3 results) No results for input(s): PROBNP in the last 8760 hours. HbA1C: No results for input(s): HGBA1C in the last 72 hours. CBG: Recent Labs  Lab 08/03/18 0812 08/03/18 1144 08/03/18 1606 08/03/18 2121 08/04/18 0624  GLUCAP 113* 129* 117* 100* 114*   Lipid Profile: No results for input(s): CHOL, HDL, LDLCALC, TRIG, CHOLHDL, LDLDIRECT in the last 72 hours. Thyroid Function Tests: No results for input(s): TSH, T4TOTAL, FREET4, T3FREE, THYROIDAB in the last 72 hours. Anemia Panel: No results for input(s): VITAMINB12, FOLATE, FERRITIN,  TIBC, IRON, RETICCTPCT in the last 72 hours. Sepsis Labs: No results for input(s): PROCALCITON, LATICACIDVEN in the last 168 hours.  No results found for this or any previous visit (from the past 240 hour(s)).   Radiology Studies: No results found.  Scheduled Meds: . sodium chloride   Intravenous Once  . Chlorhexidine Gluconate Cloth  6 each Topical Q0600  . docusate sodium  100 mg Oral BID  . feeding supplement (ENSURE ENLIVE)  237 mL Oral TID BM  . folic acid  1 mg Oral Daily  . lidocaine  1 patch Transdermal Q24H  . magic mouthwash  10 mL Oral QID  . metoprolol tartrate  75 mg Oral BID  . multivitamin with minerals  1 tablet Oral Daily  . nicotine  21 mg Transdermal Daily  . pantoprazole  40 mg Oral Daily  . pregabalin  50 mg Oral BID  . saccharomyces boulardii  250 mg Oral  BID  . sodium chloride flush  10-40 mL Intracatheter Q12H  . sodium chloride flush  3 mL Intravenous Q12H  . thiamine  100 mg Oral Daily   Continuous Infusions: . sodium chloride 10 mL/hr at 07/22/18 2300  . sodium chloride    . meropenem (MERREM) IV 1 g (08/07/18 1348)  . methocarbamol (ROBAXIN) IV       LOS: 31 days   Rickey Barbara, MD [facility] Pager On Amion  If 7PM-7AM, please contact night-coverage 08/07/2018, 3:06 PM

## 2018-08-08 DIAGNOSIS — K255 Chronic or unspecified gastric ulcer with perforation: Secondary | ICD-10-CM

## 2018-08-08 MED ORDER — AMOXICILLIN-POT CLAVULANATE 875-125 MG PO TABS: 1.0000 | ORAL_TABLET | Freq: Two times a day (BID) | ORAL | 0 refills | Status: AC

## 2018-08-08 MED ORDER — OXYCODONE HCL 5 MG PO TABS: 5.0000 mg | ORAL_TABLET | ORAL | 0 refills | Status: AC | PRN

## 2018-08-08 MED ORDER — SACCHAROMYCES BOULARDII 250 MG PO CAPS: 250.0000 mg | ORAL_CAPSULE | Freq: Two times a day (BID) | ORAL | 0 refills | Status: AC

## 2018-08-08 MED ORDER — DOCUSATE SODIUM 100 MG PO CAPS: 100.0000 mg | ORAL_CAPSULE | Freq: Two times a day (BID) | ORAL | 0 refills | Status: AC

## 2018-08-08 MED ORDER — PANTOPRAZOLE SODIUM 40 MG PO TBEC: 40.0000 mg | DELAYED_RELEASE_TABLET | Freq: Every day | ORAL | 0 refills | Status: AC

## 2018-08-08 MED ORDER — METOPROLOL TARTRATE 75 MG PO TABS: 75.0000 mg | ORAL_TABLET | Freq: Two times a day (BID) | ORAL | 0 refills | Status: AC

## 2018-08-08 NOTE — Discharge Summary (Signed)
Physician Discharge Summary  [name] AVW:[id number] DOB: 19-May-1945 DOA: 07/07/2018  PCP: Junie Spencer, [name]  Admit date: 07/07/2018 Discharge date: 08/08/2018  Admitted From: Home Disposition:  Home  Recommendations for Outpatient Follow-up:  1. Follow up with hospice services  Discharge Condition:Stable CODE STATUS:DNR Diet recommendation: Regular as tolerated   Brief/Interim Summary: 73 year old female with history of chronic N/V/abdominal pain, A. fib, PVD,hypertension and chronic back pain presented with intractable nausea, vomiting, abdominal pain, new onset of diarrhea and about 30 pounds weight loss in the last several months.  Admitted to [facility] on 07/07/2018, -CTA abdomen and pelvis on 4/9 showed occlusion of celiac axis, or opacification of SMA and right iliac limb, patent SMV, colonic wall thickenings, multiple fluid collections. -Patient was transferred to [facility] for further management. Patient was also treated with cefepime for presumed pneumonia. Gastroenterology and vascular surgery consulted. 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 She also had flexible sigmoidoscopy that showed only a 7 mm sessile polyp in the rectum but poor prep. -On 4/15-worsened clinically,repeat CT abdomen and pelvis which showed frank perforation of the descending colon appears to be contained within a portion of the omentum -General surgery and IR consulted. -Drain placement by IR, and ex lap, LOA, left sigmoid colectomy with descending colostomy (Hartmann's) by general surgery, Dr. Derrell Lolling on 4/16. Abdomen left open out of concern for infectious process and ileus. Started on TPN on 07/18/2018. -Continue to have minimal oral intake and failure to thrive -Palliative care consulted and following,family understands poor prognosis -5/4underwent mesenteric angiogram:Noted chronic celiac artery occlusion with no options  for revascularization  Discharge Diagnoses:  Principal Problem:   Lower abdominal pain Active Problems:   Hyperlipidemia with target LDL less than 100   TOBACCO ABUSE   Essential hypertension   Peripheral vascular disease (HCC)   Sepsis (HCC)   Severe protein-calorie malnutrition (HCC)   Abnormal liver function   Loss of weight   Nausea without vomiting   Abnormal CT scan, colon   Bowel perforation (HCC)   Palliative care by specialist   Goals of care, counseling/discussion   Contained perforated descending colon/ chronic mesenteric ischemia -S/p IR drain placement on 4/16 which showed a large left colon defect,  -4/16 S/p Ex Lap, LOA, Left sigmoid colectomy, descending colostomy (Hartmann's), Dr. Derrell Lolling -has multiple intra abdominal abscesses -suspected to have a possible stomach perforation as well -Postop ileus-resolved, but very poor Po intake, atleast in part from ischemic stomach -open wound->Continue wet-to-dry dressings, general surgery following -Supported with TNA since 4/18 -Continued meropenem 4/15--per ID.Dr. Allena Katz discussed with ID, 4 weeks therapy recommended. Discussed with ID. OK to complete course with augmentin if discharged with hospice. 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 -Family understands poor prognosis, TNA was stopped 5/4, continue goals of care discussions with possible consideration of hospice -Appreciate input by Palliative Care. 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 -from  occluded celiac axis -Continue PPI as tolerated -mesenteric angiogram as above -Presently stable  Significant weight loss/moderate protein calorie malnutrition: -Resulting from chronic bowel ischemia -Was on TPN for 2 weeks, discontinued 5/4 -continue to encourage PO, pt still with minimal po intake  History of COPD:  -Continue DuoNeb as needed -Currently stable   Peripheral vascular disease with dry gangrene: Stable. -Continue home statin as tolerated -was on therapeutic anticoagulation with IV heparin per pharmacy, heparin was held following recurrent upper GI bleed -Currently stable  Essential hypertension:  -Continue home meds as tolerated -BP stable at this time  Chronic pain: -Lower narcotics and Lyrica dose due to oversedation  Tobacco use disorder: -Cessation counseling -Continue Nicotine patch.  -Currently stable  Oral thrush. -Treated with with Diflucan.  Acute toxic/metabolic encephalopathy -Resolved, due to polypharmacy -Decreased narcotics and Lyrica dose   Discharge Instructions   Allergies as of 08/08/2018   No Known Allergies     Medication List    STOP taking these medications   amLODipine 10 MG tablet Commonly known as:  NORVASC   apixaban 5 MG Tabs tablet Commonly known as:  ELIQUIS   atorvastatin 40 MG tablet Commonly known as:  LIPITOR   DULoxetine 60 MG capsule Commonly known as:  CYMBALTA   fenofibrate 160 MG tablet   ondansetron 4 MG tablet Commonly known as:  ZOFRAN   potassium chloride 10 MEQ tablet Commonly known as:  K-DUR   senna-docusate 8.6-50 MG tablet Commonly known as:  Senokot-S     TAKE these medications   alum & mag hydroxide-simeth 200-200-20 MG/5ML suspension Commonly known as:  MAALOX/MYLANTA Take 15-30 mLs by  mouth every 2 (two) hours as needed for indigestion.   amoxicillin-clavulanate 875-125 MG tablet Commonly known as:  Augmentin Take 1 tablet by mouth 2 (two) times daily for 3 days.   docusate sodium 100 MG capsule Commonly known as:  COLACE Take 1 capsule (100 mg total) by mouth 2 (two) times daily for 30 days.   lidocaine 5 % Commonly known as:  LIDODERM Place 1 patch onto the skin daily. Remove & Discard patch within 12 hours or as directed by MD   Metoprolol Tartrate 75 MG Tabs Take 75 mg by mouth 2 (two) times daily for 30 days. What changed:    medication strength  how much to take   nicotine 21 mg/24hr patch Commonly known as:  NICODERM CQ - dosed in mg/24 hours Place 1 patch (21 mg total) onto the skin daily.   oxyCODONE 5 MG immediate release tablet Commonly known as:  Oxy IR/ROXICODONE Take 1-2 tablets (5-10 mg total) by mouth every 4 (four) hours as needed for moderate pain.   pantoprazole 40 MG tablet Commonly known as:  PROTONIX Take 1 tablet (40 mg total) by mouth daily. Start taking on:  Aug 09, 2018   pregabalin 200 MG capsule Commonly known as:  LYRICA Take 1 capsule by mouth 3 (three) times daily.   saccharomyces boulardii 250 MG capsule Commonly known as:  FLORASTOR Take 1 capsule (250 mg total) by mouth 2 (two) times daily.   tiZANidine 4 MG tablet Commonly known as:  ZANAFLEX Take 1-2 tablets (4-8 mg total) by mouth every 8 (eight) hours as needed for muscle spasms.      Follow-up Information    [place] Follow up.   Why:  Home Hospice referral Contact information: [address] [place] [address] [phone]  No Known Allergies  Consultations:  Vascular surgery  Gastroenterology  Infectious disease  Interventional radiology  General surgery  Procedures/Studies: Ct Abdomen Pelvis W Wo Contrast  Result Date: 07/09/2018 CLINICAL DATA:  Generalized weakness with nausea and left lower abdominal  pain. Known mesenteric ischemia pancreatitis. EXAM: CT ABDOMEN AND PELVIS WITHOUT AND WITH CONTRAST TECHNIQUE: Multidetector CT imaging of the abdomen and pelvis was performed following the standard protocol before and following the bolus administration of intravenous contrast. CONTRAST:  OMNIPAQUE IOHEXOL 300 MG/ML  SOLN COMPARISON:  05/27/2018 FINDINGS: Lower chest: Dependent atelectasis noted posterior right lower lobe. Hepatobiliary: 4 mm low-density lesion medial segment left liver too small to characterize but likely benign etiology such as cyst. There is no evidence for gallstones, gallbladder wall thickening, or pericholecystic fluid. No intrahepatic or extrahepatic biliary dilation. Pancreas: Interval organization of the fluid collection seen previously the pancreatic tail. Dominant component in the pancreatic tail demonstrates rim enhancement and measures 2.5 x 3.3 cm (36/4). This fluid than dissects posteriorly into the left perirenal space and is contiguous with a 3.1 x 1.0 cm rim enhancing fluid collection along the anterior margin of the upper pole left kidney. Peripancreatic edema/inflammation in the region of the pancreatic tail is evident. 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 Adrenals/Urinary Tract: No adrenal nodule or mass. 6 mm nonobstructing stone identified interpolar right kidney. Mild right hydronephrosis is associated with distention of the proximal right ureter. Similar mild left hydroureteronephrosis evident. Right ureter transitions to normal diameter at the level of the right external iliac artery. Left ureter remains distended down to the left pelvic sidewall as well. Bladder is distended. Stomach/Bowel: Small hiatal hernia. Stomach otherwise unremarkable. Duodenum is normally positioned as is the ligament of Treitz. Small bowel loops are fluid-filled and distended up to 2.7 cm diameter. No discrete transition zone can be  identified, but distal small bowel and terminal ileum are decompressed. The appendix is not visualized, but there is no edema or inflammation in the region of the cecum. Right colon is unremarkable. Transverse colon is distended. Distal transverse colon and splenic flexure shows circumferential wall thickening. Descending colon is abnormal, demonstrating pericolonic edema/inflammation. Lateral and posterior wall of the descending colon is thickened but anterior and medial wall is imperceptible. Distal descending colon into the sigmoid colon shows circumferential wall thickening again. There is fluid in the left paracolic gutter and edema/fluid tracks into the left omentum. 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 3.9 x 1.8 cm fluid collection is identified adjacent to the distal aspect of the right external iliac limb (119/4). 2.6 x 1.1 cm collection in the right groin may be a seroma. Celiac axis remains occluded. SMA is opacified. Portal vein is patent. Splenic vein is markedly attenuated but remains patent. The SMV is patent. Reproductive: Uterus surgically absent. 2.2 x 3.6 cm cystic lesion along the left pelvic sidewall is presumably ovarian and unchanged since prior study. Other: No substantial intraperitoneal free fluid although there is a small amount of fluid in the para colic gutters bilaterally. No intraperitoneal free air. Musculoskeletal: No worrisome lytic or sclerotic osseous abnormality. Status post lumbosacral fusion. Spinal stimulator device evident. Intramuscular lipoma noted anterior aspect proximal right thigh. IMPRESSION: 1. Interval development of abnormal splenic flexure and descending colon. Circumferential wall thickening noted distal transverse colon and splenic flexure with proximal and mid descending colon showing posterior  wall thickening but anterior colonic wall is  imperceptible in this region. Distal descending colon into the sigmoid colon again shows circumferential wall thickening. There is prominent edema/inflammation around the proximal and mid descending colon with fluid/edema tracking into the left mesentery and pooling in the left paracolic gutter. Given celiac axis and IMA occlusion, ischemic disease a concern and watershed event would be a consideration. Infectious/inflammatory colitis also possibility. 2. Mild distention of proximal and mid small bowel with decompressed small bowel loops. Features probably related to ileus from the above described colonic pathology. 3. Interval evolution of pancreatic tail pseudocyst, now with organized, enhancing rim and evidence of extension into the perinephric space, along the anterior capsule of the upper left kidney. 4. Status post aortobifemoral bypass graft. Right iliac limb now opacifies. Small fluid collection identified around the distal aspect of the right external iliac limb with increased attenuation, suggesting hematoma. 5. Probable small seroma or hematoma in the right groin. These results will be called to the ordering clinician or representative by the Radiologist Assistant, and communication documented in the PACS or zVision Dashboard. Electronically Signed   By: Kennith CenterEric  Mansell M.D.   On: 07/09/2018 19:41   Ct Head Wo Contrast  Result Date: 07/28/2018 CLINICAL DATA:  Unexplained altered level of consciousness. EXAM: CT HEAD WITHOUT CONTRAST TECHNIQUE: Contiguous axial images were obtained from the base of the skull through the vertex without intravenous contrast. COMPARISON:  07/07/2018.  05/30/2018. FINDINGS: Brain: No change since the previous study. Brainstem and cerebellum appear unremarkable. Cerebral hemispheres show a few old small vessel infarctions within the basal ganglia and deep white matter. No cortical or large vessel territory infarction. No mass lesion, hemorrhage hydrocephalus or extra-axial  collection. Vascular: There is atherosclerotic calcification of the major vessels at the base of the brain. Skull: Negative Sinuses/Orbits: Posterior nasal polyp again seen on the left, measuring about 2.5 cm in maximal size. Chronic inflammatory changes of the left division of the sphenoid sinus. Other sinuses negative. Orbits negative. Benign venous calcification in the left orbit not significant. Other: None IMPRESSION: No change or acute finding. 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 Chronic left sphenoid sinusitis. Chronic left posterior nasal polyp or polypoid mass. Electronically Signed   By: Paulina FusiMark  Shogry M.D.   On: 07/28/2018 17:41   Ct Abdomen Pelvis W Contrast  Result Date: 07/23/2018 CLINICAL DATA:  Abdominal pain with fever EXAM: CT ABDOMEN AND PELVIS WITH CONTRAST TECHNIQUE: Multidetector CT imaging of the abdomen and pelvis was performed using the standard protocol following bolus administration of intravenous contrast. CONTRAST:  100mL OMNIPAQUE IOHEXOL 300 MG/ML  SOLN COMPARISON:  CT 07/16/2018, 07/15/2018, 07/09/2018 FINDINGS: Lower chest: Development of small left pleural effusion. Dependent atelectasis within the posterior lower lobes. Borderline to mild cardiomegaly. Hepatobiliary: Subcentimeter hypodensity within the liver, no change. Diffuse increased density in the gallbladder lumen possible sludge. No biliary dilatation. Pancreas: No ductal dilatation. Further decrease in size of presumed pseudocyst at the tail of the pancreas, now measuring 19 mm. Mild residual inflammatory changes. Spleen: Similar appearance multifocal hypodensities, possible chronic infarcts. Adrenals/Urinary Tract: Adrenal glands are normal. No hydronephrosis. Scarring within the mid to lower pole of the right kidney. Small focus of air within the urinary bladder. Stomach/Bowel: Focal defect within the gastric wall near the fundal region/anterior proximal body  of the stomach, series 6, image number 45, series 7, image number 91. Fluid and gas collection cephalad to this consistent with perforation. Drainage catheter  is noted in the region. Interval partial colectomy with creation of right lower quadrant colostomy. Small fat containing parastomal hernia. Mild edema adjacent to the ostomy loop presumably due to resolving operative change. Blind ending rectosigmoid stump is unremarkable. Vascular/Lymphatic: Extensive aortic atherosclerosis. Chronically occluded proximal celiac artery. SMA is enhancing. Status post aorto femoral bypass with luminal irregularity of the proximal right iliac limb but with flow present. Small residual fluid collection in the right groin, likely postoperative. No significant adenopathy. Reproductive: Status post hysterectomy. 3.6 cm left adnexal cyst, minimal increase in size. Other: Interval large ventral wound. Placement of 2 left abdominal drainage catheters, the more cephalad catheter tip terminates within the upper anterior abdomen. The more inferior drainage catheter tip is visible in the left lower quadrant. Multiple foci of extraluminal gas within the left upper quadrant mesentery, noted to be in the region of previously noted perforated colon. Moderate edema and soft tissue stranding within the mesentery. Small foci of free air adjacent to the liver. Multiple small organizing fluid collections within the left abdomen, largest is seen in the left gutter and measures 2.3 cm. Small organizing fluid collection anterior to the distal body of the pancreas. Small gas and fluid collection along the inferior aspect of the stomach. Musculoskeletal: Postsurgical changes L3 through L5 with posterior rods and fixating screws. Left pedicle screw at L3 breaches the superior endplate. Fat containing mass anterior to the right hip. IMPRESSION: 1. Interval partial colectomy with creation of right lower quadrant colostomy. Placement of left upper and lower  quadrant drainage catheters. Moderate amount of pneumoperitoneum within the left upper quadrant mesentery and adjacent to the liver, suspect that this may be secondary to residual operative air. There is however a focal defect within the fundal/anterior proximal body of the stomach, adjacent to the left upper quadrant drainage catheter which is contiguous with gas and fluid surrounding a portion of the catheter. This is suspected to represent a focal gastric perforation. Moderate inflammatory changes in the left upper quadrant mesentery, likely due to resolving postsurgical change. 2. Multiple small organizing fluid collections within the left mid and upper abdomen, suspect to represent small intra-abdominal abscesses. The largest is seen in the left gutter and measures 2.3 cm. 3. Development of small left pleural effusion with basilar atelectasis. 4. Other chronic findings as previously reported. Decreased size of presumed pseudocyst in the pancreatic tail. Critical Value/emergent results were called by telephone at the time of interpretation on 07/23/2018 at 8:06 pm to Dr. Luisa Hart, who verbally acknowledged these results. Electronically Signed   By: Jasmine Pang M.D.   On: 07/23/2018 20:06   Ct Abdomen Pelvis W Contrast  Result Date: 07/15/2018 CLINICAL DATA:  73 year old female with history of nausea and inability to eat for the past 6 months. 60 pounds of weight loss. EXAM: CT ABDOMEN AND PELVIS WITH CONTRAST TECHNIQUE: Multidetector CT imaging of the abdomen and pelvis was performed using the standard protocol following bolus administration of intravenous contrast. CONTRAST:  ISOVUE-300 IOPAMIDOL (ISOVUE-300) INJECTION 61% COMPARISON:  CT the abdomen and pelvis 07/09/2018. FINDINGS: Lower chest: Areas of scarring in the inferior segment of the lingula and posterior aspect of the right lower lobe. Aortic atherosclerosis. Calcifications of the mitral annulus. Hepatobiliary: Subcentimeter low-attenuation  lesion in segment 4A of the liver, too small to characterize, but statistically likely to represent a tiny cyst. No other suspicious hepatic lesions. No intra or extrahepatic biliary ductal dilatation. Amorphous intermediate attenuation material lying dependently in the gallbladder, new compared to the  prior study, favored to reflect vicarious excretion of contrast related to recent CT examination. Pancreas: Low-attenuation lesion in the tail of the pancreas, slightly decreased compared to the prior study, currently measuring 2.4 x 2.8 cm (axial image 26 of series 3), with persistent surrounding inflammatory changes. No other pancreatic mass. No pancreatic ductal dilatation. 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 Adrenals/Urinary Tract: There is a small amount of subcapsular fluid associated with the anterior and lateral aspect of the upper and interpolar region of the left kidney, which appears similar to the prior study and has likely dissected posteriorly from the pancreatic fluid collection mentioned above. Mild multifocal cortical thinning in the right kidney, most severe in the lower pole. 5 mm nonobstructive calculus in the interpolar collecting system of the right kidney. No hydroureteronephrosis. Urinary bladder is normal in appearance. Bilateral adrenal glands are normal in appearance. Stomach/Bowel: Normal appearance of the stomach. No pathologic dilatation of small bowel or colon. In the region of the descending colon there is again some mural thickening proximally. In the mid to distal descending colon there is discontinuity of the anterior colonic wall with gas extending anteriorly into the adjacent fatty soft tissues (presumably omentum), indicative of frank perforation. Normal appendix. Vascular/Lymphatic: Aortic atherosclerosis with postoperative changes of aortobifemoral bypass procedure. Today's study  was not a CTA examination, however, the celiac axis again appears occluded at the ostium. Superior mesenteric artery appears grossly patent. IMA is not confidently identified. No lymphadenopathy noted in the abdomen or pelvis. Reproductive: Status post hysterectomy. Right ovary is atrophic. 3.4 x 2.5 x 3.1 cm low-attenuation lesion in the left adnexa is likely to represent a cyst. Other: Despite the apparent perforation of the descending colon and gas which appears to be contained within a portion of the omentum in the left side of the abdomen, there is no other definite pneumoperitoneum. No significant volume of ascites. Musculoskeletal: There are no aggressive appearing lytic or blastic lesions noted in the visualized portions of the skeleton. Status post PLIF from L3-L5 with interbody grafts at L3-L4 and L4-L5. IMPRESSION: 1. Compared to the prior examination there is now frank perforation of the descending colon which appears to be predominantly contained within a portion of the omentum in the left side of the abdomen. Surgical consultation is strongly recommended. 2. 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 A small portion of this collection appears to have dissected posteriorly now involving the subcapsular space associated with the upper and interpolar regions of the left kidney. 3. 5 mm nonobstructive calculus in the interpolar collecting system of the right kidney. 4. Additional incidental findings, as above, similar to the prior examination. Critical Value/emergent results were called by telephone at the time of interpretation on 07/15/2018 at 3:46 pm to Dr. Candelaria Stagers , who verbally acknowledged these results. Electronically Signed   By: Trudie Reed M.D.   On: 07/15/2018 15:47   Dg Chest Port 1 View  Result Date: 07/11/2018 CLINICAL  DATA:  Hypoxemia. EXAM: PORTABLE CHEST 1 VIEW COMPARISON:  Radiograph of July 07, 2018. FINDINGS: The heart size and mediastinal contours are within normal limits. Both lungs are clear. No pneumothorax or pleural effusion is noted. The visualized skeletal structures are unremarkable. IMPRESSION: No active disease. Electronically Signed   By: Lupita Raider, M.D.   On:  07/11/2018 16:31   Dg Kayleen Memos W Single Cm (sol Or Thin Ba)  Result Date: 07/27/2018 CLINICAL DATA:  Apparent defect in the gastric wall on CT scan of 07/23/2018. EXAM: WATER SOLUBLE UPPER GI SERIES TECHNIQUE: Single-column upper GI series was performed using water soluble contrast. CONTRAST:  25mL OMNIPAQUE IOHEXOL 300 MG/ML  SOLN COMPARISON:  CT scan 07/23/2018 FLUOROSCOPY TIME:  Fluoroscopy Time:  3 minutes and 18 seconds. Radiation Exposure Index (if provided by the fluoroscopic device): 91 mGy Number of Acquired Spot Images: FINDINGS: The patient was reluctant to drink the water-soluble contrast. After coaching, encouraging, and pleading with the patient, she refused to drink a sufficient quantity to obtain a diagnostic study. We repositioned the patient as much as possible in attempts to move the contrast material to the area of abnormal gastric wall seen on CT, but this was not possible given the location of the apparent mural defect. IMPRESSION: Nondiagnostic study. Careful review of the CT scan from 07/23/2018 shows an apparent mural defect in the cranial wall of the mid stomach, adjacent to where the drain passes over the top of the stomach. As noted above, the patient was unwilling to consume enough contrast to fill the stomach sufficiently to assess this portion of the gastric wall. Despite concerted effort with repositioning, we were unable to move the contrast pool to area of concern. CT scan of the abdomen may help if water soluble contrast administered during the study can be demonstrated outside of the stomach. Electronically Signed    By: Kennith Center M.D.   On: 07/27/2018 16:28   Ct Image Guided Drainage By Percutaneous Catheter  Result Date: 07/16/2018 INDICATION: 73 year old with perforation of the descending colon. There is a large extraluminal gas collection that communicates with the descending colon. General Surgery requested placement of a drainage catheter within the extraluminal gas collection. EXAM: CT-GUIDED DRAIN PLACEMENT WITHIN THE PERICOLONIC COLLECTION MEDICATIONS: The patient is currently admitted to the hospital and receiving intravenous antibiotics. ANESTHESIA/SEDATION: 2.0 mg IV Versed 75 mcg IV Fentanyl Moderate Sedation Time:  31 minutes The patient was continuously monitored during the procedure by the interventional radiology nurse under my direct supervision. COMPLICATIONS: None immediate. TECHNIQUE: Informed written consent was obtained from the patient after a thorough discussion of the procedural risks, benefits and alternatives. All questions were addressed. Maximal Sterile Barrier Technique was utilized including caps, mask, sterile gowns, sterile gloves, sterile drape, hand hygiene and skin antiseptic. A timeout was performed prior to the initiation of the procedure. PROCEDURE: CT images through the abdomen were obtained. The large gas collection communicating the descending colon in the left anterior abdomen was targeted. The left upper abdomen was prepped with chlorhexidine and sterile field was created. Skin and soft tissues anesthetized with 1% lidocaine. 18 gauge trocar needle was directed into the extraluminal collection. Air was coming out of the needle. Stiff Amplatz wire preferentially went into the adjacent colon. As a result, this access was removed. Skin was anesthetized slightly more medial and cephalad. Needle was directed into the extraluminal gas collection with CT guidance. Again, the wire preferentially went into the colon. Tract was dilated to accommodate a 12 [place] multipurpose drain.  Initially, the tube was extending into the colon. The tube was pulled back so that the pigtail was outside the colon lumen. Catheter was sutured to skin and attached to a gravity bag. FINDINGS: Again noted is a contained perforation involving the anterior wall of the descending colon. The size the anterior colonic wall defect measures  close to 3 cm. Again noted is contained gas extending anterior and cephalad to the descending colon. Drainage catheter was placed within the extraluminal collection. Drain was pulled out of the colon lumen and positioned just anterior to the colon. IMPRESSION: CT-guided placement of a drainage catheter within the large extraluminal gas collection in the left abdomen. Findings are compatible with contained perforation of the descending colon. Electronically Signed   By: Richarda Overlie M.D.   On: 07/16/2018 14:42   [place] Ekg Site Rite  Result Date: 07/18/2018 If Site Rite image not attached, placement could not be confirmed due to current cardiac rhythm.   Subjective: Eager to go home  Discharge Exam: Vitals:   08/08/18 0549 08/08/18 1220  BP: 138/63 (!) 150/54  Pulse:  65  Resp: 17 13  Temp: 98.7 F (37.1 C) 99 F (37.2 C)  SpO2: 95% 96%   Vitals:   08/07/18 1946 08/07/18 2145 08/08/18 0549 08/08/18 1220  BP: (!) 146/59 (!) 141/61 138/63 (!) 150/54  Pulse: 78 94  65  Resp: Temp: 98.6 F (37 C)  98.7 F (37.1 C) 99 F (37.2 C)  TempSrc: Oral  Oral Oral  SpO2: 97%  95% 96%  Weight:   59.5 kg   Height:        General: Pt is alert, awake, not in acute distress Cardiovascular: RRR, S1/S2 +, no rubs, no gallops Respiratory: CTA bilaterally, no wheezing, no rhonchi Abdominal: Soft, NT, ND, bowel sounds + Extremities: no edema, no cyanosis   The results of significant diagnostics from this hospitalization (including imaging, microbiology, ancillary and laboratory) are listed below for reference.     Microbiology: No results found for this or  any previous visit (from the past 240 hour(s)).   Labs: BNP (last 3 results) No results for input(s): BNP in the last 8760 hours. Basic Metabolic Panel: Recent Labs  Lab 08/03/18 0426  NA 135  K 4.6  CL 107  CO2 20*  GLUCOSE 150*  BUN 17  CREATININE <0.30*  CALCIUM 8.5*  MG 2.1  PHOS 4.3   Liver Function Tests: Recent Labs  Lab 08/03/18 0426  AST 199*  ALT 164*  ALKPHOS 257*  BILITOT 0.5  PROT 6.8  ALBUMIN 1.6*   No results for input(s): LIPASE, AMYLASE in the last 168 hours. No results for input(s): AMMONIA in the last 168 hours. CBC: Recent Labs  Lab 08/03/18 0426 08/03/18 0757  WBC 11.1* 8.7  NEUTROABS 5.7 4.7  HGB 5.8* 8.9*  HCT 19.0* 28.3*  MCV 83.7 82.5  PLT 477* 427*   Cardiac Enzymes: No results for input(s): CKTOTAL, CKMB, CKMBINDEX, TROPONINI in the last 168 hours. BNP: Invalid input(s): POCBNP CBG: Recent Labs  Lab 08/03/18 0812 08/03/18 1144 08/03/18 1606 08/03/18 2121 08/04/18 0624  GLUCAP 113* 129* 117* 100* 114*   D-Dimer No results for input(s): DDIMER in the last 72 hours. Hgb A1c No results for input(s): HGBA1C in the last 72 hours. Lipid Profile No results for input(s): CHOL, HDL, LDLCALC, TRIG, CHOLHDL, LDLDIRECT in the last 72 hours. Thyroid function studies No results for input(s): TSH, T4TOTAL, T3FREE, THYROIDAB in the last 72 hours.  Invalid input(s): FREET3 Anemia work up No results for input(s): VITAMINB12, FOLATE, FERRITIN, TIBC, IRON, RETICCTPCT in the last 72 hours. Urinalysis    Component Value Date/Time   COLORURINE AMBER (A) 07/07/2018 1826   APPEARANCEUR HAZY (A) 07/07/2018 1826   LABSPEC 1.014 07/07/2018 1826   PHURINE 5.0  07/07/2018 1826   GLUCOSEU 50 (A) 07/07/2018 1826   HGBUR NEGATIVE 07/07/2018 1826   BILIRUBINUR NEGATIVE 07/07/2018 1826   KETONESUR NEGATIVE 07/07/2018 1826   PROTEINUR NEGATIVE 07/07/2018 1826   NITRITE NEGATIVE 07/07/2018 1826   LEUKOCYTESUR NEGATIVE 07/07/2018 1826   Sepsis  Labs Invalid input(s): PROCALCITONIN,  WBC,  LACTICIDVEN Microbiology No results found for this or any previous visit (from the past 240 hour(s)).  Time spent:  SIGNED:   Rickey Barbara, MD  [facility] 08/08/2018, 1:32 PM  If 7PM-7AM, please contact night-coverage

## 2018-08-08 NOTE — Progress Notes (Signed)
[facility] Progress Note  5 Days Post-Op  Subjective: CC-  Slept well last night. Currently denies any abdominal pain. Denies n/v. Tolerating a few bites of food here and there. States that she's excited to go home so she can have some cheese puffs and potato chips. Having bowel function.  Objective: Vital signs in last 24 hours: Temp:  [98.3 F (36.8 C)-98.7 F (37.1 C)] 98.7 F (37.1 C) (05/09 0549) Pulse Rate:  [71-94] 94 (05/08 2145) Resp:  [13-18] 17 (05/09 0549) BP: (130-146)/(58-65) 138/63 (05/09 0549) SpO2:  [95 %-97 %] 95 % (05/09 0549) Weight:  [59.5 kg] 59.5 kg (05/09 0549) Last BM Date: 08/07/18  Intake/Output from previous day: 05/08 0701 - 05/09 0700 In: 0  Out: 625 [Urine:525; Stool:100] Intake/Output this shift: No intake/output data recorded.  PE: Gen: Alert, NAD, pleasant HEENT: EOM's intact, pupils equal and round Pulm: effort normal Skin: warm and dry Abd: Soft, NT/ND, +BS, open midline incision with fibrinous exudate at proximal aspect, trace serous drainage, wound base pink, some visible sutures, dehiscence of the entire wound. ostomy pinkwith air and liquid brown stool in pouch     Lab Results:  No results for input(s): WBC, HGB, HCT, PLT in the last 72 hours. BMET No results for input(s): NA, K, CL, CO2, GLUCOSE, BUN, CREATININE, CALCIUM in the last 72 hours. PT/INR No results for input(s): LABPROT, INR in the last 72 hours. CMP     Component Value Date/Time   NA 135 08/03/2018 0426   NA 142 05/22/2018 1039   K 4.6 08/03/2018 0426   CL 107 08/03/2018 0426   CO2 20 (L) 08/03/2018 0426   GLUCOSE 150 (H) 08/03/2018 0426   BUN 17 08/03/2018 0426   BUN 10 05/22/2018 1039   CREATININE <0.30 (L) 08/03/2018 0426   CREATININE 0.72 08/07/2012 1016   CALCIUM 8.5 (L) 08/03/2018 0426   PROT 6.8 08/03/2018 0426   PROT 6.8 05/22/2018 1039   ALBUMIN 1.6 (L) 08/03/2018 0426   ALBUMIN 3.6 (L) 05/22/2018 1039   AST 199 (H)  08/03/2018 0426   ALT 164 (H) 08/03/2018 0426   ALKPHOS 257 (H) 08/03/2018 0426   BILITOT 0.5 08/03/2018 0426   BILITOT 0.3 05/22/2018 1039   GFRNONAA NOT CALCULATED 08/03/2018 0426   GFRNONAA 88 08/07/2012 1016   GFRAA NOT CALCULATED 08/03/2018 0426   GFRAA >89 08/07/2012 1016   Lipase     Component Value Date/Time   LIPASE 65 (H) 07/08/2018 1325       Studies/Results: No results found.  Anti-infectives: Anti-infectives (From admission, onward)   Start     Dose/Rate Route Frequency Ordered Stop   07/28/18 1000  fluconazole (DIFLUCAN) tablet 100 mg     100 mg Oral Daily 07/27/18 1219 08/04/18 0959   07/27/18 1400  fluconazole (DIFLUCAN) IVPB 200 mg     200 mg 100 mL/hr over 60 Minutes Intravenous  Once 07/27/18 1219 07/27/18 1426   07/15/18 1700  meropenem (MERREM) 1 g in sodium chloride 0.9 % 100 mL IVPB     1 g 200 mL/hr over 30 Minutes Intravenous Every 8 hours 07/15/18 1620 08/14/18 2359   07/08/18 2300  vancomycin (VANCOCIN) 1,500 mg in sodium chloride 0.9 % 500 mL IVPB  Status:  Discontinued     1,500 mg 250 mL/hr over 120 Minutes Intravenous  Once 07/07/18 2128 07/07/18 2129   07/08/18 2300  vancomycin (VANCOCIN) 1,500 mg in sodium chloride 0.9 % 500 mL IVPB  Status:  Discontinued  1,500 mg 250 mL/hr over 120 Minutes Intravenous Every 24 hours 07/07/18 2129 07/08/18 0726   07/08/18 0600  ceFEPIme (MAXIPIME) 2 g in sodium chloride 0.9 % 100 mL IVPB  Status:  Discontinued     2 g 200 mL/hr over 30 Minutes Intravenous Every 8 hours 07/07/18 2124 07/13/18 1333   07/07/18 2130  vancomycin (VANCOCIN) 1,500 mg in sodium chloride 0.9 % 500 mL IVPB     1,500 mg 250 mL/hr over 120 Minutes Intravenous  Once 07/07/18 2123 07/08/18 0028   07/07/18 2115  vancomycin (VANCOCIN) IVPB 1000 mg/200 mL premix  Status:  Discontinued     1,000 mg 200 mL/hr over 60 Minutes Intravenous  Once 07/07/18 2110 07/07/18 2128   07/07/18 2115  ceFEPIme (MAXIPIME) 2 g in sodium chloride 0.9 %  100 mL IVPB     2 g 200 mL/hr over 30 Minutes Intravenous  Once 07/07/18 2110 07/07/18 2203       Assessment/Plan A. 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 8.7 on 5/4 Anemia - Hgb 5.85/4. 8.9on 5/4after PRBC. VSS Code status DNR  Suspected chronic mesenteric ischemia Descending colon perforation on CT s/p Ex Lap, LOA, Left sigmoid colectomy, descending colostomy (Hartmann's), Dr. Derrell Lollingamirez, 4/16POD #23 -TID WTD dressing changesto help with necrotic tissue at base of wound -cont onHHdietand ensure although patientcontinues to have poor oral intake. TPN d/c'd 5/4 -colostomy functioning.Small amount of clotted bloodon stomacephalic. No active bleeding. Monitor. -mobilize and pulm toilet -AttemptedCortrak placement for TFs but patient vomited a blood clot 5/1. 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 Given new information, we will defer on Cortrak placement to avoid complications given low integrity of stomach mucosa. Pt underwent aortogram yesterday by vascular. 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 TPN d/c5/4. Patient has poor intake. Unfortunately, has not been progressing well since surgery and does not have a reliable long term source of nutrition. No good surgical options at this time to improve her status.Patient is pursuing hospice.   FEN-Regulardiet/ensure VTE -SCDs, heparindripon hold due to ABL anemia. Can restart from a surgical  standpoint if repeat Hgb stable. ID - diflucan 4/27-5/5. CurrentlyMeropenem 4/15per ID>> Foley - Removed POD 1 Follow up -Dr. Derrell Lollingamirez POC: Dellis Filbertammy Grant [phone] daughter/[name] [phone].  Plan: Planning d/c home with hospice. Ready for placement from surgical standpoint.   LOS: 32 days    [name] A Ashaad Gaertner , [facility] 08/08/2018, 8:54 AM Pager: [phone] Mon-Thurs 7:00 am-4:30 pm Fri 7:00 am -11:30 AM Sat-Sun 7:00 am-11:30 am

## 2018-08-08 NOTE — Care Management (Addendum)
Spoke with daughters Arline Asp and [name] this am.  They received hospital bed and 3n1.  They will transport patient home in private vehicle when she is ready.  [facility] notified.  RN will visit when patient's daughter, [name], is ready.  RN is in communication with family.    Reviewed dc meds with Hospice RN.  Pt will transition to Augmentin and PICC will be d/c'd.   DC summary faxed.

## 2018-08-12 ENCOUNTER — Other Ambulatory Visit: Payer: Self-pay

## 2018-08-12 ENCOUNTER — Emergency Department (HOSPITAL_COMMUNITY)
Admission: EM | Admit: 2018-08-12 | Discharge: 2018-08-12 | Disposition: A | Discharge status: Home / Self Care | Attending: Emergency Medicine | Admitting: Emergency Medicine

## 2018-08-12 ENCOUNTER — Encounter (HOSPITAL_COMMUNITY): Payer: Self-pay | Admitting: Emergency Medicine

## 2018-08-12 DIAGNOSIS — F17210 Nicotine dependence, cigarettes, uncomplicated: Secondary | ICD-10-CM | POA: Insufficient documentation

## 2018-08-12 DIAGNOSIS — Z79899 Other long term (current) drug therapy: Secondary | ICD-10-CM | POA: Diagnosis not present

## 2018-08-12 DIAGNOSIS — I10 Essential (primary) hypertension: Secondary | ICD-10-CM | POA: Insufficient documentation

## 2018-08-12 DIAGNOSIS — Z1159 Encounter for screening for other viral diseases: Secondary | ICD-10-CM | POA: Insufficient documentation

## 2018-08-12 DIAGNOSIS — Z20822 Contact with and (suspected) exposure to covid-19: Secondary | ICD-10-CM

## 2018-08-12 LAB — SARS CORONAVIRUS 2 BY RT PCR (HOSPITAL ORDER, PERFORMED IN ~~LOC~~ HOSPITAL LAB): SARS Coronavirus 2: NEGATIVE

## 2018-08-12 NOTE — ED Provider Notes (Signed)
[facility] EMERGENCY DEPARTMENT Provider Note   CSN: [id number] Arrival date & time: 08/12/18  1429    History   Chief Complaint Chief Complaint  Patient presents with  . labs    COVID -19    HPI [name] is a 73 y.o. female.     Patient is a DNR following terminal complications due to his celiac artery occlusion.  Patient was recently admitted from April 7 discharged home on May 9.  Family was aware of the diagnosis and was sent home on palliative care.  But now they want to move her to inpatient hospice care.  In order to do that they need negative COVID-19 test.  Patient here to have that done.  They did call ahead of time.  Patient in no acute distress or any other change.  Her just for here for the COVID testing.     Past Medical History:  Diagnosis Date  . Chronic back pain   . GERD (gastroesophageal reflux disease)   . Hypercholesterolemia   . Hyperlipidemia   . Hypertension   . Osteoarthritis of left knee    End-stage  . Palpitations   . PVD (peripheral vascular disease) (HCC)   . Tobacco user     Patient Active Problem List   Diagnosis Date Noted  . Palliative care by specialist   . Goals of care, counseling/discussion   . Bowel perforation (HCC) 07/16/2018  . Lower abdominal pain   . Abnormal CT scan, colon   . Abnormal liver function   . Loss of weight   . Nausea without vomiting   . Sepsis (HCC) 07/07/2018  . Severe protein-calorie malnutrition (HCC) 07/07/2018  . Contusion of right hip   . COPD with acute exacerbation (HCC)   . General weakness 05/28/2018  . Fall 05/28/2018  . UTI (urinary tract infection) 05/28/2018  . Pancreatitis 05/28/2018  . Lumbar radiculopathy 07/30/2016  . Depression 07/24/2015  . S/P lumbar fusion 01/27/2015  . GAD (generalized anxiety disorder) 06/13/2014  . Neck pain 07/14/2012  . S/P lumbar spinal fusion 07/14/2012  . Lumbar stenosis with neurogenic claudication 06/11/2012  . DDD (degenerative disc  disease), lumbosacral 04/24/2012  . Spondylolisthesis at L5-S1 level 04/24/2012  . Hyperlipidemia with target LDL less than 100 06/16/2009  . TOBACCO ABUSE 06/16/2009  . Essential hypertension 06/16/2009  . Peripheral vascular disease (HCC) 06/16/2009  . GERD 06/16/2009  . Chronic back pain 06/16/2009  . CAROTID BRUIT 05/23/2009    Past Surgical History:  Procedure Laterality Date  . ABDOMINAL AORTOGRAM W/LOWER EXTREMITY Bilateral 08/03/2018   Procedure: ABDOMINAL AORTOGRAM W/LOWER EXTREMITY;  Surgeon: Sherren Kerns, MD;  Location: [facility] INVASIVE CV LAB;  Service: Cardiovascular;  Laterality: Bilateral;  . Angiogram-type unspecified  06/13/00   Dr. Hart Rochester  . BIOPSY  07/14/2018   Procedure: BIOPSY;  Surgeon: Beverley Fiedler, MD;  Location: [facility] ENDOSCOPY;  Service: Gastroenterology;;  . COLON RESECTION N/A 07/16/2018   Procedure: EXPLORATORY LAPAROTOMY, LEFT SIGMOID COLECTOMY,, SPLENIC FLEXOR, COLOSTOMY;  Surgeon: Axel Filler, MD;  Location: [facility] OR;  Service: General;  Laterality: N/A;  . ESOPHAGOGASTRODUODENOSCOPY N/A 07/14/2018   Procedure: ESOPHAGOGASTRODUODENOSCOPY (EGD);  Surgeon: Beverley Fiedler, MD;  Location: [facility] ENDOSCOPY;  Service: Gastroenterology;  Laterality: N/A;  . FEMORAL-POPLITEAL BYPASS GRAFT    . FINGER TENDON REPAIR  1988   Right mallet  . FLEXIBLE SIGMOIDOSCOPY N/A 07/14/2018   Procedure: FLEXIBLE SIGMOIDOSCOPY;  Surgeon: Beverley Fiedler, MD;  Location: [facility] ENDOSCOPY;  Service: Gastroenterology;  Laterality:  N/A;  . KNEE ARTHROSCOPY  1996   Left  . LUMBAR FUSION  1998   Dr. Jacqulyn BathLong  . PATCH ANGIOPLASTY Right 05/31/2018   Procedure: PATCH ANGIOPLASTY RIGHT FEMORAL ARTERY WITH DECRON PATCH;  Surgeon: Maeola Harmanain, Brandon Christopher, MD;  [facility];  Service: Vascular;  Laterality: Right;  . THROMBECTOMY FEMORAL ARTERY Right 05/31/2018   Procedure: RIGHT FEMORAL THROMBECTOMY;  Surgeon: Maeola Harmanain, Brandon Christopher, MD;  [facility];  Service: Vascular;  Laterality: Right;  .  VISCERAL ANGIOGRAPHY N/A 08/03/2018   Procedure: VISCERAL ANGIOGRAPHY;  Surgeon: Sherren KernsFields, Charles E, MD;  Location: [facility] INVASIVE CV LAB;  Service: Cardiovascular;  Laterality: N/A;     OB History   No obstetric history on file.      Home Medications    Prior to Admission medications   Medication Sig Start Date End Date Taking? Authorizing Provider  alum & mag hydroxide-simeth (MAALOX/MYLANTA) 200-200-20 MG/5ML suspension Take 15-30 mLs by mouth every 2 (two) hours as needed for indigestion. 06/08/18   Delano MetzSchertz, Robert, MD  docusate sodium (COLACE) 100 MG capsule Take 1 capsule (100 mg total) by mouth 2 (two) times daily for 30 days. 08/08/18 09/07/18  Jerald Kiefhiu, Stephen K, MD  lidocaine (LIDODERM) 5 % Place 1 patch onto the skin daily. Remove & Discard patch within 12 hours or as directed by MD 06/08/18   Delano MetzSchertz, Robert, MD  Metoprolol Tartrate 75 MG TABS Take 75 mg by mouth 2 (two) times daily for 30 days. 08/08/18 09/07/18  Jerald Kiefhiu, Stephen K, MD  nicotine (NICODERM CQ - DOSED IN MG/24 HOURS) 21 mg/24hr patch Place 1 patch (21 mg total) onto the skin daily. 06/08/18   Delano MetzSchertz, Robert, MD  oxyCODONE (OXY IR/ROXICODONE) 5 MG immediate release tablet Take 1-2 tablets (5-10 mg total) by mouth every 4 (four) hours as needed for moderate pain. 08/08/18   Jerald Kiefhiu, Stephen K, MD  pantoprazole (PROTONIX) 40 MG tablet Take 1 tablet (40 mg total) by mouth daily. 08/09/18   Jerald Kiefhiu, Stephen K, MD  pregabalin (LYRICA) 200 MG capsule Take 1 capsule by mouth 3 (three) times daily. 05/20/18   [provider]  saccharomyces boulardii (FLORASTOR) 250 MG capsule Take 1 capsule (250 mg total) by mouth 2 (two) times daily. 08/08/18   Jerald Kiefhiu, Stephen K, MD  tiZANidine (ZANAFLEX) 4 MG tablet Take 1-2 tablets (4-8 mg total) by mouth every 8 (eight) hours as needed for muscle spasms. 09/02/16   Bennie PieriniMartin, Mary-Margaret, [name]    Family History Family History  Problem Relation Age of Onset  . Heart failure Father        CHF  . Hypertension Father    . Heart disease Father   . Hypertension Paternal Aunt   . Hypertension Paternal Uncle   . Hypertension Paternal Grandmother   . Hypertension Paternal Grandfather   . Coronary artery disease Neg Hx        Premature    Social History Social History   Tobacco Use  . Smoking status: Current Every Day Smoker    Packs/day: 1.00    Years: 52.00    Pack years: 52.00    Types: Cigarettes  . Smokeless tobacco: Never Used  Substance Use Topics  . Alcohol use: No    Comment: Occasionally  . Drug use: No     Allergies   Patient has no known allergies.   Review of Systems Review of Systems  Constitutional: Negative for fever.  HENT: Negative for congestion.   Eyes: Negative for redness.  Respiratory: Negative for shortness of breath.   Cardiovascular: Negative for chest pain.  Gastrointestinal: Negative for abdominal pain.  Neurological: Negative for syncope.  Psychiatric/Behavioral: Negative for behavioral problems.     Physical Exam Updated Vital Signs BP 133/63 (BP Location: Left Arm)   Pulse 61   Temp 98.2 F (36.8 C) (Oral)   Resp 17   Ht 1.702 m ( )   Wt 63.5 kg   SpO2 95% Comment: Simultaneous filing. User may not have seen previous data.  BMI 21.93 kg/m   Physical Exam Vitals signs and nursing note reviewed.  Constitutional:      General: She is not in acute distress.    Appearance: She is well-developed. She is ill-appearing.  HENT:     Head: Normocephalic and atraumatic.  Eyes:     Conjunctiva/sclera: Conjunctivae normal.  Neck:     Musculoskeletal: Neck supple.  Cardiovascular:     Rate and Rhythm: Normal rate and regular rhythm.     Heart sounds: No murmur.  Pulmonary:     Effort: Pulmonary effort is normal. No respiratory distress.     Breath sounds: Normal breath sounds.  Abdominal:     Palpations: Abdomen is soft.     Tenderness: There is no abdominal tenderness.     Comments: Colostomy  Skin:    General: Skin is warm and dry.   Neurological:     Mental Status: She is alert. Mental status is at baseline.     Comments: Patient alert.      ED Treatments / Results  Labs (all labs ordered are listed, but only abnormal results are displayed) Labs Reviewed  SARS CORONAVIRUS 2 (HOSPITAL ORDER, PERFORMED IN [facility] LAB)    EKG None  Radiology No results found.  Procedures Procedures (including critical care time)  Medications Ordered in ED Medications - No data to display   Initial Impression / Assessment and Plan / ED Course  I have reviewed the triage vital signs and the nursing notes.  Pertinent labs & imaging results that were available during my care of the patient were reviewed by me and considered in my medical decision making (see chart for details).        COVID-19 test was negative.  Cleared for hospice care.  Final Clinical Impressions(s) / ED Diagnoses   Final diagnoses:  Covid-19 Virus not Detected    ED Discharge Orders    None       Vanetta Mulders, MD 08/12/18 1702

## 2018-08-12 NOTE — ED Triage Notes (Signed)
Here for COVID-19 test for placement in Hospice.

## 2018-08-12 NOTE — ED Notes (Signed)
[facility] Nurse, to call [facility] with report.

## 2018-08-12 NOTE — Discharge Instructions (Addendum)
COVID testing was negative.  Cleared for hospice.

## 2018-08-31 DEATH — deceased

## 2018-09-03 ENCOUNTER — Ambulatory Visit: Payer: Medicare Other | Admitting: Family

## 2018-11-20 ENCOUNTER — Ambulatory Visit: Payer: Medicare Other | Admitting: Family

## 2019-04-23 IMAGING — CT CT IMAGE GUIDED DRAINAGE BY PERCUTANEOUS CATHETER
1 of 5 series · 11 of 32 positions shown, 17 images · non-contrast
Comparison: none

INDICATION: 73-year-old with perforation of the descending colon. There is a
large extraluminal gas collection that communicates with the
descending colon. General Surgery requested placement of a drainage
catheter within the extraluminal gas collection.
TECHNIQUE: Informed written consent was obtained from the patient after a
thorough discussion of the procedural risks, benefits and
alternatives. All questions were addressed. Maximal Sterile Barrier
Technique was utilized including caps, mask, sterile gowns, sterile
gloves, sterile drape, hand hygiene and skin antiseptic. A timeout
was performed prior to the initiation of the procedure.

[Series 2: i-spiral 5.0 b40f · axial · 0.69mm/px · z∈[+966,+1145]mm · 11 of 63 slices shown, 17 images]
[im 6/63  soft-tissue]
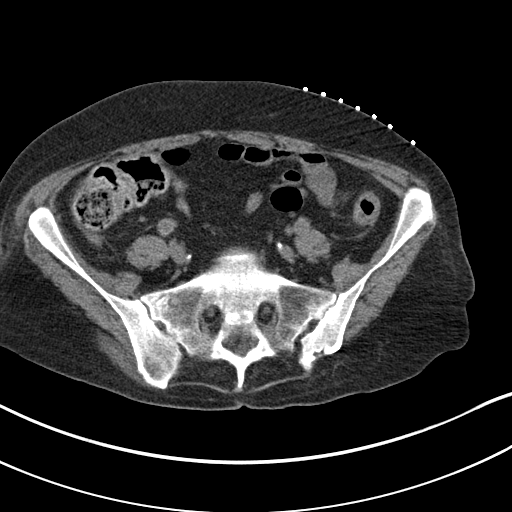
[im 6/63  bone]
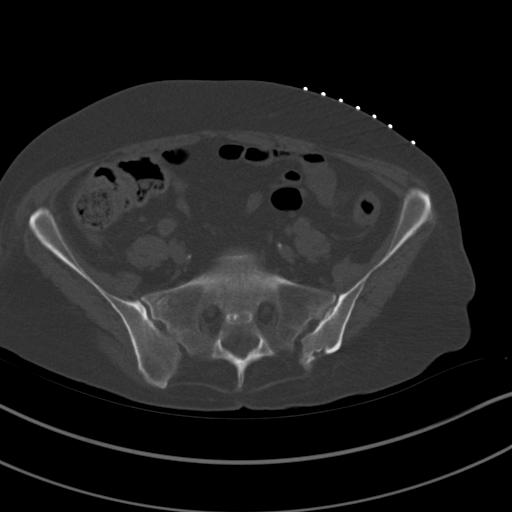
[im 11/63  soft-tissue]
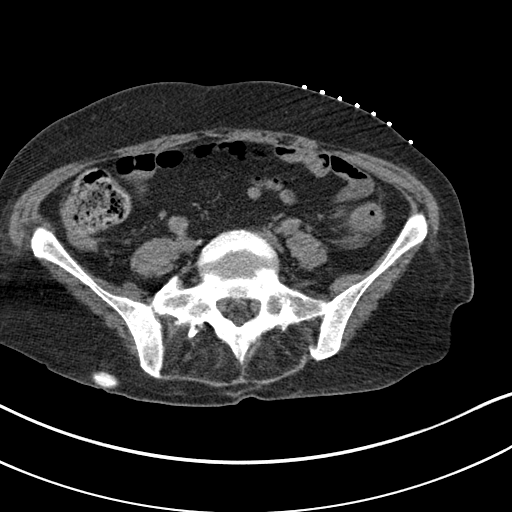
[im 16/63  soft-tissue]
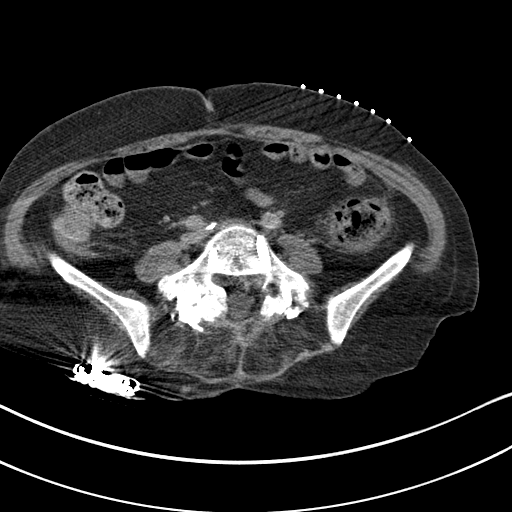
[im 21/63  soft-tissue]
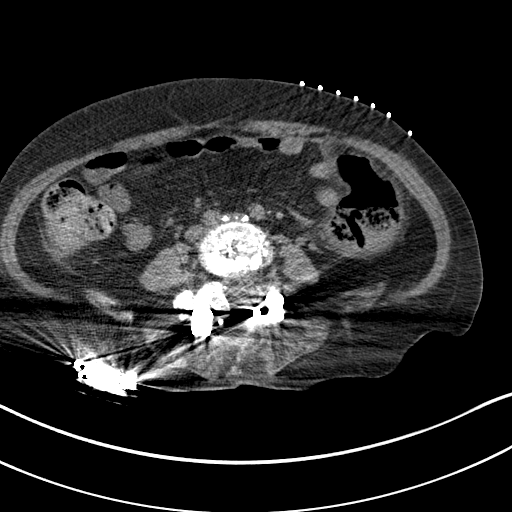
[im 26/63  soft-tissue]
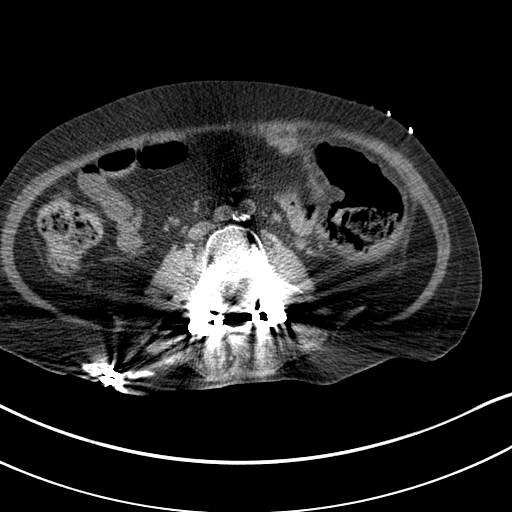
[im 32/63  soft-tissue]
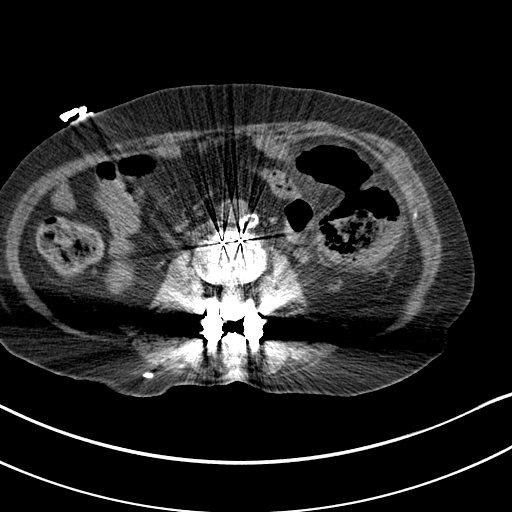
[im 37/63  soft-tissue]
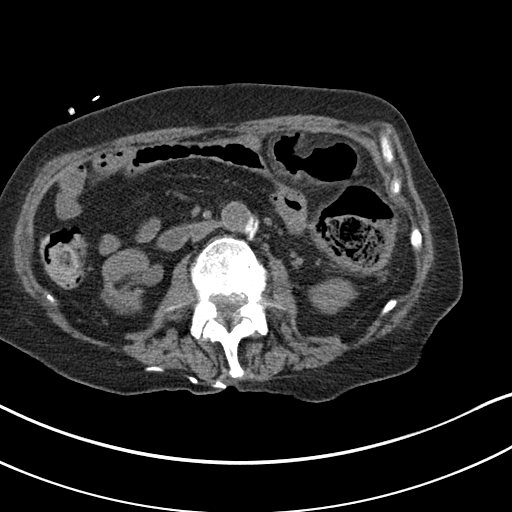
[im 42/63  soft-tissue]
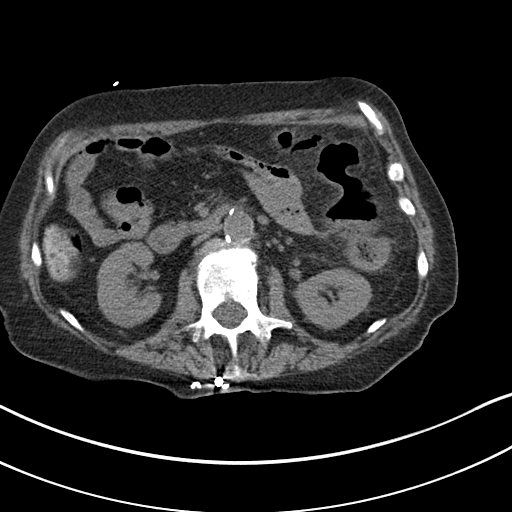
[im 42/63  lung]
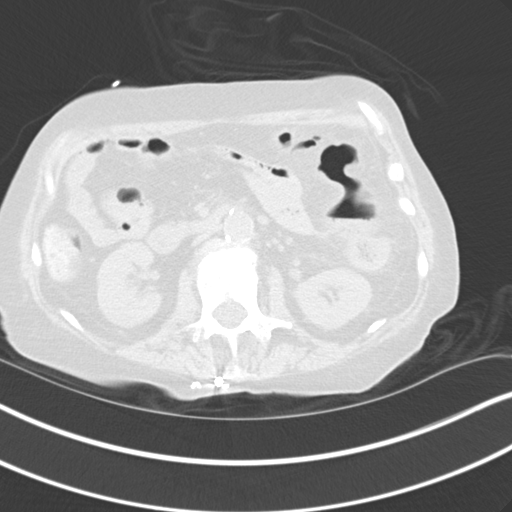
[im 47/63  soft-tissue]
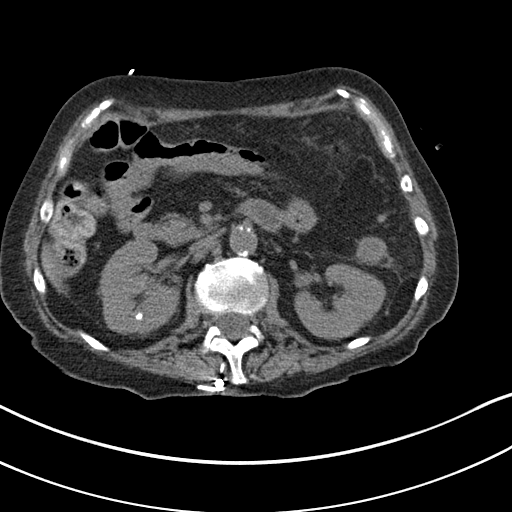
[im 47/63  lung]
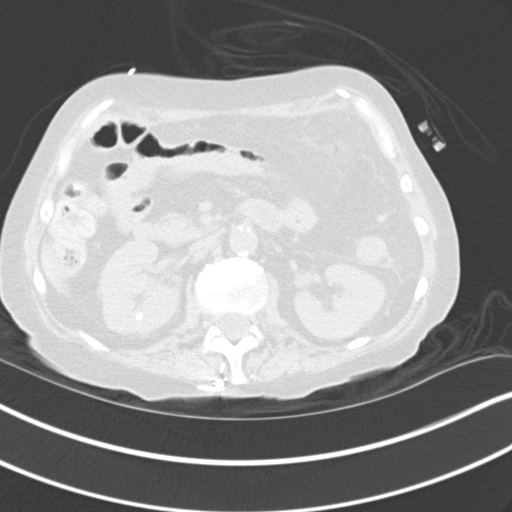
[im 47/63  bone]
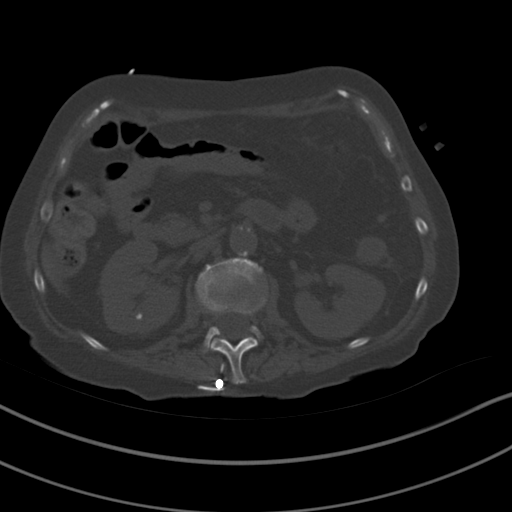
[im 52/63  soft-tissue]
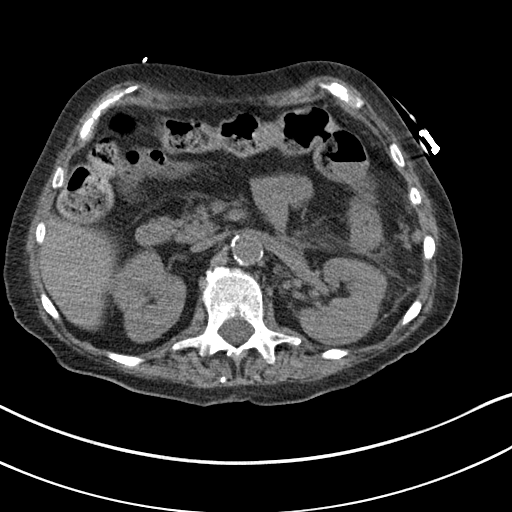
[im 52/63  lung]
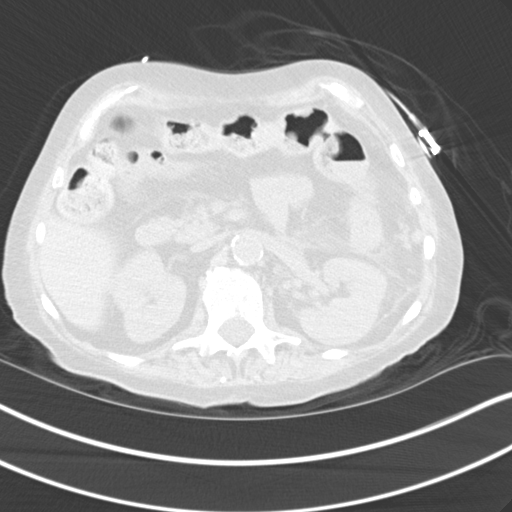
[im 57/63  soft-tissue]
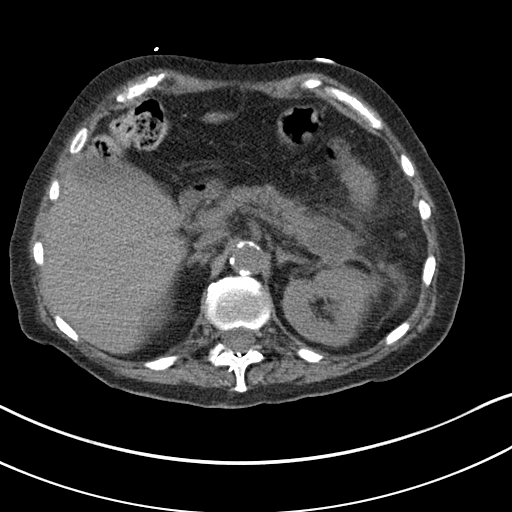
[im 57/63  lung]
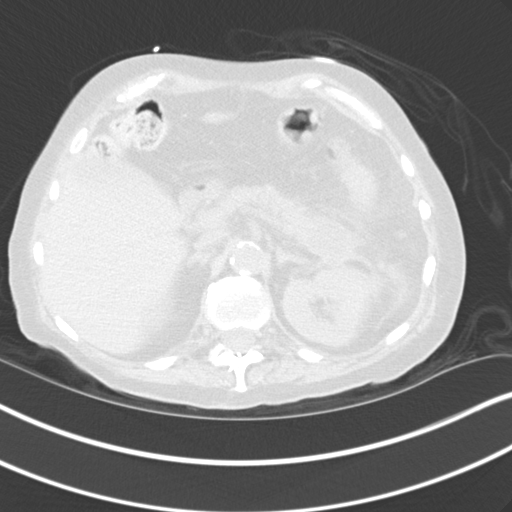

[11 of 32 positions shown; findings below may reference images not displayed]

EXAM:
CT-GUIDED DRAIN PLACEMENT WITHIN THE PERICOLONIC COLLECTION

MEDICATIONS:
The patient is currently admitted to the hospital and receiving
intravenous antibiotics.

ANESTHESIA/SEDATION:
2.0 mg IV Versed 75 mcg IV Fentanyl

Moderate Sedation Time:  31 minutes

The patient was continuously monitored during the procedure by the
interventional radiology nurse under my direct supervision.

COMPLICATIONS:
None immediate.
PROCEDURE:
CT images through the abdomen were obtained. The large gas
collection communicating the descending colon in the left anterior
abdomen was targeted. The left upper abdomen was prepped with
chlorhexidine and sterile field was created. Skin and soft tissues
anesthetized with 1% lidocaine. 18 gauge trocar needle was directed
into the extraluminal collection. Air was coming out of the needle.
Stiff Amplatz wire preferentially went into the adjacent colon. As a
result, this access was removed. Skin was anesthetized slightly more
medial and cephalad. Needle was directed into the extraluminal gas
collection with CT guidance. Again, the wire preferentially went
into the colon. Tract was dilated to accommodate a 12 French
multipurpose drain. Initially, the tube was extending into the
colon. The tube was pulled back so that the pigtail was outside the
colon lumen. Catheter was sutured to skin and attached to a gravity
bag.
FINDINGS: Again noted is a contained perforation involving the anterior wall
of the descending colon. The size the anterior colonic wall defect
measures close to 3 cm. Again noted is contained gas extending
anterior and cephalad to the descending colon. Drainage catheter was
placed within the extraluminal collection. Drain was pulled out of
the colon lumen and positioned just anterior to the colon.
IMPRESSION: CT-guided placement of a drainage catheter within the large
extraluminal gas collection in the left abdomen. Findings are
compatible with contained perforation of the descending colon.
